# Patient Record
Sex: Male | Born: 1940 | Race: White | Hispanic: No | Smoking: Former smoker
Health system: Southern US, Community
[De-identification: ages and names within clinical notes are randomized; demographics above are authoritative.]

## PROBLEM LIST (undated history)

## (undated) DIAGNOSIS — E669 Obesity, unspecified: Secondary | ICD-10-CM

## (undated) DIAGNOSIS — E119 Type 2 diabetes mellitus without complications: Secondary | ICD-10-CM

## (undated) DIAGNOSIS — M1990 Unspecified osteoarthritis, unspecified site: Secondary | ICD-10-CM

## (undated) DIAGNOSIS — M109 Gout, unspecified: Secondary | ICD-10-CM

## (undated) DIAGNOSIS — K5792 Diverticulitis of intestine, part unspecified, without perforation or abscess without bleeding: Secondary | ICD-10-CM

## (undated) DIAGNOSIS — J189 Pneumonia, unspecified organism: Secondary | ICD-10-CM

## (undated) DIAGNOSIS — C801 Malignant (primary) neoplasm, unspecified: Secondary | ICD-10-CM

## (undated) DIAGNOSIS — I10 Essential (primary) hypertension: Secondary | ICD-10-CM

## (undated) DIAGNOSIS — C61 Malignant neoplasm of prostate: Secondary | ICD-10-CM

## (undated) DIAGNOSIS — E7800 Pure hypercholesterolemia, unspecified: Secondary | ICD-10-CM

## (undated) HISTORY — PX: KNEE ARTHROSCOPY: SHX127

## (undated) HISTORY — PX: SKIN CANCER EXCISION: SHX779

---

## 1989-06-19 HISTORY — PX: INCISION AND DRAINAGE PERIRECTAL ABSCESS: SHX1804

## 1994-10-19 HISTORY — PX: PROSTATE SURGERY: SHX751

## 2002-02-23 ENCOUNTER — Ambulatory Visit (HOSPITAL_COMMUNITY): Admission: RE | Admit: 2002-02-23 | Discharge: 2002-02-23 | Payer: Self-pay | Admitting: Specialist

## 2002-03-20 ENCOUNTER — Encounter: Admission: RE | Admit: 2002-03-20 | Discharge: 2002-04-11 | Payer: Self-pay | Admitting: Specialist

## 2002-11-20 ENCOUNTER — Ambulatory Visit (HOSPITAL_COMMUNITY): Admission: RE | Admit: 2002-11-20 | Discharge: 2002-11-20 | Payer: Self-pay | Admitting: Gastroenterology

## 2003-06-18 ENCOUNTER — Encounter: Payer: Self-pay | Admitting: General Surgery

## 2003-06-18 ENCOUNTER — Inpatient Hospital Stay (HOSPITAL_COMMUNITY): Admission: RE | Admit: 2003-06-18 | Discharge: 2003-06-19 | Payer: Self-pay | Admitting: General Surgery

## 2004-11-20 ENCOUNTER — Ambulatory Visit (HOSPITAL_COMMUNITY): Admission: RE | Admit: 2004-11-20 | Discharge: 2004-11-20 | Payer: Self-pay | Admitting: Specialist

## 2004-12-16 ENCOUNTER — Ambulatory Visit (HOSPITAL_COMMUNITY): Admission: RE | Admit: 2004-12-16 | Discharge: 2004-12-16 | Payer: Self-pay | Admitting: Specialist

## 2005-01-27 ENCOUNTER — Ambulatory Visit: Admission: RE | Admit: 2005-01-27 | Discharge: 2005-04-27 | Payer: Self-pay | Admitting: Radiation Oncology

## 2005-05-04 ENCOUNTER — Ambulatory Visit: Admission: RE | Admit: 2005-05-04 | Discharge: 2005-05-04 | Payer: Self-pay | Admitting: Radiation Oncology

## 2014-06-11 ENCOUNTER — Telehealth: Payer: Self-pay | Admitting: Gastroenterology

## 2014-06-11 ENCOUNTER — Other Ambulatory Visit: Payer: Self-pay | Admitting: Gastroenterology

## 2014-06-11 DIAGNOSIS — R109 Unspecified abdominal pain: Secondary | ICD-10-CM

## 2014-06-11 NOTE — Telephone Encounter (Signed)
Patient has been suffering from abdominal pain for 2-3 weeks and was seen by Dr. Collene Mares.  He called this morning to say that abdominal pain continues.  I instructed the patient to call Dr. Lorie Apley office upon opening which is approximately in 40 minutes

## 2014-06-12 ENCOUNTER — Ambulatory Visit
Admission: RE | Admit: 2014-06-12 | Discharge: 2014-06-12 | Disposition: A | Discharge status: Home / Self Care | Payer: Medicare HMO | Source: Ambulatory Visit | Attending: Gastroenterology | Admitting: Gastroenterology

## 2014-06-12 ENCOUNTER — Encounter (HOSPITAL_COMMUNITY): Payer: Self-pay | Admitting: Emergency Medicine

## 2014-06-12 ENCOUNTER — Emergency Department (HOSPITAL_COMMUNITY)
Admission: EM | Admit: 2014-06-12 | Discharge: 2014-06-12 | Discharge status: Against Medical Advice | Payer: Medicare HMO | Attending: Emergency Medicine | Admitting: Emergency Medicine

## 2014-06-12 DIAGNOSIS — E669 Obesity, unspecified: Secondary | ICD-10-CM | POA: Diagnosis not present

## 2014-06-12 DIAGNOSIS — R1084 Generalized abdominal pain: Secondary | ICD-10-CM | POA: Insufficient documentation

## 2014-06-12 DIAGNOSIS — Z87891 Personal history of nicotine dependence: Secondary | ICD-10-CM | POA: Insufficient documentation

## 2014-06-12 DIAGNOSIS — R109 Unspecified abdominal pain: Secondary | ICD-10-CM

## 2014-06-12 DIAGNOSIS — I10 Essential (primary) hypertension: Secondary | ICD-10-CM | POA: Diagnosis not present

## 2014-06-12 DIAGNOSIS — R6889 Other general symptoms and signs: Secondary | ICD-10-CM | POA: Insufficient documentation

## 2014-06-12 DIAGNOSIS — E119 Type 2 diabetes mellitus without complications: Secondary | ICD-10-CM | POA: Insufficient documentation

## 2014-06-12 HISTORY — DX: Obesity, unspecified: E66.9

## 2014-06-12 HISTORY — DX: Essential (primary) hypertension: I10

## 2014-06-12 HISTORY — DX: Pure hypercholesterolemia, unspecified: E78.00

## 2014-06-12 LAB — CBC WITH DIFFERENTIAL/PLATELET
Basophils Absolute: 0 10*3/uL (ref 0.0–0.1)
Basophils Relative: 0 % (ref 0–1)
Eosinophils Absolute: 0.2 10*3/uL (ref 0.0–0.7)
Eosinophils Relative: 2 % (ref 0–5)
HCT: 42.2 % (ref 39.0–52.0)
Hemoglobin: 14.8 g/dL (ref 13.0–17.0)
Lymphocytes Relative: 20 % (ref 12–46)
Lymphs Abs: 2.4 10*3/uL (ref 0.7–4.0)
MCH: 33.2 pg (ref 26.0–34.0)
MCHC: 35.1 g/dL (ref 30.0–36.0)
MCV: 94.6 fL (ref 78.0–100.0)
Monocytes Absolute: 1.3 10*3/uL — ABNORMAL HIGH (ref 0.1–1.0)
Monocytes Relative: 11 % (ref 3–12)
Neutro Abs: 8.1 10*3/uL — ABNORMAL HIGH (ref 1.7–7.7)
Neutrophils Relative %: 67 % (ref 43–77)
Platelets: 217 10*3/uL (ref 150–400)
RBC: 4.46 MIL/uL (ref 4.22–5.81)
RDW: 12.7 % (ref 11.5–15.5)
WBC: 12.1 10*3/uL — ABNORMAL HIGH (ref 4.0–10.5)

## 2014-06-12 LAB — COMPREHENSIVE METABOLIC PANEL
ALT: 20 U/L (ref 0–53)
AST: 31 U/L (ref 0–37)
Albumin: 3.5 g/dL (ref 3.5–5.2)
Alkaline Phosphatase: 96 U/L (ref 39–117)
Anion gap: 19 — ABNORMAL HIGH (ref 5–15)
BUN: 46 mg/dL — ABNORMAL HIGH (ref 6–23)
CO2: 18 mEq/L — ABNORMAL LOW (ref 19–32)
Calcium: 9.2 mg/dL (ref 8.4–10.5)
Chloride: 95 mEq/L — ABNORMAL LOW (ref 96–112)
Creatinine, Ser: 3.41 mg/dL — ABNORMAL HIGH (ref 0.50–1.35)
GFR calc Af Amer: 19 mL/min — ABNORMAL LOW (ref >90)
GFR calc non Af Amer: 16 mL/min — ABNORMAL LOW (ref >90)
Glucose, Bld: 150 mg/dL — ABNORMAL HIGH (ref 70–99)
Potassium: 4.5 mEq/L (ref 3.7–5.3)
Sodium: 132 mEq/L — ABNORMAL LOW (ref 137–147)
Total Bilirubin: 0.6 mg/dL (ref 0.3–1.2)
Total Protein: 7.5 g/dL (ref 6.0–8.3)

## 2014-06-12 LAB — LIPASE, BLOOD: Lipase: 39 U/L (ref 11–59)

## 2014-06-12 NOTE — ED Notes (Signed)
Pt reports going to GI dr today due to generalized abd pain x 1 week. Pt was sent for a ct scan but unable to complete due to elevated renal labs. Pt reports not drinking a lot of fluids over the past week but denies any n/v/d. No other complaints and no acute distress noted at triage.

## 2014-06-12 NOTE — ED Notes (Signed)
Pt states that his wife cannot drive after dark. States that he will have to leave.

## 2014-06-13 ENCOUNTER — Emergency Department (HOSPITAL_COMMUNITY): Payer: Medicare HMO

## 2014-06-13 ENCOUNTER — Observation Stay (HOSPITAL_COMMUNITY): Payer: Medicare HMO

## 2014-06-13 ENCOUNTER — Encounter (HOSPITAL_COMMUNITY): Payer: Self-pay | Admitting: Emergency Medicine

## 2014-06-13 ENCOUNTER — Observation Stay (HOSPITAL_COMMUNITY)
Admission: EM | Admit: 2014-06-13 | Discharge: 2014-06-14 | Disposition: A | Discharge status: Home / Self Care | Payer: Medicare HMO | Attending: Internal Medicine | Admitting: Internal Medicine

## 2014-06-13 DIAGNOSIS — R1030 Lower abdominal pain, unspecified: Secondary | ICD-10-CM

## 2014-06-13 DIAGNOSIS — Z8546 Personal history of malignant neoplasm of prostate: Secondary | ICD-10-CM | POA: Diagnosis not present

## 2014-06-13 DIAGNOSIS — E860 Dehydration: Secondary | ICD-10-CM | POA: Diagnosis not present

## 2014-06-13 DIAGNOSIS — G8929 Other chronic pain: Secondary | ICD-10-CM | POA: Insufficient documentation

## 2014-06-13 DIAGNOSIS — R1032 Left lower quadrant pain: Secondary | ICD-10-CM | POA: Diagnosis present

## 2014-06-13 DIAGNOSIS — M1289 Other specific arthropathies, not elsewhere classified, multiple sites: Secondary | ICD-10-CM | POA: Insufficient documentation

## 2014-06-13 DIAGNOSIS — I10 Essential (primary) hypertension: Secondary | ICD-10-CM | POA: Diagnosis not present

## 2014-06-13 DIAGNOSIS — N179 Acute kidney failure, unspecified: Principal | ICD-10-CM | POA: Insufficient documentation

## 2014-06-13 DIAGNOSIS — M109 Gout, unspecified: Secondary | ICD-10-CM | POA: Insufficient documentation

## 2014-06-13 DIAGNOSIS — E7800 Pure hypercholesterolemia, unspecified: Secondary | ICD-10-CM | POA: Insufficient documentation

## 2014-06-13 DIAGNOSIS — E119 Type 2 diabetes mellitus without complications: Secondary | ICD-10-CM | POA: Diagnosis not present

## 2014-06-13 DIAGNOSIS — E669 Obesity, unspecified: Secondary | ICD-10-CM | POA: Insufficient documentation

## 2014-06-13 DIAGNOSIS — Z8719 Personal history of other diseases of the digestive system: Secondary | ICD-10-CM | POA: Insufficient documentation

## 2014-06-13 DIAGNOSIS — R1084 Generalized abdominal pain: Secondary | ICD-10-CM | POA: Diagnosis not present

## 2014-06-13 DIAGNOSIS — M25519 Pain in unspecified shoulder: Secondary | ICD-10-CM | POA: Insufficient documentation

## 2014-06-13 DIAGNOSIS — E785 Hyperlipidemia, unspecified: Secondary | ICD-10-CM | POA: Insufficient documentation

## 2014-06-13 DIAGNOSIS — R109 Unspecified abdominal pain: Secondary | ICD-10-CM

## 2014-06-13 HISTORY — DX: Unspecified osteoarthritis, unspecified site: M19.90

## 2014-06-13 HISTORY — DX: Malignant neoplasm of prostate: C61

## 2014-06-13 HISTORY — DX: Type 2 diabetes mellitus without complications: E11.9

## 2014-06-13 HISTORY — DX: Gout, unspecified: M10.9

## 2014-06-13 HISTORY — DX: Malignant (primary) neoplasm, unspecified: C80.1

## 2014-06-13 HISTORY — DX: Pneumonia, unspecified organism: J18.9

## 2014-06-13 LAB — CBC WITH DIFFERENTIAL/PLATELET
BASOS ABS: 0 10*3/uL (ref 0.0–0.1)
BASOS PCT: 0 % (ref 0–1)
EOS PCT: 1 % (ref 0–5)
Eosinophils Absolute: 0.1 10*3/uL (ref 0.0–0.7)
HEMATOCRIT: 41.6 % (ref 39.0–52.0)
HEMOGLOBIN: 14.4 g/dL (ref 13.0–17.0)
Lymphocytes Relative: 12 % (ref 12–46)
Lymphs Abs: 1.2 10*3/uL (ref 0.7–4.0)
MCH: 32.9 pg (ref 26.0–34.0)
MCHC: 34.6 g/dL (ref 30.0–36.0)
MCV: 95 fL (ref 78.0–100.0)
MONO ABS: 1.2 10*3/uL — AB (ref 0.1–1.0)
MONOS PCT: 12 % (ref 3–12)
NEUTROS ABS: 7.6 10*3/uL (ref 1.7–7.7)
Neutrophils Relative %: 75 % (ref 43–77)
Platelets: 171 10*3/uL (ref 150–400)
RBC: 4.38 MIL/uL (ref 4.22–5.81)
RDW: 12.6 % (ref 11.5–15.5)
WBC: 10.1 10*3/uL (ref 4.0–10.5)

## 2014-06-13 LAB — URINALYSIS, ROUTINE W REFLEX MICROSCOPIC
BILIRUBIN URINE: NEGATIVE
Bilirubin Urine: NEGATIVE
GLUCOSE, UA: NEGATIVE mg/dL
Glucose, UA: NEGATIVE mg/dL
HGB URINE DIPSTICK: NEGATIVE
Hgb urine dipstick: NEGATIVE
KETONES UR: 15 mg/dL — AB
KETONES UR: 15 mg/dL — AB
LEUKOCYTES UA: NEGATIVE
Leukocytes, UA: NEGATIVE
NITRITE: NEGATIVE
Nitrite: NEGATIVE
PROTEIN: NEGATIVE mg/dL
PROTEIN: NEGATIVE mg/dL
Specific Gravity, Urine: 1.016 (ref 1.005–1.030)
Specific Gravity, Urine: 1.016 (ref 1.005–1.030)
UROBILINOGEN UA: 0.2 mg/dL (ref 0.0–1.0)
Urobilinogen, UA: 0.2 mg/dL (ref 0.0–1.0)
pH: 5 (ref 5.0–8.0)
pH: 5 (ref 5.0–8.0)

## 2014-06-13 LAB — GLUCOSE, CAPILLARY
GLUCOSE-CAPILLARY: 174 mg/dL — AB (ref 70–99)
Glucose-Capillary: 117 mg/dL — ABNORMAL HIGH (ref 70–99)
Glucose-Capillary: 142 mg/dL — ABNORMAL HIGH (ref 70–99)

## 2014-06-13 LAB — LIPID PANEL
Cholesterol: 165 mg/dL (ref 0–200)
HDL: 52 mg/dL (ref >39)
LDL CALC: 83 mg/dL (ref 0–99)
Total CHOL/HDL Ratio: 3.2 RATIO
Triglycerides: 148 mg/dL (ref <150)
VLDL: 30 mg/dL (ref 0–40)

## 2014-06-13 LAB — CREATININE, SERUM
Creatinine, Ser: 1.81 mg/dL — ABNORMAL HIGH (ref 0.50–1.35)
GFR calc Af Amer: 41 mL/min — ABNORMAL LOW (ref >90)
GFR calc non Af Amer: 35 mL/min — ABNORMAL LOW (ref >90)

## 2014-06-13 LAB — CBC
HEMATOCRIT: 42.5 % (ref 39.0–52.0)
Hemoglobin: 14.4 g/dL (ref 13.0–17.0)
MCH: 33.1 pg (ref 26.0–34.0)
MCHC: 33.9 g/dL (ref 30.0–36.0)
MCV: 97.7 fL (ref 78.0–100.0)
Platelets: 183 10*3/uL (ref 150–400)
RBC: 4.35 MIL/uL (ref 4.22–5.81)
RDW: 12.7 % (ref 11.5–15.5)
WBC: 10.4 10*3/uL (ref 4.0–10.5)

## 2014-06-13 LAB — COMPREHENSIVE METABOLIC PANEL
ALBUMIN: 3.2 g/dL — AB (ref 3.5–5.2)
ALT: 18 U/L (ref 0–53)
ANION GAP: 18 — AB (ref 5–15)
AST: 21 U/L (ref 0–37)
Alkaline Phosphatase: 97 U/L (ref 39–117)
BILIRUBIN TOTAL: 0.8 mg/dL (ref 0.3–1.2)
BUN: 43 mg/dL — AB (ref 6–23)
CALCIUM: 8.9 mg/dL (ref 8.4–10.5)
CHLORIDE: 97 meq/L (ref 96–112)
CO2: 18 meq/L — AB (ref 19–32)
CREATININE: 2.28 mg/dL — AB (ref 0.50–1.35)
GFR calc Af Amer: 32 mL/min — ABNORMAL LOW (ref >90)
GFR, EST NON AFRICAN AMERICAN: 27 mL/min — AB (ref >90)
Glucose, Bld: 133 mg/dL — ABNORMAL HIGH (ref 70–99)
Potassium: 4.3 mEq/L (ref 3.7–5.3)
Sodium: 133 mEq/L — ABNORMAL LOW (ref 137–147)
Total Protein: 7 g/dL (ref 6.0–8.3)

## 2014-06-13 LAB — LIPASE, BLOOD: LIPASE: 117 U/L — AB (ref 11–59)

## 2014-06-13 LAB — I-STAT TROPONIN, ED: TROPONIN I, POC: 0.03 ng/mL (ref 0.00–0.08)

## 2014-06-13 LAB — SODIUM, URINE, RANDOM: SODIUM UR: 92 meq/L

## 2014-06-13 LAB — I-STAT CG4 LACTIC ACID, ED: LACTIC ACID, VENOUS: 1.32 mmol/L (ref 0.5–2.2)

## 2014-06-13 MED ORDER — ACETAMINOPHEN 325 MG PO TABS
650.0000 mg | ORAL_TABLET | Freq: Four times a day (QID) | ORAL | Status: DC | PRN
Start: 2014-06-13 — End: 2014-06-14

## 2014-06-13 MED ORDER — HYDROCODONE-ACETAMINOPHEN 5-325 MG PO TABS
1.0000 | ORAL_TABLET | ORAL | Status: DC | PRN
  Administered 2014-06-13 – 2014-06-14 (×3): 1 via ORAL
  Filled 2014-06-13 (×3): qty 1

## 2014-06-13 MED ORDER — ENOXAPARIN SODIUM 30 MG/0.3ML ~~LOC~~ SOLN
30.0000 mg | SUBCUTANEOUS | Status: DC
  Administered 2014-06-13: 30 mg via SUBCUTANEOUS
  Filled 2014-06-13 (×2): qty 0.3

## 2014-06-13 MED ORDER — POLYETHYLENE GLYCOL 3350 17 G PO PACK
17.0000 g | PACK | Freq: Every day | ORAL | Status: DC | PRN
  Filled 2014-06-13: qty 1

## 2014-06-13 MED ORDER — ONDANSETRON HCL 4 MG/2ML IJ SOLN: 4.0000 mg | Freq: Four times a day (QID) | INTRAMUSCULAR | Status: DC | PRN

## 2014-06-13 MED ORDER — HYDROMORPHONE HCL PF 1 MG/ML IJ SOLN
1.0000 mg | INTRAMUSCULAR | Status: AC | PRN
  Filled 2014-06-13: qty 1

## 2014-06-13 MED ORDER — MORPHINE SULFATE 4 MG/ML IJ SOLN
4.0000 mg | Freq: Once | INTRAMUSCULAR | Status: AC
  Administered 2014-06-13: 4 mg via INTRAVENOUS
  Filled 2014-06-13: qty 1

## 2014-06-13 MED ORDER — ALUM & MAG HYDROXIDE-SIMETH 200-200-20 MG/5ML PO SUSP: 30.0000 mL | Freq: Four times a day (QID) | ORAL | Status: DC | PRN

## 2014-06-13 MED ORDER — SENNOSIDES-DOCUSATE SODIUM 8.6-50 MG PO TABS
1.0000 | ORAL_TABLET | Freq: Every day | ORAL | Status: DC
  Administered 2014-06-13: 1 via ORAL
  Filled 2014-06-13: qty 1

## 2014-06-13 MED ORDER — PANTOPRAZOLE SODIUM 40 MG PO TBEC
40.0000 mg | DELAYED_RELEASE_TABLET | Freq: Every day | ORAL | Status: DC
  Administered 2014-06-13 – 2014-06-14 (×2): 40 mg via ORAL
  Filled 2014-06-13 (×2): qty 1

## 2014-06-13 MED ORDER — INSULIN ASPART 100 UNIT/ML ~~LOC~~ SOLN
0.0000 [IU] | Freq: Three times a day (TID) | SUBCUTANEOUS | Status: DC
  Administered 2014-06-14: 1 [IU] via SUBCUTANEOUS
  Administered 2014-06-14: 3 [IU] via SUBCUTANEOUS

## 2014-06-13 MED ORDER — ONDANSETRON HCL 4 MG/2ML IJ SOLN
4.0000 mg | Freq: Once | INTRAMUSCULAR | Status: AC
  Administered 2014-06-13: 4 mg via INTRAVENOUS
  Filled 2014-06-13: qty 2

## 2014-06-13 MED ORDER — INSULIN ASPART 100 UNIT/ML ~~LOC~~ SOLN: 0.0000 [IU] | Freq: Every day | SUBCUTANEOUS | Status: DC

## 2014-06-13 MED ORDER — SODIUM CHLORIDE 0.9 % IV BOLUS (SEPSIS)
1000.0000 mL | Freq: Once | INTRAVENOUS | Status: AC
  Administered 2014-06-13: 1000 mL via INTRAVENOUS

## 2014-06-13 MED ORDER — HYDROMORPHONE HCL PF 1 MG/ML IJ SOLN
1.0000 mg | INTRAMUSCULAR | Status: DC | PRN
  Administered 2014-06-13: 1 mg via INTRAVENOUS

## 2014-06-13 MED ORDER — ONDANSETRON HCL 4 MG PO TABS: 4.0000 mg | ORAL_TABLET | Freq: Four times a day (QID) | ORAL | Status: DC | PRN

## 2014-06-13 MED ORDER — SODIUM CHLORIDE 0.9 % IV SOLN
INTRAVENOUS | Status: DC
  Administered 2014-06-13: 100 mL/h via INTRAVENOUS
  Administered 2014-06-14: 03:00:00 via INTRAVENOUS

## 2014-06-13 MED ORDER — ONDANSETRON HCL 4 MG/2ML IJ SOLN: 4.0000 mg | Freq: Three times a day (TID) | INTRAMUSCULAR | Status: AC | PRN

## 2014-06-13 MED ORDER — ACETAMINOPHEN 650 MG RE SUPP: 650.0000 mg | Freq: Four times a day (QID) | RECTAL | Status: DC | PRN

## 2014-06-13 NOTE — Consult Note (Signed)
Reason for Consult: Abdominal pain. Referring Physician: [facility]-Dr. Danton Palmateer is an 73 y.o. male.  HPI: 73 year old, white male, with multiple medical issues listed below, admitted for abnormal renal function noted incidentally when a BUN/Creatinine were checked before his scheduled CT scan yesterday. He claims he has had crampy pain in the suprapubic region for several weeks now with a significantly decreased appetite. He claims he has lost 15 lbs during this time. He is unable to describe any specific relieving or aggravating factors with regards to the pain. A non-contrasted CT scan done today reveals no acute abnormalities. 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 His last colonoscopy done in September 2014 revealed extensive pandiverticulosis.    Past Medical History  Diagnosis Date  . Obesity   . High cholesterol   . Hypertension   . Pneumonia 2014 X 1  . Type II diabetes mellitus   . Arthritis     "all my joints" (06/13/2014)  . Gout   . Prostate cancer   . 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 Prostate surgery  1996  . Incision and drainage perirectal abscess  1990's    "hemorrhoid"  . Knee arthroscopy Right ~ 2011  . Skin cancer excision  ~ 2005 X 2    "left foot"   History reviewed. No pertinent family history.  Social History:  reports that he quit smoking about 26 years ago. His smoking use included Cigarettes. He has a 48 pack-year smoking history. He has never used smokeless tobacco. He reports that he drinks about 16.8 ounces of alcohol per week. He reports that he does not use illicit drugs.  Allergies: No Known Allergies  Medications: I have reviewed the patient's current medications.  Results for orders placed during the hospital encounter of 06/13/14 (from the past 48 hour(s))  CBC WITH DIFFERENTIAL     Status: Abnormal   Collection Time     06/13/14 11:05 AM      Result Value Ref Range   WBC 10.1  4.0 - 10.5 K/uL   RBC 4.38  4.22 - 5.81 MIL/uL   Hemoglobin 14.4  13.0 - 17.0 g/dL   HCT 41.6  39.0 - 52.0 %   MCV 95.0  78.0 - 100.0 fL   MCH 32.9  26.0 - 34.0 pg   MCHC 34.6  30.0 - 36.0 g/dL   RDW 12.6  11.5 - 15.5 %   Platelets 171  150 - 400 K/uL   Neutrophils Relative % 75  43 - 77 %   Neutro Abs 7.6  1.7 - 7.7 K/uL   Lymphocytes Relative 12  12 - 46 %   Lymphs Abs 1.2  0.7 - 4.0 K/uL   Monocytes Relative 12  3 - 12 %   Monocytes Absolute 1.2 (*) 0.1 - 1.0 K/uL   Eosinophils Relative 1  0 - 5 %   Eosinophils Absolute 0.1  0.0 - 0.7 K/uL   Basophils Relative 0  0 - 1 %   Basophils Absolute 0.0  0.0 - 0.1 K/uL  COMPREHENSIVE METABOLIC PANEL     Status: Abnormal   Collection Time    06/13/14 11:05 AM      Result Value Ref Range   Sodium 133 (*) 137 - 147 mEq/L   Potassium 4.3  3.7 - 5.3 mEq/L   Chloride 97  96 - 112  mEq/L   CO2 18 (*) 19 - 32 mEq/L   Glucose, Bld 133 (*) 70 - 99 mg/dL   BUN 43 (*) 6 - 23 mg/dL   Creatinine, Ser 2.28 (*) 0.50 - 1.35 mg/dL   Calcium 8.9  8.4 - 10.5 mg/dL   Total Protein 7.0  6.0 - 8.3 g/dL   Albumin 3.2 (*) 3.5 - 5.2 g/dL   AST 21  0 - 37 U/L   ALT 18  0 - 53 U/L   Alkaline Phosphatase 97  39 - 117 U/L   Total Bilirubin 0.8  0.3 - 1.2 mg/dL   GFR calc non Af Amer 27 (*) >90 mL/min   GFR calc Af Amer 32 (*) >90 mL/min   Comment: (NOTE)     The eGFR has been calculated using the CKD EPI equation.     This calculation has not been validated in all clinical situations.     eGFR's persistently <90 mL/min signify possible Chronic Kidney     Disease.   Anion gap 18 (*) 5 - 15  I-STAT TROPOININ, ED     Status: None   Collection Time    06/13/14 11:10 AM      Result Value Ref Range   Troponin i, poc 0.03  0.00 - 0.08 ng/mL   Comment 3            Comment: Due to the release kinetics of cTnI,     a negative result within the first hours     of the onset of symptoms does not rule  out     myocardial infarction with certainty.     If myocardial infarction is still suspected,     repeat the test at appropriate intervals.  I-STAT CG4 LACTIC ACID, ED     Status: None   Collection Time    06/13/14 11:12 AM      Result Value Ref Range   Lactic Acid, Venous 1.32  0.5 - 2.2 mmol/L  URINALYSIS, ROUTINE W REFLEX MICROSCOPIC     Status: Abnormal   Collection Time    06/13/14 12:51 PM      Result Value Ref Range   Color, Urine AMBER (*) YELLOW   Comment: BIOCHEMICALS MAY BE AFFECTED BY COLOR   APPearance CLEAR  CLEAR   Specific Gravity, Urine 1.016  1.005 - 1.030   pH 5.0  5.0 - 8.0   Glucose, UA NEGATIVE  NEGATIVE mg/dL   Hgb urine dipstick NEGATIVE  NEGATIVE   Bilirubin Urine NEGATIVE  NEGATIVE   Ketones, ur 15 (*) NEGATIVE mg/dL   Protein, ur NEGATIVE  NEGATIVE mg/dL   Urobilinogen, UA 0.2  0.0 - 1.0 mg/dL   Nitrite NEGATIVE  NEGATIVE   Leukocytes, UA NEGATIVE  NEGATIVE   Comment: MICROSCOPIC NOT DONE ON URINES WITH NEGATIVE PROTEIN, BLOOD, LEUKOCYTES, NITRITE, OR GLUCOSE <1000 mg/dL.  SODIUM, URINE, RANDOM     Status: None   Collection Time    06/13/14 12:51 PM      Result Value Ref Range   Sodium, Ur 92    LIPASE, BLOOD     Status: Abnormal   Collection Time    06/13/14  1:01 PM      Result Value Ref Range   Lipase 117 (*) 11 - 59 U/L  GLUCOSE, CAPILLARY     Status: Abnormal   Collection Time    06/13/14  3:22 PM      Result Value Ref Range   Glucose-Capillary 117 (*)  70 - 99 mg/dL  CBC     Status: None   Collection Time    06/13/14  4:37 PM      Result Value Ref Range   WBC 10.4  4.0 - 10.5 K/uL   RBC 4.35  4.22 - 5.81 MIL/uL   Hemoglobin 14.4  13.0 - 17.0 g/dL   HCT 42.5  39.0 - 52.0 %   MCV 97.7  78.0 - 100.0 fL   MCH 33.1  26.0 - 34.0 pg   MCHC 33.9  30.0 - 36.0 g/dL   RDW 12.7  11.5 - 15.5 %   Platelets 183  150 - 400 K/uL  CREATININE, SERUM     Status: Abnormal   Collection Time    06/13/14  4:37 PM      Result Value Ref Range    Creatinine, Ser 1.81 (*) 0.50 - 1.35 mg/dL   GFR calc non Af Amer 35 (*) >90 mL/min   GFR calc Af Amer 41 (*) >90 mL/min   Comment: (NOTE)     The eGFR has been calculated using the CKD EPI equation.     This calculation has not been validated in all clinical situations.     eGFR's persistently <90 mL/min signify possible Chronic Kidney     Disease.  LIPID PANEL     Status: None   Collection Time    06/13/14  4:48 PM      Result Value Ref Range   Cholesterol 165  0 - 200 mg/dL   Triglycerides 148  <150 mg/dL   HDL 52  >39 mg/dL   Total CHOL/HDL Ratio 3.2     VLDL 30  0 - 40 mg/dL   LDL Cholesterol 83  0 - 99 mg/dL   Comment:            Total Cholesterol/HDL:CHD Risk     Coronary Heart Disease Risk Table                         Men   Women      1/2 Average Risk   3.4   3.3      Average Risk       5.0   4.4      2 X Average Risk   9.6   7.1      3 X Average Risk  23.4   11.0                Use the calculated Patient Ratio     above and the CHD Risk Table     to determine the patient's CHD Risk.                ATP III CLASSIFICATION (LDL):      <100     mg/dL   Optimal      100-129  mg/dL   Near or Above                        Optimal      130-159  mg/dL   Borderline      160-189  mg/dL   High      >190     mg/dL   Very High  GLUCOSE, CAPILLARY     Status: Abnormal   Collection Time    06/13/14  5:33 PM      Result Value Ref Range   Glucose-Capillary 142 (*) 70 - 99  mg/dL  URINALYSIS, ROUTINE W REFLEX MICROSCOPIC     Status: Abnormal   Collection Time    06/13/14  5:42 PM      Result Value Ref Range   Color, Urine YELLOW  YELLOW   APPearance CLEAR  CLEAR   Specific Gravity, Urine 1.016  1.005 - 1.030   pH 5.0  5.0 - 8.0   Glucose, UA NEGATIVE  NEGATIVE mg/dL   Hgb urine dipstick NEGATIVE  NEGATIVE   Bilirubin Urine NEGATIVE  NEGATIVE   Ketones, ur 15 (*) NEGATIVE mg/dL   Protein, ur NEGATIVE  NEGATIVE mg/dL   Urobilinogen, UA 0.2  0.0 - 1.0 mg/dL   Nitrite  NEGATIVE  NEGATIVE   Leukocytes, UA NEGATIVE  NEGATIVE   Comment: MICROSCOPIC NOT DONE ON URINES WITH NEGATIVE PROTEIN, BLOOD, LEUKOCYTES, NITRITE, OR GLUCOSE <1000 mg/dL.   Ct Abdomen Pelvis Wo Contrast  06/13/2014   CLINICAL DATA:  Abdominal pain for few weeks. Prostate cancer. Abdominal aortic aneurysm.  EXAM: CT ABDOMEN AND PELVIS WITHOUT CONTRAST  TECHNIQUE: Multidetector CT imaging of the abdomen and pelvis was performed following the standard protocol without IV contrast.  COMPARISON:  None.  FINDINGS: Musculoskeletal: No aggressive osseous lesions. Mild thoracic spondylosis.  Lung Bases: Atelectasis.  Liver: Unenhanced CT was performed per clinician order. Lack of IV contrast limits sensitivity and specificity, especially for evaluation of abdominal/pelvic solid viscera. Normal.  Spleen:  Normal.  Gallbladder:  Dependent biliary sludge.  No calcified stones.  Common bile duct:  Normal.  Pancreas:  Normal.  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  Kidneys: Bilateral renal cortical atrophy, likely representing medical renal disease. Interpolar simple LEFT renal cyst. This measures about 14 mm. No calculi. Renal vascular calcifications. Atherosclerosis. Both ureters appear within normal limits.  Stomach:  Normal.  Small bowel: Duodenum is within normal limits. There is no small bowel obstruction. Mild nonspecific stranding is present in the small bowel mesentery. Radiopaque density is present in the terminal ileum, likely representing residual barium contrast most of which has passed into the colon.  Colon: Normal appendix. Severe colonic diverticulosis. No diverticulitis.  Pelvic Genitourinary: Urinary bladder appears normal. Prostatectomy. No pelvic adenopathy.  Vasculature: Atherosclerosis. Coronary artery atherosclerosis is present. If office based assessment of coronary risk factors has not been performed, it is now recommended.  Fatty infiltration of the interatrial septum compatible with lipomatous hypertrophy of the interatrial septum. Abdominal aortic and visceral atherosclerosis without aneurysm.  Body Wall: Tiny fat containing periumbilical hernia.  IMPRESSION: 1. No acute abnormality. 2. Atherosclerosis and coronary artery disease. 3. Prostatectomy.   Electronically Signed   By: Dereck Ligas M.D.   On: 06/13/2014 11:50   US Renal  06/13/2014   CLINICAL DATA:  Renal insufficiency.  EXAM: RENAL/URINARY TRACT ULTRASOUND COMPLETE  COMPARISON:  CT scan from 06/13/2014.  FINDINGS: Right Kidney:  Length: 10.8 cm.  Cortical thinning.  No hydronephrosis.  Left Kidney:  Length: 13.2 cm. Cortical thinning. No hydronephrosis. 12 mm cyst identified in the interpolar region.  Bladder:  Nondistended bladder.  IMPRESSION: No evidence for hydronephrosis.   Electronically Signed   By: Misty Stanley M.D.   On: 06/13/2014 18:41   Dg Shoulder Left  06/13/2014   CLINICAL DATA:  Shoulder pain with no history of injury.  EXAM: LEFT SHOULDER - 2+ VIEW  COMPARISON:  None.  FINDINGS: Patient could not position for an axillary view. No evidence for fracture or dislocation on  this two view study. Acromioclavicular and coracoclavicular distances are preserved. Degenerative changes are noted at the [facility] joint. No worrisome lytic or sclerotic osseous abnormality.  IMPRESSION: AC joint degeneration.   Electronically Signed   By: Misty Stanley M.D.   On: 06/13/2014 13:53   Review of Systems  Constitutional: Positive for weight loss.  HENT: Negative.   Eyes: Negative.   Respiratory: Negative.   Cardiovascular: Negative.   Gastrointestinal: Positive for abdominal pain. Negative for heartburn, nausea, vomiting, diarrhea, constipation, blood in stool and melena.  Musculoskeletal: Positive for back pain.  Skin: Negative.   Neurological: Negative.   Endo/Heme/Allergies: Negative.   Psychiatric/Behavioral: Negative.    Blood pressure 156/73, pulse 83,  temperature 98 F (36.7 C), temperature source Oral, resp. rate 20, height 6' (1.829 m), weight 117.5 kg (259 lb 0.7 oz), SpO2 99.00%. Physical Exam  Constitutional: He is oriented to person, place, and time. He appears well-developed and well-nourished.  HENT:  Head: Normocephalic and atraumatic.  Eyes: Conjunctivae and EOM are normal. Pupils are equal, round, and reactive to light.  Neck: Normal range of motion. Neck supple.  Cardiovascular: Normal rate and regular rhythm.   Respiratory: Effort normal.  GI: Soft. Bowel sounds are normal. There is no hepatosplenomegaly. There is tenderness in the suprapubic area. There is no rigidity, no rebound, no guarding and no CVA tenderness.  Musculoskeletal: Normal range of motion.  Neurological: He is alert and oriented to person, place, and time.  Skin: Skin is warm and dry.  Psychiatric: He has a normal mood and affect. His behavior is normal. Judgment and thought content normal.   Assessment/Plan: 1) Abdominal pain with weight loss: I am not sure what is causing the pain in this case-he has extensive colonic diverticulosis with any evidence of diverticulitis. This may be mesenteric ischemia but with his present renal function we cannot do a mesenteric angiogram.   Aaryn Parrilla 06/13/2014, 6:56 PM

## 2014-06-13 NOTE — ED Notes (Signed)
Pt states that he has had mid abdominal pain and bilateral flank pain for 2 weeks. Pt was sent by dr Collene Mares for elevated kidney functions. Pt denies any changes in urination.

## 2014-06-13 NOTE — ED Provider Notes (Signed)
CSN: [id number]     Arrival date & time 06/13/14  1001 History   First MD Initiated Contact with Patient 06/13/14 1022     Chief Complaint  Patient presents with  . Abdominal Pain  . Abnormal Lab     (Consider location/radiation/quality/duration/timing/severity/associated sxs/prior Treatment) HPI  73 year old obese male with history of hypertension, high cholesterol, and non-insulin-dependent diabetes who presents complaining of abdominal pain and abnormal labs. History obtained through patient. He reports for the past one month he has been having persistent lower bowel pain. Describe pain as a sharp crampy sensation, nothing seems to make it better or worse. Pain has been progressive and now radiates to his bilateral flank for the past 2 weeks. Yesterday the pain did radiates to his left shoulder although he denies injuring his shoulder. He reports occasional nausea but none recently. He admits to having no appetite and hasn't been drinking or eating as usual. However denies eating and drinking worsen his abdominal pain. He was seen by his primary care doctor 3 weeks ago for a checkup at that time it was noted that his renal function was elevated, he was told to return in 3 months for a recheck. He has a known history of prostate cancer with a prostatectomy seizure done in 1997. He endorsed 15 pounds of weight loss within the past several months but it had to not eating and drinking. He was seen by his GI specialist last week for a checkup.  Patient was in the ED yesterday for the same complaint but has to leave early prior to being seen by a provider. His labs at that time is remarkable for a BUN of 46, creatinine of 3.41 concerning for kidney injury. He reports still urinating without any difficulty. Currently denies any fever, chills, headache, chest pain, shortness of breath, lightheadedness, dizziness, diaphoresis, dysuria, hematuria numbness, or rash. Patient is a former smoker.  Past Medical  History  Diagnosis Date  . Obesity   . High cholesterol   . Hypertension   . Diabetes mellitus without complication    History reviewed. No pertinent past surgical history. History reviewed. No pertinent family history. History  Substance Use Topics  . Smoking status: Former Research scientist (life sciences)  . Smokeless tobacco: Not on file  . Alcohol Use: Yes     Comment: occ    Review of Systems  All other systems reviewed and are negative.     Allergies  Review of patient's allergies indicates no known allergies.  Home Medications   Prior to Admission medications   Not on File   BP 104/60  Pulse 89  Temp(Src) 97.5 F (36.4 C) (Oral)  Resp 22  SpO2 98% Physical Exam  Constitutional: He is oriented to person, place, and time. He appears well-developed and well-nourished. No distress.  Moderately obese Caucasian male appears stated age, in no obvious distress.  HENT:  Head: Atraumatic.  Mouth/Throat: Oropharynx is clear and moist.  Eyes: Conjunctivae are normal.  Neck: Normal range of motion. Neck supple.  Cardiovascular: Normal rate, regular rhythm and intact distal pulses.   Pulmonary/Chest: Effort normal and breath sounds normal.  Abdominal: Soft. There is tenderness (Suprapubic tenderness on palpation without guarding or rebound tenderness.).  Genitourinary:  No CVA tenderness  Musculoskeletal:  5/5 Strength in all extremities with intact distal pulses  Neurological: He is alert and oriented to person, place, and time.  Skin: No rash noted.  Psychiatric: He has a normal mood and affect.    ED Course  Angiocath  insertion Date/Time: 06/13/2014 1:04 PM Performed by: Domenic Moras Authorized by: Domenic Moras Consent: Verbal consent obtained. Risks and benefits: risks, benefits and alternatives were discussed Consent given by: patient Patient understanding: patient states understanding of the procedure being performed Site marked: the operative site was marked Patient identity  confirmed: verbally with patient and arm band Time out: Immediately prior to procedure a "time out" was called to verify the correct patient, procedure, equipment, support staff and site/side marked as required. Preparation: Patient was prepped and draped in the usual sterile fashion. Local anesthesia used: no Patient sedated: no Patient tolerance: Patient tolerated the procedure well with no immediate complications. Comments: Insertion of 18 gauge angiocath to L brachial region for IVF using US guided.    (including critical care time)  10:45 AM Pt with abd pain, evidence of renal injury.  Plan for further evaluation and likely admission.  Care discussed with Dr. Darl Householder.  Dr. Darl Householder performed bedside US to evaluate for AAA but difficult to assess due to large body habitus.   12:24 PM BUN 43, Cr 2.28, with anion gap of 18.  Abd/pelvis CT without acute finding.  No evidence of AAA.  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  Plan for admission due to renal insufficiency and further evaluation for abd pain.  Care discussed with Dr. Darl Householder.    12:33 PM i have consulted with [facility], Dr. Tana Coast who agrees to admit pt to obs, team 10, tele, under her care.  Pt made aware of plan.    1:02 PM Pt's IV infiltrates.  I performed US guided peripheral IV under the supervision of Dr. Darl Householder.      Labs Review Labs Reviewed  CBC WITH DIFFERENTIAL - Abnormal; Notable for the following:    Monocytes Absolute 1.2 (*)    All other components within normal limits  COMPREHENSIVE METABOLIC PANEL - Abnormal; Notable for the following:    Sodium 133 (*)    CO2 18 (*)    Glucose, Bld 133 (*)    BUN 43 (*)    Creatinine, Ser 2.28 (*)    Albumin 3.2 (*)    GFR calc non Af Amer 27 (*)    GFR calc Af Amer 32 (*)    Anion gap 18 (*)    All other components within normal limits  URINALYSIS, ROUTINE W REFLEX MICROSCOPIC  I-STAT CG4 LACTIC ACID, ED  I-STAT TROPOININ, ED    Imaging Review Ct Abdomen Pelvis Wo  Contrast  06/13/2014   CLINICAL DATA:  Abdominal pain for few weeks. Prostate cancer. Abdominal aortic aneurysm.  EXAM: CT ABDOMEN AND PELVIS WITHOUT CONTRAST  TECHNIQUE: Multidetector CT imaging of the abdomen and pelvis was performed following the standard protocol without IV contrast.  COMPARISON:  None.  FINDINGS: Musculoskeletal: No aggressive osseous lesions. Mild thoracic spondylosis.  Lung Bases: Atelectasis.  Liver: Unenhanced CT was performed per clinician order. Lack of IV contrast limits sensitivity and specificity, especially for evaluation of abdominal/pelvic solid viscera. Normal.  Spleen:  Normal.  Gallbladder:  Dependent biliary sludge.  No calcified stones.  Common bile duct:  Normal.  Pancreas:  Normal.  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  Kidneys: Bilateral renal cortical atrophy, likely representing medical renal disease. Interpolar simple LEFT renal cyst. This measures about 14 mm. No calculi. Renal vascular calcifications. Atherosclerosis. Both ureters appear within normal limits.  Stomach:  Normal.  Small bowel: Duodenum is within normal limits.  There is no small bowel obstruction. Mild nonspecific stranding is present in the small bowel mesentery. Radiopaque density is present in the terminal ileum, likely representing residual barium contrast most of which has passed into the colon.  Colon: Normal appendix. Severe colonic diverticulosis. No diverticulitis.  Pelvic Genitourinary: Urinary bladder appears normal. Prostatectomy. No pelvic adenopathy.  Vasculature: Atherosclerosis. Coronary artery atherosclerosis is present. If office based assessment of coronary risk factors has not been performed, it is now recommended. Fatty infiltration of the interatrial septum compatible with lipomatous hypertrophy of the interatrial septum. Abdominal aortic and visceral atherosclerosis without aneurysm.  Body Wall: Tiny fat  containing periumbilical hernia.  IMPRESSION: 1. No acute abnormality. 2. Atherosclerosis and coronary artery disease. 3. Prostatectomy.   Electronically Signed   By: Dereck Ligas M.D.   On: 06/13/2014 11:50     EKG Interpretation   Date/Time:  Wednesday June 13 2014 10:47:00 EDT Ventricular Rate:  86 PR Interval:  171 QRS Duration: 99 QT Interval:  375 QTC Calculation: 448 R Axis:   -75 Text Interpretation:  Sinus rhythm Atrial premature complex LAD, consider  left anterior fascicular block Low voltage, precordial leads Abnormal  R-wave progression, early transition Borderline T abnormalities, anterior  leads LAFB new since previous  Confirmed by YAO  MD, DAVID ([id number]) on  06/13/2014 10:49:59 AM      MDM   Final diagnoses:  Acute renal injury  Lower abdominal pain    BP 104/60  Pulse 89  Temp(Src) 97.5 F (36.4 C) (Oral)  Resp 22  SpO2 98%  I have reviewed nursing notes and vital signs. I personally reviewed the imaging tests through PACS system  I reviewed available ER/hospitalization records thought the EMR     Domenic Moras, PA-C 06/13/14 [place], PA-C 06/14/14 [id number]

## 2014-06-13 NOTE — ED Notes (Signed)
MD at bedside attempting ultrasound IV

## 2014-06-13 NOTE — ED Notes (Signed)
Lab results given to Dr.Yao. 

## 2014-06-13 NOTE — Progress Notes (Signed)
Adarius Tigges [id number] Admission Data: 06/13/2014 4:28 PM Attending Provider: Mendel Corning, MD  JGO:[id number],MBTDH A, MD Consults/ Treatment Team:    Peder Allums is a 73 y.o. male patient admitted from ED awake, alert  & orientated  X 3,  Full Code, VSS - Blood pressure 109/59, pulse 72, temperature 98.7 F (37.1 C), temperature source Oral, resp. rate 18, height 6' (1.829 m), weight 117.5 kg (259 lb 0.7 oz), SpO2 99.00%., no c/o shortness of breath, no c/o chest pain, no distress noted. Tele #11.   IV site WDL:  Left A/C running NS at 166ml/hr.  Allergies:  No Known Allergies   Past Medical History  Diagnosis Date  . Obesity   . High cholesterol   . Hypertension   . Pneumonia 2014 X 1  . Type II diabetes mellitus   . Arthritis     "all my joints" (06/13/2014)  . Gout   . Prostate cancer   . 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 Information packet given to patient/family and safety video watched.  Admission INP armband ID verified with patient/family, and in place. SR up x 2, fall risk assessment complete with Patient and family verbalizing understanding of risks associated with falls. Pt verbalizes an understanding of how to use the call bell and to call for help before getting out of bed.  Skin, clean-dry- intact without evidence of bruising, or skin tears.   No evidence of skin break down noted on exam.     Will cont to monitor and assist as needed.  Dayle Points, RN 06/13/2014 4:28 PM

## 2014-06-13 NOTE — ED Notes (Signed)
Patient transported to X-ray 

## 2014-06-13 NOTE — H&P (Signed)
History and Physical       Hospital Admission Note Date: 06/13/2014  Patient name: Ian Estes Medical record number: [id number] Date of birth: 1940-12-20 Age: 73 y.o. Gender: male  PCP: Stephens Shire, MD  Primary gastroenterologist: Dr. Collene Mares  Chief Complaint:  Abdominal pain for last 2 weeks  HPI: Patient is a 73 year old male with history of diabetes, hypertension, hyperlipidemia, presented to ED with abdominal pain for last 2 weeks. The patient reports that abdominal pain is generalized, worse in periumbilical area and left lower quadrant, he has a history of prior diverticulitis. Patient denies any nausea, vomiting, diarrhea, fevers or chills. Patient reports that he saw his gastroenterologist, Dr. Collene Mares. CT abdomen and pelvis was done in ED which showed no acute abnormality. At the time of my encounter, patient reported abdominal pain had improved with morphine given in the ED. He reports a 15 pound weight loss in the last 6 month as he has not been eating "like he used to be". Patient denies any dysuria, hematuria or any flank pain The patient also endorses having left shoulder pain since morning.  Review of Systems:  Constitutional: Denies fever, chills, diaphoresis, poor appetite and fatigue.  HEENT: Denies photophobia, eye pain, redness, hearing loss, ear pain, congestion, sore throat, rhinorrhea, sneezing, mouth sores, trouble swallowing, neck pain, neck stiffness and tinnitus.   Respiratory: Denies SOB, DOE, cough, chest tightness,  and wheezing.   Cardiovascular: Denies chest pain, palpitations and leg swelling.  Gastrointestinal: Denies nausea, vomiting, + abdominal pain, denies diarrhea, constipation, blood in stool and abdominal distention.  Genitourinary: Denies dysuria, urgency, frequency, hematuria, flank pain and difficulty urinating.  Musculoskeletal: Denies myalgias, back pain, joint swelling, arthralgias and gait  problem.  Skin: Denies pallor, rash and wound.  Neurological: Denies dizziness, seizures, syncope, weakness, light-headedness, numbness and headaches.  Hematological: Denies adenopathy. Easy bruising, personal or family bleeding history  Psychiatric/Behavioral: Denies suicidal ideation, mood changes, confusion, nervousness, sleep disturbance and agitation  Past Medical History: Past Medical History  Diagnosis Date  . Obesity   . High cholesterol   . Hypertension   . Diabetes mellitus without complication    History reviewed. No pertinent past surgical history.  Medications: Prior to Admission medications   Not on File    Allergies:  No Known Allergies  Social History:  reports that he has quit smoking. He does not have any smokeless tobacco history on file. He reports that he drinks alcohol. He reports that he does not use illicit drugs.  Family History: History reviewed. No pertinent family history.  Physical Exam: Blood pressure 104/60, pulse 89, temperature 97.5 F (36.4 C), temperature source Oral, resp. rate 22, SpO2 98.00%. General: Alert, awake, oriented x3, in no acute distress. HEENT: normocephalic, atraumatic, anicteric sclera, pink conjunctiva, pupils equal and reactive to light and accomodation, oropharynx clear Neck: supple, no masses or lymphadenopathy, no goiter, no bruits  Heart: Regular rate and rhythm, without murmurs, rubs or gallops. Lungs: Clear to auscultation bilaterally, no wheezing, rales or rhonchi. Abdomen: Obese Soft, nontender, nondistended, positive bowel sounds, no masses. Extremities: No clubbing, cyanosis or edema with positive pedal pulses. Neuro: Grossly intact, no focal neurological deficits, strength 5/5 upper and lower extremities bilaterally Psych: alert and oriented x 3, normal mood and affect Skin: no rashes or lesions, warm and dry   LABS on Admission:  Basic Metabolic Panel:  Recent Labs Lab 06/12/14 1710 06/13/14 1105  NA  132* 133*  K 4.5 4.3  CL 95* 97  CO2 18*  18*  GLUCOSE 150* 133*  BUN 46* 43*  CREATININE 3.41* 2.28*  CALCIUM 9.2 8.9   Liver Function Tests:  Recent Labs Lab 06/12/14 1710 06/13/14 1105  AST 31 21  ALT 20 18  ALKPHOS 96 97  BILITOT 0.6 0.8  PROT 7.5 7.0  ALBUMIN 3.5 3.2*    Recent Labs Lab 06/12/14 1710  LIPASE 39   No results found for this basename: AMMONIA,  in the last 168 hours CBC:  Recent Labs Lab 06/12/14 1710 06/13/14 1105  WBC 12.1* 10.1  NEUTROABS 8.1* 7.6  HGB 14.8 14.4  HCT 42.2 41.6  MCV 94.6 95.0  PLT 217 171   Cardiac Enzymes: No results found for this basename: CKTOTAL, CKMB, CKMBINDEX, TROPONINI,  in the last 168 hours BNP: No components found with this basename: POCBNP,  CBG: No results found for this basename: GLUCAP,  in the last 168 hours   Radiological Exams on Admission: Ct Abdomen Pelvis Wo Contrast  06/13/2014   CLINICAL DATA:  Abdominal pain for few weeks. Prostate cancer. Abdominal aortic aneurysm.  EXAM: CT ABDOMEN AND PELVIS WITHOUT CONTRAST  TECHNIQUE: Multidetector CT imaging of the abdomen and pelvis was performed following the standard protocol without IV contrast.  COMPARISON:  None.  FINDINGS: Musculoskeletal: No aggressive osseous lesions. Mild thoracic spondylosis.  Lung Bases: Atelectasis.  Liver: Unenhanced CT was performed per clinician order. Lack of IV contrast limits sensitivity and specificity, especially for evaluation of abdominal/pelvic solid viscera. Normal.  Spleen:  Normal.  Gallbladder:  Dependent biliary sludge.  No calcified stones.  Common bile duct:  Normal.  Pancreas:  Normal.  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  Kidneys: Bilateral renal cortical atrophy, likely representing medical renal disease. Interpolar simple LEFT renal cyst. This measures about 14 mm. No calculi. Renal vascular calcifications. Atherosclerosis. Both ureters  appear within normal limits.  Stomach:  Normal.  Small bowel: Duodenum is within normal limits. There is no small bowel obstruction. Mild nonspecific stranding is present in the small bowel mesentery. Radiopaque density is present in the terminal ileum, likely representing residual barium contrast most of which has passed into the colon.  Colon: Normal appendix. Severe colonic diverticulosis. No diverticulitis.  Pelvic Genitourinary: Urinary bladder appears normal. Prostatectomy. No pelvic adenopathy.  Vasculature: Atherosclerosis. Coronary artery atherosclerosis is present. If office based assessment of coronary risk factors has not been performed, it is now recommended. Fatty infiltration of the interatrial septum compatible with lipomatous hypertrophy of the interatrial septum. Abdominal aortic and visceral atherosclerosis without aneurysm.  Body Wall: Tiny fat containing periumbilical hernia.  IMPRESSION: 1. No acute abnormality. 2. Atherosclerosis and coronary artery disease. 3. Prostatectomy.   Electronically Signed   By: Dereck Ligas M.D.   On: 06/13/2014 11:50    Assessment/Plan Principal Problem:   Acute renal failure likely due to dehydration: - Will admit for observation, placed on IV fluid hydration, will check renal ultrasound, urinalysis and culture, urine sodium for further workup - Obtain pharmacy consult for outpatient medication review    Active Problems: Chronic abdominal pain: Unclear etiology, patient does endorse prior history of diverticulitis. Denied taking any NSAIDs - CT abdomen and pelvis negative for acute abdominal pathology, check lipase, will discuss with gastroenterology, ? Endoscopy - For now place on full liquid diet  Left shoulder pain: - Will check left shoulder x-ray    Diabetes mellitus without complication - Obtain hemoglobin A1c, will place on sliding scale  insulin    Hypertension: Stable    Hyperlipidemia - Check lipid panel    Dehydration -  Continue IV fluid hydration   DVT prophylaxis: Lovenox   CODE STATUS: Full code   Family Communication: Admission, patients condition and plan of care including tests being ordered have been discussed with the patient and  wife who indicates understanding and agree with the plan and Code Status   Further plan will depend as patient's clinical course evolves and further radiologic and laboratory data become available.   Time Spent on Admission: One-hour   Laasya Peyton M.D. [facility] 06/13/2014, 1:01 PM Pager: [phone]  If 7PM-7AM, please contact night-coverage [email or web address] Password [email or web address]  **Disclaimer: This note was dictated with voice recognition software. Similar sounding words can inadvertently be transcribed and this note may contain transcription errors which may not have been corrected upon publication of note.**

## 2014-06-14 LAB — CBC
HCT: 39 % (ref 39.0–52.0)
HEMOGLOBIN: 12.9 g/dL — AB (ref 13.0–17.0)
MCH: 32.7 pg (ref 26.0–34.0)
MCHC: 33.1 g/dL (ref 30.0–36.0)
MCV: 98.7 fL (ref 78.0–100.0)
Platelets: 148 10*3/uL — ABNORMAL LOW (ref 150–400)
RBC: 3.95 MIL/uL — ABNORMAL LOW (ref 4.22–5.81)
RDW: 12.6 % (ref 11.5–15.5)
WBC: 8.6 10*3/uL (ref 4.0–10.5)

## 2014-06-14 LAB — BASIC METABOLIC PANEL
Anion gap: 15 (ref 5–15)
BUN: 31 mg/dL — AB (ref 6–23)
CO2: 18 mEq/L — ABNORMAL LOW (ref 19–32)
Calcium: 8.5 mg/dL (ref 8.4–10.5)
Chloride: 103 mEq/L (ref 96–112)
Creatinine, Ser: 1.48 mg/dL — ABNORMAL HIGH (ref 0.50–1.35)
GFR calc Af Amer: 52 mL/min — ABNORMAL LOW (ref >90)
GFR calc non Af Amer: 45 mL/min — ABNORMAL LOW (ref >90)
GLUCOSE: 120 mg/dL — AB (ref 70–99)
Potassium: 5 mEq/L (ref 3.7–5.3)
Sodium: 136 mEq/L — ABNORMAL LOW (ref 137–147)

## 2014-06-14 LAB — URINE CULTURE
CULTURE: NO GROWTH
Colony Count: NO GROWTH

## 2014-06-14 LAB — GLUCOSE, CAPILLARY
GLUCOSE-CAPILLARY: 216 mg/dL — AB (ref 70–99)
Glucose-Capillary: 133 mg/dL — ABNORMAL HIGH (ref 70–99)

## 2014-06-14 LAB — HEMOGLOBIN A1C
Hgb A1c MFr Bld: 6.3 % — ABNORMAL HIGH (ref <5.7)
Mean Plasma Glucose: 134 mg/dL — ABNORMAL HIGH (ref <117)

## 2014-06-14 MED ORDER — POLYETHYLENE GLYCOL 3350 17 G PO PACK: 17.0000 g | PACK | Freq: Every day | ORAL | Status: DC | PRN

## 2014-06-14 MED ORDER — PANTOPRAZOLE SODIUM 40 MG PO TBEC: 40.0000 mg | DELAYED_RELEASE_TABLET | Freq: Every day | ORAL | Status: DC

## 2014-06-14 MED ORDER — HYDROCODONE-ACETAMINOPHEN 5-325 MG PO TABS: 1.0000 | ORAL_TABLET | Freq: Four times a day (QID) | ORAL | Status: DC | PRN

## 2014-06-14 MED ORDER — ENOXAPARIN SODIUM 60 MG/0.6ML ~~LOC~~ SOLN
60.0000 mg | SUBCUTANEOUS | Status: DC
  Filled 2014-06-14: qty 0.6

## 2014-06-14 MED ORDER — PIOGLITAZONE HCL 30 MG PO TABS: 30.0000 mg | ORAL_TABLET | Freq: Every day | ORAL | Status: DC

## 2014-06-14 NOTE — Progress Notes (Signed)
Pharmacy: Re-lovenox  Patient's a 73 y.o M on lovenox 30mg  q24h for VTE prophylaxis.  Scr continues to trend down with 1.81 today (est crcl~47).   Hgb stable, no bleeding documented. Weight 117.5kg, height 6 ft, BMI>30  Plan: 1) increase lovenox dose to 60mg  SQ daily (~0.5mg /kg/day-- adjusted dose for high BMI) d/t improving renal function and high BMI 2) pharmacy will sign off for now.  Will re-adjust if crcl<30, has bleeding side effects, or platelets decreased more than 50% from baseline  Dia Sitter, PharmD, BCPS

## 2014-06-14 NOTE — Discharge Summary (Signed)
PATIENT DETAILS Name: Ian Estes Age: 73 y.o. Sex: male Date of Birth: [birth year] MRN: [id number]. Admit Date: 06/13/2014 Admitting Physician: Mendel Corning, MD KGY:JEHUDJS,HFWYO A, MD  Recommendations for Outpatient Follow-up:  1. Please repeat BMET at next visit 2. Will require further workup as outpatient for persistent abdominal pain-will need follow up with Dr Collene Mares 3. Have stopped HCTZ, lisinopril given acute renal failure 4. Have stopped metformin given mild residual renal failure, and possible need for arteriogram in near future (see below)  PRIMARY DISCHARGE DIAGNOSIS:  Principal Problem:   Acute renal failure Active Problems:   Diabetes mellitus without complication   Hypertension   Chronic abdominal pain   Hyperlipidemia   Dehydration      PAST MEDICAL HISTORY: Past Medical History  Diagnosis Date  . Obesity   . High cholesterol   . Hypertension   . Pneumonia 2014 X 1  . Type II diabetes mellitus   . Arthritis     "all my joints" (06/13/2014)  . Gout   . Prostate cancer   . 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     amLODipine 5 MG tablet  Commonly known as:  NORVASC  Take 5 mg by mouth daily.     HYDROcodone-acetaminophen 5-325 MG per tablet  Commonly known as:  NORCO/VICODIN  Take 1 tablet by mouth every 6 (six) hours as needed for moderate pain.     pantoprazole 40 MG tablet  Commonly known as:  PROTONIX  Take 1 tablet (40 mg total) by mouth daily at 6 (six) AM.     pioglitazone 30 MG tablet  Commonly known as:   ACTOS  Take 1 tablet (30 mg total) by mouth daily.     polyethylene glycol packet  Commonly known as:  MIRALAX / GLYCOLAX  Take 17 g by mouth daily as needed for moderate constipation.     simvastatin 40 MG tablet  Commonly known as:  ZOCOR  Take 40 mg by mouth daily.        ALLERGIES:  No Known Allergies  BRIEF HPI:  See H&P, Labs, Consult and Test reports for all details in brief, patient was admitted for evaluation of acute renal failure and persistent abdominal pain.  CONSULTATIONS:   GI  PERTINENT RADIOLOGIC STUDIES: Ct Abdomen Pelvis Wo Contrast  06/13/2014   CLINICAL DATA:  Abdominal pain for few weeks. Prostate cancer. Abdominal aortic aneurysm.  EXAM: CT ABDOMEN AND PELVIS WITHOUT CONTRAST  TECHNIQUE: Multidetector CT imaging of the abdomen and pelvis was performed following the standard protocol without IV contrast.  COMPARISON:  None.  FINDINGS: Musculoskeletal: No aggressive osseous lesions. Mild thoracic spondylosis.  Lung Bases: Atelectasis.  Liver: Unenhanced CT was performed per clinician order. Lack of IV contrast limits sensitivity and specificity, especially for evaluation of abdominal/pelvic solid viscera. Normal.  Spleen:  Normal.  Gallbladder:  Dependent biliary sludge.  No calcified stones.  Common bile duct:  Normal.  Pancreas:  Normal.  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  Kidneys: Bilateral renal cortical atrophy, likely representing medical renal disease. Interpolar simple LEFT renal cyst. This measures about 14 mm. No calculi. Renal vascular calcifications. Atherosclerosis. Both ureters appear within normal limits.  Stomach:  Normal.  Small bowel: Duodenum is within normal limits. There is no small bowel obstruction. Mild nonspecific stranding is present in the small bowel mesentery. Radiopaque density is present in the terminal ileum, likely representing residual barium contrast most of  which has passed into the colon.  Colon: Normal appendix. Severe colonic diverticulosis. No diverticulitis.  Pelvic Genitourinary: Urinary bladder appears normal. Prostatectomy. No pelvic adenopathy.  Vasculature: Atherosclerosis. Coronary artery atherosclerosis is present. If office based assessment of coronary risk factors has not been performed, it is now recommended. Fatty infiltration of the interatrial septum compatible with lipomatous hypertrophy of the interatrial septum. Abdominal aortic and visceral atherosclerosis without aneurysm.  Body Wall: Tiny fat containing periumbilical hernia.  IMPRESSION: 1. No acute abnormality. 2. Atherosclerosis and coronary artery disease. 3. Prostatectomy.   Electronically Signed   By: Dereck Ligas M.D.   On: 06/13/2014 11:50   US Renal  06/13/2014   CLINICAL DATA:  Renal insufficiency.  EXAM: RENAL/URINARY TRACT ULTRASOUND COMPLETE  COMPARISON:  CT scan from 06/13/2014.  FINDINGS: Right Kidney:  Length: 10.8 cm.  Cortical thinning.  No hydronephrosis.  Left Kidney:  Length: 13.2 cm. Cortical thinning. No hydronephrosis. 12 mm cyst identified in the interpolar region.  Bladder:  Nondistended bladder.  IMPRESSION: No evidence for hydronephrosis.   Electronically Signed   By: Misty Stanley M.D.   On: 06/13/2014 18:41   Dg Shoulder Left  06/13/2014   CLINICAL DATA:  Shoulder pain with no history of injury.  EXAM: LEFT SHOULDER - 2+ VIEW  COMPARISON:  None.  FINDINGS: Patient could not position for an axillary view. No evidence for fracture or dislocation on this two view study. Acromioclavicular and coracoclavicular distances are preserved. Degenerative changes are noted at the [facility] joint. No worrisome lytic or sclerotic osseous abnormality.  IMPRESSION: AC joint degeneration.   Electronically Signed   By: Misty Stanley M.D.   On: 06/13/2014 13:53     PERTINENT LAB RESULTS: CBC:  Recent Labs  06/13/14 1637 06/14/14 0715  WBC 10.4 8.6  HGB 14.4 12.9*  HCT  42.5 39.0  PLT 183 148*   CMET CMP     Component Value Date/Time   NA 136* 06/14/2014 0715   K 5.0 06/14/2014 0715   CL 103 06/14/2014 0715   CO2 18* 06/14/2014 0715   GLUCOSE 120* 06/14/2014 0715   BUN 31* 06/14/2014 0715   CREATININE 1.48* 06/14/2014 0715   CALCIUM 8.5 06/14/2014 0715   PROT 7.0 06/13/2014 1105   ALBUMIN 3.2* 06/13/2014 1105   AST 21 06/13/2014 1105   ALT 18 06/13/2014 1105   ALKPHOS 97 06/13/2014 1105   BILITOT 0.8 06/13/2014 1105   GFRNONAA 45* 06/14/2014 0715   GFRAA 52* 06/14/2014 0715    GFR Estimated Creatinine Clearance: 58 ml/min (by C-G formula based on Cr of 1.48).  Recent Labs  06/12/14 1710 06/13/14 1301  LIPASE 39 117*   No results found for this basename: CKTOTAL, CKMB, CKMBINDEX, TROPONINI,  in the last 72 hours No components found with this basename: POCBNP,  No results found for this basename: DDIMER,  in the last 72 hours No results found for  this basename: HGBA1C,  in the last 72 hours  Recent Labs  06/13/14 1648  CHOL 165  HDL 52  LDLCALC 83  TRIG 148  CHOLHDL 3.2   No results found for this basename: TSH, T4TOTAL, FREET3, T3FREE, THYROIDAB,  in the last 72 hours No results found for this basename: VITAMINB12, FOLATE, FERRITIN, TIBC, IRON, RETICCTPCT,  in the last 72 hours Coags: No results found for this basename: PT, INR,  in the last 72 hours Microbiology: No results found for this or any previous visit (from the past 240 hour(s)).   BRIEF HOSPITAL COURSE:   Principal Problem:   Acute renal failure - Suspect multifactorial-likely secondary to poor oral intake given ongoing abdominal pain, use of HCTZ and lisinopril. The patient was admitted, given IV fluids and other supportive measures. Creatinine on admission was 3.41 on 8/25, by day of discharge creatinine down to 1.48. Urinalysis was negative for proteinuria, no evidence of hydronephrosis seen on renal ultrasound. On discharge, will stop both HCTZ and lisinopril, just maintain  on amlodipine for hypertension.  Active Problems: Abdominal pain - Etiology unknown at this point. CT scan of the abdomen without contrast negative for acute abnormalities. On admission, given supportive measures, full liquid diet. Diet quickly advanced to regular diet this morning, which the patient has tolerated. Surprisingly, pain-free this morning. Abdominal exam is totally benign-no tenderness at all on palpation. Patient was evaluated by Dr. Collene Mares, etiology unknown, but Dr. Collene Mares will do further workup including the need for arteriogram to rule out mesenteric ischemia as an outpatient. I've spoken to on the phone, she will make appropriate followup arrangements as an outpatient.    Diabetes mellitus without complication - Given creatinine 1.48 the need for possible arteriogram in the near future, will stop metformin. Will switch over to Actos. Further optimization to be done in the outpatient setting    Hypertension - BP controlled, all antihypertensive medications on hold on admission. On discharge we'll continue to hold lisinopril and HCTZ, and just place on amlodipine. Further optimization to be done in the outpatient setting by PCP.    Hyperlipidemia - Continue with statin    Dehydration - Resolved with IV fluids. Clinically euvolemic at the time of discharge.  TODAY-DAY OF DISCHARGE:  Subjective:   Ian Estes today has no headache,no chest abdominal pain,no new weakness tingling or numbness, feels much better wants to go home today.   Objective:   Blood pressure 112/59, pulse 70, temperature 98.4 F (36.9 C), temperature source Oral, resp. rate 20, height 6' (1.829 m), weight 117.5 kg (259 lb 0.7 oz), SpO2 98.00%.  Intake/Output Summary (Last 24 hours) at 06/14/14 1048 Last data filed at 06/14/14 0500  Gross per 24 hour  Intake    240 ml  Output    150 ml  Net     90 ml   Filed Weights   06/13/14 1500  Weight: 117.5 kg (259 lb 0.7 oz)    Exam Awake Alert,  Oriented *3, No new F.N deficits, Normal affect [place].AT,PERRAL Supple Neck,No JVD, No cervical lymphadenopathy appriciated.  Symmetrical Chest wall movement, Good air movement bilaterally, CTAB RRR,No Gallops,Rubs or new Murmurs, No Parasternal Heave +ve B.Sounds, Abd Soft, Non tender, No organomegaly appriciated, No rebound -guarding or rigidity. No Cyanosis, Clubbing or edema, No new Rash or bruise  DISCHARGE CONDITION: Stable  DISPOSITION: Home   DISCHARGE INSTRUCTIONS:    Activity:  As tolerated   Diet recommendation: Diabetic Diet Heart Healthy diet  Discharge Instructions   Call MD for:  persistant nausea and vomiting    Complete by:  As directed      Call MD for:  severe uncontrolled pain    Complete by:  As directed      Diet - low sodium heart healthy    Complete by:  As directed      Diet Carb Modified    Complete by:  As directed      Increase activity slowly    Complete by:  As directed            Follow-up Information   Follow up with BURNETT,BRENT A, MD. Schedule an appointment as soon as possible for a visit in 1 week.   Specialty:  Family Medicine   Contact information:   [address] [place] [address] [phone]       Follow up with MANN,JYOTHI, MD. Schedule an appointment as soon as possible for a visit in 1 week.   Specialty:  Gastroenterology   Contact information:   [address] [place] [address] [phone]       Total Time spent on discharge equals 45 minutes.  SignedOren Binet 06/14/2014 10:48 AM  **Disclaimer: This note may have been dictated with voice recognition software. Similar sounding words can inadvertently be transcribed and this note may contain transcription errors which may not have been corrected upon publication of note.**

## 2014-06-14 NOTE — Progress Notes (Signed)
Patient discharge teaching given, including activity, diet, follow-up appoints, and medications. Patient verbalized understanding of all discharge instructions. IV access was d/c'd. Vitals are stable. Skin is intact except as charted in most recent assessments. Pt to be escorted out by NT, to be driven home by family. 

## 2014-06-14 NOTE — ED Provider Notes (Signed)
Medical screening examination/treatment/procedure(s) were conducted as a shared visit with non-physician practitioner(s) and myself.  I personally evaluated the patient during the encounter.   EKG Interpretation   Date/Time:  Wednesday June 13 2014 10:47:00 EDT Ventricular Rate:  86 PR Interval:  171 QRS Duration: 99 QT Interval:  375 QTC Calculation: 448 R Axis:   -75 Text Interpretation:  Sinus rhythm Atrial premature complex LAD, consider  left anterior fascicular block Low voltage, precordial leads Abnormal  R-wave progression, early transition Borderline T abnormalities, anterior  leads LAFB new since previous  Confirmed by YAO  MD, DAVID ([id number]) on  06/13/2014 10:49:59 AM      Ian Estes is a 73 y.o. male hx of HTN, HL, DM here with ab pain, abnormal labs. Lower abdominal pain for the last month. Sent for outpatient CT by PMD. However yesterday prior to CT he was noted to have Cr 3 so sent to ED. Left without being seen. Today came back. States that he hasn't been drinking much. On exam, minimal diffuse abdominal tenderness, appears dehydrated. Cr down to 2.2 from 3. CT ab/pel unremarkable. Will admit for renal failure likely from dehydration. I supervised the PA's angiocath insertion.    Wandra Arthurs, MD 06/14/14 [phone]

## 2014-06-14 NOTE — Progress Notes (Signed)
Note/chart reviewed. Agree with note.   Devian Bartolomei RD, LDN, CNSC [phone] Pager [phone] After Hours Pager   

## 2014-06-14 NOTE — Care Management Note (Signed)
    Page 1 of 1   06/14/2014     11:47:25 AM CARE MANAGEMENT NOTE 06/14/2014  Patient:  Ian Estes, Ian Estes   Account Number:  [id number]  Date Initiated:  06/14/2014  Documentation initiated by:  Tomi Bamberger  Subjective/Objective Assessment:   dx acute reanl failure, abd pain  admit as observation- lives with spouse.     Action/Plan:   Anticipated DC Date:  06/14/2014   Anticipated DC Plan:  [place]  CM consult      Choice offered to / List presented to:             Status of service:  Completed, signed off Medicare Important Message given?  NA - LOS <3 / Initial given by admissions (If response is "NO", the following Medicare IM given date fields will be blank) Date Medicare IM given:   Medicare IM given by:   Date Additional Medicare IM given:   Additional Medicare IM given by:    Discharge Disposition:  HOME/SELF CARE  Per UR Regulation:  Reviewed for med. necessity/level of care/duration of stay  If discussed at [place] of Stay Meetings, dates discussed:    Comments:  06/14/14 [place], BSN [phone] patient is for dc today, no needs anticipated.

## 2014-06-14 NOTE — Progress Notes (Signed)
INITIAL NUTRITION ASSESSMENT  DOCUMENTATION CODES Per approved criteria  -Obesity Unspecified   INTERVENTION:  Encouraged PO intake of most meals  Will continue to monitor  NUTRITION DIAGNOSIS: Inadequate oral intake related to decreased appetite, abdominal pain as evidenced by 15 lb weight loss x 3 months.    Goal: Pt to meet >/= 90% of their estimated nutrition needs   Monitor:  PO intake, weight, labs, I/O's, HgbA1c  Reason for Assessment: Consult, Poor PO intake   Admitting Dx: Acute renal failure  ASSESSMENT: 73 year old male with history of diabetes, hypertension, hyperlipidemia, presented to ED with abdominal pain for last 2 weeks. The patient reports that abdominal pain is generalized, worse in periumbilical area and left lower quadrant, he has a history of prior diverticulitis. He reports a 15 pound weight loss in the last 6 month as he has not been eating "like he used to be".  Pt reports having no appetite PTA and having decreased PO intake and wt loss of 15 lb x 3 months. States that his appetite is improving this AM and he plans to eat his meals. Pain is being managed.   Pt reports that his UBW is 290 lbs, he lost 30 lbs after he was diagnosed with pneumonia last year. When asked how his diabetes is controlled, he states that he controls it by diet, he stopped taking metformin.   Offered to send a supplement with meals but pt denied, felt that "he can eat enough". Pt expressed that he "needs to lose more weight". Suggested to pt and family that he can drink Glucerna shakes at home if he experiences any further unintentional wt loss or decrease in appetite. Encouraged PO intake of most meals.  Nutrition Focused Physical Exam:  Subcutaneous Fat:  Orbital Region: WNL Upper Arm Region: moderate depletion Thoracic and Lumbar Region: WNL  Muscle:  Temple Region: WNL Clavicle Bone Region: WNL Clavicle and Acromion Bone Region: WNL Scapular Bone Region: WNL Dorsal  Hand: WNL Patellar Region: WNL Anterior Thigh Region: WNL Posterior Calf Region: WNL  Edema: +1 LLE + RLE edema   Labs reviewed:  Glucose 120 High BUN High Creatinine HgbA1c -pending  Height: Ht Readings from Last 1 Encounters:  06/13/14 6' (1.829 m)    Weight:  Wt Readings from Last 1 Encounters:  06/13/14 259 lb 0.7 oz (117.5 kg)    Ideal Body Weight: 178 lb  % Ideal Body Weight: 146%  Wt Readings from Last 10 Encounters:  06/13/14 259 lb 0.7 oz (117.5 kg)    Usual Body Weight: 290 lb  % Usual Body Weight: 89%  BMI:  Body mass index is 35.12 kg/(m^2).  Estimated Nutritional Needs: Kcal: 2200-2300 Protein: 110-120g Fluid: 2.2 L/day  Skin: intact  Diet Order: Cardiac  EDUCATION NEEDS: -No education needs identified at this time   Intake/Output Summary (Last 24 hours) at 06/14/14 1039 Last data filed at 06/14/14 0500  Gross per 24 hour  Intake    240 ml  Output    150 ml  Net     90 ml    Last BM: 8/25   Labs:   Recent Labs Lab 06/12/14 1710 06/13/14 1105 06/13/14 1637 06/14/14 0715  NA 132* 133*  --  136*  K 4.5 4.3  --  5.0  CL 95* 97  --  103  CO2 18* 18*  --  18*  BUN 46* 43*  --  31*  CREATININE 3.41* 2.28* 1.81* 1.48*  CALCIUM 9.2 8.9  --  8.5  GLUCOSE 150* 133*  --  120*    CBG (last 3)   Recent Labs  06/13/14 1733 06/13/14 2154 06/14/14 0738  GLUCAP 142* 174* 133*    Scheduled Meds: . enoxaparin (LOVENOX) injection  60 mg Subcutaneous Q24H  . insulin aspart  0-5 Units Subcutaneous QHS  . insulin aspart  0-9 Units Subcutaneous TID WC  . pantoprazole  40 mg Oral Q0600  . senna-docusate  1 tablet Oral QHS    Continuous Infusions: . sodium chloride 100 mL/hr at 06/14/14 0243    Past Medical History  Diagnosis Date  . Obesity   . High cholesterol   . Hypertension   . Pneumonia 2014 X 1  . Type II diabetes mellitus   . Arthritis     "all my joints" (06/13/2014)  . Gout   . Prostate cancer   . 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 Prostate surgery  1996  . Incision and drainage perirectal abscess  1990's    "hemorrhoid"  . Knee arthroscopy Right ~ 2011  . Skin cancer excision  ~ 2005 X 2    "left foot"    Clayton Bibles, [place], [place] Licensed Dietitian Nutritionist Pager: [phone]

## 2014-07-09 ENCOUNTER — Other Ambulatory Visit: Payer: Self-pay | Admitting: Internal Medicine

## 2014-10-29 ENCOUNTER — Encounter (INDEPENDENT_AMBULATORY_CARE_PROVIDER_SITE_OTHER): Payer: Medicare HMO | Admitting: Ophthalmology

## 2014-10-29 DIAGNOSIS — E11319 Type 2 diabetes mellitus with unspecified diabetic retinopathy without macular edema: Secondary | ICD-10-CM

## 2014-10-29 DIAGNOSIS — E11329 Type 2 diabetes mellitus with mild nonproliferative diabetic retinopathy without macular edema: Secondary | ICD-10-CM

## 2014-10-29 DIAGNOSIS — H35033 Hypertensive retinopathy, bilateral: Secondary | ICD-10-CM

## 2014-10-29 DIAGNOSIS — H2513 Age-related nuclear cataract, bilateral: Secondary | ICD-10-CM

## 2014-10-29 DIAGNOSIS — H43813 Vitreous degeneration, bilateral: Secondary | ICD-10-CM

## 2014-10-29 DIAGNOSIS — I10 Essential (primary) hypertension: Secondary | ICD-10-CM

## 2015-08-21 ENCOUNTER — Other Ambulatory Visit: Payer: Self-pay | Admitting: Specialist

## 2015-08-21 DIAGNOSIS — M545 Low back pain: Secondary | ICD-10-CM

## 2015-08-30 ENCOUNTER — Ambulatory Visit
Admission: RE | Admit: 2015-08-30 | Discharge: 2015-08-30 | Disposition: A | Discharge status: Home / Self Care | Payer: Medicare HMO | Source: Ambulatory Visit | Attending: Specialist | Admitting: Specialist

## 2015-08-30 DIAGNOSIS — M545 Low back pain: Secondary | ICD-10-CM

## 2015-11-01 ENCOUNTER — Ambulatory Visit (INDEPENDENT_AMBULATORY_CARE_PROVIDER_SITE_OTHER): Payer: Medicare HMO | Admitting: Ophthalmology

## 2016-01-01 ENCOUNTER — Ambulatory Visit (INDEPENDENT_AMBULATORY_CARE_PROVIDER_SITE_OTHER): Payer: Medicare HMO | Admitting: Ophthalmology

## 2016-01-03 ENCOUNTER — Ambulatory Visit (INDEPENDENT_AMBULATORY_CARE_PROVIDER_SITE_OTHER): Payer: Medicare HMO | Admitting: Ophthalmology

## 2016-01-03 DIAGNOSIS — H43813 Vitreous degeneration, bilateral: Secondary | ICD-10-CM

## 2016-01-03 DIAGNOSIS — H35372 Puckering of macula, left eye: Secondary | ICD-10-CM | POA: Diagnosis not present

## 2016-01-03 DIAGNOSIS — E113293 Type 2 diabetes mellitus with mild nonproliferative diabetic retinopathy without macular edema, bilateral: Secondary | ICD-10-CM

## 2016-01-03 DIAGNOSIS — I10 Essential (primary) hypertension: Secondary | ICD-10-CM | POA: Diagnosis not present

## 2016-01-03 DIAGNOSIS — E11319 Type 2 diabetes mellitus with unspecified diabetic retinopathy without macular edema: Secondary | ICD-10-CM

## 2016-01-03 DIAGNOSIS — H2513 Age-related nuclear cataract, bilateral: Secondary | ICD-10-CM

## 2016-01-03 DIAGNOSIS — H35033 Hypertensive retinopathy, bilateral: Secondary | ICD-10-CM

## 2017-01-04 ENCOUNTER — Ambulatory Visit (INDEPENDENT_AMBULATORY_CARE_PROVIDER_SITE_OTHER): Payer: Medicare HMO | Admitting: Ophthalmology

## 2017-06-25 ENCOUNTER — Ambulatory Visit (INDEPENDENT_AMBULATORY_CARE_PROVIDER_SITE_OTHER): Payer: Medicare HMO | Admitting: Specialist

## 2017-06-29 ENCOUNTER — Telehealth (INDEPENDENT_AMBULATORY_CARE_PROVIDER_SITE_OTHER): Payer: Self-pay | Admitting: Radiology

## 2017-06-29 NOTE — Telephone Encounter (Signed)
Patient appt was cancelled last Friday by our office. He is requesting appointment as soon as possible with Dr. Louanne Skye for bilateral knee pain. I offered him appt with Jeneen Rinks on Thursday afternoon, but he is concerned about storm coming in and being able to get here. Please return his call in regards to appt with Dr. Louanne Skye.  Thanks.

## 2017-06-29 NOTE — Telephone Encounter (Signed)
Called patient and offered 9am appt time in the morning (someone else cancelled). Patient will be here then.

## 2017-06-30 ENCOUNTER — Ambulatory Visit (INDEPENDENT_AMBULATORY_CARE_PROVIDER_SITE_OTHER): Payer: Medicare HMO | Admitting: Specialist

## 2017-06-30 ENCOUNTER — Encounter (INDEPENDENT_AMBULATORY_CARE_PROVIDER_SITE_OTHER): Payer: Self-pay | Admitting: Specialist

## 2017-06-30 ENCOUNTER — Ambulatory Visit (INDEPENDENT_AMBULATORY_CARE_PROVIDER_SITE_OTHER): Payer: Medicare HMO

## 2017-06-30 VITALS — BP 127/77 | HR 93 | Ht 72.0 in | Wt 250.0 lb

## 2017-06-30 DIAGNOSIS — M170 Bilateral primary osteoarthritis of knee: Secondary | ICD-10-CM | POA: Diagnosis not present

## 2017-06-30 DIAGNOSIS — G8929 Other chronic pain: Secondary | ICD-10-CM

## 2017-06-30 DIAGNOSIS — M25562 Pain in left knee: Secondary | ICD-10-CM | POA: Diagnosis not present

## 2017-06-30 DIAGNOSIS — M25561 Pain in right knee: Secondary | ICD-10-CM

## 2017-06-30 MED ORDER — TRAMADOL HCL 50 MG PO TABS: 50.0000 mg | ORAL_TABLET | Freq: Four times a day (QID) | ORAL | 0 refills | Status: DC | PRN

## 2017-06-30 NOTE — Patient Instructions (Signed)
The main ways of treat osteoarthritis, that are found to be success. Weight loss helps to decrease pain. Exercise is important to maintaining cartilage and thickness and strengthening. NSAIDs like tylenol  are meds decreasing the inflamation. Ice is okay  In afternoon and evening and hot shower in the am Call your primary care physician and enquire if she is okay with your taking tylenolfor arthritis pain and swelling. If okay then Tylenol Arthritis Strength 3-4 times per day.

## 2017-06-30 NOTE — Progress Notes (Signed)
Office Visit Note   Patient: Ian Estes           Date of Birth: 05-26-1941           MRN: [id number] Visit Date: 06/30/2017              Requested by: Stephens Shire, MD [address] [place], [place] [address] PCP: Stephens Shire, MD   Assessment & Plan: Visit Diagnoses:  1. Chronic pain of both knees   2. Primary osteoarthritis of both knees     Plan: The main ways of treat osteoarthritis, that are found to be success. Weight loss helps to decrease pain. Exercise is important to maintaining cartilage and thickness and strengthening. NSAIDs like tylenol  are meds decreasing the inflamation. Ice is okay  In afternoon and evening and hot shower in the am Call your primary care physician and enquire if she is okay with your taking tylenolfor arthritis pain and swelling. If okay then Tylenol Arthritis Strength 3-4 times per day.  Follow-Up Instructions: No Follow-up on file.   Orders:  Orders Placed This Encounter  Procedures  . XR Knee 1-2 Views Left  . XR Knee 1-2 Views Right   Meds ordered this encounter  Medications  . traMADol (ULTRAM) 50 MG tablet    Sig: Take 1 tablet (50 mg total) by mouth every 6 (six) hours as needed for moderate pain.    Dispense:  30 tablet    Refill:  0      Procedures: No procedures performed   Clinical Data: No additional findings.   Subjective: Chief Complaint  Patient presents with  . Right Hip - Pain  . Left Hip - Pain    HPI  Review of Systems  Constitutional: Negative.   HENT: Negative.   Eyes: Negative.   Respiratory: Negative.   Cardiovascular: Negative.   Gastrointestinal: Negative.   Endocrine: Negative.   Genitourinary: Negative.   Musculoskeletal: Negative.   Skin: Negative.   Allergic/Immunologic: Negative.   Neurological: Negative.   Hematological: Negative.   Psychiatric/Behavioral: Negative.      Objective: Vital Signs: BP 127/77 (BP Location: Left Arm, Patient Position:  Sitting)   Pulse 93   Ht 6' (1.829 m)   Wt 250 lb (113.4 kg)   BMI 33.91 kg/m   Physical Exam  Constitutional: He is oriented to person, place, and time. He appears well-developed and well-nourished.  HENT:  Head: Normocephalic and atraumatic.  Eyes: Pupils are equal, round, and reactive to light. EOM are normal.  Neck: Normal range of motion. Neck supple.  Pulmonary/Chest: Effort normal and breath sounds normal.  Abdominal: Soft. Bowel sounds are normal.  Neurological: He is alert and oriented to person, place, and time.  Skin: Skin is warm and dry.  Psychiatric: He has a normal mood and affect. His behavior is normal. Judgment and thought content normal.    Right Knee Exam   Tenderness  The patient is experiencing tenderness in the medial joint line.  Range of Motion  Extension:  -10 abnormal  Flexion: 130   Muscle Strength   The patient has normal right knee strength.  Tests  McMurray:  Medial - negative Lateral - negative Lachman:  Anterior - negative    Posterior - negative Drawer:       Anterior - negative    Posterior - negative Varus: negative Valgus: negative Pivot Shift: negative Patellar Apprehension: negative  Other  Erythema: absent Sensation: normal  Left Knee Exam   Tenderness  The patient is experiencing tenderness in the medial joint line.  Range of Motion  Extension: 0  Flexion: 130   Muscle Strength   The patient has normal left knee strength.  Tests  McMurray:  Medial - negative Lateral - negative Lachman:  Anterior - negative    Posterior - negative Drawer:       Anterior - negative     Posterior - negative Varus: negative Valgus: negative Pivot Shift: negative Patellar Apprehension: negative  Other  Erythema: absent Scars: absent Sensation: normal      Specialty Comments:  No specialty comments available.  Imaging: No results found.   PMFS History: Patient Active Problem List   Diagnosis Date Noted  .  Chronic abdominal pain 06/13/2014  . Hyperlipidemia 06/13/2014  . Acute renal failure ([place]) 06/13/2014  . Dehydration 06/13/2014  . Diabetes mellitus without complication ([place])   . Hypertension   . High cholesterol    Past Medical History:  Diagnosis Date  . Arthritis    "all my joints" (06/13/2014)  . 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 Gout   . High cholesterol   . Hypertension   . Obesity   . Pneumonia 2014 X 1  . Prostate cancer ([place])   . Type II diabetes mellitus ([place])     No family history on file.  Past Surgical History:  Procedure Laterality Date  . INCISION AND DRAINAGE PERIRECTAL ABSCESS  1990's   "hemorrhoid"  . KNEE ARTHROSCOPY Right ~ 2011  . PROSTATE SURGERY  1996  . SKIN CANCER EXCISION  ~ 2005 X 2   "left foot"   Social History   Occupational History  . Not on file.   Social History Main Topics  . Smoking status: Former Smoker    Packs/day: 1.50    Years: 32.00    Types: Cigarettes    Quit date: 08/20/1987  . Smokeless tobacco: Never Used  . Alcohol use 16.8 oz/week    28 Shots of liquor per week     Comment: 06/13/2014 "couple mixed drinks/day; ~ 2 shots in each drink"  . Drug use: No  . Sexual activity: Not Currently

## 2018-06-15 ENCOUNTER — Telehealth (INDEPENDENT_AMBULATORY_CARE_PROVIDER_SITE_OTHER): Payer: Self-pay | Admitting: Specialist

## 2018-06-15 NOTE — Telephone Encounter (Signed)
Patients wife cancelled his appt for 9/5 at 10:45, they rescheduled to next available 10/4. Please add patient to cancellation list, call # [phone]

## 2018-06-16 NOTE — Telephone Encounter (Signed)
I put him on the cancellation list 

## 2018-06-23 ENCOUNTER — Ambulatory Visit (INDEPENDENT_AMBULATORY_CARE_PROVIDER_SITE_OTHER): Payer: Medicare HMO | Admitting: Specialist

## 2018-07-22 ENCOUNTER — Ambulatory Visit (INDEPENDENT_AMBULATORY_CARE_PROVIDER_SITE_OTHER): Payer: Medicare HMO | Admitting: Specialist

## 2018-07-27 ENCOUNTER — Ambulatory Visit (INDEPENDENT_AMBULATORY_CARE_PROVIDER_SITE_OTHER): Payer: Medicare HMO | Admitting: Specialist

## 2018-07-27 ENCOUNTER — Encounter (INDEPENDENT_AMBULATORY_CARE_PROVIDER_SITE_OTHER): Payer: Self-pay | Admitting: Specialist

## 2018-07-27 ENCOUNTER — Ambulatory Visit (INDEPENDENT_AMBULATORY_CARE_PROVIDER_SITE_OTHER): Payer: Medicare HMO

## 2018-07-27 ENCOUNTER — Ambulatory Visit (INDEPENDENT_AMBULATORY_CARE_PROVIDER_SITE_OTHER): Payer: Self-pay

## 2018-07-27 VITALS — BP 119/69 | HR 73 | Ht 72.0 in | Wt 237.0 lb

## 2018-07-27 DIAGNOSIS — M25562 Pain in left knee: Secondary | ICD-10-CM | POA: Diagnosis not present

## 2018-07-27 DIAGNOSIS — M170 Bilateral primary osteoarthritis of knee: Secondary | ICD-10-CM | POA: Diagnosis not present

## 2018-07-27 DIAGNOSIS — G8929 Other chronic pain: Secondary | ICD-10-CM | POA: Diagnosis not present

## 2018-07-27 DIAGNOSIS — M25561 Pain in right knee: Secondary | ICD-10-CM

## 2018-07-27 NOTE — Patient Instructions (Signed)
  Knee is suffering from osteoarthritis, only real proven treatments are Weight loss, and exercise. Low impact aerobics, yoga, pool walking. No NSAIDS ie Motrin or alleve or other arthtiris meds. Well padded shoes help. Ice the knee 2-3 times a day 15-20 mins at a time. CBD oil or capsules through [email or web address]

## 2018-07-27 NOTE — Progress Notes (Signed)
Office Visit Note   Patient: Ian Estes           Date of Birth: May 25, 1941           MRN: [id number] Visit Date: 07/27/2018              Requested by: Stephens Shire, MD [address] [place], [place] [address] PCP: Stephens Shire, MD   Assessment & Plan: Visit Diagnoses:  1. Chronic pain of both knees     Plan: Knee is suffering from osteoarthritis, only real proven treatments are Weight loss, and exercise. Low impact aerobics, yoga, pool walking. No NSAIDS ie Motrin or alleve or other arthtiris meds. Well padded shoes help. Ice the knee 2-3 times a day 15-20 mins at a time. CBD oil or capsules through [email or web address]   Follow-Up Instructions: No follow-ups on file.   Orders:  Orders Placed This Encounter  Procedures  . XR Knee 1-2 Views Right  . XR Knee 1-2 Views Left   No orders of the defined types were placed in this encounter.     Procedures: No procedures performed   Clinical Data: No additional findings.   Subjective: Chief Complaint  Patient presents with  . Right Knee - Pain, Follow-up  . Left Knee - Pain, Follow-up    77 year old male with history of bilateral knee pain. Stiffness in the AM and post sitting. Increasing bowing of the legs.   Review of Systems  Constitutional: Negative.  Negative for activity change, appetite change, chills, diaphoresis, fatigue, fever and unexpected weight change.  HENT: Negative.   Eyes: Negative.   Respiratory: Negative.   Cardiovascular: Negative.   Gastrointestinal: Negative.   Endocrine: Negative.   Genitourinary: Negative.   Musculoskeletal: Negative.   Skin: Negative.   Allergic/Immunologic: Negative.   Neurological: Negative.   Hematological: Negative.   Psychiatric/Behavioral: Negative.      Objective: Vital Signs: BP 119/69 (BP Location: Left Arm, Patient Position: Sitting)   Pulse 73   Ht 6' (1.829 m)   Wt 237 lb (107.5 kg)   BMI 32.14 kg/m   Physical Exam    Constitutional: He is oriented to person, place, and time. He appears well-developed and well-nourished.  HENT:  Head: Normocephalic and atraumatic.  Eyes: Pupils are equal, round, and reactive to light. EOM are normal.  Neck: Normal range of motion. Neck supple.  Pulmonary/Chest: Effort normal and breath sounds normal.  Abdominal: Soft. Bowel sounds are normal.  Neurological: He is alert and oriented to person, place, and time.  Skin: Skin is warm and dry.  Psychiatric: He has a normal mood and affect. His behavior is normal. Judgment and thought content normal.    Right Knee Exam   Muscle Strength  The patient has normal right knee strength.  Tenderness  The patient is experiencing tenderness in the medial joint line and medial retinaculum.  Range of Motion  Extension:  -10 abnormal  Flexion: 130   Tests  McMurray:  Medial - negative Lateral - negative Varus: negative Valgus: negative Lachman:  Anterior - negative    Posterior - negative Drawer:  Anterior - negative    Posterior - negative Pivot shift: negative Patellar apprehension: negative   Left Knee Exam   Muscle Strength  The patient has normal left knee strength.  Tenderness  The patient is experiencing tenderness in the medial joint line and medial retinaculum.  Range of Motion  Extension: normal  Flexion: normal  Tests  McMurray:  Medial - negative Lateral - negative Varus: negative Valgus: negative Lachman:  Anterior - negative    Posterior - negative Drawer:  Anterior - negative     Posterior - negative Pivot shift: negative Patellar apprehension: negative      Specialty Comments:  No specialty comments available.  Imaging: No results found.   PMFS History: Patient Active Problem List   Diagnosis Date Noted  . Chronic abdominal pain 06/13/2014  . Hyperlipidemia 06/13/2014  . Acute renal failure ([facility]) 06/13/2014  . Dehydration 06/13/2014  . Diabetes mellitus without complication  ([place])   . Hypertension   . High cholesterol    Past Medical History:  Diagnosis Date  . Arthritis    "all my joints" (06/13/2014)  . 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 Gout   . High cholesterol   . Hypertension   . Obesity   . Pneumonia 2014 X 1  . Prostate cancer ([place])   . Type II diabetes mellitus ([place])     No family history on file.  Past Surgical History:  Procedure Laterality Date  . INCISION AND DRAINAGE PERIRECTAL ABSCESS  1990's   "hemorrhoid"  . KNEE ARTHROSCOPY Right ~ 2011  . PROSTATE SURGERY  1996  . SKIN CANCER EXCISION  ~ 2005 X 2   "left foot"   Social History   Occupational History  . Not on file  Tobacco Use  . Smoking status: Former Smoker    Packs/day: 1.50    Years: 32.00    Pack years: 48.00    Types: Cigarettes    Last attempt to quit: 08/20/1987    Years since quitting: 30.9  . Smokeless tobacco: Never Used  Substance and Sexual Activity  . Alcohol use: Yes    Alcohol/week: 28.0 standard drinks    Types: 28 Shots of liquor per week    Comment: 06/13/2014 "couple mixed drinks/day; ~ 2 shots in each drink"  . Drug use: No  . Sexual activity: Not Currently

## 2018-12-20 ENCOUNTER — Emergency Department (HOSPITAL_COMMUNITY): Payer: Medicare HMO

## 2018-12-20 ENCOUNTER — Encounter (HOSPITAL_COMMUNITY): Payer: Self-pay | Admitting: *Deleted

## 2018-12-20 ENCOUNTER — Other Ambulatory Visit: Payer: Self-pay

## 2018-12-20 ENCOUNTER — Observation Stay (HOSPITAL_BASED_OUTPATIENT_CLINIC_OR_DEPARTMENT_OTHER): Payer: Medicare HMO

## 2018-12-20 ENCOUNTER — Inpatient Hospital Stay (HOSPITAL_COMMUNITY)
Admission: EM | Admit: 2018-12-20 | Discharge: 2018-12-24 | DRG: 271 | Disposition: A | Discharge status: Home / Self Care | Payer: Medicare HMO | Attending: Cardiothoracic Surgery | Admitting: Cardiothoracic Surgery

## 2018-12-20 ENCOUNTER — Observation Stay (HOSPITAL_COMMUNITY): Payer: Medicare HMO

## 2018-12-20 DIAGNOSIS — I482 Chronic atrial fibrillation, unspecified: Secondary | ICD-10-CM | POA: Diagnosis not present

## 2018-12-20 DIAGNOSIS — Z79899 Other long term (current) drug therapy: Secondary | ICD-10-CM

## 2018-12-20 DIAGNOSIS — I361 Nonrheumatic tricuspid (valve) insufficiency: Secondary | ICD-10-CM | POA: Diagnosis not present

## 2018-12-20 DIAGNOSIS — I208 Other forms of angina pectoris: Secondary | ICD-10-CM

## 2018-12-20 DIAGNOSIS — R079 Chest pain, unspecified: Secondary | ICD-10-CM | POA: Diagnosis present

## 2018-12-20 DIAGNOSIS — E7800 Pure hypercholesterolemia, unspecified: Secondary | ICD-10-CM | POA: Diagnosis present

## 2018-12-20 DIAGNOSIS — I2510 Atherosclerotic heart disease of native coronary artery without angina pectoris: Secondary | ICD-10-CM | POA: Diagnosis present

## 2018-12-20 DIAGNOSIS — Z87891 Personal history of nicotine dependence: Secondary | ICD-10-CM

## 2018-12-20 DIAGNOSIS — J90 Pleural effusion, not elsewhere classified: Secondary | ICD-10-CM | POA: Diagnosis present

## 2018-12-20 DIAGNOSIS — Z8546 Personal history of malignant neoplasm of prostate: Secondary | ICD-10-CM | POA: Diagnosis not present

## 2018-12-20 DIAGNOSIS — E119 Type 2 diabetes mellitus without complications: Secondary | ICD-10-CM | POA: Diagnosis not present

## 2018-12-20 DIAGNOSIS — Z6831 Body mass index (BMI) 31.0-31.9, adult: Secondary | ICD-10-CM | POA: Diagnosis not present

## 2018-12-20 DIAGNOSIS — I10 Essential (primary) hypertension: Secondary | ICD-10-CM

## 2018-12-20 DIAGNOSIS — N179 Acute kidney failure, unspecified: Secondary | ICD-10-CM | POA: Diagnosis present

## 2018-12-20 DIAGNOSIS — Z66 Do not resuscitate: Secondary | ICD-10-CM | POA: Diagnosis present

## 2018-12-20 DIAGNOSIS — I3139 Other pericardial effusion (noninflammatory): Secondary | ICD-10-CM | POA: Diagnosis present

## 2018-12-20 DIAGNOSIS — Z9889 Other specified postprocedural states: Secondary | ICD-10-CM

## 2018-12-20 DIAGNOSIS — J9811 Atelectasis: Secondary | ICD-10-CM | POA: Diagnosis not present

## 2018-12-20 DIAGNOSIS — Z85820 Personal history of malignant melanoma of skin: Secondary | ICD-10-CM | POA: Diagnosis not present

## 2018-12-20 DIAGNOSIS — I4819 Other persistent atrial fibrillation: Secondary | ICD-10-CM | POA: Diagnosis present

## 2018-12-20 DIAGNOSIS — I309 Acute pericarditis, unspecified: Principal | ICD-10-CM | POA: Diagnosis present

## 2018-12-20 DIAGNOSIS — I313 Pericardial effusion (noninflammatory): Secondary | ICD-10-CM | POA: Diagnosis not present

## 2018-12-20 DIAGNOSIS — I700 Atherosclerosis of aorta: Secondary | ICD-10-CM | POA: Diagnosis present

## 2018-12-20 DIAGNOSIS — M109 Gout, unspecified: Secondary | ICD-10-CM | POA: Diagnosis not present

## 2018-12-20 DIAGNOSIS — I4891 Unspecified atrial fibrillation: Secondary | ICD-10-CM | POA: Diagnosis not present

## 2018-12-20 DIAGNOSIS — E7849 Other hyperlipidemia: Secondary | ICD-10-CM | POA: Diagnosis not present

## 2018-12-20 DIAGNOSIS — E785 Hyperlipidemia, unspecified: Secondary | ICD-10-CM | POA: Diagnosis present

## 2018-12-20 DIAGNOSIS — E669 Obesity, unspecified: Secondary | ICD-10-CM | POA: Diagnosis not present

## 2018-12-20 LAB — CBC
HEMATOCRIT: 47.9 % (ref 39.0–52.0)
Hemoglobin: 15.9 g/dL (ref 13.0–17.0)
MCH: 32 pg (ref 26.0–34.0)
MCHC: 33.2 g/dL (ref 30.0–36.0)
MCV: 96.4 fL (ref 80.0–100.0)
Platelets: 231 10*3/uL (ref 150–400)
RBC: 4.97 MIL/uL (ref 4.22–5.81)
RDW: 13 % (ref 11.5–15.5)
WBC: 12.2 10*3/uL — AB (ref 4.0–10.5)
nRBC: 0 % (ref 0.0–0.2)

## 2018-12-20 LAB — BASIC METABOLIC PANEL
Anion gap: 12 (ref 5–15)
BUN: 16 mg/dL (ref 8–23)
CHLORIDE: 105 mmol/L (ref 98–111)
CO2: 17 mmol/L — ABNORMAL LOW (ref 22–32)
CREATININE: 1.16 mg/dL (ref 0.61–1.24)
Calcium: 8.9 mg/dL (ref 8.9–10.3)
GFR calc Af Amer: >60 mL/min (ref >60)
GFR calc non Af Amer: 60 mL/min — ABNORMAL LOW (ref >60)
GLUCOSE: 164 mg/dL — AB (ref 70–99)
Potassium: 4.1 mmol/L (ref 3.5–5.1)
Sodium: 134 mmol/L — ABNORMAL LOW (ref 135–145)

## 2018-12-20 LAB — TROPONIN I
Troponin I: <0.03 ng/mL (ref <0.03)
Troponin I: <0.03 ng/mL (ref <0.03)

## 2018-12-20 LAB — HEPATIC FUNCTION PANEL
ALK PHOS: 144 U/L — AB (ref 38–126)
ALT: 25 U/L (ref 0–44)
AST: 34 U/L (ref 15–41)
Albumin: 3.5 g/dL (ref 3.5–5.0)
BILIRUBIN DIRECT: 0.3 mg/dL — AB (ref 0.0–0.2)
BILIRUBIN TOTAL: 1 mg/dL (ref 0.3–1.2)
Indirect Bilirubin: 0.7 mg/dL (ref 0.3–0.9)
Total Protein: 7.1 g/dL (ref 6.5–8.1)

## 2018-12-20 LAB — I-STAT TROPONIN, ED: Troponin i, poc: 0 ng/mL (ref 0.00–0.08)

## 2018-12-20 LAB — HEMOGLOBIN A1C
Hgb A1c MFr Bld: 5.6 % (ref 4.8–5.6)
Mean Plasma Glucose: 114.02 mg/dL

## 2018-12-20 LAB — ECHOCARDIOGRAM COMPLETE

## 2018-12-20 LAB — CBG MONITORING, ED: Glucose-Capillary: 123 mg/dL — ABNORMAL HIGH (ref 70–99)

## 2018-12-20 LAB — TYPE AND SCREEN
ABO/RH(D): A POS
Antibody Screen: NEGATIVE

## 2018-12-20 LAB — GLUCOSE, CAPILLARY
Glucose-Capillary: 148 mg/dL — ABNORMAL HIGH (ref 70–99)
Glucose-Capillary: 166 mg/dL — ABNORMAL HIGH (ref 70–99)

## 2018-12-20 LAB — APTT: aPTT: 31 seconds (ref 24–36)

## 2018-12-20 LAB — ABO/RH: ABO/RH(D): A POS

## 2018-12-20 LAB — PROTIME-INR
INR: 1.1 (ref 0.8–1.2)
Prothrombin Time: 13.7 seconds (ref 11.4–15.2)

## 2018-12-20 LAB — BRAIN NATRIURETIC PEPTIDE: B NATRIURETIC PEPTIDE 5: 284.6 pg/mL — AB (ref 0.0–100.0)

## 2018-12-20 LAB — LIPASE, BLOOD: Lipase: 21 U/L (ref 11–51)

## 2018-12-20 LAB — C-REACTIVE PROTEIN: CRP: 11 mg/dL — ABNORMAL HIGH (ref <1.0)

## 2018-12-20 LAB — SEDIMENTATION RATE: SED RATE: 22 mm/h — AB (ref 0–16)

## 2018-12-20 LAB — TSH: TSH: 1.785 u[IU]/mL (ref 0.350–4.500)

## 2018-12-20 MED ORDER — ENOXAPARIN SODIUM 40 MG/0.4ML ~~LOC~~ SOLN: 40.0000 mg | SUBCUTANEOUS | Status: DC

## 2018-12-20 MED ORDER — CLONAZEPAM 0.5 MG PO TABS
0.5000 mg | ORAL_TABLET | Freq: Every evening | ORAL | Status: DC | PRN
  Administered 2018-12-21 – 2018-12-22 (×2): 0.5 mg via ORAL
  Filled 2018-12-20 (×2): qty 1

## 2018-12-20 MED ORDER — METOPROLOL TARTRATE 12.5 MG HALF TABLET
12.5000 mg | ORAL_TABLET | Freq: Two times a day (BID) | ORAL | Status: DC
  Administered 2018-12-20 – 2018-12-24 (×8): 12.5 mg via ORAL
  Filled 2018-12-20 (×9): qty 1

## 2018-12-20 MED ORDER — SODIUM CHLORIDE 0.9 % IV SOLN: 250.0000 mL | INTRAVENOUS | Status: DC | PRN

## 2018-12-20 MED ORDER — IOHEXOL 350 MG/ML SOLN
75.0000 mL | Freq: Once | INTRAVENOUS | Status: AC | PRN
  Administered 2018-12-20: 75 mL via INTRAVENOUS

## 2018-12-20 MED ORDER — SODIUM CHLORIDE 0.9% FLUSH: 3.0000 mL | Freq: Once | INTRAVENOUS | Status: DC

## 2018-12-20 MED ORDER — ASPIRIN EC 81 MG PO TBEC: 81.0000 mg | DELAYED_RELEASE_TABLET | Freq: Every day | ORAL | Status: DC

## 2018-12-20 MED ORDER — INSULIN ASPART 100 UNIT/ML ~~LOC~~ SOLN: 0.0000 [IU] | Freq: Every day | SUBCUTANEOUS | Status: DC

## 2018-12-20 MED ORDER — IOPAMIDOL (ISOVUE-370) INJECTION 76%
INTRAVENOUS | Status: AC
  Administered 2018-12-20: 14:00:00
  Filled 2018-12-20: qty 100

## 2018-12-20 MED ORDER — ONDANSETRON HCL 4 MG/2ML IJ SOLN
4.0000 mg | Freq: Once | INTRAMUSCULAR | Status: AC
Start: 2018-12-20 — End: 2018-12-20
  Administered 2018-12-20: 4 mg via INTRAVENOUS
  Filled 2018-12-20: qty 2

## 2018-12-20 MED ORDER — SODIUM CHLORIDE 0.9 % IV SOLN
1.5000 g | INTRAVENOUS | Status: AC
  Administered 2018-12-21: 1.5 g via INTRAVENOUS
  Filled 2018-12-20 (×2): qty 1.5

## 2018-12-20 MED ORDER — FOLIC ACID 1 MG PO TABS
1.0000 mg | ORAL_TABLET | Freq: Every day | ORAL | Status: DC
  Administered 2018-12-21 – 2018-12-24 (×4): 1 mg via ORAL
  Filled 2018-12-20 (×5): qty 1

## 2018-12-20 MED ORDER — AMLODIPINE BESYLATE 5 MG PO TABS
5.0000 mg | ORAL_TABLET | Freq: Every day | ORAL | Status: DC
  Administered 2018-12-20 – 2018-12-21 (×2): 5 mg via ORAL
  Filled 2018-12-20 (×3): qty 1

## 2018-12-20 MED ORDER — ACETAMINOPHEN 325 MG PO TABS: 650.0000 mg | ORAL_TABLET | ORAL | Status: DC | PRN

## 2018-12-20 MED ORDER — ATORVASTATIN CALCIUM 10 MG PO TABS
20.0000 mg | ORAL_TABLET | Freq: Every day | ORAL | Status: DC
  Administered 2018-12-20 – 2018-12-24 (×5): 20 mg via ORAL
  Filled 2018-12-20 (×5): qty 2

## 2018-12-20 MED ORDER — ONDANSETRON HCL 4 MG/2ML IJ SOLN: 4.0000 mg | Freq: Four times a day (QID) | INTRAMUSCULAR | Status: DC | PRN

## 2018-12-20 MED ORDER — INSULIN ASPART 100 UNIT/ML ~~LOC~~ SOLN
0.0000 [IU] | Freq: Three times a day (TID) | SUBCUTANEOUS | Status: DC
  Administered 2018-12-20 (×2): 2 [IU] via SUBCUTANEOUS
  Administered 2018-12-22 – 2018-12-23 (×4): 3 [IU] via SUBCUTANEOUS
  Administered 2018-12-23: 2 [IU] via SUBCUTANEOUS

## 2018-12-20 MED ORDER — VITAMIN B-1 100 MG PO TABS
100.0000 mg | ORAL_TABLET | Freq: Every day | ORAL | Status: DC
  Administered 2018-12-21 – 2018-12-24 (×4): 100 mg via ORAL
  Filled 2018-12-20 (×5): qty 1

## 2018-12-20 MED ORDER — MORPHINE SULFATE (PF) 2 MG/ML IV SOLN
2.0000 mg | Freq: Once | INTRAVENOUS | Status: AC
  Administered 2018-12-20: 2 mg via INTRAVENOUS
  Filled 2018-12-20: qty 1

## 2018-12-20 MED ORDER — MORPHINE SULFATE (PF) 2 MG/ML IV SOLN
2.0000 mg | INTRAVENOUS | Status: DC | PRN
  Administered 2018-12-20: 2 mg via INTRAVENOUS
  Filled 2018-12-20: qty 1

## 2018-12-20 MED ORDER — SODIUM CHLORIDE 0.9% FLUSH: 3.0000 mL | INTRAVENOUS | Status: DC | PRN

## 2018-12-20 MED ORDER — NITROGLYCERIN 0.4 MG SL SUBL: 0.4000 mg | SUBLINGUAL_TABLET | SUBLINGUAL | Status: DC | PRN

## 2018-12-20 MED ORDER — SODIUM CHLORIDE 0.9% FLUSH
3.0000 mL | Freq: Two times a day (BID) | INTRAVENOUS | Status: DC
  Administered 2018-12-20 – 2018-12-21 (×2): 3 mL via INTRAVENOUS

## 2018-12-20 MED ORDER — ALLOPURINOL 100 MG PO TABS
100.0000 mg | ORAL_TABLET | Freq: Every day | ORAL | Status: DC
  Administered 2018-12-20 – 2018-12-24 (×5): 100 mg via ORAL
  Filled 2018-12-20 (×6): qty 1

## 2018-12-20 NOTE — ED Notes (Signed)
Pt to CT via stretcher

## 2018-12-20 NOTE — ED Notes (Signed)
Pt transported to ECHO. 

## 2018-12-20 NOTE — ED Notes (Signed)
ED TO INPATIENT HANDOFF REPORT  ED Nurse Name and Phone #:  Leodis Rains [phone]  S Name/Age/Gender Ian Estes 78 y.o. male Room/Bed: 015C/015C  Code Status   Code Status: DNR  Home/SNF/Other Home Patient oriented to: self, place, time and situation Is this baseline? Yes   Triage Complete: Triage complete  Chief Complaint chest pain   Triage Note To ED for eval of mid-sternal cp starting yesterday afternoon while sitting in recliner. States feels like his chest is going to explode. [facility] to the home this am for eval and told pt they would take him to [facility]- pt requested [facility] and they refused so pt had sister drive him to [place]. Nausea, no vomiting. Pt feels a little clammy. Pain does not radiate. No asprin taken pta.    Allergies No Known Allergies  Level of Care/Admitting Diagnosis ED Disposition    ED Disposition Condition Comment   Admit  Hospital Area: [facility] [[id number]]  Level of Care: Progressive [102]  I expect the patient will be discharged within 24 hours: Yes  LOW acuity---Tx typically complete <24 hrs---ACUTE conditions typically can be evaluated <24 hours---LABS likely to return to acceptable levels <24 hours---IS near functional baseline---EXPECTED to return to current living arrangement---NOT newly hypoxic: Meets criteria for 5C-Observation unit  Diagnosis: Chest pain [599357]  Admitting Physician: Karmen Bongo [[id number]]  Attending Physician: Karmen Bongo [[id number]]  PT Class (Do Not Modify): Observation [104]  PT Acc Code (Do Not Modify): Observation [10022]       B Medical/Surgery History Past Medical History:  Diagnosis Date  . Arthritis    "all my joints" (06/13/2014)  . 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 Gout   . High cholesterol   . Hypertension   . Obesity   . Pneumonia 2014 X 1  . Prostate cancer ([place])   . Type II diabetes mellitus ([place])    Past Surgical History:   Procedure Laterality Date  . INCISION AND DRAINAGE PERIRECTAL ABSCESS  1990's   "hemorrhoid"  . KNEE ARTHROSCOPY Right ~ 2011  . PROSTATE SURGERY  1996  . SKIN CANCER EXCISION  ~ 2005 X 2   "left foot"     A IV Location/Drains/Wounds Patient Lines/Drains/Airways Status   Active Line/Drains/Airways    Name:   Placement date:   Placement time:   Site:   Days:   Peripheral IV 12/20/18 Left Antecubital   12/20/18    0749    Antecubital   less than 1          Intake/Output Last 24 hours No intake or output data in the 24 hours ending 12/20/18 1228  Labs/Imaging Results for orders placed or performed during the hospital encounter of 12/20/18 (from the past 48 hour(s))  Basic metabolic panel     Status: Abnormal   Collection Time: 12/20/18  4:44 AM  Result Value Ref Range   Sodium 134 (L) 135 - 145 mmol/L   Potassium 4.1 3.5 - 5.1 mmol/L   Chloride 105 98 - 111 mmol/L   CO2 17 (L) 22 - 32 mmol/L   Glucose, Bld 164 (H) 70 - 99 mg/dL   BUN 16 8 - 23 mg/dL   Creatinine, Ser 1.16 0.61 - 1.24 mg/dL   Calcium 8.9 8.9 - 10.3 mg/dL   GFR calc non Af Amer 60 (L) >60 mL/min   GFR calc Af Amer >60 >60 mL/min   Anion gap  12 5 - 15    Comment: Performed at [facility], [place] [address], [place], [place] [address]  CBC     Status: Abnormal   Collection Time: 12/20/18  4:44 AM  Result Value Ref Range   WBC 12.2 (H) 4.0 - 10.5 K/uL   RBC 4.97 4.22 - 5.81 MIL/uL   Hemoglobin 15.9 13.0 - 17.0 g/dL   HCT 47.9 39.0 - 52.0 %   MCV 96.4 80.0 - 100.0 fL   MCH 32.0 26.0 - 34.0 pg   MCHC 33.2 30.0 - 36.0 g/dL   RDW 13.0 11.5 - 15.5 %   Platelets 231 150 - 400 K/uL   nRBC 0.0 0.0 - 0.2 %    Comment: Performed at [facility], [place] [address] [place], [place] [address]  Lipase, blood     Status: None   Collection Time: 12/20/18  4:44 AM  Result Value Ref Range   Lipase 21 11 - 51 U/L    Comment: Performed at [facility] [address] [place], [place] [address]  Hepatic  function panel     Status: Abnormal   Collection Time: 12/20/18  4:44 AM  Result Value Ref Range   Total Protein 7.1 6.5 - 8.1 g/dL   Albumin 3.5 3.5 - 5.0 g/dL   AST 34 15 - 41 U/L   ALT 25 0 - 44 U/L   Alkaline Phosphatase 144 (H) 38 - 126 U/L   Total Bilirubin 1.0 0.3 - 1.2 mg/dL   Bilirubin, Direct 0.3 (H) 0.0 - 0.2 mg/dL   Indirect Bilirubin 0.7 0.3 - 0.9 mg/dL    Comment: Performed at [facility] [address] [place], [place] [address]  I-stat troponin, ED     Status: None   Collection Time: 12/20/18  4:50 AM  Result Value Ref Range   Troponin i, poc 0.00 0.00 - 0.08 ng/mL   Comment 3            Comment: Due to the release kinetics of cTnI, a negative result within the first hours of the onset of symptoms does not rule out myocardial infarction with certainty. If myocardial infarction is still suspected, repeat the test at appropriate intervals.   Brain natriuretic peptide     Status: Abnormal   Collection Time: 12/20/18  5:15 AM  Result Value Ref Range   B Natriuretic Peptide 284.6 (H) 0.0 - 100.0 pg/mL    Comment: Performed at [facility] [address] [place], [place] [address]  Hemoglobin A1c     Status: None   Collection Time: 12/20/18  9:01 AM  Result Value Ref Range   Hgb A1c MFr Bld 5.6 4.8 - 5.6 %    Comment: (NOTE) Pre diabetes:          5.7%-6.4% Diabetes:              >6.4% Glycemic control for   <7.0% adults with diabetes    Mean Plasma Glucose 114.02 mg/dL    Comment: Performed at [facility] [address] [place], [place] [address]  TSH     Status: None   Collection Time: 12/20/18  9:01 AM  Result Value Ref Range   TSH 1.785 0.350 - 4.500 uIU/mL    Comment: Performed by a 3rd Generation assay with a functional sensitivity of <=0.01 uIU/mL. Performed at [facility], [place] [address], [place],  [address]   Troponin  I - Now Then Q6H     Status: None   Collection Time: 12/20/18  9:01 AM  Result Value Ref  Range   Troponin I <0.03 <0.03 ng/mL    Comment: Performed at [facility] [address] [place], [place] [address]  CBG monitoring, ED     Status: Abnormal   Collection Time: 12/20/18 11:31 AM  Result Value Ref Range   Glucose-Capillary 123 (H) 70 - 99 mg/dL   Comment 1 Notify RN    Comment 2 Document in Chart    Dg Chest 2 View  Result Date: 12/20/2018 CLINICAL DATA:  Initial evaluation for acute chest pain. EXAM: CHEST - 2 VIEW COMPARISON:  None available. FINDINGS: Moderate cardiomegaly.  Mediastinal silhouette normal. Elevation of the left hemidiaphragm. Associated streaky left basilar opacity favored to reflect atelectasis, although infiltrate could be considered in the correct clinical setting. Trace left pleural effusion. No overt pulmonary edema. No pneumothorax. No acute osseous finding. IMPRESSION: 1. Elevation of left hemidiaphragm with associated streaky left basilar opacity. Atelectasis is favored, although focal infiltrate could be considered in the correct clinical setting. 2. Trace layering left pleural effusion. 3. Cardiomegaly without pulmonary edema. Electronically Signed   By: Jeannine Boga M.D.   On: 12/20/2018 05:21   Ct Angio Chest Pe W/cm &/or Wo Cm  Result Date: 12/20/2018 CLINICAL DATA:  Shortness of breath with high pretest probability of pulmonary embolism. EXAM: CT ANGIOGRAPHY CHEST WITH CONTRAST TECHNIQUE: Multidetector CT imaging of the chest was performed using the standard protocol during bolus administration of intravenous contrast. Multiplanar CT image reconstructions and MIPs were obtained to evaluate the vascular anatomy. CONTRAST:  53mL OMNIPAQUE IOHEXOL 350 MG/ML SOLN COMPARISON:  None. FINDINGS: Cardiovascular: No cardiomegaly. Moderate pericardial effusion that is low-density. Interpretation of serosal enhancement is not possible due to contrast timing. Negative for pulmonary embolism. Limited opacification of the left heart and systemic  arterial tree. Aortic and coronary atherosclerotic calcification. Mediastinum/Nodes: Prominent hilar nodal size, likely reactive. Lungs/Pleura: Bronchomalacia with intermittent airway narrowing. Mild atelectasis greatest in the left lower lobe. There is no edema, consolidation, effusion, or pneumothorax. Upper Abdomen: Negative Musculoskeletal: No acute or aggressive finding.  Spondylosis. The patient experienced a contrast extravasation that was likely only of the saline flush given the technologist report and quality of contrast bolus. These results were called by telephone at the time of interpretation on 12/20/2018 at 7:33 am to Dr. Ashok Cordia. Review of the MIP images confirms the above findings. IMPRESSION: 1. Moderate pericardial effusion. 2. Negative for pulmonary embolism. 3. Aortic and coronary atherosclerosis. 4. IV extravasation which was likely small volume and primarily saline. Electronically Signed   By: Monte Fantasia M.D.   On: 12/20/2018 07:36    Pending Labs Unresulted Labs (From admission, onward)    Start     Ordered   12/21/18 0938  Basic metabolic panel  Tomorrow morning,   R     12/20/18 0843   12/21/18 0500  Lipid panel  Tomorrow morning,   R     12/20/18 0843   12/21/18 0500  CBC  Tomorrow morning,   R     12/20/18 0843   12/21/18 0500  Protime-INR  Tomorrow morning,   R     12/20/18 0843   12/20/18 0841  Troponin I - Now Then Q6H  Now then every 6 hours,   STAT     12/20/18 0843          Vitals/Pain Today's Vitals  12/20/18 0940 12/20/18 1030 12/20/18 1100 12/20/18 1130  BP:  (!) 146/81 133/78 122/89  Pulse:  84 83 72  Resp:  20 17 17   Temp:      TempSrc:      SpO2:  96% 96% 96%  PainSc: 3        Isolation Precautions No active isolations  Medications Medications  allopurinol (ZYLOPRIM) tablet 100 mg (100 mg Oral Given 12/20/18 1135)  amLODipine (NORVASC) tablet 5 mg (5 mg Oral Given 12/20/18 1000)  atorvastatin (LIPITOR) tablet 20 mg (20 mg Oral Given  12/20/18 1000)  clonazePAM (KLONOPIN) tablet 0.5 mg (has no administration in time range)  insulin aspart (novoLOG) injection 0-15 Units (2 Units Subcutaneous Given 12/20/18 1135)  enoxaparin (LOVENOX) injection 40 mg (has no administration in time range)  sodium chloride flush (NS) 0.9 % injection 3 mL (3 mLs Intravenous Not Given 12/20/18 0953)  sodium chloride flush (NS) 0.9 % injection 3 mL (has no administration in time range)  0.9 %  sodium chloride infusion (has no administration in time range)  aspirin EC tablet 81 mg (has no administration in time range)  nitroGLYCERIN (NITROSTAT) SL tablet 0.4 mg (has no administration in time range)  acetaminophen (TYLENOL) tablet 650 mg (has no administration in time range)  ondansetron (ZOFRAN) injection 4 mg (has no administration in time range)  insulin aspart (novoLOG) injection 0-5 Units (has no administration in time range)  morphine 2 MG/ML injection 2 mg (2 mg Intravenous Given 12/20/18 0920)  metoprolol tartrate (LOPRESSOR) tablet 12.5 mg (12.5 mg Oral Given 12/20/18 1000)  morphine 2 MG/ML injection 2 mg (2 mg Intravenous Given 12/20/18 0558)  ondansetron (ZOFRAN) injection 4 mg (4 mg Intravenous Given 12/20/18 0558)  iohexol (OMNIPAQUE) 350 MG/ML injection 75 mL (75 mLs Intravenous Contrast Given 12/20/18 0653)    Mobility walks with person assist Low fall risk   Focused Assessments Cardiac Assessment Handoff:  Cardiac Rhythm: Atrial fibrillation Lab Results  Component Value Date   TROPONINI <0.03 12/20/2018   No results found for: DDIMER Does the Patient currently have chest pain? Yes / intermittent. Pressure 2/10    R Recommendations: See Admitting Provider Note  Report given to:   Additional Notes:

## 2018-12-20 NOTE — ED Notes (Signed)
CBG collected. Result "123." RN, Hope, notified.

## 2018-12-20 NOTE — ED Provider Notes (Signed)
[facility] EMERGENCY DEPARTMENT Provider Note   CSN: [id number] Arrival date & time: 12/20/18  0431    History   Chief Complaint Chief Complaint  Patient presents with  . Chest Pain    HPI Ian Estes is a 78 y.o. male presenting for evaluation of chest pain.  Patient states last night he was sitting in his recliner when he developed acute onset central chest pain.  He has associated shortness of breath and nausea.  Patient states pain is mostly constant, will ease for a few seconds every once in a while.  Pain is worse with laying flat and exertion.  He took ibuprofen without improvement of his symptoms.  He has not tried anything else.  He denies a history of cardiac problems.  He has never seen a cardiologist before.  He has never been told that he has A. fib or an irregular heartbeat.  He reports daily alcohol use, drinks 3 beers a day, last drink yesterday afternoon.  He denies tobacco or drug use.  He denies history of asthma, COPD, or other lung problems.  He reports a history of hypertension and hyperlipidemia, for which she takes medications.  He states he used to have diabetes, but is now controlled with diet.  He denies recent fevers, chills, cough, abd pain, urinary sxs, or abnormal BMs.   Additional history obtained from chart review, no recent EKG to compare. No previous cardiac workup found.     HPI  Past Medical History:  Diagnosis Date  . Arthritis    "all my joints" (06/13/2014)  . 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 Gout   . High cholesterol   . Hypertension   . Obesity   . Pneumonia 2014 X 1  . Prostate cancer ([place])   . Type II diabetes mellitus [facility])     Patient Active Problem List   Diagnosis Date Noted  . Chronic abdominal pain 06/13/2014  . Hyperlipidemia 06/13/2014  . Acute renal failure ([place]) 06/13/2014  . Dehydration 06/13/2014  . Diabetes mellitus without complication ([place])   .  Hypertension   . High cholesterol     Past Surgical History:  Procedure Laterality Date  . INCISION AND DRAINAGE PERIRECTAL ABSCESS  1990's   "hemorrhoid"  . KNEE ARTHROSCOPY Right ~ 2011  . PROSTATE SURGERY  1996  . SKIN CANCER EXCISION  ~ 2005 X 2   "left foot"        Home Medications    Prior to Admission medications   Medication Sig Start Date End Date Taking? Authorizing Provider  allopurinol (ZYLOPRIM) 100 MG tablet Take 100 mg by mouth daily.   Yes [provider]  amLODipine (NORVASC) 5 MG tablet Take 5 mg by mouth daily.   Yes [provider]  atorvastatin (LIPITOR) 20 MG tablet Take 20 mg by mouth daily.   Yes [provider]  clonazePAM (KLONOPIN) 0.5 MG tablet Take 0.5 mg by mouth at bedtime as needed (sleep).   Yes [provider]  HYDROcodone-acetaminophen (NORCO/VICODIN) 5-325 MG per tablet Take 1 tablet by mouth every 6 (six) hours as needed for moderate pain. Patient not taking: Reported on 07/27/2018 06/14/14   Jonetta Osgood, MD  pantoprazole (PROTONIX) 40 MG tablet Take 1 tablet (40 mg total) by mouth daily at 6 (six) AM. Patient not taking: Reported on 07/27/2018 06/14/14   Jonetta Osgood, MD  pioglitazone (ACTOS) 30 MG tablet Take  1 tablet (30 mg total) by mouth daily. Patient not taking: Reported on 07/27/2018 06/14/14   Jonetta Osgood, MD  polyethylene glycol [facility]) packet Take 17 g by mouth daily as needed for moderate constipation. Patient not taking: Reported on 07/27/2018 06/14/14   Jonetta Osgood, MD  traMADol (ULTRAM) 50 MG tablet Take 1 tablet (50 mg total) by mouth every 6 (six) hours as needed for moderate pain. Patient not taking: Reported on 07/27/2018 06/30/17   Jessy Oto, MD    Family History No family history on file.  Social History Social History   Tobacco Use  . Smoking status: Former Smoker    Packs/day: 1.50    Years: 32.00    Pack years: 48.00    Types: Cigarettes      Last attempt to quit: 08/20/1987    Years since quitting: 31.3  . Smokeless tobacco: Never Used  Substance Use Topics  . Alcohol use: Yes    Alcohol/week: 28.0 standard drinks    Types: 28 Shots of liquor per week    Comment: 06/13/2014 "couple mixed drinks/day; ~ 2 shots in each drink"  . Drug use: No     Allergies   Patient has no known allergies.   Review of Systems Review of Systems  Respiratory: Positive for shortness of breath.   Cardiovascular: Positive for chest pain.  Gastrointestinal: Positive for nausea.  All other systems reviewed and are negative.    Physical Exam Updated Vital Signs BP 137/84 (BP Location: Left Arm)   Pulse 95   Temp 97.8 F (36.6 C) (Oral)   Resp 16   SpO2 96%   Physical Exam Vitals signs and nursing note reviewed.  Constitutional:      General: He is not in acute distress.    Appearance: He is well-developed.     Comments: Elderly male in NAD  HENT:     Head: Normocephalic and atraumatic.  Eyes:     Conjunctiva/sclera: Conjunctivae normal.     Pupils: Pupils are equal, round, and reactive to light.  Neck:     Musculoskeletal: Normal range of motion and neck supple.  Cardiovascular:     Rate and Rhythm: Normal rate. Rhythm irregular.     Pulses: Normal pulses.     Comments: Irregularly irregular Pulmonary:     Effort: Pulmonary effort is normal. No respiratory distress.     Breath sounds: No wheezing.     Comments: Speaking in full sentences. ?mild crackles in bilateral lower bases.  Abdominal:     General: There is no distension.     Palpations: Abdomen is soft. There is no mass.     Tenderness: There is no abdominal tenderness. There is no guarding or rebound.  Musculoskeletal: Normal range of motion.     Comments: No leg pain or swelling. No pitting edema  Skin:    General: Skin is warm and dry.     Capillary Refill: Capillary refill takes less than 2 seconds.  Neurological:     Mental Status: He is alert and  oriented to person, place, and time.      ED Treatments / Results  Labs (all labs ordered are listed, but only abnormal results are displayed) Labs Reviewed  BASIC METABOLIC PANEL - Abnormal; Notable for the following components:      Result Value   Sodium 134 (*)    CO2 17 (*)    Glucose, Bld 164 (*)    GFR calc non Af  Amer 60 (*)    All other components within normal limits  CBC - Abnormal; Notable for the following components:   WBC 12.2 (*)    All other components within normal limits  BRAIN NATRIURETIC PEPTIDE - Abnormal; Notable for the following components:   B Natriuretic Peptide 284.6 (*)    All other components within normal limits  LIPASE, BLOOD  HEPATIC FUNCTION PANEL  I-STAT TROPONIN, ED    EKG EKG Interpretation  Date/Time:  Tuesday December 20 2018 04:38:11 EST Ventricular Rate:  89 PR Interval:    QRS Duration: 72 QT Interval:  382 QTC Calculation: 464 R Axis:   -36 Text Interpretation:  Atrial fibrillation Nonspecific T wave abnormality Confirmed by Dory Horn) on 12/20/2018 4:52:05 AM   Radiology Dg Chest 2 View  Result Date: 12/20/2018 CLINICAL DATA:  Initial evaluation for acute chest pain. EXAM: CHEST - 2 VIEW COMPARISON:  None available. FINDINGS: Moderate cardiomegaly.  Mediastinal silhouette normal. Elevation of the left hemidiaphragm. Associated streaky left basilar opacity favored to reflect atelectasis, although infiltrate could be considered in the correct clinical setting. Trace left pleural effusion. No overt pulmonary edema. No pneumothorax. No acute osseous finding. IMPRESSION: 1. Elevation of left hemidiaphragm with associated streaky left basilar opacity. Atelectasis is favored, although focal infiltrate could be considered in the correct clinical setting. 2. Trace layering left pleural effusion. 3. Cardiomegaly without pulmonary edema. Electronically Signed   By: Jeannine Boga M.D.   On: 12/20/2018 05:21     Procedures Procedures (including critical care time)  Medications Ordered in ED Medications  sodium chloride flush (NS) 0.9 % injection 3 mL (has no administration in time range)  morphine 2 MG/ML injection 2 mg (2 mg Intravenous Given 12/20/18 0558)  ondansetron (ZOFRAN) injection 4 mg (4 mg Intravenous Given 12/20/18 0558)     Initial Impression / Assessment and Plan / ED Course  I have reviewed the triage vital signs and the nursing notes.  Pertinent labs & imaging results that were available during my care of the patient were reviewed by me and considered in my medical decision making (see chart for details).        Pt presenting for evaluation of chest pain. physical exam shows pt in NAD. history concerning with associated SOB and nausea. No previous cardiac workup. EKG with a fib, no recent to compare. Pt unaware of a fib diagnosis, and it is not noted in PCP note. As such, will consider this to be new onset, unknown when it started. Will order labs, cxr, and reassess. As pt drinks regularly, will add lipase.   Labs with mild leukocytosis of 12. Lipase negative. BNP mildly elevated. I considered new onset CHF, however pt without peripheral edema, and CP is pt's main complaint, not sob. trop negative. cxr with trace pleural effusion and streaky base. Doubt PNA, as he is afebrile and not coughing. Case discussed with attending, Dr. Randal Buba evaluated the pt. Will call for admission for CP r/o in the setting of new a fib.   Discussed with Dr. Hal [facility] from [facility], pt to be admitted.   Cha2ds2 vasc: 4.   Final Clinical Impressions(s) / ED Diagnoses   Final diagnoses:  None    ED Discharge Orders    None       Franchot Heidelberg, PA-C 12/20/18 7902    Palumbo, April, MD 12/20/18 [phone]

## 2018-12-20 NOTE — Progress Notes (Signed)
  Echocardiogram 2D Echocardiogram has been performed.  Jennette Dubin 12/20/2018, 11:36 AM

## 2018-12-20 NOTE — ED Notes (Signed)
Ordered a lunch tray for pt.

## 2018-12-20 NOTE — Consult Note (Addendum)
Cardiology Consultation:   Patient ID: Coolidge Gossard; [id number]; 1941/05/18   Admit date: 12/20/2018 Date of Consult: 12/20/2018  Primary Care Provider: Stephens Shire, MD Primary Cardiologist: Minus Breeding, MD (new) Primary Electrophysiologist:  None  Chief Complaint: chest pain  Patient Profile:   Darvin Dials is a 78 y.o. male with a hx of diet controlled DM, prostate CA, HTN, HLD, gout, foot cancer (?see below)  who is being seen today for the evaluation of pericardial effusion and newly recognized atrial fib at the request of Dr. Lorin Mercy.  History of Present Illness:   Mr. Aumiller has no prior cardiac history and considers himself generally healthy. He has prior history as outlined above, including what sounds like remote aspiration of the foot with malignant cells for which he had to go to [facility] to have surgery on the top of his foot. He indicates he was followed for about 6 months thereafter then cleared. He's lost about 40lb over the past 2 years but attributes this to healthier eating. In general he has been in his USOH lately without any recent URI or infective symptoms. Yesterday around 4pm he began to notice central chest discomfort which he describes feeling like his heart was going to blow up while resting in a recliner. It was severe and progressively got worse over the night, keeping him from sleep. He also felt mildly SOB. No n/v/diaphoresis or sweating. He called EMS and they wanted to transfer to [facility] but he requested [facility] instead so had his sister drive him here. He was found to be in newly recognized atrial fib of unknown duration, rate controlled, with no prior history of such. His labs show mild leukocytosis of 12.2, BNP 284, negative troponin x 2, mild hyponatremia of 134, alk phos 144, TSH wnl. CXR shows elevation of L hemidiaphragm with streaky L basilar opacity, trace layering left pleural effusion, and cardiomegaly. CT angio PE protocol showed moderate pericardial  effusion, aortic and coronary atherosclerosis, no PE. Subsequent echocardiogram today shows EF 60-65%, normal RV function, large pleural effusion, large pericardial effusion with large thrombus anterior to RV - cannot rule out a prominent fat pad - however, there is free-flowing fluid both anterior and posterior, making thrombus more likely. There is no evidence of RA or RV diastolic collapse, but the IVC is dilated and does not collapse. There is significant tricuspid valve inflow variation. Mitral inflow pattern is unremarkable. Findings are concerning for early tamponade. He is not tachycardic or hypoxic. He received morphine with improvement in his pain down to very mild, but can be provoked with inspiration.  Past Medical History:  Diagnosis Date  . Arthritis    "all my joints" (06/13/2014)  . 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 Gout   . High cholesterol   . Hypertension   . Obesity   . Pneumonia 2014 X 1  . Prostate cancer ([place])   . Type II diabetes mellitus ([place])     Past Surgical History:  Procedure Laterality Date  . INCISION AND DRAINAGE PERIRECTAL ABSCESS  1990's   "hemorrhoid"  . KNEE ARTHROSCOPY Right ~ 2011  . PROSTATE SURGERY  1996  . SKIN CANCER EXCISION  ~ 2005 X 2   "left foot"     Inpatient Medications: Scheduled Meds: . allopurinol  100 mg Oral Daily  . amLODipine  5 mg Oral Daily  . [START ON 12/21/2018] aspirin EC  81 mg Oral Daily  .  atorvastatin  20 mg Oral Daily  . enoxaparin (LOVENOX) injection  40 mg Subcutaneous Q24H  . insulin aspart  0-15 Units Subcutaneous TID WC  . insulin aspart  0-5 Units Subcutaneous QHS  . metoprolol tartrate  12.5 mg Oral BID  . sodium chloride flush  3 mL Intravenous Q12H   Continuous Infusions: . sodium chloride     PRN Meds: sodium chloride, acetaminophen, clonazePAM, morphine injection, nitroGLYCERIN, ondansetron (ZOFRAN) IV, sodium chloride flush  Home Meds: Prior to Admission  medications   Medication Sig Start Date End Date Taking? Authorizing Provider  allopurinol (ZYLOPRIM) 100 MG tablet Take 100 mg by mouth daily.   Yes [provider]  amLODipine (NORVASC) 5 MG tablet Take 5 mg by mouth daily.   Yes [provider]  atorvastatin (LIPITOR) 20 MG tablet Take 20 mg by mouth daily.   Yes [provider]  clonazePAM (KLONOPIN) 0.5 MG tablet Take 0.5 mg by mouth at bedtime as needed (sleep).   Yes [provider]    Allergies:   No Known Allergies  Social History:   Social History   Socioeconomic History  . Marital status: Married    Spouse name: Not on file  . Number of children: Not on file  . Years of education: Not on file  . Highest education level: Not on file  Occupational History  . Occupation: retired  Scientific laboratory technician  . Financial resource strain: Not on file  . Food insecurity:    Worry: Not on file    Inability: Not on file  . Transportation needs:    Medical: Not on file    Non-medical: Not on file  Tobacco Use  . Smoking status: Former Smoker    Packs/day: 1.50    Years: 32.00    Pack years: 48.00    Types: Cigarettes    Last attempt to quit: 08/20/1987    Years since quitting: 31.3  . Smokeless tobacco: Never Used  Substance and Sexual Activity  . Alcohol use: Yes    Alcohol/week: 21.0 standard drinks    Types: 21 Cans of beer per week    Comment: 3 beers a day, no liquor in 5 years  . Drug use: No  . Sexual activity: Not Currently  Lifestyle  . Physical activity:    Days per week: Not on file    Minutes per session: Not on file  . Stress: Not on file  Relationships  . Social connections:    Talks on phone: Not on file    Gets together: Not on file    Attends religious service: Not on file    Active member of club or organization: Not on file    Attends meetings of clubs or organizations: Not on file    Relationship status: Not on file  . Intimate partner violence:    Fear of current or  ex partner: Not on file    Emotionally abused: Not on file    Physically abused: Not on file    Forced sexual activity: Not on file  Other Topics Concern  . Not on file  Social History Narrative  . Not on file    Family History:   The patient's family history is negative for CAD.  ROS:  Please see the history of present illness.  All other ROS reviewed and negative.     Physical Exam/Data:   Vitals:   12/20/18 0930 12/20/18 1030 12/20/18 1100 12/20/18 1130  BP: 131/82 (!) 146/81 133/78  122/89  Pulse: 80 84 83 72  Resp: (!) '23 20 17 17  '$ Temp:      TempSrc:      SpO2: 96% 96% 96% 96%   No intake or output data in the 24 hours ending 12/20/18 1249 Last 3 Weights 07/27/2018 06/30/2017 06/13/2014  Weight (lbs) 237 lb 250 lb 259 lb 0.7 oz  Weight (kg) 107.502 kg 113.399 kg 117.5 kg    There is no height or weight on file to calculate BMI.  General: Well developed, well nourished obese WM in no acute distress. Head: Normocephalic, atraumatic, sclera non-icteric, no xanthomas, nares are without discharge.  Neck: Negative for carotid bruits. JVD not elevated. Lungs: Clear bilaterally to auscultation without wheezes, rales, or rhonchi. Breathing is unlabored. Heart: Irregularly irregular, rate controlled, with S1 S2. No murmurs, rubs, or gallops appreciated. Abdomen: Soft, rounded with normoactive bowel sounds. No hepatomegaly. No rebound/guarding. No obvious abdominal masses. Msk:  Strength and tone appear normal for age. Extremities: No clubbing or cyanosis. No edema.  Distal pedal pulses are 2+ and equal bilaterally. Neuro: Alert and oriented X 3. No facial asymmetry. No focal deficit. Moves all extremities spontaneously. Psych:  Responds to questions appropriately with a normal affect.  EKG:  The EKG was personally reviewed and demonstrates atrial fib 89bpm nonspecific STT changes  Relevant CV Studies: 2D echo today IMPRESSIONS  1. The left ventricle has normal systolic  function with an ejection fraction of 60-65%. The cavity size was normal. Left ventricular diastolic Doppler parameters are indeterminate secondary to atrial fibrillation.  2. The right ventricle has normal systolic function. The cavity was normal. There is no increase in right ventricular wall thickness.  3. Large pleural effusion.  4. There is excessive respiratory variation in the tricuspid valve spectral Doppler velocities.  5. Large pericardial effusion measuring up to 2.5 cm anteriorly. There appears to be a large thrombus anterior to RV. Cannot rule out a prominent fat pad. However, there is free-flowing fluid both anterior and posterior, making thrombus more likely.  There is no evidence of RA or RV diastolic collapse, but the IVC is dilated and does not collapse. There is significant tricuspid valve inflow variation. Mitral inflow pattern is unremarkable. Findings concerning for early tamponade.  6. The mitral valve is normal in structure.  7. The tricuspid valve is normal in structure.  8. The aortic valve was not well visualized.  9. The pulmonic valve was normal in structure. 10. The inferior vena cava was dilated in size with <50% respiratory variability.   Laboratory Data:  Chemistry Recent Labs  Lab 12/20/18 0444  NA 134*  K 4.1  CL 105  CO2 17*  GLUCOSE 164*  BUN 16  CREATININE 1.16  CALCIUM 8.9  GFRNONAA 60*  GFRAA >60  ANIONGAP 12    Recent Labs  Lab 12/20/18 0444  PROT 7.1  ALBUMIN 3.5  AST 34  ALT 25  ALKPHOS 144*  BILITOT 1.0   Hematology Recent Labs  Lab 12/20/18 0444  WBC 12.2*  RBC 4.97  HGB 15.9  HCT 47.9  MCV 96.4  MCH 32.0  MCHC 33.2  RDW 13.0  PLT 231   Cardiac Enzymes Recent Labs  Lab 12/20/18 0901  TROPONINI <0.03    Recent Labs  Lab 12/20/18 0450  TROPIPOC 0.00    BNP Recent Labs  Lab 12/20/18 0515  BNP 284.6*    DDimer No results for input(s): DDIMER in the last 168 hours.  Radiology/Studies:  Dg Chest  2  View  Result Date: 12/20/2018 CLINICAL DATA:  Initial evaluation for acute chest pain. EXAM: CHEST - 2 VIEW COMPARISON:  None available. FINDINGS: Moderate cardiomegaly.  Mediastinal silhouette normal. Elevation of the left hemidiaphragm. Associated streaky left basilar opacity favored to reflect atelectasis, although infiltrate could be considered in the correct clinical setting. Trace left pleural effusion. No overt pulmonary edema. No pneumothorax. No acute osseous finding. IMPRESSION: 1. Elevation of left hemidiaphragm with associated streaky left basilar opacity. Atelectasis is favored, although focal infiltrate could be considered in the correct clinical setting. 2. Trace layering left pleural effusion. 3. Cardiomegaly without pulmonary edema. Electronically Signed   By: Jeannine Boga M.D.   On: 12/20/2018 05:21   Ct Angio Chest Pe W/cm &/or Wo Cm  Result Date: 12/20/2018 CLINICAL DATA:  Shortness of breath with high pretest probability of pulmonary embolism. EXAM: CT ANGIOGRAPHY CHEST WITH CONTRAST TECHNIQUE: Multidetector CT imaging of the chest was performed using the standard protocol during bolus administration of intravenous contrast. Multiplanar CT image reconstructions and MIPs were obtained to evaluate the vascular anatomy. CONTRAST:  56m OMNIPAQUE IOHEXOL 350 MG/ML SOLN COMPARISON:  None. FINDINGS: Cardiovascular: No cardiomegaly. Moderate pericardial effusion that is low-density. Interpretation of serosal enhancement is not possible due to contrast timing. Negative for pulmonary embolism. Limited opacification of the left heart and systemic arterial tree. Aortic and coronary atherosclerotic calcification. Mediastinum/Nodes: Prominent hilar nodal size, likely reactive. Lungs/Pleura: Bronchomalacia with intermittent airway narrowing. Mild atelectasis greatest in the left lower lobe. There is no edema, consolidation, effusion, or pneumothorax. Upper Abdomen: Negative Musculoskeletal: No  acute or aggressive finding.  Spondylosis. The patient experienced a contrast extravasation that was likely only of the saline flush given the technologist report and quality of contrast bolus. These results were called by telephone at the time of interpretation on 12/20/2018 at 7:33 am to Dr. SAshok Cordia Review of the MIP images confirms the above findings. IMPRESSION: 1. Moderate pericardial effusion. 2. Negative for pulmonary embolism. 3. Aortic and coronary atherosclerosis. 4. IV extravasation which was likely small volume and primarily saline. Electronically Signed   By: JMonte FantasiaM.D.   On: 12/20/2018 07:36    Assessment and Plan:   1. Chest pain with large pericardial effusion with possible thrombus, also pleural effusion - no clear cut precipitating factors to incite pericardial effusion. There have been no recent URI or clear cut signs of malignancy. He does have a history of prostate CA and some sort of foot malignancy. No cough/night sweats to suggest TB or HIV but would be prudent to test. Would also send ESR/CRP for trending. I spoke with radiologist on call Dr. KVernell Barrierto review CTA since this was PE protocol. He feels the study shows no evidence of dissection. Blood pressures are very near equal equal bilaterally ([id number] [id number]). I did review with [facility] APP and they are planning to get formal CT angio chest/abd/pelvis protocol to formally exclude aortic dissection given clinical history of fairly acute onset of pain. Ultimately would probably benefit from a therapeutic modality to send for pathology to exclude neoplastic cause. I will discuss in depth with Dr. HPercival Spanish Clinically he does not appear to be in tamponade. This does not appear to be an ACS. Stop ASA given possible hemorrhagic effusion. Can use SCDs for DVT ppx for now.  2. Newly recognized atrial fibrillation, rate controlled - CHADSVASCis 4 for HTN, age x 2, DM, vascular disease (coronary/aortic calcification in imaging) so  will need to consider long term anticoagulation.  However, pericardial effusion may be hemorrhagic given the possibility of surrounding thrombus. He does not presently need any meds for rate control. Hold off anticoagulation pending eval of #1.   3. Essential HTN - BP controlled.  4. Hyperlipidemia - on home statin. Lipid profile scheduled for AM to assess risk reduction given coronary/aortic atherosclerosis.  For questions or updates, please contact [place] Please consult [email or web address] for contact info under Cardiology/STEMI.    Signed, Charlie Pitter, PA-C  12/20/2018 12:49 PM   History and all data above reviewed.  Patient examined.  The patient presents with 1 days worth of chest discomfort.  He is actually been well prior to yesterday.  He did not have any cardiovascular history or symptoms.  He started to develop some chest discomfort.  He said this was moderate ache.  It was slightly positional.  There was no associated nausea vomiting or diaphoresis.  It is substernal.  It did not radiate to his jaw or to his arms.  He was having some mild shortness of breath and had to sleep in a chair yesterday fairly comfortable.  He has not had any fevers or chills.  He has had no cough.  Is had no edema.  He has had no palpitations, presyncope or syncope.  He came to the emergency room and was noted on CT not to have a PE but to have pericardial effusion.  Echocardiogram demonstrates pericardial effusion with density possibly thrombus.  There is some RV collapse but no evidence of tamponade physiology otherwise.  He is hemodynamically stable.  We are called to see him for evaluation.  I agree with the findings as above.  The patient exam reveals COR:RRR, no rubs  ,  Lungs: Clear  ,  Abd: Positive bowel sounds, no rebound no guarding, Ext No edema  .  All available labs, radiology testing, previous records reviewed. Agree with documented assessment and plan.   PERICARDIAL EFFUSION: He has pericardial  effusion as described.  There is intra-pericardial mass which could be thrombus although the etiology is not clear.  He is going to have complete CT of his aorta to rule out dissection although I think this is less likely.  He has not had longstanding symptoms to suggest pericardial effusion although this could be pericardial effusion with hemorrhagic transformation.  Because of this we will hold off on anticoagulation.  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  This could be associated.  Ultimately given the appearance of this fluid he may need pericardial window with evacuation of fluid/material.  Atrial fib:  Incidental finding.  The patient did not feel this.  Holding off on anticoagulation until after pericardial window.   Jeneen Rinks Kijuana Ruppel  3:40 PM  12/20/2018

## 2018-12-20 NOTE — H&P (Signed)
History and Physical    Akoni Parton GGE:[id number] DOB: Nov 01, 1940 DOA: 12/20/2018  PCP: Deatra Ina Consultants:  [place] - dermatology Patient coming from:  Home - lives with wife; NOK: Wife or son, [phone]  Chief Complaint: chest pain  HPI: Ian Estes is a 78 y.o. male with medical history significant of DM; prostate CA; HTN; and HLD presenting with chest pain. He noticed chest pain.  "It just felt like it wanted to blow up"  It started at rest yesterday afternoon.  Morphine may have made it better - it still hurts but is better than it was.   The pain substernal in nature.  It was not changed with exertion.  It was constant.  At its worst, it was 7/10; currently it is 4/10.  Associated with SOB.  No radiation.  No diaphoresis.  Nausea x 1.  He has not had a stress test in many years.   ED Course:  Carryover, per Dr. Alcario Drought:  79 yo M CP rule out. CP, 1 episode of vomiting. EKG shows A.Fib which seems to be new, HR in 80s. H/o diet controlled DM, HTN, HLD.  Review of Systems: As per HPI; otherwise review of systems reviewed and negative.   Ambulatory Status:  Ambulates without assistance  Past Medical History:  Diagnosis Date  . Arthritis    "all my joints" (06/13/2014)  . 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 Gout   . High cholesterol   . Hypertension   . Obesity   . Pneumonia 2014 X 1  . Prostate cancer ([place])   . Type II diabetes mellitus ([place])     Past Surgical History:  Procedure Laterality Date  . INCISION AND DRAINAGE PERIRECTAL ABSCESS  1990's   "hemorrhoid"  . KNEE ARTHROSCOPY Right ~ 2011  . PROSTATE SURGERY  1996  . SKIN CANCER EXCISION  ~ 2005 X 2   "left foot"    Social History   Socioeconomic History  . Marital status: Married    Spouse name: Not on file  . Number of children: Not on file  . Years of education: Not on file  . Highest education level: Not on file  Occupational History  . Occupation: retired    Scientific laboratory technician  . Financial resource strain: Not on file  . Food insecurity:    Worry: Not on file    Inability: Not on file  . Transportation needs:    Medical: Not on file    Non-medical: Not on file  Tobacco Use  . Smoking status: Former Smoker    Packs/day: 1.50    Years: 32.00    Pack years: 48.00    Types: Cigarettes    Last attempt to quit: 08/20/1987    Years since quitting: 31.3  . Smokeless tobacco: Never Used  Substance and Sexual Activity  . Alcohol use: Yes    Alcohol/week: 21.0 standard drinks    Types: 21 Cans of beer per week    Comment: 3 beers a day, no liquor in 5 years  . Drug use: No  . Sexual activity: Not Currently  Lifestyle  . Physical activity:    Days per week: Not on file    Minutes per session: Not on file  . Stress: Not on file  Relationships  . Social connections:    Talks on phone: Not on file    Gets together: Not on file    Attends religious service: Not on  file    Active member of club or organization: Not on file    Attends meetings of clubs or organizations: Not on file    Relationship status: Not on file  . Intimate partner violence:    Fear of current or ex partner: Not on file    Emotionally abused: Not on file    Physically abused: Not on file    Forced sexual activity: Not on file  Other Topics Concern  . Not on file  Social History Narrative  . Not on file    No Known Allergies  Family History  Problem Relation Age of Onset  . CAD Neg Hx     Prior to Admission medications   Medication Sig Start Date End Date Taking? Authorizing Provider  allopurinol (ZYLOPRIM) 100 MG tablet Take 100 mg by mouth daily.   Yes [provider]  amLODipine (NORVASC) 5 MG tablet Take 5 mg by mouth daily.   Yes [provider]  atorvastatin (LIPITOR) 20 MG tablet Take 20 mg by mouth daily.   Yes [provider]  clonazePAM (KLONOPIN) 0.5 MG tablet Take 0.5 mg by mouth at bedtime as needed (sleep).   Yes [provider]  HYDROcodone-acetaminophen (NORCO/VICODIN) 5-325 MG per tablet Take 1 tablet by mouth every 6 (six) hours as needed for moderate pain. Patient not taking: Reported on 07/27/2018 06/14/14   Jonetta Osgood, MD  pantoprazole (PROTONIX) 40 MG tablet Take 1 tablet (40 mg total) by mouth daily at 6 (six) AM. Patient not taking: Reported on 07/27/2018 06/14/14   Jonetta Osgood, MD  pioglitazone (ACTOS) 30 MG tablet Take 1 tablet (30 mg total) by mouth daily. Patient not taking: Reported on 07/27/2018 06/14/14   Jonetta Osgood, MD  polyethylene glycol [facility] Floria Raveling) packet Take 17 g by mouth daily as needed for moderate constipation. Patient not taking: Reported on 07/27/2018 06/14/14   Jonetta Osgood, MD  traMADol (ULTRAM) 50 MG tablet Take 1 tablet (50 mg total) by mouth every 6 (six) hours as needed for moderate pain. Patient not taking: Reported on 07/27/2018 06/30/17   Jessy Oto, MD    Physical Exam: Vitals:   12/20/18 1314 12/20/18 1400 12/20/18 1500 12/20/18 1608  BP: 119/71 132/72 (!) 132/55 (!) 145/75  Pulse:  73 74 73  Resp:  19 (!) 22 19  Temp:    97.9 F (36.6 C)  TempSrc:    Oral  SpO2:  96% 96% 94%  Weight:    103.9 kg  Height:    6' (1.829 m)     . General:  Appears calm and comfortable and is NAD; mostly concerned about wanting discharge to home tomorrow . Eyes:  PERRL, EOMI, normal lids, iris . ENT:  grossly normal hearing, lips & tongue, mmm . Neck:  no LAD, masses or thyromegaly; no carotid bruits . Cardiovascular:  RRR, no m/r/g. No LE edema.  Marland Kitchen Respiratory:   CTA bilaterally with no wheezes/rales/rhonchi.  Normal respiratory effort. . Abdomen:  soft, NT, ND, NABS . Back:   normal alignment, no CVAT . Skin:  no rash or induration seen on limited exam . Musculoskeletal:  grossly normal tone BUE/BLE, good ROM, no bony abnormality . Psychiatric:  grossly normal mood and affect, speech fluent and appropriate, AOx3 . Neurologic:  CN 2-12  grossly intact, moves all extremities in coordinated fashion, sensation intact    Radiological Exams on Admission: Dg Chest 2 View  Result Date: 12/20/2018 CLINICAL  DATA:  Initial evaluation for acute chest pain. EXAM: CHEST - 2 VIEW COMPARISON:  None available. FINDINGS: Moderate cardiomegaly.  Mediastinal silhouette normal. Elevation of the left hemidiaphragm. Associated streaky left basilar opacity favored to reflect atelectasis, although infiltrate could be considered in the correct clinical setting. Trace left pleural effusion. No overt pulmonary edema. No pneumothorax. No acute osseous finding. IMPRESSION: 1. Elevation of left hemidiaphragm with associated streaky left basilar opacity. Atelectasis is favored, although focal infiltrate could be considered in the correct clinical setting. 2. Trace layering left pleural effusion. 3. Cardiomegaly without pulmonary edema. Electronically Signed   By: Jeannine Boga M.D.   On: 12/20/2018 05:21   Ct Angio Chest Pe W/cm &/or Wo Cm  Result Date: 12/20/2018 CLINICAL DATA:  Shortness of breath with high pretest probability of pulmonary embolism. EXAM: CT ANGIOGRAPHY CHEST WITH CONTRAST TECHNIQUE: Multidetector CT imaging of the chest was performed using the standard protocol during bolus administration of intravenous contrast. Multiplanar CT image reconstructions and MIPs were obtained to evaluate the vascular anatomy. CONTRAST:  48mL OMNIPAQUE IOHEXOL 350 MG/ML SOLN COMPARISON:  None. FINDINGS: Cardiovascular: No cardiomegaly. Moderate pericardial effusion that is low-density. Interpretation of serosal enhancement is not possible due to contrast timing. Negative for pulmonary embolism. Limited opacification of the left heart and systemic arterial tree. Aortic and coronary atherosclerotic calcification. Mediastinum/Nodes: Prominent hilar nodal size, likely reactive. Lungs/Pleura: Bronchomalacia with intermittent airway narrowing. Mild atelectasis greatest  in the left lower lobe. There is no edema, consolidation, effusion, or pneumothorax. Upper Abdomen: Negative Musculoskeletal: No acute or aggressive finding.  Spondylosis. The patient experienced a contrast extravasation that was likely only of the saline flush given the technologist report and quality of contrast bolus. These results were called by telephone at the time of interpretation on 12/20/2018 at 7:33 am to Dr. Ashok Cordia. Review of the MIP images confirms the above findings. IMPRESSION: 1. Moderate pericardial effusion. 2. Negative for pulmonary embolism. 3. Aortic and coronary atherosclerosis. 4. IV extravasation which was likely small volume and primarily saline. Electronically Signed   By: Monte Fantasia M.D.   On: 12/20/2018 07:36   Ct Angio Chest/abd/pel For Dissection W And/or W/wo  Result Date: 12/20/2018 CLINICAL DATA:  78 year old male with history of chest pain. EXAM: CT ANGIOGRAPHY CHEST, ABDOMEN AND PELVIS TECHNIQUE: Multidetector CT imaging through the chest, abdomen and pelvis was performed using the standard protocol during bolus administration of intravenous contrast. Multiplanar reconstructed images and MIPs were obtained and reviewed to evaluate the vascular anatomy. CONTRAST:  100 mL of Isovue 370. COMPARISON:  Chest CTA 01/16/2019. FINDINGS: CTA CHEST FINDINGS Cardiovascular: On precontrast images there is no crescentic high attenuation associated with the wall of the thoracic aorta to suggest an acute intramural hemorrhage. Post-contrast images demonstrate no evidence of thoracic aortic aneurysm or dissection. There is aortic atherosclerosis, as well as atherosclerosis of the great vessels of the mediastinum and the coronary arteries, including calcified atherosclerotic plaque in the left main, left anterior descending, left circumflex and right coronary arteries. Heart size is normal. Moderate volume of intermediate to high attenuation pericardial fluid, concerning for an acute  pericarditis with proteinaceous exudate of fluid. No pericardial calcification. Mediastinum/Nodes: No pathologically enlarged mediastinal or hilar lymph nodes. Esophagus is unremarkable in appearance. No axillary lymphadenopathy. Lungs/Pleura: No suspicious appearing pulmonary nodules or masses are noted. No acute consolidative airspace disease. No pleural effusions. Musculoskeletal: There are no aggressive appearing lytic or blastic lesions noted in the visualized portions of the skeleton. Review of the MIP images  confirms the above findings. CTA ABDOMEN AND PELVIS FINDINGS VASCULAR Aorta: Aortic atherosclerosis. Normal caliber aorta without aneurysm, dissection, vasculitis or significant stenosis. Celiac: Patent without evidence of aneurysm, dissection or vasculitis. Mild stenosis at the ostium. SMA: Patent without evidence of aneurysm, dissection, vasculitis or significant stenosis. Renals: Both renal arteries are patent without evidence of aneurysm, dissection, vasculitis, fibromuscular dysplasia or significant stenosis. Large accessory artery to the lower pole the left kidney IMA: Patent without evidence of aneurysm, dissection, vasculitis or significant stenosis. Inflow: Patent without evidence of aneurysm, dissection, vasculitis or significant stenosis. Veins: No obvious venous abnormality within the limitations of this arterial phase study. Review of the MIP images confirms the above findings. NON-VASCULAR Hepatobiliary: No suspicious cystic or solid hepatic lesions. No intra or extrahepatic biliary ductal dilatation. Gallbladder is moderately distended, but there are no definite gallstones or surrounding inflammatory changes to suggest an acute cholecystitis at this time. Pancreas: No pancreatic mass. No pancreatic ductal dilatation. No pancreatic or peripancreatic fluid or inflammatory changes. Spleen: Unremarkable. Adrenals/Urinary Tract: 1.6 cm low-attenuation lesion in the interpolar region of the left  kidney, compatible with a simple cyst. Multifocal cortical thinning in both kidneys. Mild adrenal form thickening of both adrenal glands without discrete mass. No hydroureteronephrosis. Urinary bladder is unremarkable in appearance. Stomach/Bowel: Normal appearance of the stomach. No pathologic dilatation of small bowel or colon. A few scattered colonic diverticulae are noted, without surrounding inflammatory changes to suggest an acute diverticulitis at this time. Normal appendix. Lymphatic: No pathologically enlarged lymph nodes are noted in the abdomen or pelvis. Reproductive: Status post prostatectomy. Other: No significant volume of ascites.  No pneumoperitoneum. Musculoskeletal: There are no aggressive appearing lytic or blastic lesions noted in the visualized portions of the skeleton. Review of the MIP images confirms the above findings. IMPRESSION: 1. Moderate volume of high attenuation pericardial fluid, likely reflective of an acute pericarditis. This likely accounts for the patient's history of chest pain. 2. No acute abnormality of the thoracoabdominal aorta. Specifically, no findings to suggest an acute aortic syndrome. 3. Aortic atherosclerosis, in addition to left main and 3 vessel coronary artery disease. Assessment for potential risk factor modification, dietary therapy or pharmacologic therapy may be warranted, if clinically indicated. 4. Colonic diverticulosis without evidence of acute diverticulitis at this time. 5. Additional incidental findings, as above. Electronically Signed   By: Vinnie Langton M.D.   On: 12/20/2018 14:49    EKG: Independently reviewed.  Afib with rate 89; nonspecific ST changes with no evidence of acute ischemia   Labs on Admission: I have personally reviewed the available labs and imaging studies at the time of the admission.  Pertinent labs:   CO2 17 Glucose 164 AP 144 BNP 284.6 Troponin 0.00 WBC 12.2   Assessment/Plan Principal Problem:   Chest  pain Active Problems:   Diabetes mellitus without complication (HCC)   Hypertension   Hyperlipidemia   Unspecified atrial fibrillation (HCC)   Pericardial effusion   Chest pain -Patient with substernal chest pressure that has been continuous since yesterday afternoon, no exertional component. -CXR unremarkable.   -CTA done that showed a moderate pericardial effusion as well as aortic and coronary atherosclerosis. -Initial cardiac troponin negative.  -EKG not indicative of acute ischemia.   -Will plan to place in observation status on telemetry to rule out ACS by overnight observation.  -cycle troponin q6h x 3 and repeat EKG in AM -Start ASA 81 mg  daily -morphine given -Risk factor stratification with HgbA1c and FLP; will also  check TSH -Cardiology consultation  New-onset afib -Patient presenting with new-onset afib with rate control -Heart rate is well controlled despite no medication -CHA2DS2-VASc Score is >2 and so patient will need oral anticoagulation.  -Anticoagulation was not started, but was discussed with the patient. -Echo ordered for further evaluation -I received a message from Dr. 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 -No AC due to risk of hemorrhagic transformation -CTA repeated, no evidence of dissection -Plan for pericardial window for now, but this may become a more extensive surgery due to the mass/thrombus  HTN -Takes Norvasc monotherapy at home, will continue for now  HLD -Continue Lipitor -Lipids were checked in 8/15 (TC 165, HDL 52, LDL 83, TG 148); will recheck  DM -A1c is 5.6, off medications -There is no indication to start medication at this time, but will cover with SSI for now  DVT prophylaxis:  SCDs Code Status:  DNR - confirmed with patient Family Communication: Sister was present throughout evaluation Disposition Plan:  Home once clinically improved Consults called: Cardiology; CT surgery Admission status: Admit - It is my clinical opinion that admission to INPATIENT is reasonable and necessary because this patient will require at least 2 midnights in the hospital to treat this condition based on the medical complexity of the problems presented.  Given the aforementioned information, the predictability of an adverse outcome is felt to be significant.   *The patient was initially placed in Observation status.  However, given presence of pericardial window with plan for at least pericardial window, he will be changed to inpatient status at this time.    Karmen Bongo MD [facility]   How to contact the [facility] Attending or Consulting provider [place] or covering provider during after hours [place], for this patient?  1. Check the care team in [facility] and look for a) attending/consulting [facility] provider listed and b) the [facility] team listed 2. Log into [email or web address] and use [place]'s universal password to access. If you do not have the password, please contact the hospital operator. 3. Locate the [facility] provider you are looking for under [facility] and page to a number that you can be directly reached. 4. If you still have difficulty reaching the provider, please page the [facility] (Director on Call) for the Hospitalists listed on amion for assistance.   12/20/2018, 6:05 PM

## 2018-12-20 NOTE — ED Triage Notes (Signed)
To ED for eval of mid-sternal cp starting yesterday afternoon while sitting in recliner. States feels like his chest is going to explode. [facility] to the home this am for eval and told pt they would take him to [facility]- pt requested [facility] and they refused so pt had sister drive him to [place]. Nausea, no vomiting. Pt feels a little clammy. Pain does not radiate. No asprin taken pta.

## 2018-12-20 NOTE — Consult Note (Signed)
[address]       [place] [address]             [phone]        Cary Hargett [facility] Medical Record #[id number] Date of Birth: [birth year]  Referring: Dr. Percival Spanish, MD Primary Care: Stephens Shire, MD Primary Cardiologist:James Percival Spanish, MD  Chief Complaint:    Chief Complaint  Patient presents with  . Chest Pain  Reason for consultation: Large pericardial effusion and possible large thrombus anterior to the RV  History of Present Illness:     This is a 78 year old male with a past medical history of hypertension, hyperlipidemia, diet controlled diabetes mellitus, prostate cancer, gout, and skin cancer left foot who had acute onset of substernal chest pain. Initially, the patient had summonsed [facility] who wanted him to go to [facility] the patient wanted to go to [facility] so he had his sister take him to [facility] ED directly. He described the chest pain as burning, deep pressure like sensation, substernal, and was associated with shortness of breath. He did have one episode of nausea as well. Symptoms started suddenly yesterday afternoon. He denied pain radiation and diaphoresis. EKG showed non specific T wave abnormality and atrial fibrillation. Initial Troponin 0 and subsequent Troponins have been ordered. BNP 284.6. CXR  showed elevation of the left hemidiaphragm, opacity left base (? atelectasis, small pleural effusion), and cardiomegaly. CT angio of chest without contrast showed moderate pericardial effusion, no PE, and aortic and coronary atherosclerosis. Echo then showed LVEF 60-65% and large pericardial effusion measuring up to 2.5 cm anteriorly. There appears to be a large thrombus anterior to RV (cannot rule out a prominent fat pad.)  Dr. Prescott Gum has been consulted regarding further evaluation and treatment of large pericardial effusion and large thrombus anterior to RV. Currently, the patient is no acute distress, is in a fib  with HR 110's, and BP 124/72.  Current Activity/ Functional Status: Patient is independent with mobility/ambulation, transfers, ADL's, IADL's.   Zubrod Score: At the time of surgery this patient's most appropriate activity status/level should be described as: []     0    Normal activity, no symptoms [x]     1    Restricted in physical strenuous activity but ambulatory, able to do out light work []     2    Ambulatory and capable of self care, unable to do work activities, up and about  more than 50%  Of the time                            []     3    Only limited self care, in bed greater than 50% of waking hours []     4    Completely disabled, no self care, confined to bed or chair []     5    Moribund  Past Medical History:  Diagnosis Date  . Arthritis    "all my joints" (06/13/2014)  . 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 Gout   . High cholesterol   . Hypertension   . Obesity   . Pneumonia 2014 X 1  . Prostate cancer ([place])   . Type II diabetes mellitus ([place])     Past Surgical History:  Procedure Laterality Date  . INCISION AND DRAINAGE PERIRECTAL ABSCESS  1990's   "  hemorrhoid"  . KNEE ARTHROSCOPY Right ~ 2011  . PROSTATE SURGERY  1996  . SKIN CANCER EXCISION  ~ 2005 X 2   "left foot"    Social History   Tobacco Use  Smoking Status Former Smoker  . Packs/day: 1.50  . Years: 32.00  . Pack years: 48.00  . Types: Cigarettes  . Last attempt to quit: 08/20/1987  . Years since quitting: 31.3  Smokeless Tobacco Never Used    Social History   Substance and Sexual Activity  Alcohol Use Yes  . Alcohol/week: 21.0 standard drinks  . Types: 21 Cans of beer per week   Comment: 3 beers a day, no liquor in 5 years    Allergies: No Known Allergies  Current Facility-Administered Medications  Medication Dose Route Frequency Provider Last Rate Last Dose  . 0.9 %  sodium chloride infusion  250 mL Intravenous PRN Karmen Bongo, MD      .  acetaminophen (TYLENOL) tablet 650 mg  650 mg Oral Q4H PRN Karmen Bongo, MD      . allopurinol (ZYLOPRIM) tablet 100 mg  100 mg Oral Daily Karmen Bongo, MD   100 mg at 12/20/18 1135  . amLODipine (NORVASC) tablet 5 mg  5 mg Oral Daily Karmen Bongo, MD   5 mg at 12/20/18 1000  . [START ON 12/21/2018] aspirin EC tablet 81 mg  81 mg Oral Daily Karmen Bongo, MD      . atorvastatin (LIPITOR) tablet 20 mg  20 mg Oral Daily Karmen Bongo, MD   20 mg at 12/20/18 1000  . clonazePAM (KLONOPIN) tablet 0.5 mg  0.5 mg Oral QHS PRN Karmen Bongo, MD      . enoxaparin (LOVENOX) injection 40 mg  40 mg Subcutaneous Q24H Karmen Bongo, MD      . insulin aspart (novoLOG) injection 0-15 Units  0-15 Units Subcutaneous TID WC Karmen Bongo, MD   2 Units at 12/20/18 1135  . insulin aspart (novoLOG) injection 0-5 Units  0-5 Units Subcutaneous QHS Karmen Bongo, MD      . metoprolol tartrate (LOPRESSOR) tablet 12.5 mg  12.5 mg Oral BID Karmen Bongo, MD   12.5 mg at 12/20/18 1000  . morphine 2 MG/ML injection 2 mg  2 mg Intravenous Q2H PRN Karmen Bongo, MD   2 mg at 12/20/18 0920  . nitroGLYCERIN (NITROSTAT) SL tablet 0.4 mg  0.4 mg Sublingual Q5 Min x 3 PRN Karmen Bongo, MD      . ondansetron [facility]) injection 4 mg  4 mg Intravenous Q6H PRN Karmen Bongo, MD      . sodium chloride flush (NS) 0.9 % injection 3 mL  3 mL Intravenous Q12H Karmen Bongo, MD      . sodium chloride flush (NS) 0.9 % injection 3 mL  3 mL Intravenous PRN Karmen Bongo, MD       Current Outpatient Medications  Medication Sig Dispense Refill  . allopurinol (ZYLOPRIM) 100 MG tablet Take 100 mg by mouth daily.    Marland Kitchen amLODipine (NORVASC) 5 MG tablet Take 5 mg by mouth daily.    Marland Kitchen atorvastatin (LIPITOR) 20 MG tablet Take 20 mg by mouth daily.    . clonazePAM (KLONOPIN) 0.5 MG tablet Take 0.5 mg by mouth at bedtime as needed (sleep).      Family History  Problem Relation Age of Onset  . CAD Neg Hx   Mother died  at age 89 Father died at age 58, brain tumor Patient has 2 living sisters  Review of Systems:    Cardiac Review of Systems: Y or  [   N ]= no  Chest Pain [   Not at time of exam ] Orthopnea [ N ]   Pedal Edema [ N  ]    Syncope  [ N ]   Presyncope [  N ]  General Review of Systems: [Y] = yes [  ]=no Constitional:  nausea [ Y-one episode ]; night sweats Aqua.Slicker  ]; fever [ N ]; or chills Aqua.Slicker  ]                                                               Eye :  diplopia [ N  ];   Amaurosis fugax[ N ]; Resp: cough [ N ];  wheezing[N  ];  hemoptysis[ N ];   GI:   vomiting[ N ];   melena[N  ];  hematochezia Aqua.Slicker  ];  GU: hematuria[ N ];                Skin: rash, swelling[ N  ];,  peripheral edema[N  ];   Musculosketetal: myalgias[N  ];  back pain[N  ];  Heme/Lymph:   bleeding[ N ];  anemia[ N ];  Neuro: TIA[ N ];   stroke[ N ];    Psych:depression[ N ]; anxiety[N  ];  Endocrine: diabetes[ Y-diet controlled ];  thyroid dysfunction[N  ];               Physical Exam: BP 119/71 (BP Location: Left Arm)   Pulse 78   Temp 97.8 F (36.6 C) (Oral)   Resp 20   SpO2 94%    General appearance: alert, cooperative and no distress Head: Normocephalic, without obvious abnormality, atraumatic Resp: clear to auscultation bilaterally Cardio: IRRR IRRR, no murmur GI: Soft, protuberant, non tender, bowel sounds present Extremities: No LE edema, palpable DP bilaterally Neurologic: Grossly normal  Diagnostic Studies & Laboratory data:     Recent Radiology Findings:   Dg Chest 2 View  Result Date: 12/20/2018 CLINICAL DATA:  Initial evaluation for acute chest pain. EXAM: CHEST - 2 VIEW COMPARISON:  None available. FINDINGS: Moderate cardiomegaly.  Mediastinal silhouette normal. Elevation of the left hemidiaphragm. Associated streaky left basilar opacity favored to reflect atelectasis, although infiltrate could be considered in the correct clinical setting. Trace left pleural effusion. No overt pulmonary  edema. No pneumothorax. No acute osseous finding. IMPRESSION: 1. Elevation of left hemidiaphragm with associated streaky left basilar opacity. Atelectasis is favored, although focal infiltrate could be considered in the correct clinical setting. 2. Trace layering left pleural effusion. 3. Cardiomegaly without pulmonary edema. Electronically Signed   By: Jeannine Boga M.D.   On: 12/20/2018 05:21   Ct Angio Chest Pe W/cm &/or Wo Cm  Result Date: 12/20/2018 CLINICAL DATA:  Shortness of breath with high pretest probability of pulmonary embolism. EXAM: CT ANGIOGRAPHY CHEST WITH CONTRAST TECHNIQUE: Multidetector CT imaging of the chest was performed using the standard protocol during bolus administration of intravenous contrast. Multiplanar CT image reconstructions and MIPs were obtained to evaluate the vascular anatomy. CONTRAST:  1mL OMNIPAQUE IOHEXOL 350 MG/ML SOLN COMPARISON:  None. FINDINGS: Cardiovascular: No cardiomegaly. Moderate pericardial effusion that is low-density. Interpretation of serosal enhancement is not possible due to contrast  timing. Negative for pulmonary embolism. Limited opacification of the left heart and systemic arterial tree. Aortic and coronary atherosclerotic calcification. Mediastinum/Nodes: Prominent hilar nodal size, likely reactive. Lungs/Pleura: Bronchomalacia with intermittent airway narrowing. Mild atelectasis greatest in the left lower lobe. There is no edema, consolidation, effusion, or pneumothorax. Upper Abdomen: Negative Musculoskeletal: No acute or aggressive finding.  Spondylosis. The patient experienced a contrast extravasation that was likely only of the saline flush given the technologist report and quality of contrast bolus. These results were called by telephone at the time of interpretation on 12/20/2018 at 7:33 am to Dr. Ashok Cordia. Review of the MIP images confirms the above findings. IMPRESSION: 1. Moderate pericardial effusion. 2. Negative for pulmonary embolism.  3. Aortic and coronary atherosclerosis. 4. IV extravasation which was likely small volume and primarily saline. Electronically Signed   By: Monte Fantasia M.D.   On: 12/20/2018 07:36     I have independently reviewed the above radiologic studies and discussed with the patient   Recent Lab Findings: Lab Results  Component Value Date   WBC 12.2 (H) 12/20/2018   HGB 15.9 12/20/2018   HCT 47.9 12/20/2018   PLT 231 12/20/2018   GLUCOSE 164 (H) 12/20/2018   CHOL 165 06/13/2014   TRIG 148 06/13/2014   HDL 52 06/13/2014   LDLCALC 83 06/13/2014   ALT 25 12/20/2018   AST 34 12/20/2018   NA 134 (L) 12/20/2018   K 4.1 12/20/2018   CL 105 12/20/2018   CREATININE 1.16 12/20/2018   BUN 16 12/20/2018   CO2 17 (L) 12/20/2018   TSH 1.785 12/20/2018   HGBA1C 5.6 12/20/2018   Assessment / Plan:   1. Moderate pericardial effusion, possible  thrombus anterior to the RV.  CT of chest done without contrast which ruled out PE. As discussed with Dr. Prescott Gum, CT chest/abd/pelvis ordered to rule out aortic dissection. Dr. Prescott Gum has reviewed recent CT angio with contrast and there is NO aortic dissection. . As discussed with Dr. Prescott Gum, will make patient NPO after midnight and consent for subxiphoid pericardial window. Dr. Prescott Gum to further evaluate and provide any other surgical recommendation. 2. New onset atrial fibrillation-CHADSVASC 4. No anticoagulation for now. 3. Hypertension-on Amlodipine 5 mg daily, Lopressor 12.5 mg bid 4. Hyperlipidemia-on Atorvastatin 20 mg daily 5. Diet controlled iabetes-accu checks, SS PRN. HGA1C 5.6   I  spent 20 minutes counseling the patient face to face.   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

## 2018-12-20 NOTE — Progress Notes (Signed)
Per d/w Lars Pinks, CVTS anticipates probable subxiphoid window tomorrow. Update given to Dr. Percival Spanish - OK to eat from our perspective. Diet order placed. Dayna Dunn PA-C

## 2018-12-20 NOTE — ED Notes (Signed)
Pt back from ECHO.

## 2018-12-21 ENCOUNTER — Inpatient Hospital Stay (HOSPITAL_COMMUNITY): Payer: Medicare HMO | Admitting: Anesthesiology

## 2018-12-21 ENCOUNTER — Inpatient Hospital Stay (HOSPITAL_COMMUNITY): Payer: Medicare HMO

## 2018-12-21 ENCOUNTER — Encounter (HOSPITAL_COMMUNITY): Payer: Self-pay | Admitting: Orthopedic Surgery

## 2018-12-21 ENCOUNTER — Encounter (HOSPITAL_COMMUNITY)
Admission: EM | Disposition: A | Discharge status: Home / Self Care | Payer: Self-pay | Source: Home / Self Care | Attending: Cardiothoracic Surgery

## 2018-12-21 DIAGNOSIS — Z9889 Other specified postprocedural states: Secondary | ICD-10-CM | POA: Diagnosis present

## 2018-12-21 HISTORY — PX: TEE WITHOUT CARDIOVERSION: SHX5443

## 2018-12-21 HISTORY — PX: SUBXYPHOID PERICARDIAL WINDOW: SHX5075

## 2018-12-21 LAB — ECHO INTRAOPERATIVE TEE
Height: 72.008 in
Weight: 3664.01 oz

## 2018-12-21 LAB — BLOOD GAS, ARTERIAL
Acid-base deficit: 1 mmol/L (ref 0.0–2.0)
Bicarbonate: 22.5 mmol/L (ref 20.0–28.0)
Drawn by: 350431
FIO2: 0.21
O2 Saturation: 95.4 %
Patient temperature: 98.1
pCO2 arterial: 32.8 mmHg (ref 32.0–48.0)
pH, Arterial: 7.45 (ref 7.350–7.450)
pO2, Arterial: 74.5 mmHg — ABNORMAL LOW (ref 83.0–108.0)

## 2018-12-21 LAB — URINALYSIS, ROUTINE W REFLEX MICROSCOPIC
Bilirubin Urine: NEGATIVE
Glucose, UA: NEGATIVE mg/dL
Hgb urine dipstick: NEGATIVE
Ketones, ur: NEGATIVE mg/dL
Leukocytes,Ua: NEGATIVE
Nitrite: NEGATIVE
Protein, ur: NEGATIVE mg/dL
Specific Gravity, Urine: 1.036 — ABNORMAL HIGH (ref 1.005–1.030)
pH: 5 (ref 5.0–8.0)

## 2018-12-21 LAB — BASIC METABOLIC PANEL
Anion gap: 7 (ref 5–15)
BUN: 14 mg/dL (ref 8–23)
CO2: 23 mmol/L (ref 22–32)
Calcium: 8.7 mg/dL — ABNORMAL LOW (ref 8.9–10.3)
Chloride: 105 mmol/L (ref 98–111)
Creatinine, Ser: 1.21 mg/dL (ref 0.61–1.24)
GFR calc Af Amer: >60 mL/min (ref >60)
GFR calc non Af Amer: 57 mL/min — ABNORMAL LOW (ref >60)
Glucose, Bld: 111 mg/dL — ABNORMAL HIGH (ref 70–99)
Potassium: 4 mmol/L (ref 3.5–5.1)
SODIUM: 135 mmol/L (ref 135–145)

## 2018-12-21 LAB — CBC
HCT: 43.5 % (ref 39.0–52.0)
Hemoglobin: 14.3 g/dL (ref 13.0–17.0)
MCH: 31.7 pg (ref 26.0–34.0)
MCHC: 32.9 g/dL (ref 30.0–36.0)
MCV: 96.5 fL (ref 80.0–100.0)
Platelets: 196 10*3/uL (ref 150–400)
RBC: 4.51 MIL/uL (ref 4.22–5.81)
RDW: 13.2 % (ref 11.5–15.5)
WBC: 9.2 10*3/uL (ref 4.0–10.5)
nRBC: 0 % (ref 0.0–0.2)

## 2018-12-21 LAB — LIPID PANEL
Cholesterol: 107 mg/dL (ref 0–200)
HDL: 49 mg/dL (ref >40)
LDL CALC: 48 mg/dL (ref 0–99)
Total CHOL/HDL Ratio: 2.2 RATIO
Triglycerides: 49 mg/dL (ref <150)
VLDL: 10 mg/dL (ref 0–40)

## 2018-12-21 LAB — SURGICAL PCR SCREEN
MRSA, PCR: NEGATIVE
Staphylococcus aureus: NEGATIVE

## 2018-12-21 LAB — GLUCOSE, CAPILLARY
Glucose-Capillary: 107 mg/dL — ABNORMAL HIGH (ref 70–99)
Glucose-Capillary: 101 mg/dL — ABNORMAL HIGH (ref 70–99)
Glucose-Capillary: 107 mg/dL — ABNORMAL HIGH (ref 70–99)
Glucose-Capillary: 132 mg/dL — ABNORMAL HIGH (ref 70–99)

## 2018-12-21 LAB — EPSTEIN-BARR VIRUS VCA, IGM: EBV VCA IgM: <36 U/mL (ref 0.0–35.9)

## 2018-12-21 LAB — CMV IGM: CMV IgM: <30 AU/mL (ref 0.0–29.9)

## 2018-12-21 LAB — EPSTEIN-BARR VIRUS VCA, IGG: EBV VCA IgG: 483 U/mL — ABNORMAL HIGH (ref 0.0–17.9)

## 2018-12-21 LAB — HIV ANTIBODY (ROUTINE TESTING W REFLEX): HIV Screen 4th Generation wRfx: NONREACTIVE

## 2018-12-21 SURGERY — CREATION, PERICARDIAL WINDOW, SUBXIPHOID APPROACH
Anesthesia: General | Site: Chest

## 2018-12-21 MED ORDER — MIDAZOLAM HCL 5 MG/5ML IJ SOLN
INTRAMUSCULAR | Status: DC | PRN
  Administered 2018-12-21 (×2): 1 mg via INTRAVENOUS

## 2018-12-21 MED ORDER — SUCCINYLCHOLINE CHLORIDE 20 MG/ML IJ SOLN
INTRAMUSCULAR | Status: DC | PRN
  Administered 2018-12-21: 120 mg via INTRAVENOUS

## 2018-12-21 MED ORDER — LIDOCAINE HCL (CARDIAC) PF 100 MG/5ML IV SOSY
PREFILLED_SYRINGE | INTRAVENOUS | Status: DC | PRN
  Administered 2018-12-21: 60 mg via INTRAVENOUS

## 2018-12-21 MED ORDER — VANCOMYCIN HCL 10 G IV SOLR: 1750.0000 mg | INTRAVENOUS | Status: DC

## 2018-12-21 MED ORDER — 0.9 % SODIUM CHLORIDE (POUR BTL) OPTIME
TOPICAL | Status: DC | PRN
  Administered 2018-12-21: 2000 mL

## 2018-12-21 MED ORDER — ONDANSETRON HCL 4 MG/2ML IJ SOLN
INTRAMUSCULAR | Status: DC | PRN
  Administered 2018-12-21: 4 mg via INTRAVENOUS

## 2018-12-21 MED ORDER — OXYCODONE HCL 5 MG PO TABS
5.0000 mg | ORAL_TABLET | ORAL | Status: DC | PRN
  Administered 2018-12-21 – 2018-12-22 (×3): 10 mg via ORAL
  Filled 2018-12-21 (×3): qty 2

## 2018-12-21 MED ORDER — SENNOSIDES-DOCUSATE SODIUM 8.6-50 MG PO TABS
1.0000 | ORAL_TABLET | Freq: Every day | ORAL | Status: DC
  Administered 2018-12-21 – 2018-12-23 (×3): 1 via ORAL
  Filled 2018-12-21 (×3): qty 1

## 2018-12-21 MED ORDER — SUCCINYLCHOLINE CHLORIDE 200 MG/10ML IV SOSY
PREFILLED_SYRINGE | INTRAVENOUS | Status: AC
  Filled 2018-12-21: qty 10

## 2018-12-21 MED ORDER — MIDAZOLAM HCL 2 MG/2ML IJ SOLN
INTRAMUSCULAR | Status: AC
  Filled 2018-12-21: qty 2

## 2018-12-21 MED ORDER — METOCLOPRAMIDE HCL 5 MG/ML IJ SOLN
10.0000 mg | Freq: Four times a day (QID) | INTRAMUSCULAR | Status: DC
  Administered 2018-12-21 – 2018-12-23 (×8): 10 mg via INTRAVENOUS
  Filled 2018-12-21 (×9): qty 2

## 2018-12-21 MED ORDER — ETOMIDATE 2 MG/ML IV SOLN
INTRAVENOUS | Status: DC | PRN
  Administered 2018-12-21: 12 mg via INTRAVENOUS

## 2018-12-21 MED ORDER — LIDOCAINE 2% (20 MG/ML) 5 ML SYRINGE
INTRAMUSCULAR | Status: AC
  Filled 2018-12-21: qty 5

## 2018-12-21 MED ORDER — MORPHINE SULFATE (PF) 2 MG/ML IV SOLN
2.0000 mg | INTRAVENOUS | Status: DC | PRN
  Administered 2018-12-21 – 2018-12-23 (×4): 2 mg via INTRAVENOUS
  Filled 2018-12-21 (×4): qty 1

## 2018-12-21 MED ORDER — TRAMADOL HCL 50 MG PO TABS
50.0000 mg | ORAL_TABLET | Freq: Four times a day (QID) | ORAL | Status: DC | PRN
  Administered 2018-12-21: 100 mg via ORAL
  Filled 2018-12-21: qty 2

## 2018-12-21 MED ORDER — ONDANSETRON HCL 4 MG/2ML IJ SOLN
INTRAMUSCULAR | Status: AC
  Filled 2018-12-21: qty 2

## 2018-12-21 MED ORDER — LACTATED RINGERS IV SOLN
INTRAVENOUS | Status: DC
  Administered 2018-12-21: 12:00:00 via INTRAVENOUS

## 2018-12-21 MED ORDER — SODIUM CHLORIDE 0.9 % IV SOLN
2.0000 g | Freq: Three times a day (TID) | INTRAVENOUS | Status: DC
  Administered 2018-12-21: 2 g via INTRAVENOUS
  Filled 2018-12-21 (×3): qty 2

## 2018-12-21 MED ORDER — POTASSIUM CHLORIDE 10 MEQ/50ML IV SOLN: 10.0000 meq | Freq: Every day | INTRAVENOUS | Status: DC | PRN

## 2018-12-21 MED ORDER — ONDANSETRON HCL 4 MG/2ML IJ SOLN
4.0000 mg | Freq: Four times a day (QID) | INTRAMUSCULAR | Status: DC | PRN
  Administered 2018-12-21: 4 mg via INTRAVENOUS
  Filled 2018-12-21: qty 2

## 2018-12-21 MED ORDER — ROCURONIUM BROMIDE 100 MG/10ML IV SOLN
INTRAVENOUS | Status: DC | PRN
  Administered 2018-12-21: 30 mg via INTRAVENOUS
  Administered 2018-12-21: 20 mg via INTRAVENOUS

## 2018-12-21 MED ORDER — PROPOFOL 10 MG/ML IV BOLUS
INTRAVENOUS | Status: AC
  Filled 2018-12-21: qty 20

## 2018-12-21 MED ORDER — PROMETHAZINE HCL 25 MG/ML IJ SOLN: 6.2500 mg | INTRAMUSCULAR | Status: DC | PRN

## 2018-12-21 MED ORDER — HEMOSTATIC AGENTS (NO CHARGE) OPTIME
TOPICAL | Status: DC | PRN
  Administered 2018-12-21: 1 via TOPICAL

## 2018-12-21 MED ORDER — SUGAMMADEX SODIUM 200 MG/2ML IV SOLN
INTRAVENOUS | Status: DC | PRN
  Administered 2018-12-21: 300 mg via INTRAVENOUS

## 2018-12-21 MED ORDER — FENTANYL CITRATE (PF) 250 MCG/5ML IJ SOLN
INTRAMUSCULAR | Status: AC
  Filled 2018-12-21: qty 5

## 2018-12-21 MED ORDER — BISACODYL 5 MG PO TBEC
10.0000 mg | DELAYED_RELEASE_TABLET | Freq: Every day | ORAL | Status: DC
  Administered 2018-12-21 – 2018-12-22 (×2): 10 mg via ORAL
  Filled 2018-12-21 (×2): qty 2

## 2018-12-21 MED ORDER — HYDROMORPHONE HCL 1 MG/ML IJ SOLN: 0.2500 mg | INTRAMUSCULAR | Status: DC | PRN

## 2018-12-21 MED ORDER — FENTANYL CITRATE (PF) 100 MCG/2ML IJ SOLN
INTRAMUSCULAR | Status: DC | PRN
  Administered 2018-12-21: 100 ug via INTRAVENOUS
  Administered 2018-12-21 (×2): 50 ug via INTRAVENOUS
  Administered 2018-12-21: 100 ug via INTRAVENOUS

## 2018-12-21 MED ORDER — PROPOFOL 10 MG/ML IV BOLUS
INTRAVENOUS | Status: DC | PRN
  Administered 2018-12-21: 50 mg via INTRAVENOUS

## 2018-12-21 MED ORDER — PHENYLEPHRINE 40 MCG/ML (10ML) SYRINGE FOR IV PUSH (FOR BLOOD PRESSURE SUPPORT)
PREFILLED_SYRINGE | INTRAVENOUS | Status: AC
  Filled 2018-12-21: qty 10

## 2018-12-21 MED ORDER — LACTATED RINGERS IV SOLN
INTRAVENOUS | Status: DC | PRN
  Administered 2018-12-21 (×2): via INTRAVENOUS

## 2018-12-21 MED ORDER — VANCOMYCIN HCL 10 G IV SOLR
2000.0000 mg | Freq: Once | INTRAVENOUS | Status: AC
  Administered 2018-12-21: 2000 mg via INTRAVENOUS
  Filled 2018-12-21: qty 2000

## 2018-12-21 MED ORDER — ACETAMINOPHEN 160 MG/5ML PO SOLN
1000.0000 mg | Freq: Four times a day (QID) | ORAL | Status: DC
  Filled 2018-12-21: qty 40.6

## 2018-12-21 MED ORDER — ETOMIDATE 2 MG/ML IV SOLN
INTRAVENOUS | Status: AC
  Filled 2018-12-21: qty 10

## 2018-12-21 MED ORDER — ROCURONIUM BROMIDE 50 MG/5ML IV SOSY
PREFILLED_SYRINGE | INTRAVENOUS | Status: AC
  Filled 2018-12-21: qty 5

## 2018-12-21 MED ORDER — FENTANYL CITRATE (PF) 100 MCG/2ML IJ SOLN
INTRAMUSCULAR | Status: AC
  Filled 2018-12-21: qty 2

## 2018-12-21 MED ORDER — ACETAMINOPHEN 500 MG PO TABS
1000.0000 mg | ORAL_TABLET | Freq: Four times a day (QID) | ORAL | Status: DC
  Administered 2018-12-21 – 2018-12-24 (×9): 1000 mg via ORAL
  Filled 2018-12-21 (×10): qty 2

## 2018-12-21 SURGICAL SUPPLY — 53 items
BENZOIN TINCTURE PRP APPL 2/3 (GAUZE/BANDAGES/DRESSINGS) ×4 IMPLANT
BLADE STERNUM SYSTEM 6 (BLADE) ×4 IMPLANT
CANISTER SUCT 3000ML PPV (MISCELLANEOUS) ×4 IMPLANT
CATH THORACIC 28FR (CATHETERS) IMPLANT
CATH THORACIC 28FR RT ANG (CATHETERS) ×4 IMPLANT
CATH THORACIC 36FR (CATHETERS) IMPLANT
CATH THORACIC 36FR RT ANG (CATHETERS) IMPLANT
CLIP VESOCCLUDE MED 24/CT (CLIP) ×4 IMPLANT
CLIP VESOCCLUDE SM WIDE 24/CT (CLIP) ×4 IMPLANT
CLOSURE WOUND 1/2 X4 (GAUZE/BANDAGES/DRESSINGS) ×1
CONT SPEC 4OZ CLIKSEAL STRL BL (MISCELLANEOUS) ×4 IMPLANT
COVER SURGICAL LIGHT HANDLE (MISCELLANEOUS) ×8 IMPLANT
COVER WAND RF STERILE (DRAPES) ×4 IMPLANT
DRAIN CHANNEL 28F RND 3/8 FF (WOUND CARE) ×4 IMPLANT
DRAPE HALF SHEET 40X57 (DRAPES) ×4 IMPLANT
DRAPE LAPAROSCOPIC ABDOMINAL (DRAPES) ×4 IMPLANT
ELECT BLADE 4.0 EZ CLEAN MEGAD (MISCELLANEOUS) ×4
ELECT BLADE 6.5 EXT (BLADE) ×4 IMPLANT
ELECT REM PT RETURN 9FT ADLT (ELECTROSURGICAL) ×4
ELECTRODE BLDE 4.0 EZ CLN MEGD (MISCELLANEOUS) ×2 IMPLANT
ELECTRODE REM PT RTRN 9FT ADLT (ELECTROSURGICAL) ×2 IMPLANT
GAUZE SPONGE 4X4 12PLY STRL (GAUZE/BANDAGES/DRESSINGS) ×4 IMPLANT
GLOVE BIO SURGEON STRL SZ7.5 (GLOVE) ×8 IMPLANT
GLOVE BIOGEL PI IND STRL 6 (GLOVE) ×2 IMPLANT
GLOVE BIOGEL PI INDICATOR 6 (GLOVE) ×2
HEMOSTAT POWDER SURGIFOAM 1G (HEMOSTASIS) IMPLANT
KIT BASIN OR (CUSTOM PROCEDURE TRAY) ×4 IMPLANT
KIT TURNOVER KIT B (KITS) ×4 IMPLANT
NS IRRIG 1000ML POUR BTL (IV SOLUTION) ×8 IMPLANT
PACK CHEST (CUSTOM PROCEDURE TRAY) ×4 IMPLANT
PAD ARMBOARD 7.5X6 YLW CONV (MISCELLANEOUS) ×8 IMPLANT
PAD ELECT DEFIB RADIOL ZOLL (MISCELLANEOUS) ×4 IMPLANT
STRIP CLOSURE SKIN 1/2X4 (GAUZE/BANDAGES/DRESSINGS) ×3 IMPLANT
SUT SILK  1 MH (SUTURE) ×2
SUT SILK 1 MH (SUTURE) ×2 IMPLANT
SUT SILK 2 0 SH CR/8 (SUTURE) ×4 IMPLANT
SUT VIC AB 1 CTX 18 (SUTURE) IMPLANT
SUT VIC AB 1 CTX 36 (SUTURE) ×6
SUT VIC AB 1 CTX36XBRD ANBCTR (SUTURE) ×6 IMPLANT
SUT VIC AB 2-0 CTX 27 (SUTURE) ×4 IMPLANT
SUT VIC AB 3-0 X1 27 (SUTURE) ×8 IMPLANT
SWAB COLLECTION DEVICE MRSA (MISCELLANEOUS) IMPLANT
SWAB CULTURE ESWAB REG 1ML (MISCELLANEOUS) IMPLANT
SYR 10ML LL (SYRINGE) IMPLANT
SYR 50ML SLIP (SYRINGE) IMPLANT
SYSTEM SAHARA CHEST DRAIN ATS (WOUND CARE) IMPLANT
TAPE CLOTH SURG 4X10 WHT LF (GAUZE/BANDAGES/DRESSINGS) ×4 IMPLANT
TOWEL GREEN STERILE (TOWEL DISPOSABLE) ×4 IMPLANT
TOWEL GREEN STERILE FF (TOWEL DISPOSABLE) ×4 IMPLANT
TRAP SPECIMEN MUCOUS 40CC (MISCELLANEOUS) IMPLANT
TRAY FOLEY MTR SLVR 14FR STAT (SET/KITS/TRAYS/PACK) ×4 IMPLANT
TRAY FOLEY SLVR 16FR TEMP STAT (SET/KITS/TRAYS/PACK) ×4 IMPLANT
WATER STERILE IRR 1000ML POUR (IV SOLUTION) ×8 IMPLANT

## 2018-12-21 NOTE — Progress Notes (Signed)
  Echocardiogram Echocardiogram Transesophageal has been performed.  Jannett Celestine 12/21/2018, 1:40 PM

## 2018-12-21 NOTE — Anesthesia Postprocedure Evaluation (Signed)
Anesthesia Post Note  Patient: Ian Estes  Procedure(s) Performed: SUBXYPHOID PERICARDIAL WINDOW (N/A Chest) TRANSESOPHAGEAL ECHOCARDIOGRAM (TEE) (N/A )     Patient location during evaluation: PACU Anesthesia Type: General Level of consciousness: awake and alert Pain management: pain level controlled Vital Signs Assessment: post-procedure vital signs reviewed and stable Respiratory status: spontaneous breathing, nonlabored ventilation, respiratory function stable and patient connected to nasal cannula oxygen Cardiovascular status: blood pressure returned to baseline and stable Postop Assessment: no apparent nausea or vomiting Anesthetic complications: no    Last Vitals:  Vitals:   12/21/18 1603 12/21/18 1618  BP: 106/78 135/79  Pulse: 93 93  Resp: 19 19  Temp:    SpO2: 94% 93%    Last Pain:  Vitals:   12/21/18 1618  TempSrc:   PainSc: 0-No pain                 [name] S

## 2018-12-21 NOTE — Anesthesia Procedure Notes (Signed)
Procedure Name: Intubation Date/Time: 12/21/2018 1:18 PM Performed by: Millenia Waldvogel T, CRNA Pre-anesthesia Checklist: Patient identified, Emergency Drugs available, Suction available and Patient being monitored Patient Re-evaluated:Patient Re-evaluated prior to induction Oxygen Delivery Method: Circle system utilized Preoxygenation: Pre-oxygenation with 100% oxygen Induction Type: IV induction Ventilation: Mask ventilation without difficulty and Oral airway inserted - appropriate to patient size Laryngoscope Size: Miller and 3 Grade View: Grade I Tube type: Oral Tube size: 7.5 mm Number of attempts: 1 Airway Equipment and Method: Patient positioned with wedge pillow and Stylet Placement Confirmation: ETT inserted through vocal cords under direct vision,  positive ETCO2 and breath sounds checked- equal and bilateral Secured at: 22 cm Tube secured with: Tape Dental Injury: Teeth and Oropharynx as per pre-operative assessment

## 2018-12-21 NOTE — Anesthesia Procedure Notes (Addendum)
Anesthesia Procedure Image    

## 2018-12-21 NOTE — Anesthesia Procedure Notes (Signed)
Arterial Line Insertion Start/End3/01/2019 12:20 PM, 12/21/2018 12:39 PM Performed by: Josephine Igo, CRNA, CRNA  Patient location: Pre-op. Preanesthetic checklist: patient identified, IV checked, risks and benefits discussed, surgical consent, monitors and equipment checked and anesthesia consent Lidocaine 1% used for infiltration Left, radial was placed Catheter size: 20 G Hand hygiene performed , maximum sterile barriers used  and Seldinger technique used  Attempts: 1 Procedure performed without using ultrasound guided technique. Following insertion, dressing applied and Biopatch. Post procedure assessment: normal  Patient tolerated the procedure well with no immediate complications.

## 2018-12-21 NOTE — Brief Op Note (Addendum)
12/20/2018 - 12/21/2018  2:34 PM  PATIENT:  Ian Estes  78 y.o. male  PRE-OPERATIVE DIAGNOSIS:  PERICARDIAL EFFUSION  POST-OPERATIVE DIAGNOSIS:  PERICARDIAL EFFUSION  PROCEDURE:  Procedure(s): SUBXYPHOID PERICARDIAL WINDOW, PERICARDIAL BX.  TRANSESOPHAGEAL ECHOCARDIOGRAM   A fairly small nonhemorrhagic pericardial effusion was drained-250 cc. Pericardial fluid was somewhat cloudy and submitted for cultures and cytology A segment of the pericardial tissue removed was minimally inflamed and sent for pathology and culture   The patient should be able to start on oral anticoagulation the day after  the pericardial drain is removed  SURGEON:   Prescott Gum, Collier Salina, MD - Primary  PHYSICIAN ASSISTANT: [place]  ANESTHESIA:   general  EBL:  50 mL   BLOOD ADMINISTERED:none  DRAINS: 63fr right angle mediastinal tube   LOCAL MEDICATIONS USED:  NONE  SPECIMEN: Samples of pericardium and pericardial fluid sent for both culture and for pathology.  DISPOSITION OF SPECIMEN:  Pathology and microbiology  COUNTS:  YES  DICTATION: .Dragon Dictation  PLAN OF CARE: Admit to inpatient   PATIENT DISPOSITION:  PACU - hemodynamically stable.   Delay start of Pharmacological VTE agent (>24hrs) due to surgical blood loss or risk of bleeding: yes

## 2018-12-21 NOTE — Progress Notes (Signed)
Patient is going from DNR to Full Code with MD Hochrein. Removed DNR band.  Saddie Benders RN

## 2018-12-21 NOTE — Progress Notes (Signed)
      [address]       [place] [address]             [phone]      S/p pericardial window  Resting comfortably  BP 135/79   Pulse 81   Temp (!) 97.5 F (36.4 C)   Resp 18   Ht 6' 0.01" (1.829 m)   Wt 103.9 kg   SpO2 90%   BMI 31.05 kg/m   Intake/Output Summary (Last 24 hours) at 12/21/2018 1753 Last data filed at 12/21/2018 1700 Gross per 24 hour  Intake 940 ml  Output 1370 ml  Net -430 ml   Remo Lipps C. Roxan Hockey, MD [facility] [phone]

## 2018-12-21 NOTE — Progress Notes (Signed)
Pharmacy Antibiotic Note  Ian Estes is a 78 y.o. male admitted on 12/20/2018 with pericardial effusion -broaden coverage until cultures result.  Pharmacy has been consulted for vancomycin dosing.  Underwent subxyphoid pericardial window on 3/4, with samples sent for culture and pathology. WBC 9.2, afebrile, MRSA PCR neg. Sed rate 22, CRP 11. Scr 1.21 (CrCl 62.7 mL/min). Plan to use broad antibiotics until cultures result.  Plan: Vancomycin 2 g IV once then 1750 mg IV every 24 hours Monitor renal fx, cx results, clinical pic, and levels as appropriate  Height: 6' 0.01" (182.9 cm) Weight: 229 lb (103.9 kg) IBW/kg (Calculated) : 77.62  Temp (24hrs), Avg:97.9 F (36.6 C), Min:97.5 F (36.4 C), Max:98.2 F (36.8 C)  Recent Labs  Lab 12/20/18 0444 12/21/18 0348  WBC 12.2* 9.2  CREATININE 1.16 1.21    Estimated Creatinine Clearance: 62.7 mL/min (by C-G formula based on SCr of 1.21 mg/dL).    No Known Allergies  Antimicrobials this admission: Cefepime 3/4 >>  Vancomycin 3/4 >>   Dose adjustments this admission: N/A  Microbiology results: 3/4 Pericardial Fluid Cx: no orgs 3/4 MRSA PCR: neg  Thank you for allowing pharmacy to be a part of this patient's care.  Antonietta Jewel, PharmD, [place] Clinical Pharmacist  Pager: [phone] Phone: [phone] 12/21/2018 4:44 PM

## 2018-12-21 NOTE — Progress Notes (Addendum)
PROGRESS NOTE    Ian Estes  VOJ:[id number] DOB: 1941/03/08 DOA: 12/20/2018 PCP: Aletha Halim., PA-C  Outpatient Specialists:   Brief Narrative: Ian Estes is a 78 y.o. male with medical history significant of DM; prostate CA; HTN; and HLD presenting with chest pain.  Work-up done revealed significant pericardial effusion.  Patient underwent pericardial window today.  Assessment & Plan:   Principal Problem:   Chest pain Active Problems:   Diabetes mellitus without complication (HCC)   Hypertension   Hyperlipidemia   Unspecified atrial fibrillation (HCC)   Pericardial effusion  Chest pain/pericardial effusion: -Work-up revealed pericardial effusion. -CTA done that showed a moderate pericardial effusion as well as aortic and coronary atherosclerosis. -Negative troponin.   -EKG not indicative of acute ischemia.  -Cardiology and cardiothoracic team is appreciated. -Pericardial window created. -Echo report is noted, possible thrombosis reported.  New-onset afib -Patient presenting with new-onset afib with rate control -Heart rate is well controlled despite no medication -CHA2DS2-VASc Score is >2 -No anticoagulation for now due to a pericardial effusion.  -Cardiology input is appreciated.    HTN -Continue to optimize.  HLD -Continue Lipitor -Lipids were checked in 8/15 (TC 165, HDL 52, LDL 83, TG 148); will recheck  DM -A1c is 5.6, off medications -Continue to monitor.    DVT prophylaxis: SCDs Code Status:  DNR - confirmed with patient Family Communication: Sister was present throughout evaluation Disposition Plan:  Home once clinically improved Consults called: Cardiology; CT surgery  Procedures:   Pericardial window  Antimicrobials:   IV cefepime   Subjective: No chest pain No fever or chills  Objective: Vitals:   12/21/18 0415 12/21/18 0416 12/21/18 0928 12/21/18 1215  BP: 118/77  120/67   Pulse:   67   Resp:      Temp:  98.1 F  (36.7 C)    TempSrc:      SpO2:      Weight:    103.9 kg  Height:    6' 0.01" (1.829 m)    Intake/Output Summary (Last 24 hours) at 12/21/2018 1328 Last data filed at 12/21/2018 0900 Gross per 24 hour  Intake 240 ml  Output 950 ml  Net -710 ml   Filed Weights   12/20/18 1608 12/21/18 1215  Weight: 103.9 kg 103.9 kg    Examination:  General exam: Appears calm and comfortable  Respiratory system: Clear to auscultation.  Cardiovascular system: S1 & S2  Gastrointestinal system: Abdomen is obese, soft and nontender.  Organs are difficult to assess Central nervous system: Alert and oriented.  Patient moves all limbs. Extremities: No leg edema.  Data Reviewed: I have personally reviewed following labs and imaging studies  CBC: Recent Labs  Lab 12/20/18 0444 12/21/18 0348  WBC 12.2* 9.2  HGB 15.9 14.3  HCT 47.9 43.5  MCV 96.4 96.5  PLT 231 993   Basic Metabolic Panel: Recent Labs  Lab 12/20/18 0444 12/21/18 0348  NA 134* 135  K 4.1 4.0  CL 105 105  CO2 17* 23  GLUCOSE 164* 111*  BUN 16 14  CREATININE 1.16 1.21  CALCIUM 8.9 8.7*   GFR: Estimated Creatinine Clearance: 62.7 mL/min (by C-G formula based on SCr of 1.21 mg/dL). Liver Function Tests: Recent Labs  Lab 12/20/18 0444  AST 34  ALT 25  ALKPHOS 144*  BILITOT 1.0  PROT 7.1  ALBUMIN 3.5   Recent Labs  Lab 12/20/18 0444  LIPASE 21   No results for input(s): AMMONIA in the last 168 hours.  Coagulation Profile: Recent Labs  Lab 12/20/18 1643  INR 1.1   Cardiac Enzymes: Recent Labs  Lab 12/20/18 0901 12/20/18 1643 12/20/18 2043  TROPONINI <0.03 <0.03 <0.03   BNP (last 3 results) No results for input(s): PROBNP in the last 8760 hours. HbA1C: Recent Labs    12/20/18 0901  HGBA1C 5.6   CBG: Recent Labs  Lab 12/20/18 1131 12/20/18 1721 12/20/18 2102 12/21/18 0429 12/21/18 0831  GLUCAP 123* 148* 166* 107* 101*   Lipid Profile: Recent Labs    12/21/18 0348  CHOL 107  HDL 49    LDLCALC 48  TRIG 49  CHOLHDL 2.2   Thyroid Function Tests: Recent Labs    12/20/18 0901  TSH 1.785   Anemia Panel: No results for input(s): VITAMINB12, FOLATE, FERRITIN, TIBC, IRON, RETICCTPCT in the last 72 hours. Urine analysis:    Component Value Date/Time   COLORURINE YELLOW 12/21/2018 0719   APPEARANCEUR CLEAR 12/21/2018 0719   LABSPEC 1.036 (H) 12/21/2018 0719   PHURINE 5.0 12/21/2018 0719   GLUCOSEU NEGATIVE 12/21/2018 0719   HGBUR NEGATIVE 12/21/2018 0719   BILIRUBINUR NEGATIVE 12/21/2018 0719   KETONESUR NEGATIVE 12/21/2018 0719   PROTEINUR NEGATIVE 12/21/2018 0719   UROBILINOGEN 0.2 06/13/2014 1742   NITRITE NEGATIVE 12/21/2018 0719   LEUKOCYTESUR NEGATIVE 12/21/2018 0719   Sepsis Labs: @LABRCNTIP (procalcitonin:4,lacticidven:4)  ) Recent Results (from the past 240 hour(s))  Surgical pcr screen     Status: None   Collection Time: 12/21/18  7:44 AM  Result Value Ref Range Status   MRSA, PCR NEGATIVE NEGATIVE Final   Staphylococcus aureus NEGATIVE NEGATIVE Final    Comment: (NOTE) The Xpert SA Assay (FDA approved for NASAL specimens in patients 49 years of age and older), is one component of a comprehensive surveillance program. It is not intended to diagnose infection nor to guide or monitor treatment. Performed at [facility], [place] [address] [place], [place] [address]          Radiology Studies: Dg Chest 2 View  Result Date: 12/20/2018 CLINICAL DATA:  Initial evaluation for acute chest pain. EXAM: CHEST - 2 VIEW COMPARISON:  None available. FINDINGS: Moderate cardiomegaly.  Mediastinal silhouette normal. Elevation of the left hemidiaphragm. Associated streaky left basilar opacity favored to reflect atelectasis, although infiltrate could be considered in the correct clinical setting. Trace left pleural effusion. No overt pulmonary edema. No pneumothorax. No acute osseous finding. IMPRESSION: 1. Elevation of left hemidiaphragm with associated  streaky left basilar opacity. Atelectasis is favored, although focal infiltrate could be considered in the correct clinical setting. 2. Trace layering left pleural effusion. 3. Cardiomegaly without pulmonary edema. Electronically Signed   By: Jeannine Boga M.D.   On: 12/20/2018 05:21   Ct Angio Chest Pe W/cm &/or Wo Cm  Result Date: 12/20/2018 CLINICAL DATA:  Shortness of breath with high pretest probability of pulmonary embolism. EXAM: CT ANGIOGRAPHY CHEST WITH CONTRAST TECHNIQUE: Multidetector CT imaging of the chest was performed using the standard protocol during bolus administration of intravenous contrast. Multiplanar CT image reconstructions and MIPs were obtained to evaluate the vascular anatomy. CONTRAST:  42mL OMNIPAQUE IOHEXOL 350 MG/ML SOLN COMPARISON:  None. FINDINGS: Cardiovascular: No cardiomegaly. Moderate pericardial effusion that is low-density. Interpretation of serosal enhancement is not possible due to contrast timing. Negative for pulmonary embolism. Limited opacification of the left heart and systemic arterial tree. Aortic and coronary atherosclerotic calcification. Mediastinum/Nodes: Prominent hilar nodal size, likely reactive. Lungs/Pleura: Bronchomalacia with intermittent airway narrowing. Mild atelectasis greatest in the  left lower lobe. There is no edema, consolidation, effusion, or pneumothorax. Upper Abdomen: Negative Musculoskeletal: No acute or aggressive finding.  Spondylosis. The patient experienced a contrast extravasation that was likely only of the saline flush given the technologist report and quality of contrast bolus. These results were called by telephone at the time of interpretation on 12/20/2018 at 7:33 am to Dr. Ashok Cordia. Review of the MIP images confirms the above findings. IMPRESSION: 1. Moderate pericardial effusion. 2. Negative for pulmonary embolism. 3. Aortic and coronary atherosclerosis. 4. IV extravasation which was likely small volume and primarily saline.  Electronically Signed   By: Monte Fantasia M.D.   On: 12/20/2018 07:36   Ct Angio Chest/abd/pel For Dissection W And/or W/wo  Result Date: 12/20/2018 CLINICAL DATA:  78 year old male with history of chest pain. EXAM: CT ANGIOGRAPHY CHEST, ABDOMEN AND PELVIS TECHNIQUE: Multidetector CT imaging through the chest, abdomen and pelvis was performed using the standard protocol during bolus administration of intravenous contrast. Multiplanar reconstructed images and MIPs were obtained and reviewed to evaluate the vascular anatomy. CONTRAST:  100 mL of Isovue 370. COMPARISON:  Chest CTA 01/16/2019. FINDINGS: CTA CHEST FINDINGS Cardiovascular: On precontrast images there is no crescentic high attenuation associated with the wall of the thoracic aorta to suggest an acute intramural hemorrhage. Post-contrast images demonstrate no evidence of thoracic aortic aneurysm or dissection. There is aortic atherosclerosis, as well as atherosclerosis of the great vessels of the mediastinum and the coronary arteries, including calcified atherosclerotic plaque in the left main, left anterior descending, left circumflex and right coronary arteries. Heart size is normal. Moderate volume of intermediate to high attenuation pericardial fluid, concerning for an acute pericarditis with proteinaceous exudate of fluid. No pericardial calcification. Mediastinum/Nodes: No pathologically enlarged mediastinal or hilar lymph nodes. Esophagus is unremarkable in appearance. No axillary lymphadenopathy. Lungs/Pleura: No suspicious appearing pulmonary nodules or masses are noted. No acute consolidative airspace disease. No pleural effusions. Musculoskeletal: There are no aggressive appearing lytic or blastic lesions noted in the visualized portions of the skeleton. Review of the MIP images confirms the above findings. CTA ABDOMEN AND PELVIS FINDINGS VASCULAR Aorta: Aortic atherosclerosis. Normal caliber aorta without aneurysm, dissection, vasculitis or  significant stenosis. Celiac: Patent without evidence of aneurysm, dissection or vasculitis. Mild stenosis at the ostium. SMA: Patent without evidence of aneurysm, dissection, vasculitis or significant stenosis. Renals: Both renal arteries are patent without evidence of aneurysm, dissection, vasculitis, fibromuscular dysplasia or significant stenosis. Large accessory artery to the lower pole the left kidney IMA: Patent without evidence of aneurysm, dissection, vasculitis or significant stenosis. Inflow: Patent without evidence of aneurysm, dissection, vasculitis or significant stenosis. Veins: No obvious venous abnormality within the limitations of this arterial phase study. Review of the MIP images confirms the above findings. NON-VASCULAR Hepatobiliary: No suspicious cystic or solid hepatic lesions. No intra or extrahepatic biliary ductal dilatation. Gallbladder is moderately distended, but there are no definite gallstones or surrounding inflammatory changes to suggest an acute cholecystitis at this time. Pancreas: No pancreatic mass. No pancreatic ductal dilatation. No pancreatic or peripancreatic fluid or inflammatory changes. Spleen: Unremarkable. Adrenals/Urinary Tract: 1.6 cm low-attenuation lesion in the interpolar region of the left kidney, compatible with a simple cyst. Multifocal cortical thinning in both kidneys. Mild adrenal form thickening of both adrenal glands without discrete mass. No hydroureteronephrosis. Urinary bladder is unremarkable in appearance. Stomach/Bowel: Normal appearance of the stomach. No pathologic dilatation of small bowel or colon. A few scattered colonic diverticulae are noted, without surrounding inflammatory changes to suggest an  acute diverticulitis at this time. Normal appendix. Lymphatic: No pathologically enlarged lymph nodes are noted in the abdomen or pelvis. Reproductive: Status post prostatectomy. Other: No significant volume of ascites.  No pneumoperitoneum.  Musculoskeletal: There are no aggressive appearing lytic or blastic lesions noted in the visualized portions of the skeleton. Review of the MIP images confirms the above findings. IMPRESSION: 1. Moderate volume of high attenuation pericardial fluid, likely reflective of an acute pericarditis. This likely accounts for the patient's history of chest pain. 2. No acute abnormality of the thoracoabdominal aorta. Specifically, no findings to suggest an acute aortic syndrome. 3. Aortic atherosclerosis, in addition to left main and 3 vessel coronary artery disease. Assessment for potential risk factor modification, dietary therapy or pharmacologic therapy may be warranted, if clinically indicated. 4. Colonic diverticulosis without evidence of acute diverticulitis at this time. 5. Additional incidental findings, as above. Electronically Signed   By: Vinnie Langton M.D.   On: 12/20/2018 14:49        Scheduled Meds: . [MAR Hold] allopurinol  100 mg Oral Daily  . [MAR Hold] amLODipine  5 mg Oral Daily  . [MAR Hold] atorvastatin  20 mg Oral Daily  . [MAR Hold] folic acid  1 mg Oral Daily  . [MAR Hold] insulin aspart  0-15 Units Subcutaneous TID WC  . [MAR Hold] insulin aspart  0-5 Units Subcutaneous QHS  . [MAR Hold] metoprolol tartrate  12.5 mg Oral BID  . midazolam      . [MAR Hold] sodium chloride flush  3 mL Intravenous Q12H  . [MAR Hold] thiamine  100 mg Oral Daily   Continuous Infusions: . [MAR Hold] sodium chloride    . cefUROXime (ZINACEF)  IV    . lactated ringers 10 mL/hr at 12/21/18 1222     LOS: 1 day    Time spent: 25 minutes   Dana Allan, MD  [facility] Pager #: [phone] 7PM-7AM contact night coverage as above

## 2018-12-21 NOTE — Anesthesia Preprocedure Evaluation (Signed)
Anesthesia Evaluation  Patient identified by MRN, date of birth, ID band Patient awake    Reviewed: Allergy & Precautions, NPO status , Patient's Chart, lab work & pertinent test results  Airway Mallampati: II  TM Distance: >3 FB Neck ROM: Full    Dental no notable dental hx.    Pulmonary neg pulmonary ROS, former smoker,    Pulmonary exam normal breath sounds clear to auscultation       Cardiovascular hypertension,  Rhythm:Irregular Rate:Normal + Friction Rub . The mitral valve is normal in structure.  7. The tricuspid valve is normal in structure.  8. The aortic valve was not well visualized.  9. The pulmonic valve was normal in structure. 10. The inferior vena cava was dilated in size with <50% respiratory variability.  FINDINGS  Left Ventricle: The left ventricle has normal systolic function, with an ejection fraction of 60-65%. The cavity size was normal. There is no increase in left ventricular wall thickness. Left ventricular diastolic Doppler parameters are indeterminate  secondary to atrial fibrillation. Right Ventricle: The right ventricle has normal systolic function. The cavity was normal. There is no increase in right ventricular wall thickness. Left Atrium: left atrial size was normal in size Right Atrium: right atrial size was normal in size. Right atrial pressure is estimated at 15 mmHg. Interatrial Septum: No atrial level shunt detected by color flow Doppler. Pericardium: There is no evidence of pericardial effusion. There is excessive respiratory variation in the tricuspid valve spectral Doppler velocities. Large pericardial effusion measuring up to 2.5 cm anteriorly. There appears to be a large thrombus  anterior to RV. Cannot rule out a prominent fat pad. However, there is free-flowing fluid both anterior and posterior, making thrombus more likely. There is no evidence of RA or RV diastolic collapse, but the IVC is  dilated and does not collapse. There  is significant tricuspid valve inflow variation. Mitral inflow pattern is unremarkable. Findings concerning for early tamponade. There is a large pleural effusion in either the left or right lateral region. Mitral Valve: The mitral valve is normal in structure. Mitral valve regurgitation is trivial by color flow Doppler. Tricuspid Valve: The tricuspid valve is normal in structure. Tricuspid valve regurgitation is mild by color flow Doppler. Aortic Valve: The aortic valve was not well visualized Aortic valve regurgitation was not visualized by color flow Doppler.   Neuro/Psych negative neurological ROS  negative psych ROS   GI/Hepatic negative GI ROS, Neg liver ROS,   Endo/Other  diabetes  Renal/GU Renal InsufficiencyRenal disease  negative genitourinary   Musculoskeletal negative musculoskeletal ROS (+)   Abdominal   Peds negative pediatric ROS (+)  Hematology negative hematology ROS (+)   Anesthesia Other Findings   Reproductive/Obstetrics negative OB ROS                             Anesthesia Physical Anesthesia Plan  ASA: III  Anesthesia Plan: General   Post-op Pain Management:    Induction: Intravenous  PONV Risk Score and Plan: 2 and Ondansetron, Dexamethasone and Treatment may vary due to age or medical condition  Airway Management Planned: Oral ETT  Additional Equipment: Arterial line  Intra-op Plan:   Post-operative Plan: Extubation in OR  Informed Consent: I have reviewed the patients History and Physical, chart, labs and discussed the procedure including the risks, benefits and alternatives for the proposed anesthesia with the patient or authorized representative who has indicated his/her understanding and acceptance.  Dental advisory given  Plan Discussed with: CRNA and Surgeon  Anesthesia Plan Comments:         Anesthesia Quick Evaluation

## 2018-12-21 NOTE — Progress Notes (Signed)
Pre Procedure note for inpatients:   Ian Estes has been scheduled for Procedure(s): SUBXYPHOID PERICARDIAL WINDOW (N/A) TRANSESOPHAGEAL ECHOCARDIOGRAM (TEE) (N/A) today. The various methods of treatment have been discussed with the patient. After consideration of the risks, benefits and treatment options the patient has consented to the planned procedure.   The patient has been seen and labs reviewed. There are no changes in the patient's condition to prevent proceeding with the planned procedure today.  Recent labs:  Lab Results  Component Value Date   WBC 9.2 12/21/2018   HGB 14.3 12/21/2018   HCT 43.5 12/21/2018   PLT 196 12/21/2018   GLUCOSE 111 (H) 12/21/2018   CHOL 107 12/21/2018   TRIG 49 12/21/2018   HDL 49 12/21/2018   LDLCALC 48 12/21/2018   ALT 25 12/20/2018   AST 34 12/20/2018   NA 135 12/21/2018   K 4.0 12/21/2018   CL 105 12/21/2018   CREATININE 1.21 12/21/2018   BUN 14 12/21/2018   CO2 23 12/21/2018   TSH 1.785 12/20/2018   INR 1.1 12/20/2018   HGBA1C 5.6 12/20/2018    [name], MD 12/21/2018 12:14 PM

## 2018-12-21 NOTE — Progress Notes (Addendum)
Progress Note  Patient Name: Ian Estes Date of Encounter: 12/21/2018  Primary Cardiologist: Minus Breeding, MD  Subjective   Feeling well this morning, no recurrent CP.  Inpatient Medications    Scheduled Meds: . allopurinol  100 mg Oral Daily  . amLODipine  5 mg Oral Daily  . atorvastatin  20 mg Oral Daily  . folic acid  1 mg Oral Daily  . insulin aspart  0-15 Units Subcutaneous TID WC  . insulin aspart  0-5 Units Subcutaneous QHS  . metoprolol tartrate  12.5 mg Oral BID  . sodium chloride flush  3 mL Intravenous Q12H  . thiamine  100 mg Oral Daily   Continuous Infusions: . sodium chloride    . cefUROXime (ZINACEF)  IV     PRN Meds: sodium chloride, acetaminophen, clonazePAM, morphine injection, nitroGLYCERIN, ondansetron (ZOFRAN) IV, sodium chloride flush   Vital Signs    Vitals:   12/20/18 2053 12/20/18 2155 12/21/18 0415 12/21/18 0416  BP: 113/77 118/63 118/77   Pulse: 76     Resp: 19     Temp: 98.2 F (36.8 C)   98.1 F (36.7 C)  TempSrc: Oral     SpO2: 95%     Weight:      Height:        Intake/Output Summary (Last 24 hours) at 12/21/2018 0851 Last data filed at 12/21/2018 0700 Gross per 24 hour  Intake 240 ml  Output 950 ml  Net -710 ml   Last 3 Weights 12/20/2018 07/27/2018 06/30/2017  Weight (lbs) 229 lb 237 lb 250 lb  Weight (kg) 103.874 kg 107.502 kg 113.399 kg     Telemetry    Atrial fib, HR controlled (one 2.5 sec pause overnight but otherwise rate normal) - Personally Reviewed  Physical Exam   GEN: No acute distress.  HEENT: Normocephalic, atraumatic, sclera non-icteric. Neck: No JVD or bruits. Cardiac: Irregularly irregular, no murmurs, rubs, or gallops.  Radials/DP/PT 1+ and equal bilaterally.  Respiratory: Clear to auscultation bilaterally. Breathing is unlabored. GI: Soft, nontender, non-distended, BS +x 4. MS: no deformity. Extremities: No clubbing or cyanosis. No edema. Distal pedal pulses are 2+ and equal  bilaterally. Neuro:  AAOx3. Follows commands. Psych:  Responds to questions appropriately with a normal affect.  Labs    Chemistry Recent Labs  Lab 12/20/18 0444 12/21/18 0348  NA 134* 135  K 4.1 4.0  CL 105 105  CO2 17* 23  GLUCOSE 164* 111*  BUN 16 14  CREATININE 1.16 1.21  CALCIUM 8.9 8.7*  PROT 7.1  --   ALBUMIN 3.5  --   AST 34  --   ALT 25  --   ALKPHOS 144*  --   BILITOT 1.0  --   GFRNONAA 60* 57*  GFRAA >60 >60  ANIONGAP 12 7     Hematology Recent Labs  Lab 12/20/18 0444 12/21/18 0348  WBC 12.2* 9.2  RBC 4.97 4.51  HGB 15.9 14.3  HCT 47.9 43.5  MCV 96.4 96.5  MCH 32.0 31.7  MCHC 33.2 32.9  RDW 13.0 13.2  PLT 231 196    Cardiac Enzymes Recent Labs  Lab 12/20/18 0901 12/20/18 1643 12/20/18 2043  TROPONINI <0.03 <0.03 <0.03    Recent Labs  Lab 12/20/18 0450  TROPIPOC 0.00     BNP Recent Labs  Lab 12/20/18 0515  BNP 284.6*     DDimer No results for input(s): DDIMER in the last 168 hours.   Radiology    Dg Chest  2 View  Result Date: 12/20/2018 CLINICAL DATA:  Initial evaluation for acute chest pain. EXAM: CHEST - 2 VIEW COMPARISON:  None available. FINDINGS: Moderate cardiomegaly.  Mediastinal silhouette normal. Elevation of the left hemidiaphragm. Associated streaky left basilar opacity favored to reflect atelectasis, although infiltrate could be considered in the correct clinical setting. Trace left pleural effusion. No overt pulmonary edema. No pneumothorax. No acute osseous finding. IMPRESSION: 1. Elevation of left hemidiaphragm with associated streaky left basilar opacity. Atelectasis is favored, although focal infiltrate could be considered in the correct clinical setting. 2. Trace layering left pleural effusion. 3. Cardiomegaly without pulmonary edema. Electronically Signed   By: Jeannine Boga M.D.   On: 12/20/2018 05:21   Ct Angio Chest Pe W/cm &/or Wo Cm  Result Date: 12/20/2018 CLINICAL DATA:  Shortness of breath with high  pretest probability of pulmonary embolism. EXAM: CT ANGIOGRAPHY CHEST WITH CONTRAST TECHNIQUE: Multidetector CT imaging of the chest was performed using the standard protocol during bolus administration of intravenous contrast. Multiplanar CT image reconstructions and MIPs were obtained to evaluate the vascular anatomy. CONTRAST:  58m OMNIPAQUE IOHEXOL 350 MG/ML SOLN COMPARISON:  None. FINDINGS: Cardiovascular: No cardiomegaly. Moderate pericardial effusion that is low-density. Interpretation of serosal enhancement is not possible due to contrast timing. Negative for pulmonary embolism. Limited opacification of the left heart and systemic arterial tree. Aortic and coronary atherosclerotic calcification. Mediastinum/Nodes: Prominent hilar nodal size, likely reactive. Lungs/Pleura: Bronchomalacia with intermittent airway narrowing. Mild atelectasis greatest in the left lower lobe. There is no edema, consolidation, effusion, or pneumothorax. Upper Abdomen: Negative Musculoskeletal: No acute or aggressive finding.  Spondylosis. The patient experienced a contrast extravasation that was likely only of the saline flush given the technologist report and quality of contrast bolus. These results were called by telephone at the time of interpretation on 12/20/2018 at 7:33 am to Dr. SAshok Cordia Review of the MIP images confirms the above findings. IMPRESSION: 1. Moderate pericardial effusion. 2. Negative for pulmonary embolism. 3. Aortic and coronary atherosclerosis. 4. IV extravasation which was likely small volume and primarily saline. Electronically Signed   By: JMonte FantasiaM.D.   On: 12/20/2018 07:36   Ct Angio Chest/abd/pel For Dissection W And/or W/wo  Result Date: 12/20/2018 CLINICAL DATA:  78year old male with history of chest pain. EXAM: CT ANGIOGRAPHY CHEST, ABDOMEN AND PELVIS TECHNIQUE: Multidetector CT imaging through the chest, abdomen and pelvis was performed using the standard protocol during bolus  administration of intravenous contrast. Multiplanar reconstructed images and MIPs were obtained and reviewed to evaluate the vascular anatomy. CONTRAST:  100 mL of Isovue 370. COMPARISON:  Chest CTA 01/16/2019. FINDINGS: CTA CHEST FINDINGS Cardiovascular: On precontrast images there is no crescentic high attenuation associated with the wall of the thoracic aorta to suggest an acute intramural hemorrhage. Post-contrast images demonstrate no evidence of thoracic aortic aneurysm or dissection. There is aortic atherosclerosis, as well as atherosclerosis of the great vessels of the mediastinum and the coronary arteries, including calcified atherosclerotic plaque in the left main, left anterior descending, left circumflex and right coronary arteries. Heart size is normal. Moderate volume of intermediate to high attenuation pericardial fluid, concerning for an acute pericarditis with proteinaceous exudate of fluid. No pericardial calcification. Mediastinum/Nodes: No pathologically enlarged mediastinal or hilar lymph nodes. Esophagus is unremarkable in appearance. No axillary lymphadenopathy. Lungs/Pleura: No suspicious appearing pulmonary nodules or masses are noted. No acute consolidative airspace disease. No pleural effusions. Musculoskeletal: There are no aggressive appearing lytic or blastic lesions noted in the visualized portions of  the skeleton. Review of the MIP images confirms the above findings. CTA ABDOMEN AND PELVIS FINDINGS VASCULAR Aorta: Aortic atherosclerosis. Normal caliber aorta without aneurysm, dissection, vasculitis or significant stenosis. Celiac: Patent without evidence of aneurysm, dissection or vasculitis. Mild stenosis at the ostium. SMA: Patent without evidence of aneurysm, dissection, vasculitis or significant stenosis. Renals: Both renal arteries are patent without evidence of aneurysm, dissection, vasculitis, fibromuscular dysplasia or significant stenosis. Large accessory artery to the lower  pole the left kidney IMA: Patent without evidence of aneurysm, dissection, vasculitis or significant stenosis. Inflow: Patent without evidence of aneurysm, dissection, vasculitis or significant stenosis. Veins: No obvious venous abnormality within the limitations of this arterial phase study. Review of the MIP images confirms the above findings. NON-VASCULAR Hepatobiliary: No suspicious cystic or solid hepatic lesions. No intra or extrahepatic biliary ductal dilatation. Gallbladder is moderately distended, but there are no definite gallstones or surrounding inflammatory changes to suggest an acute cholecystitis at this time. Pancreas: No pancreatic mass. No pancreatic ductal dilatation. No pancreatic or peripancreatic fluid or inflammatory changes. Spleen: Unremarkable. Adrenals/Urinary Tract: 1.6 cm low-attenuation lesion in the interpolar region of the left kidney, compatible with a simple cyst. Multifocal cortical thinning in both kidneys. Mild adrenal form thickening of both adrenal glands without discrete mass. No hydroureteronephrosis. Urinary bladder is unremarkable in appearance. Stomach/Bowel: Normal appearance of the stomach. No pathologic dilatation of small bowel or colon. A few scattered colonic diverticulae are noted, without surrounding inflammatory changes to suggest an acute diverticulitis at this time. Normal appendix. Lymphatic: No pathologically enlarged lymph nodes are noted in the abdomen or pelvis. Reproductive: Status post prostatectomy. Other: No significant volume of ascites.  No pneumoperitoneum. Musculoskeletal: There are no aggressive appearing lytic or blastic lesions noted in the visualized portions of the skeleton. Review of the MIP images confirms the above findings. IMPRESSION: 1. Moderate volume of high attenuation pericardial fluid, likely reflective of an acute pericarditis. This likely accounts for the patient's history of chest pain. 2. No acute abnormality of the  thoracoabdominal aorta. Specifically, no findings to suggest an acute aortic syndrome. 3. Aortic atherosclerosis, in addition to left main and 3 vessel coronary artery disease. Assessment for potential risk factor modification, dietary therapy or pharmacologic therapy may be warranted, if clinically indicated. 4. Colonic diverticulosis without evidence of acute diverticulitis at this time. 5. Additional incidental findings, as above. Electronically Signed   By: Vinnie Langton M.D.   On: 12/20/2018 14:49    Cardiac Studies   2D echo 12/20/18 IMPRESSIONS   1. The left ventricle has normal systolic function with an ejection fraction of 60-65%. The cavity size was normal. Left ventricular diastolic Doppler parameters are indeterminate secondary to atrial fibrillation.  2. The right ventricle has normal systolic function. The cavity was normal. There is no increase in right ventricular wall thickness.  3. Large pleural effusion.  4. There is excessive respiratory variation in the tricuspid valve spectral Doppler velocities.  5. Large pericardial effusion measuring up to 2.5 cm anteriorly. There appears to be a large thrombus anterior to RV. Cannot rule out a prominent fat pad. However, there is free-flowing fluid both anterior and posterior, making thrombus more likely.  There is no evidence of RA or RV diastolic collapse, but the IVC is dilated and does not collapse. There is significant tricuspid valve inflow variation. Mitral inflow pattern is unremarkable. Findings concerning for early tamponade.  6. The mitral valve is normal in structure.  7. The tricuspid valve is normal in structure.  8. The aortic valve was not well visualized.  9. The pulmonic valve was normal in structure. 10. The inferior vena cava was dilated in size with <50% respiratory variability.  Patient Profile     78 y.o. 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 Lorin Mercy.  Assessment & Plan    1. Chest pain with large pericardial effusion with possible thrombus, also pleural effusion - no clear cut precipitating factors to incite pericardial effusion. 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 Quantiferon and EBV are pending CRP/ESR slightly elevated but as expected. HIV neative. CTA negative for dissection. Clinically not in tamponade. Anticoag/platelets on hold due to concern for hemorrhagic effusion. CVTS plans subxiphoid window as both therapeutic and diagnostic modality.  2. Newly recognized atrial fibrillation, rate controlled - CHADSVASCis 4 for HTN, age x 2, DM, vascular disease (coronary/aortic calcification in imaging) so will need to consider long term anticoagulation. However, pericardial effusion may be hemorrhagic given the possibility of surrounding thrombus. He was started on low dose metoprolol by IM and is tolerating well, feeling good. Hold off anticoagulation pending eval of #1. Denies snoring or daytime sleepiness. TSH wnl.  3. Essential HTN - BP controlled.  4. Hyperlipidemia - on home statin. LDL at goal.  For questions or updates, please contact [place] Please consult [email or web address] for contact info under Cardiology/STEMI.  Signed, Charlie Pitter, PA-C 12/21/2018, 8:51 AM    History and all data above reviewed.  Patient examined.  I agree with the findings as above.  Feels well no chest pain.  No SOB.   The patient exam reveals ZPS:[id number]   ,  Lungs: Clear  ,  Abd: Positive bowel sounds, no rebound no guarding, Ext No edema  .  All available labs, radiology testing, previous records reviewed. Agree with documented assessment and plan. Pericardial effusion/mass:  For window  to day.  Further management based on evaluation of fluid.  No significant symptoms.  Atrial fib:  Will need long term DOAC.    Note:  I removed the DNR order.  He did not understand this designation and is a full code.    Jeneen Rinks [place]  11:57 AM  12/21/2018

## 2018-12-21 NOTE — Transfer of Care (Signed)
Immediate Anesthesia Transfer of Care Note  Patient: Ian Estes  Procedure(s) Performed: SUBXYPHOID PERICARDIAL WINDOW (N/A Chest) TRANSESOPHAGEAL ECHOCARDIOGRAM (TEE) (N/A )  Patient Location: PACU  Anesthesia Type:General  Level of Consciousness: awake, alert  and oriented  Airway & Oxygen Therapy: Patient Spontanous Breathing and Patient connected to nasal cannula oxygen  Post-op Assessment: Report given to RN, Post -op Vital signs reviewed and stable and Patient moving all extremities  Post vital signs: Reviewed and stable  Last Vitals:  Vitals Value Taken Time  BP 132/98 12/21/2018  2:48 PM  Temp    Pulse 84 12/21/2018  2:48 PM  Resp 35 12/21/2018  2:50 PM  SpO2 88 % 12/21/2018  2:50 PM  Vitals shown include unvalidated device data.  Last Pain:  Vitals:   12/21/18 1200  TempSrc:   PainSc: 0-No pain      Patients Stated Pain Goal: 0 (16/83/72 9021)  Complications: No apparent anesthesia complications

## 2018-12-21 NOTE — Anesthesia Procedure Notes (Signed)
Central Venous Catheter Insertion Performed by: Myrtie Soman, MD, anesthesiologist Start/End3/01/2019 12:32 PM, 12/21/2018 12:47 PM Patient location: Pre-op. Preanesthetic checklist: patient identified, IV checked, site marked, risks and benefits discussed, surgical consent, monitors and equipment checked, pre-op evaluation, timeout performed and anesthesia consent Position: Trendelenburg Lidocaine 1% used for infiltration and patient sedated Hand hygiene performed  and maximum sterile barriers used  Catheter size: 8 Fr Total catheter length 16. Central line was placed.Double lumen Procedure performed using ultrasound guided technique. Ultrasound Notes:anatomy identified, needle tip was noted to be adjacent to the nerve/plexus identified, no ultrasound evidence of intravascular and/or intraneural injection and image(s) printed for medical record Attempts: 1 Following insertion, dressing applied, line sutured and Biopatch. Post procedure assessment: blood return through all ports  Patient tolerated the procedure well with no immediate complications.

## 2018-12-22 ENCOUNTER — Inpatient Hospital Stay (HOSPITAL_COMMUNITY): Payer: Medicare HMO

## 2018-12-22 ENCOUNTER — Encounter (HOSPITAL_COMMUNITY): Payer: Self-pay | Admitting: Cardiothoracic Surgery

## 2018-12-22 DIAGNOSIS — Z9889 Other specified postprocedural states: Secondary | ICD-10-CM

## 2018-12-22 DIAGNOSIS — E7849 Other hyperlipidemia: Secondary | ICD-10-CM

## 2018-12-22 LAB — CBC
HCT: 44.9 % (ref 39.0–52.0)
Hemoglobin: 14.4 g/dL (ref 13.0–17.0)
MCH: 31.6 pg (ref 26.0–34.0)
MCHC: 32.1 g/dL (ref 30.0–36.0)
MCV: 98.5 fL (ref 80.0–100.0)
Platelets: 215 10*3/uL (ref 150–400)
RBC: 4.56 MIL/uL (ref 4.22–5.81)
RDW: 12.9 % (ref 11.5–15.5)
WBC: 15.1 10*3/uL — ABNORMAL HIGH (ref 4.0–10.5)
nRBC: 0 % (ref 0.0–0.2)

## 2018-12-22 LAB — BASIC METABOLIC PANEL
Anion gap: 7 (ref 5–15)
BUN: 20 mg/dL (ref 8–23)
CO2: 23 mmol/L (ref 22–32)
CREATININE: 1.24 mg/dL (ref 0.61–1.24)
Calcium: 8.5 mg/dL — ABNORMAL LOW (ref 8.9–10.3)
Chloride: 104 mmol/L (ref 98–111)
GFR calc Af Amer: >60 mL/min (ref >60)
GFR calc non Af Amer: 55 mL/min — ABNORMAL LOW (ref >60)
Glucose, Bld: 136 mg/dL — ABNORMAL HIGH (ref 70–99)
Potassium: 4.2 mmol/L (ref 3.5–5.1)
SODIUM: 134 mmol/L — AB (ref 135–145)

## 2018-12-22 LAB — ACID FAST SMEAR (AFB, MYCOBACTERIA)
Acid Fast Smear: NEGATIVE
Acid Fast Smear: NEGATIVE

## 2018-12-22 LAB — QUANTIFERON-TB GOLD PLUS (RQFGPL)
QUANTIFERON TB1 AG VALUE: 0.03 [IU]/mL
QUANTIFERON TB2 AG VALUE: 0.05 [IU]/mL
QuantiFERON Mitogen Value: >10 IU/mL
QuantiFERON Nil Value: 0.11 IU/mL

## 2018-12-22 LAB — RSV(RESPIRATORY SYNCYTIAL VIRUS) AB, BLOOD: RSV Ab: NEGATIVE

## 2018-12-22 LAB — POCT I-STAT 7, (LYTES, BLD GAS, ICA,H+H)
Acid-base deficit: 3 mmol/L — ABNORMAL HIGH (ref 0.0–2.0)
Bicarbonate: 20.9 mmol/L (ref 20.0–28.0)
Calcium, Ion: 1.17 mmol/L (ref 1.15–1.40)
HCT: 48 % (ref 39.0–52.0)
Hemoglobin: 16.3 g/dL (ref 13.0–17.0)
O2 Saturation: 95 %
PO2 ART: 78 mmHg — AB (ref 83.0–108.0)
Potassium: 4.8 mmol/L (ref 3.5–5.1)
Sodium: 135 mmol/L (ref 135–145)
TCO2: 22 mmol/L (ref 22–32)
pCO2 arterial: 35.1 mmHg (ref 32.0–48.0)
pH, Arterial: 7.382 (ref 7.350–7.450)

## 2018-12-22 LAB — GLUCOSE, CAPILLARY
GLUCOSE-CAPILLARY: 123 mg/dL — AB (ref 70–99)
GLUCOSE-CAPILLARY: 169 mg/dL — AB (ref 70–99)
Glucose-Capillary: 164 mg/dL — ABNORMAL HIGH (ref 70–99)
Glucose-Capillary: 156 mg/dL — ABNORMAL HIGH (ref 70–99)
Glucose-Capillary: 164 mg/dL — ABNORMAL HIGH (ref 70–99)

## 2018-12-22 LAB — QUANTIFERON-TB GOLD PLUS: QuantiFERON-TB Gold Plus: NEGATIVE

## 2018-12-22 MED ORDER — COLCHICINE 0.6 MG PO TABS
0.6000 mg | ORAL_TABLET | Freq: Two times a day (BID) | ORAL | Status: DC
  Administered 2018-12-22 – 2018-12-24 (×5): 0.6 mg via ORAL
  Filled 2018-12-22 (×5): qty 1

## 2018-12-22 MED ORDER — ONDANSETRON HCL 4 MG/2ML IJ SOLN
4.0000 mg | Freq: Four times a day (QID) | INTRAMUSCULAR | Status: DC | PRN
  Administered 2018-12-23: 4 mg via INTRAVENOUS
  Filled 2018-12-22: qty 2

## 2018-12-22 MED ORDER — SODIUM CHLORIDE 0.9% FLUSH
3.0000 mL | Freq: Two times a day (BID) | INTRAVENOUS | Status: DC
  Administered 2018-12-22 – 2018-12-24 (×2): 3 mL via INTRAVENOUS

## 2018-12-22 MED ORDER — TRAMADOL HCL 50 MG PO TABS
50.0000 mg | ORAL_TABLET | Freq: Four times a day (QID) | ORAL | Status: DC | PRN
  Administered 2018-12-22: 50 mg via ORAL
  Filled 2018-12-22: qty 1

## 2018-12-22 MED ORDER — ONDANSETRON HCL 4 MG PO TABS: 4.0000 mg | ORAL_TABLET | Freq: Four times a day (QID) | ORAL | Status: DC | PRN

## 2018-12-22 MED ORDER — SODIUM CHLORIDE 0.9 % IV SOLN: 250.0000 mL | INTRAVENOUS | Status: DC | PRN

## 2018-12-22 MED ORDER — SODIUM CHLORIDE 0.9% FLUSH: 3.0000 mL | INTRAVENOUS | Status: DC | PRN

## 2018-12-22 MED ORDER — LEVOFLOXACIN 500 MG PO TABS
500.0000 mg | ORAL_TABLET | Freq: Every day | ORAL | Status: DC
  Administered 2018-12-22 – 2018-12-24 (×3): 500 mg via ORAL
  Filled 2018-12-22 (×4): qty 1

## 2018-12-22 MED ORDER — PANTOPRAZOLE SODIUM 40 MG PO TBEC
40.0000 mg | DELAYED_RELEASE_TABLET | Freq: Every day | ORAL | Status: DC
  Administered 2018-12-23 – 2018-12-24 (×2): 40 mg via ORAL
  Filled 2018-12-22 (×2): qty 1

## 2018-12-22 NOTE — Plan of Care (Signed)
Pt alert and oriented.  Moves all extremities without difficulty.  Tolerating mobilitity.  On RA satting mid 90s.  Afib controlled rate.  BP Soft but pt asymptomatic.  Held all bp meds today.  Tolerating diet.  Passing gas.  Voids in urinal.  Central line and a line dc'd today.  MS CT x 1 to suction remains without any problems noted.  Pain made tolerable with scheduled Tylenol and prn po pain meds.

## 2018-12-22 NOTE — Progress Notes (Signed)
1 Day Post-Op Procedure(s) (LRB): SUBXYPHOID PERICARDIAL WINDOW (N/A) TRANSESOPHAGEAL ECHOCARDIOGRAM (TEE) (N/A) Subjective: Awake and alert, sitting up in the bedside chair eating breakfast.  Except for the incisional soreness, he says he feels much better than yesterday prior to surgery.  No new problems.  Objective: Vital signs in last 24 hours: Temp:  [97.5 F (36.4 C)-98.3 F (36.8 C)] 98.3 F (36.8 C) (03/05 0338) Pulse Rate:  [67-105] 91 (03/05 0600) Cardiac Rhythm: Atrial fibrillation (03/05 0330) Resp:  [11-27] 23 (03/05 0600) BP: (96-136)/(55-98) 96/70 (03/05 0600) SpO2:  [86 %-99 %] 94 % (03/05 0600) Arterial Line BP: (59-155)/(46-71) 67/59 (03/05 0500) Weight:  [103.8 kg-103.9 kg] 103.8 kg (03/05 0553)    Intake/Output from previous day: 03/04 0701 - 03/05 0700 In: 1419.8 [P.O.:120; I.V.:700; IV Piggyback:599.8] Out: 800 [Urine:400; Blood:50; Chest Tube:150] Intake/Output this shift: No intake/output data recorded.  General appearance: alert, cooperative and mild distress Heart: Monitor shows atrial fibrillation with controlled ventricular rate as prior to surgery Lungs: Breath sounds are clear to auscultation anterior and posterior Abdomen: Soft and nontender Wound: Subxiphoid incision is covered with clean dry dressing.  The mediastinal tube is secured.  He has had 70 mL's of serous drainage over the past shift.  Lab Results: Recent Labs    12/21/18 0348 12/22/18 0310 12/22/18 0620  WBC 9.2 15.1*  --   HGB 14.3 14.4 16.3  HCT 43.5 44.9 48.0  PLT 196 215  --    BMET:  Recent Labs    12/21/18 0348 12/22/18 0310 12/22/18 0620  NA 135 134* 135  K 4.0 4.2 4.8  CL 105 104  --   CO2 23 23  --   GLUCOSE 111* 136*  --   BUN 14 20  --   CREATININE 1.21 1.24  --   CALCIUM 8.7* 8.5*  --     PT/INR:  Recent Labs    12/20/18 1643  LABPROT 13.7  INR 1.1   ABG    Component Value Date/Time   PHART 7.382 12/22/2018 0620   HCO3 20.9 12/22/2018 0620   TCO2 22 12/22/2018 0620   ACIDBASEDEF 3.0 (H) 12/22/2018 0620   O2SAT 95.0 12/22/2018 0620   CBG (last 3)  Recent Labs    12/21/18 2104 12/22/18 0622 12/22/18 0736  GLUCAP 132* 123* 164*    Assessment/Plan: S/P Procedure(s) (LRB): SUBXYPHOID PERICARDIAL WINDOW (N/A) TRANSESOPHAGEAL ECHOCARDIOGRAM (TEE) (N/A)  -Postop day 1 subxiphoid drainage of pericardial effusion.  Pathology and cultures are pending.  Gram stains on the pericardial fluid and pericardial sample are negative.  We will continue empiric vancomycin and cefepime until cultures are resulted.  He is hemodynamically stable.  Will discontinue the arterial line.  Transfer to progressive care.  Advance activity.  -Chronic atrial fibrillation-rate controlled.  Continuing beta-blocker.  May resume anticoagulation 24 hours after the mediastinal tubes removed.  -Diabetes mellitus-glucose well controlled with sliding scale, will continue.  -History of acute renal failure-creatinine is high normal.  Urine output is adequate.  We will monitor closely.  -Dyslipidemia-statin resumed  -History of hypertension-control adequate on current medication regimen.  No changes.  -DVT prophylaxis-continue SCDs for now.  May resume enoxaparin tonight.        LOS: 2 days    Antony Odea 12/22/2018

## 2018-12-22 NOTE — Op Note (Signed)
NAMEOSBORNE, SERIO MEDICAL RECORD [id number] ACCOUNT [id number] DATE OF BIRTH:06/06/1941 FACILITY: [facility] LOCATION: [facility] PHYSICIAN:Lekeith Wulf VAN TRIGT III, MD  OPERATIVE REPORT  DATE OF PROCEDURE:  12/21/2018  OPERATION:  Subxiphoid pericardial window.  SURGEON:  [name], MD  PREOPERATIVE DIAGNOSES:  Pericarditis with pericardial effusion.  POSTOPERATIVE DIAGNOSES:  Pericarditis with pericardial effusion.  ANESTHESIA:  General.  CLINICAL NOTE:  The patient is 78 years old who presented to the ED with chest pain.  CT scan imaging ruled out pulmonary embolus and aortic dissection.  Cardiac enzymes were negative.  An echocardiogram showed a 2 cm diameter pericardial effusion  consistent with pericarditis.  There was a questionable thrombus in the pericardial space.  The patient is not on chronic anticoagulation.  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  The patient was in atrial  fibrillation on presentation.  Anticoagulation was held, and a pericardial window was recommended to provide a diagnosis for the pericardial disease.  There was concern that if the patient had hemorrhagic pericarditis, anticoagulation for his atrial  fibrillation would need to be delayed.  There was concern this could be metastatic melanoma causing the effusion as well.  I discussed the procedure subxiphoid pericardial window with the patient, including the use of general anesthesia, the location of the incision, and the expected postoperative recovery which would entail a pericardial drain for approximately 48 hours.   He understood the potential risks of infection, bleeding, recurrent pericardial effusion, organ failure, sepsis and death.  After our discussion, he indicated his understanding of these factors and agreed to proceed with surgery under what I felt was an  informed consent.  OPERATIVE FINDINGS: 1.  Minimally thickened pericardium membrane. 2.   Cloudy fluid, 240 mL, removed from the pericardial space 3.  Epicardium of the heart covered with fat, but no acute process, no evidence of tumor nodules. 4.  Both pericardial tissue and fluid were sent for both cultures and cytopathology.  DESCRIPTION OF PROCEDURE:  The patient was placed supine on the operating table and general anesthesia was induced.  A transesophageal echo probe was placed by the anesthesia team.  The chest and abdomen were prepped and draped as a sterile field.  A  proper time-out was performed.  A small incision was made centered over the xiphoid.  The xiphoid was excised.  The sternal elevating retractor was placed, and the substernal tissue was dissected off the anterior surface of the pericardium.  The  pericardium was opened with a small incision with a 15 blade, and cloudy fluid was removed.  This was sent for the above studies.  The incision was extended to remove an elliptical portion of the pericardial tissue to leave a window.  After drainage of all the fluid, the pericardial space was irrigated with sterile saline.  A 28-French angled tube was placed dependently in the pericardial space and brought out through a separate incision and secured to the skin.  Hemostasis was  obtained.  The incision was closed in layers using Vicryl.  A sterile dressing was applied.  The chest tube was connected to a Pleur-Evac collection chamber.  The patient was extubated and returned to the recovery room in stable condition.  LN/NUANCE  D:12/22/2018 T:12/22/2018 JOB:[id number]

## 2018-12-22 NOTE — Progress Notes (Signed)
Patient ID: Notnamed Scholz, male   DOB: Jan 22, 1941, 78 y.o.   MRN: [id number] TCTS Evening Rounds:  Hemodynamically stable in atrial fib with rate 90's.  sats 93% on RA.  Pericardial tube output low.

## 2018-12-22 NOTE — Progress Notes (Addendum)
PROGRESS NOTE    Ian Estes  OEU:[id number]  DOB: 1940-12-28  DOA: 12/20/2018 PCP: Aletha Halim., PA-C  Brief Narrative:  78 y.o.malewith medical history significant ofDM; prostate CA; HTN; and HLD presenting with chest pain.  Work-up done revealed significant pericardial effusion.  Patient underwent pericardial window /drain placement on 3/4 and transferred to CT surgery service. Cardiology following as well.  Subjective:  Patient resting comfortably and reports that pericardial drain might come out this weekend.  Denies any pain. Objective: Vitals:   12/22/18 1216 12/22/18 1300 12/22/18 1400 12/22/18 1500  BP: (!) 84/67   96/62  Pulse: 95 87 84 83  Resp: (!) 21 11 10 11   Temp:    98 F (36.7 C)  TempSrc:    Oral  SpO2: 92% 91% 94% 92%  Weight:      Height:        Intake/Output Summary (Last 24 hours) at 12/22/2018 1551 Last data filed at 12/22/2018 1200 Gross per 24 hour  Intake 1199.83 ml  Output 750 ml  Net 449.83 ml   Filed Weights   12/20/18 1608 12/21/18 1215 12/22/18 0553  Weight: 103.9 kg 103.9 kg 103.8 kg    Physical Examination:  General exam: Appears calm and comfortable  Respiratory system: Clear to auscultation. Respiratory effort normal. Cardiovascular system: Dressing at pericardial window site with drain in place.  S1 & S2 heard, RRR. No JVD, murmurs, rubs, gallops or clicks.  Trace pedal edema. Gastrointestinal system: Abdomen is nondistended, soft and nontender. No organomegaly or masses felt. Normal bowel sounds heard. Central nervous system: Alert and oriented. No focal neurological deficits. Extremities: Symmetric 5 x 5 power. Skin: No rashes, lesions or ulcers Psychiatry: Judgement and insight appear normal. Mood & affect appropriate.     Data Reviewed: I have personally reviewed following labs and imaging studies  CBC: Recent Labs  Lab 12/20/18 0444 12/21/18 0348 12/22/18 0310 12/22/18 0620  WBC 12.2* 9.2 15.1*  --   HGB  15.9 14.3 14.4 16.3  HCT 47.9 43.5 44.9 48.0  MCV 96.4 96.5 98.5  --   PLT 231 196 215  --    Basic Metabolic Panel: Recent Labs  Lab 12/20/18 0444 12/21/18 0348 12/22/18 0310 12/22/18 0620  NA 134* 135 134* 135  K 4.1 4.0 4.2 4.8  CL 105 105 104  --   CO2 17* 23 23  --   GLUCOSE 164* 111* 136*  --   BUN 16 14 20   --   CREATININE 1.16 1.21 1.24  --   CALCIUM 8.9 8.7* 8.5*  --    GFR: Estimated Creatinine Clearance: 61.2 mL/min (by C-G formula based on SCr of 1.24 mg/dL). Liver Function Tests: Recent Labs  Lab 12/20/18 0444  AST 34  ALT 25  ALKPHOS 144*  BILITOT 1.0  PROT 7.1  ALBUMIN 3.5   Recent Labs  Lab 12/20/18 0444  LIPASE 21   No results for input(s): AMMONIA in the last 168 hours. Coagulation Profile: Recent Labs  Lab 12/20/18 1643  INR 1.1   Cardiac Enzymes: Recent Labs  Lab 12/20/18 0901 12/20/18 1643 12/20/18 2043  TROPONINI <0.03 <0.03 <0.03   BNP (last 3 results) No results for input(s): PROBNP in the last 8760 hours. HbA1C: Recent Labs    12/20/18 0901  HGBA1C 5.6   CBG: Recent Labs  Lab 12/21/18 1449 12/21/18 2104 12/22/18 0622 12/22/18 0736 12/22/18 1126  GLUCAP 107* 132* 123* 164* 156*   Lipid Profile: Recent Labs  12/21/18 0348  CHOL 107  HDL 49  LDLCALC 48  TRIG 49  CHOLHDL 2.2   Thyroid Function Tests: Recent Labs    12/20/18 0901  TSH 1.785   Anemia Panel: No results for input(s): VITAMINB12, FOLATE, FERRITIN, TIBC, IRON, RETICCTPCT in the last 72 hours. Sepsis Labs: No results for input(s): PROCALCITON, LATICACIDVEN in the last 168 hours.  Recent Results (from the past 240 hour(s))  Surgical pcr screen     Status: None   Collection Time: 12/21/18  7:44 AM  Result Value Ref Range Status   MRSA, PCR NEGATIVE NEGATIVE Final   Staphylococcus aureus NEGATIVE NEGATIVE Final    Comment: (NOTE) The Xpert SA Assay (FDA approved for NASAL specimens in patients 85 years of age and older), is one component  of a comprehensive surveillance program. It is not intended to diagnose infection nor to guide or monitor treatment. Performed at [facility], [place] [address], [place], [place] [address]   Aerobic/Anaerobic Culture (surgical/deep wound)     Status: None (Preliminary result)   Collection Time: 12/21/18  1:49 PM  Result Value Ref Range Status   Specimen Description FLUID PERICARDIAL  Final   Special Requests PATIENT ON FOLLOWING VANC  Final   Gram Stain   Final    ABUNDANT WBC PRESENT,BOTH PMN AND MONONUCLEAR NO ORGANISMS SEEN    Culture   Final    NO GROWTH < 24 HOURS Performed at [facility], [place] [address] [place], [place] [address]    Report Status PENDING  Incomplete  Acid Fast Smear (AFB)     Status: None   Collection Time: 12/21/18  1:49 PM  Result Value Ref Range Status   AFB Specimen Processing Concentration  Final   Acid Fast Smear Negative  Final    Comment: (NOTE) Performed At: [facility] [place], [place] [id number] Rush Farmer MD CZ:[id number]    Source (AFB) FLUID  Final    Comment: PERICARDIAL Performed at [facility], [place] [address], [place], [place] [address]   Aerobic/Anaerobic Culture (surgical/deep wound)     Status: None (Preliminary result)   Collection Time: 12/21/18  1:56 PM  Result Value Ref Range Status   Specimen Description TISSUE PERICARDIAL  Final   Special Requests PATIENT ON FOLLOWING VANC  Final   Gram Stain   Final    FEW WBC PRESENT,BOTH PMN AND MONONUCLEAR NO ORGANISMS SEEN    Culture   Final    NO GROWTH < 24 HOURS Performed at [facility], [place] [address] [place], [place] [address]    Report Status PENDING  Incomplete  Acid Fast Smear (AFB)     Status: None   Collection Time: 12/21/18  1:56 PM  Result Value Ref Range Status   AFB Specimen Processing Comment  Final    Comment: Tissue Grinding and Digestion/Decontamination   Acid Fast Smear Negative  Final    Comment:  (NOTE) Performed At: [facility] [place], [place] [id number] Rush Farmer MD CB:[id number]    Source (AFB) TISSUE  Final    Comment: PERICARDIAL Performed at [facility], [place] [address] [place], [place] [address]       Radiology Studies: Dg Chest [place] 1 View  Result Date: 12/22/2018 CLINICAL DATA:  78 year old male status post pericardial window postop day 1. EXAM: PORTABLE CHEST 1 VIEW COMPARISON:  12/21/2018 and earlier. FINDINGS: Portable AP upright view at 0547 hours. Right  lower cardiophrenic region drain remains in place and is likely in the pericardium. Stable enlarged cardiac silhouette. Other mediastinal contours are within normal limits. Stable right IJ approach central line. Visualized tracheal air column is within normal limits. No pneumothorax or pulmonary edema. Stable mild bibasilar hypo ventilation. No definite pleural effusion. Negative visible bowel gas pattern. IMPRESSION: 1. Stable right IJ approach central line and probable pericardial drain. 2. Mild lung base atelectasis. Electronically Signed   By: Genevie Ann M.D.   On: 12/22/2018 09:29   Dg Chest Port 1 View  Result Date: 12/21/2018 CLINICAL DATA:  Status post pericardial window creation. EXAM: PORTABLE CHEST 1 VIEW COMPARISON:  12/20/2018 and chest, abdomen and pelvis CTA dated 12/20/2018. FINDINGS: Mildly progressive enlargement of the cardiopericardial silhouette, accentuated by a decreased inspiration and the portable AP technique. Interval chest or pericardial tube at the medial right lung base. Right jugular catheter tip at the origin of the superior vena cava. No pneumothorax. Clear lungs. Thoracic spine degenerative changes. IMPRESSION: Probably stable enlargement of the cardiopericardial silhouette. No acute abnormality. Electronically Signed   By: Claudie Revering M.D.   On: 12/21/2018 17:34        Scheduled Meds: . acetaminophen  1,000 mg Oral Q6H   Or  . acetaminophen (TYLENOL)  oral liquid 160 mg/5 mL  1,000 mg Oral Q6H  . allopurinol  100 mg Oral Daily  . amLODipine  5 mg Oral Daily  . atorvastatin  20 mg Oral Daily  . bisacodyl  10 mg Oral Daily  . colchicine  0.6 mg Oral BID  . folic acid  1 mg Oral Daily  . insulin aspart  0-15 Units Subcutaneous TID WC  . insulin aspart  0-5 Units Subcutaneous QHS  . levofloxacin  500 mg Oral Daily  . metoCLOPramide (REGLAN) injection  10 mg Intravenous Q6H  . metoprolol tartrate  12.5 mg Oral BID  . senna-docusate  1 tablet Oral QHS  . thiamine  100 mg Oral Daily   Continuous Infusions: . sodium chloride    . potassium chloride      Assessment & Plan:    1. Chest pain/pericarditis/pericardial effusion: Present on admission.  Patient seen by cardiology and CT surgery underwent pericardial window/drainage of nonhemorrhagic pericardial fluid (250 cc).  Small amount of pericardial tissue appeared inflamed and was sent for pathology. Fluid cultures so far negative.  Colchicine initiated for pericarditis by cardiology.  2.  New onset atrial fibrillation: Cardiology following.CHA2DS2-VASc Scoreis >2.  Plan to start anticoagulation once pericardial drain out.  Continue beta-blockers.  3.  Diabetes mellitus: Patient currently on sliding scale insulin.  Blood glucose between 10 7-1 50.  Not on any oral hypoglycemics at home.  Hemoglobin A1c at 5.6.  Continue diabetic diet.  4.  Hyperlipidemia: Resume home medications.    5. HTN: On metoprolol, Norvasc. Will hold Norvasc as SBP in 80 s to 90s.      DVT prophylaxis: SCD, anticoagulation if and when okay per primary service Code Status: Full code      LOS: 2 days    Time spent: 25 minutes    Guilford Shi, MD [facility] Pager [phone]  If 7PM-7AM, please contact night-coverage [email or web address] Password [email or web address] 12/22/2018, 3:51 PM

## 2018-12-22 NOTE — Progress Notes (Signed)
Progress Note  Patient Name: Ian Estes Date of Encounter: 12/22/2018  Primary Cardiologist: Minus Breeding, MD  Subjective   Incisional pain but no distress. No SOB.   Inpatient Medications    Scheduled Meds: . acetaminophen  1,000 mg Oral Q6H   Or  . acetaminophen (TYLENOL) oral liquid 160 mg/5 mL  1,000 mg Oral Q6H  . allopurinol  100 mg Oral Daily  . amLODipine  5 mg Oral Daily  . atorvastatin  20 mg Oral Daily  . bisacodyl  10 mg Oral Daily  . folic acid  1 mg Oral Daily  . insulin aspart  0-15 Units Subcutaneous TID WC  . insulin aspart  0-5 Units Subcutaneous QHS  . levofloxacin  500 mg Oral Daily  . metoCLOPramide (REGLAN) injection  10 mg Intravenous Q6H  . metoprolol tartrate  12.5 mg Oral BID  . senna-docusate  1 tablet Oral QHS  . thiamine  100 mg Oral Daily   Continuous Infusions: . sodium chloride    . potassium chloride     PRN Meds: sodium chloride, clonazePAM, morphine injection, nitroGLYCERIN, ondansetron (ZOFRAN) IV, oxyCODONE, potassium chloride, traMADol   Vital Signs    Vitals:   12/22/18 0700 12/22/18 0800 12/22/18 0900 12/22/18 1000  BP: (!) 89/56 (!) 86/64 106/68   Pulse: 90 92 94   Resp: 13 (!) 9 16 10   Temp:      TempSrc:      SpO2: 92% 93% 91%   Weight:      Height:        Intake/Output Summary (Last 24 hours) at 12/22/2018 1108 Last data filed at 12/22/2018 0800 Gross per 24 hour  Intake 1659.83 ml  Output 800 ml  Net 859.83 ml   Last 3 Weights 12/22/2018 12/21/2018 12/20/2018  Weight (lbs) 228 lb 13.4 oz 229 lb 229 lb  Weight (kg) 103.8 kg 103.874 kg 103.874 kg     Telemetry    NSR  Physical Exam   GEN: No  acute distress.   Neck: No  JVD Cardiac:     IrregularRR, no murmurs, rubs, or gallops.  Respiratory: Clear   to auscultation bilaterally. GI: Soft, nontender, non-distended, normal bowel sounds  MS:  No edema; No deformity. Neuro:   Nonfocal  Psych: Oriented and appropriate   Labs    Chemistry Recent Labs    Lab 12/20/18 0444 12/21/18 0348 12/22/18 0310 12/22/18 0620  NA 134* 135 134* 135  K 4.1 4.0 4.2 4.8  CL 105 105 104  --   CO2 17* 23 23  --   GLUCOSE 164* 111* 136*  --   BUN 16 14 20   --   CREATININE 1.16 1.21 1.24  --   CALCIUM 8.9 8.7* 8.5*  --   PROT 7.1  --   --   --   ALBUMIN 3.5  --   --   --   AST 34  --   --   --   ALT 25  --   --   --   ALKPHOS 144*  --   --   --   BILITOT 1.0  --   --   --   GFRNONAA 60* 57* 55*  --   GFRAA >60 >60 >60  --   ANIONGAP 12 7 7   --      Hematology Recent Labs  Lab 12/20/18 0444 12/21/18 0348 12/22/18 0310 12/22/18 0620  WBC 12.2* 9.2 15.1*  --   RBC 4.97 4.51  4.56  --   HGB 15.9 14.3 14.4 16.3  HCT 47.9 43.5 44.9 48.0  MCV 96.4 96.5 98.5  --   MCH 32.0 31.7 31.6  --   MCHC 33.2 32.9 32.1  --   RDW 13.0 13.2 12.9  --   PLT 231 196 215  --     Cardiac Enzymes Recent Labs  Lab 12/20/18 0901 12/20/18 1643 12/20/18 2043  TROPONINI <0.03 <0.03 <0.03    Recent Labs  Lab 12/20/18 0450  TROPIPOC 0.00     BNP Recent Labs  Lab 12/20/18 0515  BNP 284.6*     DDimer No results for input(s): DDIMER in the last 168 hours.   Radiology    Dg Chest Port 1 View  Result Date: 12/22/2018 CLINICAL DATA:  78 year old male status post pericardial window postop day 1. EXAM: PORTABLE CHEST 1 VIEW COMPARISON:  12/21/2018 and earlier. FINDINGS: Portable AP upright view at 0547 hours. Right lower cardiophrenic region drain remains in place and is likely in the pericardium. Stable enlarged cardiac silhouette. Other mediastinal contours are within normal limits. Stable right IJ approach central line. Visualized tracheal air column is within normal limits. No pneumothorax or pulmonary edema. Stable mild bibasilar hypo ventilation. No definite pleural effusion. Negative visible bowel gas pattern. IMPRESSION: 1. Stable right IJ approach central line and probable pericardial drain. 2. Mild lung base atelectasis. Electronically Signed   By: Genevie Ann M.D.   On: 12/22/2018 09:29   Dg Chest Port 1 View  Result Date: 12/21/2018 CLINICAL DATA:  Status post pericardial window creation. EXAM: PORTABLE CHEST 1 VIEW COMPARISON:  12/20/2018 and chest, abdomen and pelvis CTA dated 12/20/2018. FINDINGS: Mildly progressive enlargement of the cardiopericardial silhouette, accentuated by a decreased inspiration and the portable AP technique. Interval chest or pericardial tube at the medial right lung base. Right jugular catheter tip at the origin of the superior vena cava. No pneumothorax. Clear lungs. Thoracic spine degenerative changes. IMPRESSION: Probably stable enlargement of the cardiopericardial silhouette. No acute abnormality. Electronically Signed   By: Claudie Revering M.D.   On: 12/21/2018 17:34   Ct Angio Chest/abd/pel For Dissection W And/or W/wo  Result Date: 12/20/2018 CLINICAL DATA:  78 year old male with history of chest pain. EXAM: CT ANGIOGRAPHY CHEST, ABDOMEN AND PELVIS TECHNIQUE: Multidetector CT imaging through the chest, abdomen and pelvis was performed using the standard protocol during bolus administration of intravenous contrast. Multiplanar reconstructed images and MIPs were obtained and reviewed to evaluate the vascular anatomy. CONTRAST:  100 mL of Isovue 370. COMPARISON:  Chest CTA 01/16/2019. FINDINGS: CTA CHEST FINDINGS Cardiovascular: On precontrast images there is no crescentic high attenuation associated with the wall of the thoracic aorta to suggest an acute intramural hemorrhage. Post-contrast images demonstrate no evidence of thoracic aortic aneurysm or dissection. There is aortic atherosclerosis, as well as atherosclerosis of the great vessels of the mediastinum and the coronary arteries, including calcified atherosclerotic plaque in the left main, left anterior descending, left circumflex and right coronary arteries. Heart size is normal. Moderate volume of intermediate to high attenuation pericardial fluid, concerning for an  acute pericarditis with proteinaceous exudate of fluid. No pericardial calcification. Mediastinum/Nodes: No pathologically enlarged mediastinal or hilar lymph nodes. Esophagus is unremarkable in appearance. No axillary lymphadenopathy. Lungs/Pleura: No suspicious appearing pulmonary nodules or masses are noted. No acute consolidative airspace disease. No pleural effusions. Musculoskeletal: There are no aggressive appearing lytic or blastic lesions noted in the visualized portions of the skeleton. Review of the MIP images  confirms the above findings. CTA ABDOMEN AND PELVIS FINDINGS VASCULAR Aorta: Aortic atherosclerosis. Normal caliber aorta without aneurysm, dissection, vasculitis or significant stenosis. Celiac: Patent without evidence of aneurysm, dissection or vasculitis. Mild stenosis at the ostium. SMA: Patent without evidence of aneurysm, dissection, vasculitis or significant stenosis. Renals: Both renal arteries are patent without evidence of aneurysm, dissection, vasculitis, fibromuscular dysplasia or significant stenosis. Large accessory artery to the lower pole the left kidney IMA: Patent without evidence of aneurysm, dissection, vasculitis or significant stenosis. Inflow: Patent without evidence of aneurysm, dissection, vasculitis or significant stenosis. Veins: No obvious venous abnormality within the limitations of this arterial phase study. Review of the MIP images confirms the above findings. NON-VASCULAR Hepatobiliary: No suspicious cystic or solid hepatic lesions. No intra or extrahepatic biliary ductal dilatation. Gallbladder is moderately distended, but there are no definite gallstones or surrounding inflammatory changes to suggest an acute cholecystitis at this time. Pancreas: No pancreatic mass. No pancreatic ductal dilatation. No pancreatic or peripancreatic fluid or inflammatory changes. Spleen: Unremarkable. Adrenals/Urinary Tract: 1.6 cm low-attenuation lesion in the interpolar region of the  left kidney, compatible with a simple cyst. Multifocal cortical thinning in both kidneys. Mild adrenal form thickening of both adrenal glands without discrete mass. No hydroureteronephrosis. Urinary bladder is unremarkable in appearance. Stomach/Bowel: Normal appearance of the stomach. No pathologic dilatation of small bowel or colon. A few scattered colonic diverticulae are noted, without surrounding inflammatory changes to suggest an acute diverticulitis at this time. Normal appendix. Lymphatic: No pathologically enlarged lymph nodes are noted in the abdomen or pelvis. Reproductive: Status post prostatectomy. Other: No significant volume of ascites.  No pneumoperitoneum. Musculoskeletal: There are no aggressive appearing lytic or blastic lesions noted in the visualized portions of the skeleton. Review of the MIP images confirms the above findings. IMPRESSION: 1. Moderate volume of high attenuation pericardial fluid, likely reflective of an acute pericarditis. This likely accounts for the patient's history of chest pain. 2. No acute abnormality of the thoracoabdominal aorta. Specifically, no findings to suggest an acute aortic syndrome. 3. Aortic atherosclerosis, in addition to left main and 3 vessel coronary artery disease. Assessment for potential risk factor modification, dietary therapy or pharmacologic therapy may be warranted, if clinically indicated. 4. Colonic diverticulosis without evidence of acute diverticulitis at this time. 5. Additional incidental findings, as above. Electronically Signed   By: Vinnie Langton M.D.   On: 12/20/2018 14:49    Cardiac Studies   2D echo 12/20/18 IMPRESSIONS   1. The left ventricle has normal systolic function with an ejection fraction of 60-65%. The cavity size was normal. Left ventricular diastolic Doppler parameters are indeterminate secondary to atrial fibrillation.  2. The right ventricle has normal systolic function. The cavity was normal. There is no  increase in right ventricular wall thickness.  3. Large pleural effusion.  4. There is excessive respiratory variation in the tricuspid valve spectral Doppler velocities.  5. Large pericardial effusion measuring up to 2.5 cm anteriorly. There appears to be a large thrombus anterior to RV. Cannot rule out a prominent fat pad. However, there is free-flowing fluid both anterior and posterior, making thrombus more likely.  There is no evidence of RA or RV diastolic collapse, but the IVC is dilated and does not collapse. There is significant tricuspid valve inflow variation. Mitral inflow pattern is unremarkable. Findings concerning for early tamponade.  6. The mitral valve is normal in structure.  7. The tricuspid valve is normal in structure.  8. The aortic valve was not  well visualized.  9. The pulmonic valve was normal in structure. 10. The inferior vena cava was dilated in size with <50% respiratory variability.  Patient Profile     78 y.o. 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 Lorin Mercy.  He is now status post window.    Assessment & Plan    1. Chest pain large pericardial effusion  Status post pericardial window.  No hemorrhage.  Possible pericarditis.  I will likely treat with colchicine.  He is not having chest pain at this point.  I will avoid NSAID. Pericardial drain to stay in until tomorrow.  Surgical path and cultures pending.   2. Newly recognized atrial fibrillation, rate controlled  Rate controlled.  I will add Eliquis when tube is out.    3. Essential HTN  BP controlled.  4. Hyperlipidemia  On home statin. LDL at goal.  For questions or updates, please contact [place] Please consult [email or web address] for contact info under Cardiology/STEMI.  Signed, Minus Breeding, MD 12/22/2018, 11:08 AM

## 2018-12-23 ENCOUNTER — Inpatient Hospital Stay (HOSPITAL_COMMUNITY): Payer: Medicare HMO

## 2018-12-23 DIAGNOSIS — I482 Chronic atrial fibrillation, unspecified: Secondary | ICD-10-CM

## 2018-12-23 DIAGNOSIS — I4819 Other persistent atrial fibrillation: Secondary | ICD-10-CM

## 2018-12-23 DIAGNOSIS — N179 Acute kidney failure, unspecified: Secondary | ICD-10-CM

## 2018-12-23 LAB — COMPREHENSIVE METABOLIC PANEL
ALT: 17 U/L (ref 0–44)
AST: 21 U/L (ref 15–41)
Albumin: 2.6 g/dL — ABNORMAL LOW (ref 3.5–5.0)
Alkaline Phosphatase: 113 U/L (ref 38–126)
Anion gap: 12 (ref 5–15)
BUN: 30 mg/dL — ABNORMAL HIGH (ref 8–23)
CO2: 17 mmol/L — ABNORMAL LOW (ref 22–32)
CREATININE: 1.61 mg/dL — AB (ref 0.61–1.24)
Calcium: 8.6 mg/dL — ABNORMAL LOW (ref 8.9–10.3)
Chloride: 104 mmol/L (ref 98–111)
GFR calc Af Amer: 47 mL/min — ABNORMAL LOW (ref >60)
GFR calc non Af Amer: 40 mL/min — ABNORMAL LOW (ref >60)
Glucose, Bld: 152 mg/dL — ABNORMAL HIGH (ref 70–99)
Potassium: 4.2 mmol/L (ref 3.5–5.1)
Sodium: 133 mmol/L — ABNORMAL LOW (ref 135–145)
Total Bilirubin: 1.3 mg/dL — ABNORMAL HIGH (ref 0.3–1.2)
Total Protein: 6.6 g/dL (ref 6.5–8.1)

## 2018-12-23 LAB — CBC
HCT: 49.5 % (ref 39.0–52.0)
Hemoglobin: 16.1 g/dL (ref 13.0–17.0)
MCH: 32 pg (ref 26.0–34.0)
MCHC: 32.5 g/dL (ref 30.0–36.0)
MCV: 98.4 fL (ref 80.0–100.0)
Platelets: 247 10*3/uL (ref 150–400)
RBC: 5.03 MIL/uL (ref 4.22–5.81)
RDW: 13.1 % (ref 11.5–15.5)
WBC: 10.3 10*3/uL (ref 4.0–10.5)
nRBC: 0 % (ref 0.0–0.2)

## 2018-12-23 LAB — GLUCOSE, CAPILLARY
Glucose-Capillary: 151 mg/dL — ABNORMAL HIGH (ref 70–99)
Glucose-Capillary: 117 mg/dL — ABNORMAL HIGH (ref 70–99)
Glucose-Capillary: 136 mg/dL — ABNORMAL HIGH (ref 70–99)
Glucose-Capillary: 110 mg/dL — ABNORMAL HIGH (ref 70–99)

## 2018-12-23 MED ORDER — SODIUM CHLORIDE 0.9 % IV SOLN
INTRAVENOUS | Status: DC
  Administered 2018-12-23: 09:00:00 via INTRAVENOUS

## 2018-12-23 NOTE — Care Management (Signed)
#  8.  S/W   MYRA   @    [employer] @ [phone]   APIXABAN : NON-FORMULARY  1. ELIQUIS  2.5 MG BID COVER- YES CO-PAY- $ 47.00 TIER- 3 DRUG PRIOR APPROVAL- NO  2. ELIQUIS  5 MG BID COVER- YES CO-PAY-  $ 47.00 TIER- 3 DRUG PRIOR APPROVAL- NO  DEDUCTIBLE : NOT MET  PREFERRED PHARMACY : YES [facility]

## 2018-12-23 NOTE — Discharge Summary (Addendum)
Physician Discharge Summary  Patient ID: Ian Estes MRN: [id number] DOB/AGE: 1941-06-24 78 y.o.  Admit date: 12/20/2018 Discharge date: 12/24/2018   Referring: Dr. Percival Spanish, MD Primary Care: Stephens Shire, MD Primary Cardiologist:James Hochrein, MD  Admission Diagnoses: Patient Active Problem List   Diagnosis Date Noted  . S/P pericardial window creation 12/21/2018  . Chest pain 12/20/2018  . Unspecified atrial fibrillation ([place]) 12/20/2018  . Pericardial effusion 12/20/2018  . Chronic abdominal pain 06/13/2014  . Hyperlipidemia 06/13/2014  . Acute renal failure ([place]) 06/13/2014  . Dehydration 06/13/2014  . Diabetes mellitus without complication ([place])   . Hypertension    Discharge Diagnoses:  Principal Problem:   Acute pericarditis with pericardial effusion Active Problems:   Diabetes mellitus without complication (HCC)   Hypertension   Hyperlipidemia   Unspecified atrial fibrillation (HCC)   S/P pericardial window creation   Discharged Condition: good  History of Present Illness: This is a 78 year old male with a past medical history of hypertension, hyperlipidemia, diet controlled diabetes mellitus, prostate cancer, gout, and skin cancer left foot who had acute onset of substernal chest pain. Initially, the patient had summonsed [facility] who wanted him to go to [facility]. However, the patient wanted to go to [facility] so he had his sister take him to [facility] ED directly. He described the chest pain as burning, deep pressure-like sensation, substernal, and was associated with shortness of breath. He did have one episode of nausea as well. Symptoms started suddenly on the day prior to admission. He denied pain radiation and diaphoresis.  In th ED, EKG showed non specific T wave abnormality and atrial fibrillation. Initial Troponin 0 and subsequent Troponins have been ordered. BNP 284.6. CXR showed elevation of the left hemidiaphragm, opacity left base (?  atelectasis, small pleural effusion), and cardiomegaly. CT angio of chest without contrast showed moderate pericardial effusion, no PE, and aortic and coronary atherosclerosis. Echo then showed LVEF 60-65% and large pericardial effusion measuring up to 2.5 cm anteriorly. There appeared to be a large thrombus anterior to RV (cannot rule out a prominent fat pad.)  Dr. Prescott Gum was consulted regarding further evaluation and treatment of large pericardial effusion.  Hospital Course:  CT angiogram of the chest was obtained that ruled out aortic dissection.  Subsequent troponins were negative for acute ischemia.  The patient was admitted to the hospital and remained stable.  He was taken to the operating room on 12/21/2018 where subxiphoid drainage of the pericardial effusion was carried out.  There was no evidence of thrombus as was suggested by preoperative studies.  A biopsy of the pericardium was also obtained.  This was interpreted as acute pericarditis.  Initial Gram stain on the pericardial fluid as well as the pericardium showed no organisms.  There were multiple white cells present.  Following the procedure: Patient was transferred to the ICU.  He remained hemodynamically stable.  Atrial fibrillation continued with controlled ventricular rate on oral beta-blocker.  Decision was made to hold off on anticoagulation until the mediastinal tube was removed.  This was accomplished on postop day 2.  Patient otherwise made a progressive and satisfactory recovery.  He resumed ambulation without difficulty.  Diet was advanced and well-tolerated.  Respiratory status remained stable.  The cardiology service elected not to begin anticoagulation at this stage after conferring with Dr. Ihor Gully.  Arrangements were made for follow-up in the atrial field clinic on 01/03/2019.  If his atrial fibrillation is persistent at that time, he  will be started on anticoagulation per cardiology notes.  Consults: cardiology, Dr.  Percival Spanish  Significant Diagnostic Studies:  EXAM: CT ANGIOGRAPHY CHEST WITH CONTRAST  TECHNIQUE: Multidetector CT imaging of the chest was performed using the standard protocol during bolus administration of intravenous contrast. Multiplanar CT image reconstructions and MIPs were obtained to evaluate the vascular anatomy.  CONTRAST:  47mL OMNIPAQUE IOHEXOL 350 MG/ML SOLN  COMPARISON:  None.  FINDINGS: Cardiovascular: No cardiomegaly. Moderate pericardial effusion that is low-density. Interpretation of serosal enhancement is not possible due to contrast timing. Negative for pulmonary embolism. Limited opacification of the left heart and systemic arterial tree. Aortic and coronary atherosclerotic calcification.  Mediastinum/Nodes: Prominent hilar nodal size, likely reactive.  Lungs/Pleura: Bronchomalacia with intermittent airway narrowing. Mild atelectasis greatest in the left lower lobe. There is no edema, consolidation, effusion, or pneumothorax.  Upper Abdomen: Negative  Musculoskeletal: No acute or aggressive finding.  Spondylosis.  The patient experienced a contrast extravasation that was likely only of the saline flush given the technologist report and quality of contrast bolus.  These results were called by telephone at the time of interpretation on 12/20/2018 at 7:33 am to Dr. Ashok Cordia.  Review of the MIP images confirms the above findings.  IMPRESSION: 1. Moderate pericardial effusion. 2. Negative for pulmonary embolism. 3. Aortic and coronary atherosclerosis. 4. IV extravasation which was likely small volume and primarily saline.   Electronically Signed   By: Monte Fantasia M.D.   On: 12/20/2018 07:36   EXAM: CT ANGIOGRAPHY CHEST, ABDOMEN AND PELVIS  TECHNIQUE: Multidetector CT imaging through the chest, abdomen and pelvis was performed using the standard protocol during bolus administration of intravenous contrast. Multiplanar  reconstructed images and MIPs were obtained and reviewed to evaluate the vascular anatomy.  CONTRAST:  100 mL of Isovue 370.  COMPARISON:  Chest CTA 01/16/2019.  FINDINGS: CTA CHEST FINDINGS  Cardiovascular: On precontrast images there is no crescentic high attenuation associated with the wall of the thoracic aorta to suggest an acute intramural hemorrhage. Post-contrast images demonstrate no evidence of thoracic aortic aneurysm or dissection. There is aortic atherosclerosis, as well as atherosclerosis of the great vessels of the mediastinum and the coronary arteries, including calcified atherosclerotic plaque in the left main, left anterior descending, left circumflex and right coronary arteries. Heart size is normal. Moderate volume of intermediate to high attenuation pericardial fluid, concerning for an acute pericarditis with proteinaceous exudate of fluid. No pericardial calcification.  Mediastinum/Nodes: No pathologically enlarged mediastinal or hilar lymph nodes. Esophagus is unremarkable in appearance. No axillary lymphadenopathy.  Lungs/Pleura: No suspicious appearing pulmonary nodules or masses are noted. No acute consolidative airspace disease. No pleural effusions.  Musculoskeletal: There are no aggressive appearing lytic or blastic lesions noted in the visualized portions of the skeleton.  Review of the MIP images confirms the above findings.  CTA ABDOMEN AND PELVIS FINDINGS  VASCULAR  Aorta: Aortic atherosclerosis. Normal caliber aorta without aneurysm, dissection, vasculitis or significant stenosis.  Celiac: Patent without evidence of aneurysm, dissection or vasculitis. Mild stenosis at the ostium.  SMA: Patent without evidence of aneurysm, dissection, vasculitis or significant stenosis.  Renals: Both renal arteries are patent without evidence of aneurysm, dissection, vasculitis, fibromuscular dysplasia or significant stenosis. Large  accessory artery to the lower pole the left kidney  IMA: Patent without evidence of aneurysm, dissection, vasculitis or significant stenosis.  Inflow: Patent without evidence of aneurysm, dissection, vasculitis or significant stenosis.  Veins: No obvious venous abnormality within the limitations of this arterial phase study.  Review of the MIP images confirms the above findings.  NON-VASCULAR  Hepatobiliary: No suspicious cystic or solid hepatic lesions. No intra or extrahepatic biliary ductal dilatation. Gallbladder is moderately distended, but there are no definite gallstones or surrounding inflammatory changes to suggest an acute cholecystitis at this time.  Pancreas: No pancreatic mass. No pancreatic ductal dilatation. No pancreatic or peripancreatic fluid or inflammatory changes.  Spleen: Unremarkable.  Adrenals/Urinary Tract: 1.6 cm low-attenuation lesion in the interpolar region of the left kidney, compatible with a simple cyst. Multifocal cortical thinning in both kidneys. Mild adrenal form thickening of both adrenal glands without discrete mass. No hydroureteronephrosis. Urinary bladder is unremarkable in appearance.  Stomach/Bowel: Normal appearance of the stomach. No pathologic dilatation of small bowel or colon. A few scattered colonic diverticulae are noted, without surrounding inflammatory changes to suggest an acute diverticulitis at this time. Normal appendix.  Lymphatic: No pathologically enlarged lymph nodes are noted in the abdomen or pelvis.  Reproductive: Status post prostatectomy.  Other: No significant volume of ascites.  No pneumoperitoneum.  Musculoskeletal: There are no aggressive appearing lytic or blastic lesions noted in the visualized portions of the skeleton.  Review of the MIP images confirms the above findings.  IMPRESSION: 1. Moderate volume of high attenuation pericardial fluid, likely reflective of an acute  pericarditis. This likely accounts for the patient's history of chest pain. 2. No acute abnormality of the thoracoabdominal aorta. Specifically, no findings to suggest an acute aortic syndrome. 3. Aortic atherosclerosis, in addition to left main and 3 vessel coronary artery disease. Assessment for potential risk factor modification, dietary therapy or pharmacologic therapy may be warranted, if clinically indicated. 4. Colonic diverticulosis without evidence of acute diverticulitis at this time. 5. Additional incidental findings, as above.   Electronically Signed   By: Vinnie Langton M.D.   On: 12/20/2018 14:49  ECHOCARDIOGRAM REPORT       Patient Name:   Ian Estes Date of Exam: 12/20/2018 Medical Rec #:  [id number]      Height:       72.0 in Accession #:    [id number]     Weight:       237.0 lb Date of Birth:  December 07, 1940      BSA:          2.29 m Patient Age:    66 years       BP:           131/82 mmHg Patient Gender: M              HR:           81 bpm. Exam Location:  Inpatient    Procedure: 2D Echo  Indications:    Chest Pain R07.9   History:        Patient has no prior history of Echocardiogram examinations.                 Risk Factors: Hypertension, Diabetes, Former Smoker and                 Dyslipidemia.   Sonographer:    Mikki Santee RDCS (AE) Referring Phys: [place]    1. The left ventricle has normal systolic function with an ejection fraction of 60-65%. The cavity size was normal. Left ventricular diastolic Doppler parameters are indeterminate secondary to atrial fibrillation.  2. The right ventricle has normal systolic function. The cavity was normal. There is no increase in right ventricular wall thickness.  3.  Large pleural effusion.  4. There is excessive respiratory variation in the tricuspid valve spectral Doppler velocities.  5. Large pericardial effusion measuring up to 2.5 cm anteriorly. There appears to  be a large thrombus anterior to RV. Cannot rule out a prominent fat pad. However, there is free-flowing fluid both anterior and posterior, making thrombus more likely.  There is no evidence of RA or RV diastolic collapse, but the IVC is dilated and does not collapse. There is significant tricuspid valve inflow variation. Mitral inflow pattern is unremarkable. Findings concerning for early tamponade.  6. The mitral valve is normal in structure.  7. The tricuspid valve is normal in structure.  8. The aortic valve was not well visualized.  9. The pulmonic valve was normal in structure. 10. The inferior vena cava was dilated in size with <50% respiratory variability.  FINDINGS  Left Ventricle: The left ventricle has normal systolic function, with an ejection fraction of 60-65%. The cavity size was normal. There is no increase in left ventricular wall thickness. Left ventricular diastolic Doppler parameters are indeterminate  secondary to atrial fibrillation. Right Ventricle: The right ventricle has normal systolic function. The cavity was normal. There is no increase in right ventricular wall thickness. Left Atrium: left atrial size was normal in size Right Atrium: right atrial size was normal in size. Right atrial pressure is estimated at 15 mmHg. Interatrial Septum: No atrial level shunt detected by color flow Doppler. Pericardium: There is no evidence of pericardial effusion. There is excessive respiratory variation in the tricuspid valve spectral Doppler velocities. Large pericardial effusion measuring up to 2.5 cm anteriorly. There appears to be a large thrombus  anterior to RV. Cannot rule out a prominent fat pad. However, there is free-flowing fluid both anterior and posterior, making thrombus more likely. There is no evidence of RA or RV diastolic collapse, but the IVC is dilated and does not collapse. There  is significant tricuspid valve inflow variation. Mitral inflow pattern is  unremarkable. Findings concerning for early tamponade. There is a large pleural effusion in either the left or right lateral region. Mitral Valve: The mitral valve is normal in structure. Mitral valve regurgitation is trivial by color flow Doppler. Tricuspid Valve: The tricuspid valve is normal in structure. Tricuspid valve regurgitation is mild by color flow Doppler. Aortic Valve: The aortic valve was not well visualized Aortic valve regurgitation was not visualized by color flow Doppler. Pulmonic Valve: The pulmonic valve was normal in structure. Pulmonic valve regurgitation is not visualized by color flow Doppler. Venous: The inferior vena cava is dilated in size with less than 50% respiratory variability.   LEFT VENTRICLE PLAX 2D (Teich) LV EF:          73.9 %   Diastology LVIDd:          4.61 cm  LV e' medial: 8.05 cm/s LVIDs:          2.64 cm LV PW:          0.99 cm LV IVS:         0.80 cm LVOT diam:      2.00 cm LV SV:          72 ml LVOT Area:      3.14 cm  RIGHT VENTRICLE RV S prime:     11.40 cm/s TAPSE (M-mode): 1.1 cm RVSP:           29.7 mmHg  LEFT ATRIUM  Index       RIGHT ATRIUM           Index LA diam:        4.60 cm 2.01 cm/m  RA Pressure: 15 mmHg LA Vol (A2C):   46.1 ml 20.13 ml/m RA Area:     12.30 cm LA Vol (A4C):   50.3 ml 21.97 ml/m RA Volume:   21.60 ml  9.43 ml/m LA Biplane Vol: 52.7 ml 23.01 ml/m  AORTIC VALVE LVOT Vmax:   96.27 cm/s LVOT Vmean:  64.400 cm/s LVOT VTI:    0.156 m   AORTA Ao Root diam: 2.90 cm  TRICUSPID VALVE TR Peak grad:   14.7 mmHg TR Vmax:        192.00 cm/s RVSP:           29.7 mmHg    Skeet Latch MD Electronically signed by Skeet Latch MD Signature Date/Time: 12/20/2018/12:00:35 PM    Treatments: Surgery  OPERATIVE REPORT  DATE OF PROCEDURE:  12/21/2018  OPERATION:  Subxiphoid pericardial window.  SURGEON:  [name], MD  PREOPERATIVE DIAGNOSES:  Pericarditis with  pericardial effusion.  POSTOPERATIVE DIAGNOSES:  Pericarditis with pericardial effusion.  ANESTHESIA:  General.  CLINICAL NOTE:  The patient is 78 years old who presented to the ED with chest pain.  CT scan imaging ruled out pulmonary embolus and aortic dissection.  Cardiac enzymes were negative.  An echocardiogram showed a 2 cm diameter pericardial effusion  consistent with pericarditis.  There was a questionable thrombus in the pericardial space.  The patient is not on chronic anticoagulation.  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  The patient was in atrial  fibrillation on presentation.  Anticoagulation was held, and a pericardial window was recommended to provide a diagnosis for the pericardial disease.  There was concern that if the patient had hemorrhagic pericarditis, anticoagulation for his atrial  fibrillation would need to be delayed.  There was concern this could be metastatic melanoma causing the effusion as well.  I discussed the procedure subxiphoid pericardial window with the patient, including the use of general anesthesia, the location of the incision, and the expected postoperative recovery which would entail a pericardial drain for approximately 48 hours.   He understood the potential risks of infection, bleeding, recurrent pericardial effusion, organ failure, sepsis and death.  After our discussion, he indicated his understanding of these factors and agreed to proceed with surgery under what I felt was an  informed consent.  OPERATIVE FINDINGS: 1.  Minimally thickened pericardium membrane. 2.  Cloudy fluid, 240 mL, removed from the pericardial space 3.  Epicardium of the heart covered with fat, but no acute process, no evidence of tumor nodules. 4.  Both pericardial tissue and fluid were sent for both cultures and cytopathology.   Discharge Exam: Blood pressure 122/88, pulse 94, temperature (!) 97.5 F (36.4 C),  temperature source Oral, resp. rate 20, height 6' 0.01" (1.829 m), weight 102.8 kg, SpO2 98 %.   General appearance: alert, cooperative and no distress Cardio: irregularly irregular rhythm Incision/Wound: clean and dry.   Disposition:    Allergies as of 12/24/2018   No Known Allergies     Medication List    STOP taking these medications   amLODipine 5 MG tablet Commonly known as:  NORVASC     TAKE these medications   allopurinol 100 MG tablet Commonly known as:  ZYLOPRIM Take 100 mg by mouth daily.  atorvastatin 20 MG tablet Commonly known as:  LIPITOR Take 20 mg by mouth daily.   clonazePAM 0.5 MG tablet Commonly known as:  KLONOPIN Take 0.5 mg by mouth at bedtime as needed (sleep).   colchicine 0.6 MG tablet Take 1 tablet (0.6 mg total) by mouth 2 (two) times daily.   folic acid 1 MG tablet Commonly known as:  FOLVITE Take 1 tablet (1 mg total) by mouth daily. Start taking on:  December 25, 2018   levofloxacin 500 MG tablet Commonly known as:  LEVAQUIN Take 1 tablet (500 mg total) by mouth daily. Start taking on:  December 25, 2018   metoprolol succinate 25 MG 24 hr tablet Commonly known as:  Toprol XL Take 1 tablet (25 mg total) by mouth daily.   oxyCODONE-acetaminophen 5-325 MG tablet Commonly known as:  Percocet Take 1 tablet by mouth every 4 (four) hours as needed for up to 3 days for severe pain.      Follow-up Information    Prescott Gum, Collier Salina, MD. Go on 01/18/2019.   Specialty:  Cardiothoracic Surgery Why:  You have an appointment with Dr. Prescott Gum on Wednesday 01/18/19 at 4pm.  Please arrive 30 min early for a chest X-ray on the first floor of the same building. Contact information: [address] [place] [address] [phone]        Lendon Colonel, NP. Go on 01/03/2019.   Specialties:  Nurse Practitioner, Radiology, Cardiology Why:  You have an appointment with Jory Sims on Tuesday 01/03/19 at 11:30am. Contact  information: [address] [place] [address] [phone]           Signed: Antony Odea 12/24/2018, 3:11 PM  \ patient examined and medical record reviewed,agree with above note. Tharon Aquas Trigt III 01/02/2019

## 2018-12-23 NOTE — Progress Notes (Signed)
Unit transfer: Patient transferred from 2 Heart via bed. Dressing to chest is clean, dry, and intact. No complaints of chest pain and/or shortness of breath. Alert and oriented x 4. Safety precautions discussed. Patient verbalizes an understanding of safety precautions (use call bell).  Heart rhythm: Afib controlled rate. Out of bed and is able to ambulated to bathroom with 1 assist.

## 2018-12-23 NOTE — Care Management Note (Signed)
Case Management Note  Patient Details  Name: Lionel Woodberry MRN: [id number] Date of Birth: 04-12-1941  Subjective/Objective: 78 yo male presented with CP and found to have moderate-large pericardial effusion; s/p pericardial window              Action/Plan: CM met with patient/family to discuss dispositional needs. Patient lives at home with his spouse, independent with ADLs PTA per patient. PCP verified as: Dr. Bing Matter; Pharmacy of choice: [facility], [place]. Eliquis benefits check completed with est monthly copay cost $47; CM discussed with patient and provided an Eliquis 30-day free card. No further needs from CM.   Expected Discharge Date:                  Expected Discharge Plan:  Home/Self Care  In-House Referral:  NA  Discharge planning Services  CM Consult, Medication Assistance(Eliquis benefits check & 30-day free card)  Post Acute Care Choice:  NA Choice offered to:  NA  DME Arranged:  N/A DME Agency:  NA  HH Arranged:  NA HH Agency:  NA  Status of Service:  In process, will continue to follow  If discussed at Long Length of Stay Meetings, dates discussed:    Additional Comments:  Midge Minium RN, BSN, NCM-BC, ACM-RN [phone] 12/23/2018, 4:07 PM

## 2018-12-23 NOTE — Care Management (Signed)
Eliquis benefits check sent and pending.  Maddie Brazier RN, BSN, NCM-BC, ACM-RN [phone] 

## 2018-12-23 NOTE — Progress Notes (Signed)
Progress Note  Patient Name: Ian Estes Date of Encounter: 12/23/2018  Primary Cardiologist: Minus Breeding, MD  Subjective   No pain.  No SOB.     Inpatient Medications    Scheduled Meds: . acetaminophen  1,000 mg Oral Q6H   Or  . acetaminophen (TYLENOL) oral liquid 160 mg/5 mL  1,000 mg Oral Q6H  . allopurinol  100 mg Oral Daily  . atorvastatin  20 mg Oral Daily  . bisacodyl  10 mg Oral Daily  . colchicine  0.6 mg Oral BID  . folic acid  1 mg Oral Daily  . insulin aspart  0-15 Units Subcutaneous TID WC  . insulin aspart  0-5 Units Subcutaneous QHS  . levofloxacin  500 mg Oral Daily  . metoCLOPramide (REGLAN) injection  10 mg Intravenous Q6H  . metoprolol tartrate  12.5 mg Oral BID  . pantoprazole  40 mg Oral QAC breakfast  . senna-docusate  1 tablet Oral QHS  . sodium chloride flush  3 mL Intravenous Q12H  . thiamine  100 mg Oral Daily   Continuous Infusions: . sodium chloride    . sodium chloride    . sodium chloride 50 mL/hr at 12/23/18 1000  . potassium chloride     PRN Meds: sodium chloride, sodium chloride, clonazePAM, morphine injection, nitroGLYCERIN, ondansetron **OR** ondansetron (ZOFRAN) IV, oxyCODONE, potassium chloride, sodium chloride flush, traMADol   Vital Signs    Vitals:   12/23/18 0915 12/23/18 0930 12/23/18 0945 12/23/18 1000  BP: 117/86 110/69 110/75 107/80  Pulse: 97 (!) 101 100 (!) 107  Resp: 15 12 13  (!) 21  Temp:      TempSrc:      SpO2: 96% 94% 94% 93%  Weight:      Height:        Intake/Output Summary (Last 24 hours) at 12/23/2018 1050 Last data filed at 12/23/2018 1000 Gross per 24 hour  Intake 431.3 ml  Output 161 ml  Net 270.3 ml   Last 3 Weights 12/23/2018 12/22/2018 12/21/2018  Weight (lbs) 231 lb 0.7 oz 228 lb 13.4 oz 229 lb  Weight (kg) 104.8 kg 103.8 kg 103.874 kg     Telemetry    Atrial fib   Physical Exam   GEN: No  acute distress.   Neck: No  JVD Cardiac: IrregularRR, no murmurs, rubs, or gallops.    Respiratory: Clear   to auscultation bilaterally. GI: Soft, nontender, non-distended, normal bowel sounds  MS:  Trace edema; No deformity. Neuro:   Nonfocal  Psych: Oriented and appropriate     Labs    Chemistry Recent Labs  Lab 12/20/18 0444 12/21/18 0348 12/22/18 0310 12/22/18 0620 12/23/18 0613  NA 134* 135 134* 135 133*  K 4.1 4.0 4.2 4.8 4.2  CL 105 105 104  --  104  CO2 17* 23 23  --  17*  GLUCOSE 164* 111* 136*  --  152*  BUN 16 14 20   --  30*  CREATININE 1.16 1.21 1.24  --  1.61*  CALCIUM 8.9 8.7* 8.5*  --  8.6*  PROT 7.1  --   --   --  6.6  ALBUMIN 3.5  --   --   --  2.6*  AST 34  --   --   --  21  ALT 25  --   --   --  17  ALKPHOS 144*  --   --   --  113  BILITOT 1.0  --   --   --  1.3*  GFRNONAA 60* 57* 55*  --  40*  GFRAA >60 >60 >60  --  47*  ANIONGAP 12 7 7   --  12     Hematology Recent Labs  Lab 12/21/18 0348 12/22/18 0310 12/22/18 0620 12/23/18 0613  WBC 9.2 15.1*  --  10.3  RBC 4.51 4.56  --  5.03  HGB 14.3 14.4 16.3 16.1  HCT 43.5 44.9 48.0 49.5  MCV 96.5 98.5  --  98.4  MCH 31.7 31.6  --  32.0  MCHC 32.9 32.1  --  32.5  RDW 13.2 12.9  --  13.1  PLT 196 215  --  247    Cardiac Enzymes Recent Labs  Lab 12/20/18 0901 12/20/18 1643 12/20/18 2043  TROPONINI <0.03 <0.03 <0.03    Recent Labs  Lab 12/20/18 0450  TROPIPOC 0.00     BNP Recent Labs  Lab 12/20/18 0515  BNP 284.6*     DDimer No results for input(s): DDIMER in the last 168 hours.   Radiology    Dg Chest Port 1 View  Result Date: 12/23/2018 CLINICAL DATA:  Chest tube, pneumothorax EXAM: PORTABLE CHEST 1 VIEW COMPARISON:  12/22/2018 FINDINGS: Cardiomegaly. Bibasilar atelectasis. Right basilar chest tube in place. No visible pneumothorax. Interval removal of right central line. Mild vascular congestion. Possible small effusions. IMPRESSION: Cardiomegaly with vascular congestion, bibasilar atelectasis and possible small effusions. No pneumothorax. Electronically  Signed   By: Rolm Baptise M.D.   On: 12/23/2018 07:57   Dg Chest Port 1 View  Result Date: 12/22/2018 CLINICAL DATA:  78 year old male status post pericardial window postop day 1. EXAM: PORTABLE CHEST 1 VIEW COMPARISON:  12/21/2018 and earlier. FINDINGS: Portable AP upright view at 0547 hours. Right lower cardiophrenic region drain remains in place and is likely in the pericardium. Stable enlarged cardiac silhouette. Other mediastinal contours are within normal limits. Stable right IJ approach central line. Visualized tracheal air column is within normal limits. No pneumothorax or pulmonary edema. Stable mild bibasilar hypo ventilation. No definite pleural effusion. Negative visible bowel gas pattern. IMPRESSION: 1. Stable right IJ approach central line and probable pericardial drain. 2. Mild lung base atelectasis. Electronically Signed   By: Genevie Ann M.D.   On: 12/22/2018 09:29   Dg Chest Port 1 View  Result Date: 12/21/2018 CLINICAL DATA:  Status post pericardial window creation. EXAM: PORTABLE CHEST 1 VIEW COMPARISON:  12/20/2018 and chest, abdomen and pelvis CTA dated 12/20/2018. FINDINGS: Mildly progressive enlargement of the cardiopericardial silhouette, accentuated by a decreased inspiration and the portable AP technique. Interval chest or pericardial tube at the medial right lung base. Right jugular catheter tip at the origin of the superior vena cava. No pneumothorax. Clear lungs. Thoracic spine degenerative changes. IMPRESSION: Probably stable enlargement of the cardiopericardial silhouette. No acute abnormality. Electronically Signed   By: Claudie Revering M.D.   On: 12/21/2018 17:34    Cardiac Studies   2D echo 12/20/18 IMPRESSIONS   1. The left ventricle has normal systolic function with an ejection fraction of 60-65%. The cavity size was normal. Left ventricular diastolic Doppler parameters are indeterminate secondary to atrial fibrillation.  2. The right ventricle has normal systolic function.  The cavity was normal. There is no increase in right ventricular wall thickness.  3. Large pleural effusion.  4. There is excessive respiratory variation in the tricuspid valve spectral Doppler velocities.  5. Large pericardial effusion measuring up to 2.5 cm anteriorly. There appears to be a large thrombus anterior to  RV. Cannot rule out a prominent fat pad. However, there is free-flowing fluid both anterior and posterior, making thrombus more likely.  There is no evidence of RA or RV diastolic collapse, but the IVC is dilated and does not collapse. There is significant tricuspid valve inflow variation. Mitral inflow pattern is unremarkable. Findings concerning for early tamponade.  6. The mitral valve is normal in structure.  7. The tricuspid valve is normal in structure.  8. The aortic valve was not well visualized.  9. The pulmonic valve was normal in structure. 10. The inferior vena cava was dilated in size with <50% respiratory variability.  Patient Profile     78 y.o. 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 Lorin Mercy.  He is now status post window.    Assessment & Plan    1. Chest pain large pericardial effusion  Status post pericardial window.  No hemorrhage.  Possible pericarditis.  I started colchicine.  He is not having chest pain at this point.  I will avoid NSAID. Drain out.  No growth on cultures or malignant cells identified.    2. Newly recognized atrial fibrillation, rate controlled  Rate controlled.  Eliquis will need to be started tomorrow.  This is a new diagnosis and he is unaware of the rhythm.  Might need further med adjustments for rate control after we observe his rate with ambulation.    3. Essential HTN  BP controlled.  4. Hyperlipidemia  Continue current  statin.    For questions or updates, please contact [place] Please consult [email or web address] for contact info under Cardiology/STEMI.  Signed, Minus Breeding, MD 12/23/2018, 10:50 AM

## 2018-12-23 NOTE — Progress Notes (Addendum)
[facility] DAILY ICU PROGRESS NOTE                   [address]            [place] [address]          [phone]   2 Days Post-Op Procedure(s) (LRB): SUBXYPHOID PERICARDIAL WINDOW (N/A) TRANSESOPHAGEAL ECHOCARDIOGRAM (TEE) (N/A)  Total Length of Stay:  LOS: 3 days   Subjective: Rested well, denies shortness of breath or pain.  Remains on room air.  Objective: Vital signs in last 24 hours: Temp:  [96.7 F (35.9 C)-98 F (36.7 C)] 97.7 F (36.5 C) (03/06 0635) Pulse Rate:  [83-109] 109 (03/06 0600) Cardiac Rhythm: Atrial fibrillation (03/06 0330) Resp:  [9-21] 20 (03/06 0600) BP: (84-126)/(58-80) 106/66 (03/06 0600) SpO2:  [80 %-96 %] 94 % (03/06 0600) Weight:  [104.8 kg] 104.8 kg (03/06 0500)  Filed Weights   12/21/18 1215 12/22/18 0553 12/23/18 0500  Weight: 103.9 kg 103.8 kg 104.8 kg    Weight change: 0.926 kg   Hemodynamic parameters for last 24 hours:    Intake/Output from previous day: 03/05 0701 - 03/06 0700 In: 600 [P.O.:600] Out: 361 [Urine:300; Stool:1; Chest Tube:60]  Intake/Output this shift: No intake/output data recorded.  Current Meds: Scheduled Meds: . acetaminophen  1,000 mg Oral Q6H   Or  . acetaminophen (TYLENOL) oral liquid 160 mg/5 mL  1,000 mg Oral Q6H  . allopurinol  100 mg Oral Daily  . atorvastatin  20 mg Oral Daily  . bisacodyl  10 mg Oral Daily  . colchicine  0.6 mg Oral BID  . folic acid  1 mg Oral Daily  . insulin aspart  0-15 Units Subcutaneous TID WC  . insulin aspart  0-5 Units Subcutaneous QHS  . levofloxacin  500 mg Oral Daily  . metoCLOPramide (REGLAN) injection  10 mg Intravenous Q6H  . metoprolol tartrate  12.5 mg Oral BID  . pantoprazole  40 mg Oral QAC breakfast  . senna-docusate  1 tablet Oral QHS  . sodium chloride flush  3 mL Intravenous Q12H  . thiamine  100 mg Oral Daily   Continuous Infusions: . sodium chloride    . sodium chloride    . potassium chloride     PRN Meds:.sodium chloride,  sodium chloride, clonazePAM, morphine injection, nitroGLYCERIN, ondansetron **OR** ondansetron (ZOFRAN) IV, oxyCODONE, potassium chloride, sodium chloride flush, traMADol  General appearance: alert, cooperative and mild distress Heart: Monitor shows atrial fibrillation with controlled ventricular rate, this is chronic. Lungs: Breath sounds are clear to auscultation anterior and posterior Abdomen: Soft and nontender Wound: Subxiphoid incision is covered with clean dry dressing.  The mediastinal tube is secured.  He has had minimal drainage over the past shift.  Lab Results: CBC: Recent Labs    12/22/18 0310 12/22/18 0620 12/23/18 0613  WBC 15.1*  --  10.3  HGB 14.4 16.3 16.1  HCT 44.9 48.0 49.5  PLT 215  --  247   BMET:  Recent Labs    12/22/18 0310 12/22/18 0620 12/23/18 0613  NA 134* 135 133*  K 4.2 4.8 4.2  CL 104  --  104  CO2 23  --  17*  GLUCOSE 136*  --  152*  BUN 20  --  30*  CREATININE 1.24  --  1.61*  CALCIUM 8.5*  --  8.6*    CMET: Lab Results  Component Value Date   WBC 10.3 12/23/2018   HGB 16.1 12/23/2018  HCT 49.5 12/23/2018   PLT 247 12/23/2018   GLUCOSE 152 (H) 12/23/2018   CHOL 107 12/21/2018   TRIG 49 12/21/2018   HDL 49 12/21/2018   LDLCALC 48 12/21/2018   ALT 17 12/23/2018   AST 21 12/23/2018   NA 133 (L) 12/23/2018   K 4.2 12/23/2018   CL 104 12/23/2018   CREATININE 1.61 (H) 12/23/2018   BUN 30 (H) 12/23/2018   CO2 17 (L) 12/23/2018   TSH 1.785 12/20/2018   INR 1.1 12/20/2018   HGBA1C 5.6 12/20/2018      PT/INR:  Recent Labs    12/20/18 1643  LABPROT 13.7  INR 1.1   Radiology: No results found.   Assessment/Plan: S/P Procedure(s) (LRB): SUBXYPHOID PERICARDIAL WINDOW (N/A) TRANSESOPHAGEAL ECHOCARDIOGRAM (TEE) (N/A)  -Postop day 2 subxiphoid drainage of pericardial effusion.  Pathology shows c/w acute pericarditis. Fluid and pericardial Cx'x have not yet been read today. Gram stains on the pericardial fluid and  pericardial sample were negative.  Abx changed to oral Levaquin yesterday. He is hemodynamically stable.  Will discontinue the mediastinal tube today.  Transfer to progressive care when bed available.  Advance activity. Repeat CXR in AM  -Chronic atrial fibrillation-rate controlled.  Continuing beta-blocker.  May resume anticoagulation 24 hours after the mediastinal tubes removed.  -Diabetes mellitus-glucose well controlled with sliding scale, will continue.  -History of acute renal failure-creatinine is trending up.  Urine output 376ml past 24 hours.  Will gently hydrate with NS.  We will monitor closely.  -Dyslipidemia-statin resumed  -History of hypertension-control adequate on current medication regimen.  No changes.  -DVT prophylaxis- enoxaparin resumed   Antony Odea , PA-C [phone] 12/23/2018 7:34 AM    I have seen and examined the patient and agree with the assessment and plan as outlined.  Rexene Alberts, MD 12/23/2018 6:31 PM

## 2018-12-23 NOTE — Progress Notes (Signed)
PROGRESS NOTE    Ian Estes  IDP:[id number]  DOB: 1941-03-09  DOA: 12/20/2018 PCP: Aletha Halim., PA-C  Brief Narrative:  78 y.o.malewith medical history significant ofDM; prostate CA; HTN; and HLD presenting with chest pain.  Work-up done revealed significant pericardial effusion.  Patient underwent pericardial window /drain placement on 3/4 and transferred to CT surgery service. Cardiology following as well.  Subjective:  Patient resting comfortably and status post removal of pericardial drain .  Denies any pain. Objective: Vitals:   12/23/18 1200 12/23/18 1500 12/23/18 1600 12/23/18 1611  BP: 114/72 115/64 119/74   Pulse: (!) 101 (!) 102 (!) 105   Resp: 15 17 (!) 22   Temp:    99.6 F (37.6 C)  TempSrc:    Oral  SpO2: 96% 96% 96%   Weight:      Height:        Intake/Output Summary (Last 24 hours) at 12/23/2018 1927 Last data filed at 12/23/2018 1700 Gross per 24 hour  Intake 841.62 ml  Output 351 ml  Net 490.62 ml   Filed Weights   12/21/18 1215 12/22/18 0553 12/23/18 0500  Weight: 103.9 kg 103.8 kg 104.8 kg    Physical Examination:  General exam: Appears calm and comfortable  Respiratory system: Clear to auscultation. Respiratory effort normal. Cardiovascular system: Dressing at pericardial window site with drain in place.  S1 & S2 heard, RRR. No JVD, murmurs, rubs, gallops or clicks.  Trace pedal edema. Gastrointestinal system: Abdomen is nondistended, soft and nontender. No organomegaly or masses felt. Normal bowel sounds heard. Central nervous system: Alert and oriented. No focal neurological deficits. Extremities: Symmetric 5 x 5 power. Skin: No rashes, lesions or ulcers Psychiatry: Judgement and insight appear normal. Mood & affect appropriate.     Data Reviewed: I have personally reviewed following labs and imaging studies  CBC: Recent Labs  Lab 12/20/18 0444 12/21/18 0348 12/22/18 0310 12/22/18 0620 12/23/18 0613  WBC 12.2* 9.2 15.1*   --  10.3  HGB 15.9 14.3 14.4 16.3 16.1  HCT 47.9 43.5 44.9 48.0 49.5  MCV 96.4 96.5 98.5  --  98.4  PLT 231 196 215  --  443   Basic Metabolic Panel: Recent Labs  Lab 12/20/18 0444 12/21/18 0348 12/22/18 0310 12/22/18 0620 12/23/18 0613  NA 134* 135 134* 135 133*  K 4.1 4.0 4.2 4.8 4.2  CL 105 105 104  --  104  CO2 17* 23 23  --  17*  GLUCOSE 164* 111* 136*  --  152*  BUN 16 14 20   --  30*  CREATININE 1.16 1.21 1.24  --  1.61*  CALCIUM 8.9 8.7* 8.5*  --  8.6*   GFR: Estimated Creatinine Clearance: 47.3 mL/min (A) (by C-G formula based on SCr of 1.61 mg/dL (H)). Liver Function Tests: Recent Labs  Lab 12/20/18 0444 12/23/18 0613  AST 34 21  ALT 25 17  ALKPHOS 144* 113  BILITOT 1.0 1.3*  PROT 7.1 6.6  ALBUMIN 3.5 2.6*   Recent Labs  Lab 12/20/18 0444  LIPASE 21   No results for input(s): AMMONIA in the last 168 hours. Coagulation Profile: Recent Labs  Lab 12/20/18 1643  INR 1.1   Cardiac Enzymes: Recent Labs  Lab 12/20/18 0901 12/20/18 1643 12/20/18 2043  TROPONINI <0.03 <0.03 <0.03   BNP (last 3 results) No results for input(s): PROBNP in the last 8760 hours. HbA1C: No results for input(s): HGBA1C in the last 72 hours. CBG: Recent Labs  Lab  12/22/18 1752 12/22/18 2128 12/23/18 0636 12/23/18 1107 12/23/18 1612  GLUCAP 169* 164* 151* 117* 136*   Lipid Profile: Recent Labs    12/21/18 0348  CHOL 107  HDL 49  LDLCALC 48  TRIG 49  CHOLHDL 2.2   Thyroid Function Tests: No results for input(s): TSH, T4TOTAL, FREET4, T3FREE, THYROIDAB in the last 72 hours. Anemia Panel: No results for input(s): VITAMINB12, FOLATE, FERRITIN, TIBC, IRON, RETICCTPCT in the last 72 hours. Sepsis Labs: No results for input(s): PROCALCITON, LATICACIDVEN in the last 168 hours.  Recent Results (from the past 240 hour(s))  Surgical pcr screen     Status: None   Collection Time: 12/21/18  7:44 AM  Result Value Ref Range Status   MRSA, PCR NEGATIVE NEGATIVE Final    Staphylococcus aureus NEGATIVE NEGATIVE Final    Comment: (NOTE) The Xpert SA Assay (FDA approved for NASAL specimens in patients 31 years of age and older), is one component of a comprehensive surveillance program. It is not intended to diagnose infection nor to guide or monitor treatment. Performed at [facility], [place] [address] [place], [place] [address]   Aerobic/Anaerobic Culture (surgical/deep wound)     Status: None (Preliminary result)   Collection Time: 12/21/18  1:49 PM  Result Value Ref Range Status   Specimen Description FLUID PERICARDIAL  Final   Special Requests PATIENT ON FOLLOWING VANC  Final   Gram Stain   Final    ABUNDANT WBC PRESENT,BOTH PMN AND MONONUCLEAR NO ORGANISMS SEEN    Culture   Final    NO GROWTH 2 DAYS NO ANAEROBES ISOLATED; CULTURE IN PROGRESS FOR 5 DAYS Performed at [facility], [place] [address], [place], [place] [address]    Report Status PENDING  Incomplete  Acid Fast Smear (AFB)     Status: None   Collection Time: 12/21/18  1:49 PM  Result Value Ref Range Status   AFB Specimen Processing Concentration  Final   Acid Fast Smear Negative  Final    Comment: (NOTE) Performed At: [facility] [place], [place] [id number] Rush Farmer MD [id number]    Source (AFB) FLUID  Final    Comment: PERICARDIAL Performed at [facility], [place] [address] [place], [place] [address]   Aerobic/Anaerobic Culture (surgical/deep wound)     Status: None (Preliminary result)   Collection Time: 12/21/18  1:56 PM  Result Value Ref Range Status   Specimen Description TISSUE PERICARDIAL  Final   Special Requests PATIENT ON FOLLOWING VANC  Final   Gram Stain   Final    FEW WBC PRESENT,BOTH PMN AND MONONUCLEAR NO ORGANISMS SEEN    Culture   Final    NO GROWTH 2 DAYS NO ANAEROBES ISOLATED; CULTURE IN PROGRESS FOR 5 DAYS Performed at [facility], [place] [address] [place], [place] [address]    Report Status  PENDING  Incomplete  Acid Fast Smear (AFB)     Status: None   Collection Time: 12/21/18  1:56 PM  Result Value Ref Range Status   AFB Specimen Processing Comment  Final    Comment: Tissue Grinding and Digestion/Decontamination   Acid Fast Smear Negative  Final    Comment: (NOTE) Performed At: [facility] [place], [place] [id number] Rush Farmer MD [id number]    Source (AFB) TISSUE  Final    Comment: PERICARDIAL Performed at [facility], [place] [address] [place], [place] [address]  Radiology Studies: Dg Chest Port 1 View  Result Date: 12/23/2018 CLINICAL DATA:  Chest tube, pneumothorax EXAM: PORTABLE CHEST 1 VIEW COMPARISON:  12/22/2018 FINDINGS: Cardiomegaly. Bibasilar atelectasis. Right basilar chest tube in place. No visible pneumothorax. Interval removal of right central line. Mild vascular congestion. Possible small effusions. IMPRESSION: Cardiomegaly with vascular congestion, bibasilar atelectasis and possible small effusions. No pneumothorax. Electronically Signed   By: Rolm Baptise M.D.   On: 12/23/2018 07:57   Dg Chest Port 1 View  Result Date: 12/22/2018 CLINICAL DATA:  78 year old male status post pericardial window postop day 1. EXAM: PORTABLE CHEST 1 VIEW COMPARISON:  12/21/2018 and earlier. FINDINGS: Portable AP upright view at 0547 hours. Right lower cardiophrenic region drain remains in place and is likely in the pericardium. Stable enlarged cardiac silhouette. Other mediastinal contours are within normal limits. Stable right IJ approach central line. Visualized tracheal air column is within normal limits. No pneumothorax or pulmonary edema. Stable mild bibasilar hypo ventilation. No definite pleural effusion. Negative visible bowel gas pattern. IMPRESSION: 1. Stable right IJ approach central line and probable pericardial drain. 2. Mild lung base atelectasis. Electronically Signed   By: Genevie Ann M.D.   On: 12/22/2018 09:29         Scheduled Meds: . acetaminophen  1,000 mg Oral Q6H   Or  . acetaminophen (TYLENOL) oral liquid 160 mg/5 mL  1,000 mg Oral Q6H  . allopurinol  100 mg Oral Daily  . atorvastatin  20 mg Oral Daily  . bisacodyl  10 mg Oral Daily  . colchicine  0.6 mg Oral BID  . folic acid  1 mg Oral Daily  . insulin aspart  0-15 Units Subcutaneous TID WC  . insulin aspart  0-5 Units Subcutaneous QHS  . levofloxacin  500 mg Oral Daily  . metoCLOPramide (REGLAN) injection  10 mg Intravenous Q6H  . metoprolol tartrate  12.5 mg Oral BID  . pantoprazole  40 mg Oral QAC breakfast  . senna-docusate  1 tablet Oral QHS  . sodium chloride flush  3 mL Intravenous Q12H  . thiamine  100 mg Oral Daily   Continuous Infusions: . sodium chloride    . sodium chloride    . sodium chloride 50 mL/hr at 12/23/18 1600  . potassium chloride      Assessment & Plan:    1. Chest pain/pericarditis/pericardial effusion: Present on admission.  Patient seen by cardiology and CT surgery underwent pericardial window/drainage of nonhemorrhagic pericardial fluid (250 cc).  Small amount of pericardial tissue appeared inflamed and was sent for pathology. Fluid cultures so far negative.  Colchicine/Levaquin initiated for pericarditis by cardiology.  2.  New onset atrial fibrillation: Cardiology following.CHA2DS2-VASc Scoreis >2.  Plan to start anticoagulation once pericardial drain out.  Continue beta-blockers.  3.  Diabetes mellitus: Patient currently on sliding scale insulin.  Blood glucose between 10 7-1 50.  Not on any oral hypoglycemics at home.  Hemoglobin A1c at 5.6.  Continue diabetic diet.  4.  AKI: Patient started on ginger IV fluids this morning.  Follow-up labs in a.m.  5. HTN: On metoprolol, Norvasc. Will hold Norvasc as SBP in 80 s to 90s.   6. Hyperlipidemia: Resume home medications.     DVT prophylaxis: SCD, anticoagulation if and when okay per primary service Code Status: Full code      LOS: 3  days    Time spent: 25 minutes    Guilford Shi, MD [facility] Pager [phone]  If 7PM-7AM, please contact  night-coverage [email or web address] Password [facility] 12/23/2018, 7:27 PM

## 2018-12-24 ENCOUNTER — Inpatient Hospital Stay (HOSPITAL_COMMUNITY): Payer: Medicare HMO

## 2018-12-24 LAB — CBC
HCT: 42.8 % (ref 39.0–52.0)
Hemoglobin: 14.2 g/dL (ref 13.0–17.0)
MCH: 32.2 pg (ref 26.0–34.0)
MCHC: 33.2 g/dL (ref 30.0–36.0)
MCV: 97.1 fL (ref 80.0–100.0)
Platelets: 216 10*3/uL (ref 150–400)
RBC: 4.41 MIL/uL (ref 4.22–5.81)
RDW: 12.9 % (ref 11.5–15.5)
WBC: 5.2 10*3/uL (ref 4.0–10.5)
nRBC: 0 % (ref 0.0–0.2)

## 2018-12-24 LAB — BASIC METABOLIC PANEL
Anion gap: 7 (ref 5–15)
BUN: 29 mg/dL — ABNORMAL HIGH (ref 8–23)
CO2: 20 mmol/L — ABNORMAL LOW (ref 22–32)
CREATININE: 1.25 mg/dL — AB (ref 0.61–1.24)
Calcium: 8.4 mg/dL — ABNORMAL LOW (ref 8.9–10.3)
Chloride: 110 mmol/L (ref 98–111)
GFR calc Af Amer: >60 mL/min (ref >60)
GFR calc non Af Amer: 55 mL/min — ABNORMAL LOW (ref >60)
GLUCOSE: 149 mg/dL — AB (ref 70–99)
Potassium: 3.8 mmol/L (ref 3.5–5.1)
Sodium: 137 mmol/L (ref 135–145)

## 2018-12-24 LAB — GLUCOSE, CAPILLARY
Glucose-Capillary: 116 mg/dL — ABNORMAL HIGH (ref 70–99)
Glucose-Capillary: 111 mg/dL — ABNORMAL HIGH (ref 70–99)

## 2018-12-24 MED ORDER — OXYCODONE-ACETAMINOPHEN 5-325 MG PO TABS: 1.0000 | ORAL_TABLET | ORAL | 0 refills | Status: AC | PRN

## 2018-12-24 MED ORDER — METOPROLOL SUCCINATE ER 25 MG PO TB24: 25.0000 mg | ORAL_TABLET | Freq: Every day | ORAL | 2 refills | Status: DC

## 2018-12-24 MED ORDER — COLCHICINE 0.6 MG PO TABS: 0.6000 mg | ORAL_TABLET | Freq: Two times a day (BID) | ORAL | 1 refills | Status: DC

## 2018-12-24 MED ORDER — LEVOFLOXACIN 500 MG PO TABS: 500.0000 mg | ORAL_TABLET | Freq: Every day | ORAL | 0 refills | Status: DC

## 2018-12-24 MED ORDER — FOLIC ACID 1 MG PO TABS: 1.0000 mg | ORAL_TABLET | Freq: Every day | ORAL | 1 refills | Status: DC

## 2018-12-24 NOTE — Plan of Care (Signed)
  Problem: Education: Goal: Knowledge of General Education information will improve Description Including pain rating scale, medication(s)/side effects and non-pharmacologic comfort measures Outcome: Progressing   

## 2018-12-24 NOTE — Progress Notes (Signed)
Pt got discharged to home, discharge instructions provided and patient showed understanding to it, IV taken out,Telemonitor DC,pt left unit in wheelchair with all of the belongings accompanied with a family member (Sister)  Neil Crouch, RN

## 2018-12-24 NOTE — Progress Notes (Signed)
PROGRESS NOTE    Ian Estes  XMI:[id number]  DOB: [birth year]  DOA: 12/20/2018 PCP: Aletha Halim., PA-C  Brief Narrative:  78 y.o.malewith medical history significant ofDM; prostate CA; HTN; and HLD presenting with chest pain.  Work-up done revealed significant pericardial effusion.  Patient underwent pericardial window /drain placement on 3/4 and transferred to CT surgery service. Cardiology following as well.  12/24/2018: Patient seen.  No new complaints.  Discussed extensively with the cardiologist, Dr. Rayann Heman.  As per Dr. Rayann Heman, anticoagulation will be held until next cardiology outpatient visit.  Otherwise, no new complaints.  Subjective:  Patient resting comfortably and status post removal of pericardial drain .  Denies any pain. Objective: Vitals:   12/24/18 1100 12/24/18 1142 12/24/18 1200 12/24/18 1300  BP:  122/88    Pulse:  93  94  Resp: 17  (!) 22 20  Temp:  (!) 97.5 F (36.4 C)    TempSrc:  Oral    SpO2:  97%  98%  Weight:      Height:        Intake/Output Summary (Last 24 hours) at 12/24/2018 1550 Last data filed at 12/24/2018 0630 Gross per 24 hour  Intake 830.31 ml  Output 227 ml  Net 603.31 ml   Filed Weights   12/22/18 0553 12/23/18 0500 12/24/18 0300  Weight: 103.8 kg 104.8 kg 102.8 kg    Physical Examination:  General exam: Appears calm and comfortable  Respiratory system: Clear to auscultation. Respiratory effort normal. Cardiovascular system: Dressing at pericardial window site with drain in place.  S1 & S2 heard Gastrointestinal system: Abdomen is obese, soft and nontender.  Organs are difficult to assess.   Central nervous system: Alert and oriented. No focal neurological deficits. Extremities: No edema.  Data Reviewed: I have personally reviewed following labs and imaging studies  CBC: Recent Labs  Lab 12/20/18 0444 12/21/18 0348 12/22/18 0310 12/22/18 0620 12/23/18 0613 12/24/18 0223  WBC 12.2* 9.2 15.1*  --  10.3 5.2  HGB  15.9 14.3 14.4 16.3 16.1 14.2  HCT 47.9 43.5 44.9 48.0 49.5 42.8  MCV 96.4 96.5 98.5  --  98.4 97.1  PLT 231 196 215  --  247 825   Basic Metabolic Panel: Recent Labs  Lab 12/20/18 0444 12/21/18 0348 12/22/18 0310 12/22/18 0620 12/23/18 0613 12/24/18 0223  NA 134* 135 134* 135 133* 137  K 4.1 4.0 4.2 4.8 4.2 3.8  CL 105 105 104  --  104 110  CO2 17* 23 23  --  17* 20*  GLUCOSE 164* 111* 136*  --  152* 149*  BUN 16 14 20   --  30* 29*  CREATININE 1.16 1.21 1.24  --  1.61* 1.25*  CALCIUM 8.9 8.7* 8.5*  --  8.6* 8.4*   GFR: Estimated Creatinine Clearance: 60.4 mL/min (A) (by C-G formula based on SCr of 1.25 mg/dL (H)). Liver Function Tests: Recent Labs  Lab 12/20/18 0444 12/23/18 0613  AST 34 21  ALT 25 17  ALKPHOS 144* 113  BILITOT 1.0 1.3*  PROT 7.1 6.6  ALBUMIN 3.5 2.6*   Recent Labs  Lab 12/20/18 0444  LIPASE 21   No results for input(s): AMMONIA in the last 168 hours. Coagulation Profile: Recent Labs  Lab 12/20/18 1643  INR 1.1   Cardiac Enzymes: Recent Labs  Lab 12/20/18 0901 12/20/18 1643 12/20/18 2043  TROPONINI <0.03 <0.03 <0.03   BNP (last 3 results) No results for input(s): PROBNP in the last 8760 hours. HbA1C:  No results for input(s): HGBA1C in the last 72 hours. CBG: Recent Labs  Lab 12/23/18 1107 12/23/18 1612 12/23/18 2128 12/24/18 0618 12/24/18 1142  GLUCAP 117* 136* 110* 116* 111*   Lipid Profile: No results for input(s): CHOL, HDL, LDLCALC, TRIG, CHOLHDL, LDLDIRECT in the last 72 hours. Thyroid Function Tests: No results for input(s): TSH, T4TOTAL, FREET4, T3FREE, THYROIDAB in the last 72 hours. Anemia Panel: No results for input(s): VITAMINB12, FOLATE, FERRITIN, TIBC, IRON, RETICCTPCT in the last 72 hours. Sepsis Labs: No results for input(s): PROCALCITON, LATICACIDVEN in the last 168 hours.  Recent Results (from the past 240 hour(s))  Surgical pcr screen     Status: None   Collection Time: 12/21/18  7:44 AM  Result  Value Ref Range Status   MRSA, PCR NEGATIVE NEGATIVE Final   Staphylococcus aureus NEGATIVE NEGATIVE Final    Comment: (NOTE) The Xpert SA Assay (FDA approved for NASAL specimens in patients 79 years of age and older), is one component of a comprehensive surveillance program. It is not intended to diagnose infection nor to guide or monitor treatment. Performed at [facility], [place] [address] [place], [place] [address]   Aerobic/Anaerobic Culture (surgical/deep wound)     Status: None (Preliminary result)   Collection Time: 12/21/18  1:49 PM  Result Value Ref Range Status   Specimen Description FLUID PERICARDIAL  Final   Special Requests PATIENT ON FOLLOWING VANC  Final   Gram Stain   Final    ABUNDANT WBC PRESENT,BOTH PMN AND MONONUCLEAR NO ORGANISMS SEEN    Culture   Final    NO GROWTH 3 DAYS NO ANAEROBES ISOLATED; CULTURE IN PROGRESS FOR 5 DAYS Performed at [facility], [place] [address], [place], [place] [address]    Report Status PENDING  Incomplete  Acid Fast Smear (AFB)     Status: None   Collection Time: 12/21/18  1:49 PM  Result Value Ref Range Status   AFB Specimen Processing Concentration  Final   Acid Fast Smear Negative  Final    Comment: (NOTE) Performed At: [facility] [place], [place] [id number] Rush Farmer MD [id number]    Source (AFB) FLUID  Final    Comment: PERICARDIAL Performed at [facility], [place] [address] [place], [place] [address]   Aerobic/Anaerobic Culture (surgical/deep wound)     Status: None (Preliminary result)   Collection Time: 12/21/18  1:56 PM  Result Value Ref Range Status   Specimen Description TISSUE PERICARDIAL  Final   Special Requests PATIENT ON FOLLOWING VANC  Final   Gram Stain   Final    FEW WBC PRESENT,BOTH PMN AND MONONUCLEAR NO ORGANISMS SEEN    Culture   Final    NO GROWTH 3 DAYS NO ANAEROBES ISOLATED; CULTURE IN PROGRESS FOR 5 DAYS Performed at [facility],  [place] [address] [place], [place] [address]    Report Status PENDING  Incomplete  Acid Fast Smear (AFB)     Status: None   Collection Time: 12/21/18  1:56 PM  Result Value Ref Range Status   AFB Specimen Processing Comment  Final    Comment: Tissue Grinding and Digestion/Decontamination   Acid Fast Smear Negative  Final    Comment: (NOTE) Performed At: [facility] [place], [place] [id number] Rush Farmer MD [id number]    Source (AFB) TISSUE  Final    Comment: PERICARDIAL Performed at [facility], [place],  [place] [address]       Radiology Studies: Dg Chest 2 View  Result Date: 12/24/2018 CLINICAL DATA:  Status post pericardial window creation on 12/21/2018. No current chest complaints. EXAM: CHEST - 2 VIEW COMPARISON:  Yesterday. FINDINGS: Stable enlarged cardiac silhouette. Clear lungs with normal vascularity. Mild peribronchial thickening. Probable small bilateral posterior pleural effusions. Minimal thoracic spine degenerative changes. IMPRESSION: 1. Probable small bilateral posterior pleural effusions. 2. Resolved bibasilar atelectasis. 3. Stable cardiomegaly. 4. Mild bronchitic changes. Electronically Signed   By: Claudie Revering M.D.   On: 12/24/2018 12:16   Dg Chest Port 1 View  Result Date: 12/23/2018 CLINICAL DATA:  Chest tube, pneumothorax EXAM: PORTABLE CHEST 1 VIEW COMPARISON:  12/22/2018 FINDINGS: Cardiomegaly. Bibasilar atelectasis. Right basilar chest tube in place. No visible pneumothorax. Interval removal of right central line. Mild vascular congestion. Possible small effusions. IMPRESSION: Cardiomegaly with vascular congestion, bibasilar atelectasis and possible small effusions. No pneumothorax. Electronically Signed   By: Rolm Baptise M.D.   On: 12/23/2018 07:57        Scheduled Meds: . acetaminophen  1,000 mg Oral Q6H   Or  . acetaminophen (TYLENOL) oral liquid 160 mg/5 mL  1,000 mg Oral Q6H  . allopurinol  100 mg  Oral Daily  . atorvastatin  20 mg Oral Daily  . bisacodyl  10 mg Oral Daily  . colchicine  0.6 mg Oral BID  . folic acid  1 mg Oral Daily  . insulin aspart  0-15 Units Subcutaneous TID WC  . insulin aspart  0-5 Units Subcutaneous QHS  . levofloxacin  500 mg Oral Daily  . metoCLOPramide (REGLAN) injection  10 mg Intravenous Q6H  . metoprolol tartrate  12.5 mg Oral BID  . pantoprazole  40 mg Oral QAC breakfast  . senna-docusate  1 tablet Oral QHS  . sodium chloride flush  3 mL Intravenous Q12H  . thiamine  100 mg Oral Daily   Continuous Infusions: . sodium chloride    . sodium chloride    . sodium chloride 50 mL/hr at 12/23/18 1600  . potassium chloride      Assessment & Plan:    1. Chest pain/pericarditis/pericardial effusion: Present on admission.  Patient seen by cardiology and CT surgery underwent pericardial window/drainage of nonhemorrhagic pericardial fluid (250 cc).  Small amount of pericardial tissue appeared inflamed and was sent for pathology. Fluid cultures so far negative.  Colchicine/Levaquin initiated for pericarditis by cardiology. 12/24/2018: Stable.  2.  New onset atrial fibrillation: Cardiology following.CHA2DS2-VASc Scoreis >2.  Plan to start anticoagulation once pericardial drain out.  Continue beta-blockers. 12/24/2018: Start anticoagulation and outpatient basis as per cardiology team.  3.  Diabetes mellitus: Patient currently on sliding scale insulin.  Blood glucose between 10 7-1 50.  Not on any oral hypoglycemics at home.  Hemoglobin A1c at 5.6.  Continue diabetic diet.  4.  AKI: Patient started on ginger IV fluids this morning.  Follow-up labs in a.m. 12/24/2018: Serum creatinine is 1.25 today.  AKI has resolved significantly.  5. HTN: On metoprolol, Norvasc. Will hold Norvasc as SBP in 80 s to 90s.   6. Hyperlipidemia: Resume home medications.     DVT prophylaxis: SCD, anticoagulation if and when okay per primary service Code Status: Full code       LOS: 4 days    Time spent: 25 minutes    Bonnell Public, MD [facility] Pager [phone]  If 7PM-7AM, please contact night-coverage [email or web address] Password [email or web address] 12/24/2018, 3:50 PM

## 2018-12-24 NOTE — Progress Notes (Addendum)
TCTS BRIEF PROGRESS NOTE  3 Days Post-Op  S/P Procedure(s) (LRB): SUBXYPHOID PERICARDIAL WINDOW (N/A) TRANSESOPHAGEAL ECHOCARDIOGRAM (TEE) (N/A)   Doing well Pericardial drain out yesterday CXR looks good Pericardial fluid cultures and cytology all negative  Plan: Per medical teams but appears ready for d/c home.  We will arrange for routine follow up in our office.  I favor holding off on DOAC for at least a week due to risk of bleeding.  Rexene Alberts, MD 12/24/2018 10:57 AM

## 2018-12-24 NOTE — Progress Notes (Signed)
Progress Note   Subjective   Doing well today, the patient denies CP or SOB.  No new concerns.  He wants to go home.  Inpatient Medications    Scheduled Meds: . acetaminophen  1,000 mg Oral Q6H   Or  . acetaminophen (TYLENOL) oral liquid 160 mg/5 mL  1,000 mg Oral Q6H  . allopurinol  100 mg Oral Daily  . atorvastatin  20 mg Oral Daily  . bisacodyl  10 mg Oral Daily  . colchicine  0.6 mg Oral BID  . folic acid  1 mg Oral Daily  . insulin aspart  0-15 Units Subcutaneous TID WC  . insulin aspart  0-5 Units Subcutaneous QHS  . levofloxacin  500 mg Oral Daily  . metoCLOPramide (REGLAN) injection  10 mg Intravenous Q6H  . metoprolol tartrate  12.5 mg Oral BID  . pantoprazole  40 mg Oral QAC breakfast  . senna-docusate  1 tablet Oral QHS  . sodium chloride flush  3 mL Intravenous Q12H  . thiamine  100 mg Oral Daily   Continuous Infusions: . sodium chloride    . sodium chloride    . sodium chloride 50 mL/hr at 12/23/18 1600  . potassium chloride     PRN Meds: sodium chloride, sodium chloride, clonazePAM, morphine injection, nitroGLYCERIN, ondansetron **OR** ondansetron (ZOFRAN) IV, oxyCODONE, potassium chloride, sodium chloride flush, traMADol   Vital Signs    Vitals:   12/24/18 1000 12/24/18 1100 12/24/18 1142 12/24/18 1200  BP:   122/88   Pulse: 87  93   Resp: 19 17  (!) 22  Temp:   (!) 97.5 F (36.4 C)   TempSrc:   Oral   SpO2: 95%  97%   Weight:      Height:        Intake/Output Summary (Last 24 hours) at 12/24/2018 1256 Last data filed at 12/24/2018 0630 Gross per 24 hour  Intake 980.52 ml  Output 427 ml  Net 553.52 ml   Filed Weights   12/22/18 0553 12/23/18 0500 12/24/18 0300  Weight: 103.8 kg 104.8 kg 102.8 kg    Telemetry    Rate controlled afib - Personally Reviewed  Physical Exam   GEN- The patient is well appearing, alert and oriented x 3 today.   Head- normocephalic, atraumatic Eyes-  Sclera clear, conjunctiva pink Ears- hearing  intact Oropharynx- clear Neck- supple, Lungs- Clear to ausculation bilaterally, normal work of breathing Heart- irregular rate and rhythm  GI- soft, NT, ND, + BS Extremities- no clubbing, cyanosis, or edema  MS- no significant deformity or atrophy Skin- no rash or lesion Psych- euthymic mood, full affect Neuro- strength and sensation are intact   Labs    Chemistry Recent Labs  Lab 12/20/18 0444  12/22/18 0310 12/22/18 0620 12/23/18 0613 12/24/18 0223  NA 134*   < > 134* 135 133* 137  K 4.1   < > 4.2 4.8 4.2 3.8  CL 105   < > 104  --  104 110  CO2 17*   < > 23  --  17* 20*  GLUCOSE 164*   < > 136*  --  152* 149*  BUN 16   < > 20  --  30* 29*  CREATININE 1.16   < > 1.24  --  1.61* 1.25*  CALCIUM 8.9   < > 8.5*  --  8.6* 8.4*  PROT 7.1  --   --   --  6.6  --   ALBUMIN 3.5  --   --   --  2.6*  --   AST 34  --   --   --  21  --   ALT 25  --   --   --  17  --   ALKPHOS 144*  --   --   --  113  --   BILITOT 1.0  --   --   --  1.3*  --   GFRNONAA 60*   < > 55*  --  40* 55*  GFRAA >60   < > >60  --  47* >60  ANIONGAP 12   < > 7  --  12 7   < > = values in this interval not displayed.     Hematology Recent Labs  Lab 12/22/18 0310 12/22/18 0620 12/23/18 0613 12/24/18 0223  WBC 15.1*  --  10.3 5.2  RBC 4.56  --  5.03 4.41  HGB 14.4 16.3 16.1 14.2  HCT 44.9 48.0 49.5 42.8  MCV 98.5  --  98.4 97.1  MCH 31.6  --  32.0 32.2  MCHC 32.1  --  32.5 33.2  RDW 12.9  --  13.1 12.9  PLT 215  --  247 216    Cardiac Enzymes Recent Labs  Lab 12/20/18 0901 12/20/18 1643 12/20/18 2043  TROPONINI <0.03 <0.03 <0.03    Recent Labs  Lab 12/20/18 0450  TROPIPOC 0.00        Assessment & Plan    1.  Persistent Afib Likely secondary to pericardial effusion Hopefully this will resolve with time He is rate controlled and without symptoms chads2vasc score is 4. I have discussed with Dr Roxy Manns this am and we agree that we should wait several more days before starting  anticoagulation.  At this point, the daily risk for stroke is probably less than his risk of pericardial bleeding. My preference would be to discharge today and have the patient follow-up in the AF clinic this coming week (tues or wed).  I will arrange this follow-up.  If no concerns at that time, I think it would be ok to start eliquis.  If still in AF in 3-4 weeks, then cardioversion at that time would be directed by AF clinic team. Continue current metoprolol dose at discharge  2. Pericardial effusion Avoid NSAIDs No eliquis until follow-up in AF clinic  3. HTN Stable No change required today  I have spoken with hospitalist and surgical teams.  We agree that discharge today is reasonable with close outpatient follow-up.  As above, I have sent message to AF clinic team for follow-up this coming week (tues or wed).  Thompson Grayer MD, [facility] 12/24/2018 12:56 PM

## 2018-12-24 NOTE — Discharge Instructions (Signed)
My remove dressing and leave incision open to air.  May shower but otherwise keep incisions clean and dry.

## 2018-12-26 LAB — AEROBIC/ANAEROBIC CULTURE W GRAM STAIN (SURGICAL/DEEP WOUND)
Culture: NO GROWTH
Culture: NO GROWTH

## 2018-12-27 ENCOUNTER — Telehealth (HOSPITAL_COMMUNITY): Payer: Self-pay | Admitting: *Deleted

## 2018-12-27 NOTE — Telephone Encounter (Signed)
Called to offer appointment for follow up per Dr. Rayann Heman recommendations. Patient refused open appts for this week as he will see Jory Sims PA with [facility] on 3/17 would prefer to wait and be seen there. Pt wife understands to call back if they are unable to keep this appointment.

## 2018-12-27 NOTE — Telephone Encounter (Signed)
-----   Message from Thompson Grayer, MD sent at 12/24/2018 12:54 PM EST ----- Needs hospital follow-up in AF clinic this week.

## 2019-01-03 ENCOUNTER — Ambulatory Visit: Payer: Medicare HMO | Admitting: Adult Health

## 2019-01-03 ENCOUNTER — Other Ambulatory Visit: Payer: Self-pay

## 2019-01-03 ENCOUNTER — Encounter: Payer: Self-pay | Admitting: Adult Health

## 2019-01-03 VITALS — BP 114/74 | HR 63 | Temp 97.5°F | Ht 72.0 in | Wt 228.6 lb

## 2019-01-03 DIAGNOSIS — E7800 Pure hypercholesterolemia, unspecified: Secondary | ICD-10-CM

## 2019-01-03 DIAGNOSIS — I4819 Other persistent atrial fibrillation: Secondary | ICD-10-CM

## 2019-01-03 DIAGNOSIS — I309 Acute pericarditis, unspecified: Secondary | ICD-10-CM | POA: Diagnosis not present

## 2019-01-03 DIAGNOSIS — Z79899 Other long term (current) drug therapy: Secondary | ICD-10-CM

## 2019-01-03 MED ORDER — APIXABAN 5 MG PO TABS: 5.0000 mg | ORAL_TABLET | Freq: Two times a day (BID) | ORAL | 11 refills | Status: DC

## 2019-01-03 MED ORDER — COLCHICINE 0.6 MG PO TABS: 0.6000 mg | ORAL_TABLET | Freq: Two times a day (BID) | ORAL | 0 refills | Status: DC

## 2019-01-03 MED ORDER — METOPROLOL SUCCINATE ER 25 MG PO TB24: 25.0000 mg | ORAL_TABLET | Freq: Every day | ORAL | 3 refills | Status: DC

## 2019-01-03 NOTE — Progress Notes (Signed)
Cardiology Office Note   Date:  01/03/2019   ID:  Ian Estes, DOB 06/07/1941, MRN [id number]  PCP:  Aletha Halim., PA-C  Cardiologist:  Dr. Percival Spanish  Chief Complaint  Patient presents with  . Hospitalization Follow-up  . Pericardial Effusion  . Atrial Fibrillation     History of Present Illness: Ian Estes is a 78 y.o. male who presents for post hospital follow up after admission for chest pain described as deep burning, and pressure with associated dyspnea. [facility] His EKG revealed atrial fib non-specific T-wave abnormality. CT scan revealed moderate pericardial effusion.   Echo revealed normal LVEF of 60%-65% and large pericardial effusion measuring up to 2.5 cm and a large thrombus anterior to RV. Dr. Nils Pyle was consulted for pericardia effusion. He subsequently taken to the operating room on 12/21/2018 where a subxiphoid drainage of a pericardial effusion was performed. A biopsy was also completed. No thrombus was identified.    He was to follow up with the Afib clinic.   He comes today without new complaints. He is sore in his chest from the pericardial window but it not having worsening DOE, dizziness or chest pressure.  He was not started on anticoagulation on discharge despite approval from cardiology and CVTS.   Past Medical History:  Diagnosis Date  . Arthritis    "all my joints" (06/13/2014)  . 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 Gout   . High cholesterol   . Hypertension   . Obesity   . Pneumonia 2014 X 1  . Prostate cancer ([place])   . Type II diabetes mellitus ([place])     Past Surgical History:  Procedure Laterality Date  . INCISION AND DRAINAGE PERIRECTAL ABSCESS  1990's   "hemorrhoid"  . KNEE ARTHROSCOPY Right ~ 2011  . PROSTATE SURGERY  1996  . SKIN CANCER EXCISION  ~ 2005 X 2   "left foot"  . SUBXYPHOID PERICARDIAL WINDOW N/A 12/21/2018   Procedure: SUBXYPHOID PERICARDIAL WINDOW;  Surgeon:  Ivin Poot, MD;  Location: [facility];  Service: Thoracic;  Laterality: N/A;  . TEE WITHOUT CARDIOVERSION N/A 12/21/2018   Procedure: TRANSESOPHAGEAL ECHOCARDIOGRAM (TEE);  Surgeon: Prescott Gum, Collier Salina, MD;  Location[facility] OR;  Service: Thoracic;  Laterality: N/A;     Current Outpatient Medications  Medication Sig Dispense Refill  . allopurinol (ZYLOPRIM) 100 MG tablet Take 100 mg by mouth daily.    Marland Kitchen atorvastatin (LIPITOR) 20 MG tablet Take 20 mg by mouth daily.    . clonazePAM (KLONOPIN) 0.5 MG tablet Take 0.5 mg by mouth at bedtime as needed (sleep).    . colchicine 0.6 MG tablet Take 1 tablet (0.6 mg total) by mouth 2 (two) times daily. 60 tablet 0  . folic acid (FOLVITE) 1 MG tablet Take 1 tablet (1 mg total) by mouth daily. 30 tablet 1  . levofloxacin (LEVAQUIN) 500 MG tablet Take 1 tablet (500 mg total) by mouth daily. 8 tablet 0  . metoprolol succinate (TOPROL XL) 25 MG 24 hr tablet Take 1 tablet (25 mg total) by mouth daily. 30 tablet 3  . apixaban (ELIQUIS) 5 MG TABS tablet Take 1 tablet (5 mg total) by mouth 2 (two) times daily. 60 tablet 11   No current facility-administered medications for this visit.     Allergies:   Patient has no known allergies.    Social History:  The patient  reports that he quit smoking  about 31 years ago. His smoking use included cigarettes. He has a 48.00 pack-year smoking history. He has never used smokeless tobacco. He reports current alcohol use of about 21.0 standard drinks of alcohol per week. He reports that he does not use drugs.   Family History:  The patient's family history is not on file.    ROS: All other systems are reviewed and negative. Unless otherwise mentioned in H&P    PHYSICAL EXAM: VS:  BP 114/74 (BP Location: Right Arm, Patient Position: Supine, Cuff Size: Normal)   Pulse 63   Temp (!) 97.5 F (36.4 C) (Oral)   Ht 6' (1.829 m)   Wt 228 lb 9.6 oz (103.7 kg)   BMI 31.00 kg/m  , BMI Body mass index is 31 kg/m. GEN: Well  nourished, well developed, in no acute distress HEENT: normal Neck: no JVD, carotid bruits, or masses Cardiac: IRRR; no murmurs, rubs, or gallops,no edema  Respiratory:  Clear to auscultation bilaterally, normal work of breathing GI: soft, nontender, nondistended, + BS MS: no deformity or atrophy. Well healed subxyphoid incision.   Skin: warm and dry, no rash Neuro:  Strength and sensation are intact Psych: euthymic mood, full affect   EKG:  Atrial fibrillation rate of 62 bpm. T-wave inversion antero/lateral leads.   Recent Labs: 12/20/2018: B Natriuretic Peptide 284.6; TSH 1.785 12/23/2018: ALT 17 12/24/2018: BUN 29; Creatinine, Ser 1.25; Hemoglobin 14.2; Platelets 216; Potassium 3.8; Sodium 137    Lipid Panel    Component Value Date/Time   CHOL 107 12/21/2018 0348   TRIG 49 12/21/2018 0348   HDL 49 12/21/2018 0348   CHOLHDL 2.2 12/21/2018 0348   VLDL 10 12/21/2018 0348   LDLCALC 48 12/21/2018 0348      Wt Readings from Last 3 Encounters:  01/03/19 228 lb 9.6 oz (103.7 kg)  12/24/18 226 lb 10.1 oz (102.8 kg)  07/27/18 237 lb (107.5 kg)      Other studies Reviewed: CT 12/23/2018 IMPRESSION: 1. Moderate volume of high attenuation pericardial fluid, likely reflective of an acute pericarditis. This likely accounts for the patient's history of chest pain. 2. No acute abnormality of the thoracoabdominal aorta. Specifically, no findings to suggest an acute aortic syndrome. 3. Aortic atherosclerosis, in addition to left main and 3 vessel coronary artery disease. Assessment for potential risk factor modification, dietary therapy or pharmacologic therapy may be warranted, if clinically indicated. 4. Colonic diverticulosis without evidence of acute diverticulitis at this time. 5. Additional incidental findings, as above.   Echocardiogram 01/01/2019 1. The left ventricle has normal systolic function with an ejection fraction of 60-65%. The cavity size was normal. Left ventricular  diastolic Doppler parameters are indeterminate secondary to atrial fibrillation. 2. The right ventricle has normal systolic function. The cavity was normal. There is no increase in right ventricular wall thickness. 3. Large pleural effusion. 4. There is excessive respiratory variation in the tricuspid valve spectral Doppler velocities. 5. Large pericardial effusion measuring up to 2.5 cm anteriorly. There appears to be a large thrombus anterior to RV. Cannot rule out a prominent fat pad. However, there is free-flowing fluid both anterior and posterior, making thrombus more likely.  There is no evidence of RA or RV diastolic collapse, but the IVC is dilated and does not collapse. There is significant tricuspid valve inflow variation. Mitral inflow pattern is unremarkable. Findings concerning for early tamponade. 6. The mitral valve is normal in structure. 7. The tricuspid valve is normal in structure. 8. The aortic valve was  not well visualized. 9. The pulmonic valve was normal in structure. 10. The inferior vena cava was dilated in size with <50% respiratory variability  ASSESSMENT AND PLAN:  1. Pericardial Effusion: He is s/p pericardial window by Dr. Nils Pyle. He is recovering very well and is without complaint. He has some mild soreness but otherwise is back to normal activity. He is on colchicine. I will give him one refill and have advised him to talk with CVTS provider about continuing this medication.   2. Atrial fib: He is rate controlled on the metoprolol XL 25 mg daily. I will begin Eliquis 5 mg BID after reviewing hospital notes from Dr. Percival Spanish and Dr. Nils Pyle. CHADS VASC 4 with 8.5% risk for CVA.Marland Kitchen  He is given samples and a coupon for assistance with getting the Rx.   3. Hyperlipidemia: Continue atorvastatin 20 mg daily.   Current medicines are reviewed at length with the patient today.    Labs/ tests ordered today include: BMET   Phill Myron. West Pugh, ANP,  [facility]   01/03/2019 11:32 AM    [facility] [address] Office [phone] Fax [phone]

## 2019-01-03 NOTE — Patient Instructions (Addendum)
Medication Instructions: START ELIQUIS 5MG  TWICE DAILY If you need a refill on your cardiac medications before your next appointment, please call your pharmacy.  Labwork: BMET IN 2 WEEKS HERE IN OUR OFFICE AT [facility]  You will NOT need to fast   Take the provided lab slips with you to the lab for your blood draw.   When you have your labs (blood work) drawn today and your tests are completely normal, you will receive your results only by MyChart Message (if you have MyChart) -OR-  A paper copy in the mail.  If you have any lab test that is abnormal or we need to change your treatment, we will call you to review these results.  Follow-Up: You will need a follow up appointment in 3 months. You may see Minus Breeding, MD or one of the following Advanced Practice Providers on your designated Care Team:  Jory Sims, DNP, AACC  Rosaria Ferries, PA-C  At [facility], you and your health needs are our priority.  As part of our continuing mission to provide you with exceptional heart care, we have created designated Provider Care Teams.  These Care Teams include your primary Cardiologist (physician) and Advanced Practice Providers (APPs -  Physician Assistants and Nurse Practitioners) who all work together to provide you with the care you need, when you need it.  Thank you for choosing [facility] at [facility]!!

## 2019-01-09 ENCOUNTER — Telehealth: Payer: Self-pay | Admitting: Adult Health

## 2019-01-09 NOTE — Telephone Encounter (Signed)
New message    Patient calling to report flu- like symptoms and weakness, burning in chest. Patient denied SOB, denies chest pain

## 2019-01-09 NOTE — Telephone Encounter (Signed)
Pt was contacted, he was coughing, stated he feels weak x 3 days plus sweating but has not checked temp, thought maybe Dr Percival Spanish could give him a shot or something. Pt was told to contact PCP for advice, pt agreed to advice and verbalized understanding.

## 2019-01-11 ENCOUNTER — Telehealth: Payer: Self-pay | Admitting: Cardiology

## 2019-01-11 NOTE — Telephone Encounter (Signed)
New Message:    Pt wants to know if he should still come in for his labwork tomorrow?

## 2019-01-12 LAB — BASIC METABOLIC PANEL
BUN / CREAT RATIO: 16 (ref 10–24)
BUN: 20 mg/dL (ref 8–27)
CO2: 22 mmol/L (ref 20–29)
Calcium: 9 mg/dL (ref 8.6–10.2)
Chloride: 97 mmol/L (ref 96–106)
Creatinine, Ser: 1.25 mg/dL (ref 0.76–1.27)
GFR calc Af Amer: 63 mL/min/{1.73_m2} (ref >59)
GFR calc non Af Amer: 54 mL/min/{1.73_m2} — ABNORMAL LOW (ref >59)
Glucose: 181 mg/dL — ABNORMAL HIGH (ref 65–99)
Potassium: 5.3 mmol/L — ABNORMAL HIGH (ref 3.5–5.2)
SODIUM: 138 mmol/L (ref 134–144)

## 2019-01-12 NOTE — Telephone Encounter (Signed)
Pt came in and get his blood work done

## 2019-01-18 ENCOUNTER — Encounter: Payer: Self-pay | Admitting: Cardiothoracic Surgery

## 2019-01-18 ENCOUNTER — Ambulatory Visit: Payer: Medicare HMO | Admitting: Cardiothoracic Surgery

## 2019-01-18 ENCOUNTER — Telehealth: Payer: Self-pay | Admitting: Cardiothoracic Surgery

## 2019-01-18 NOTE — Telephone Encounter (Signed)
I called the patient for postop check 1 month after subxiphoid pericardial window.  Patient was at home phone #[phone].  I called the patient from my office [phone].  The patient had a diagnosis of probable viral pericarditis.  He was discharged home 3 to 4 days after subxiphoid pericardial window.  His symptoms have resolved.  The surgical incisions have healed.  He still has a chest tube stitch at the pericardial drain site.  He was directed to return to the office for a brief visit to remove the suture tomorrow.  Pathology and culture of the pericardial fluid and pericardial tissue were negative.  Patient is not taking any pain medication.  I reviewed updated instructions to the patient. He may drive and carry on normal activity. He may shower or bathe with regular soap and water in the incisions. He should stop taking the colchicine 0.6 mg twice daily after the prescription runs out. He should continue his other preoperative medications.  The patient will be scheduled to return for an office visit with chest x-ray in approximately 6 weeks.

## 2019-01-19 ENCOUNTER — Other Ambulatory Visit: Payer: Self-pay | Admitting: *Deleted

## 2019-01-19 ENCOUNTER — Ambulatory Visit (INDEPENDENT_AMBULATORY_CARE_PROVIDER_SITE_OTHER): Payer: Self-pay | Admitting: *Deleted

## 2019-01-19 ENCOUNTER — Other Ambulatory Visit: Payer: Self-pay

## 2019-01-19 DIAGNOSIS — I3139 Other pericardial effusion (noninflammatory): Secondary | ICD-10-CM

## 2019-01-19 DIAGNOSIS — Z4802 Encounter for removal of sutures: Secondary | ICD-10-CM

## 2019-01-19 DIAGNOSIS — Z9889 Other specified postprocedural states: Secondary | ICD-10-CM

## 2019-01-19 DIAGNOSIS — I313 Pericardial effusion (noninflammatory): Secondary | ICD-10-CM

## 2019-01-19 LAB — FUNGUS CULTURE WITH STAIN

## 2019-01-19 LAB — FUNGUS CULTURE RESULT

## 2019-01-19 LAB — FUNGAL ORGANISM REFLEX

## 2019-01-19 NOTE — Progress Notes (Signed)
Ian Estes had a pericardial window on 12/21/18.  He presents for suture removal of one previous chest tube site.  This was easily removed and the site is well healed.  He will return as scheduled with a CXR.

## 2019-01-23 ENCOUNTER — Telehealth: Payer: Self-pay | Admitting: Cardiology

## 2019-01-23 NOTE — Telephone Encounter (Signed)
Due to no DPR on file, requested to speak directly with patient.  Patient reports feeling weak for the past 2 &1/2 weeks, and also reports a burning sensation in chest rated 3/10 since starting eliquis. Reports decreased appetite for the past week. Patient reports eating and drinking an adequate amount.  Denies sob, or dizziness, coughing, fever, chills, n/v, headache, sore throat. Patient was recently placed on levaquin at hospital d/c but says he didn't finish the supply because it caused him to have diarrhea. Reports not having a way to check BP and HR at home. Medications reconciled.  Advised that eliquis doesn't cause the symptoms he is describing. Advised that he should contact his PCP with the above symptoms and a message would sent to his provider. Advised if symptoms got worse, to go to the ED for an evaluation. Verbalized understanding of plan.

## 2019-01-23 NOTE — Telephone Encounter (Signed)
Wife of pt called. Pt was put on Eliquis when he was in the hospital last month. He is extremely lethargic, to the point where he can not get out of bed. He also does not have an appetite. He just doesn't feel well at all. The wife was wondering if this is a side effect of the medication, and what she can do to help her husband.

## 2019-01-25 NOTE — Telephone Encounter (Signed)
Agree with suggestions 

## 2019-02-02 LAB — ACID FAST CULTURE WITH REFLEXED SENSITIVITIES (MYCOBACTERIA)
Acid Fast Culture: NEGATIVE
Acid Fast Culture: NEGATIVE

## 2019-02-21 ENCOUNTER — Other Ambulatory Visit: Payer: Self-pay

## 2019-02-22 ENCOUNTER — Encounter: Payer: Self-pay | Admitting: Cardiothoracic Surgery

## 2019-02-22 ENCOUNTER — Ambulatory Visit (INDEPENDENT_AMBULATORY_CARE_PROVIDER_SITE_OTHER): Payer: Self-pay | Admitting: Cardiothoracic Surgery

## 2019-02-22 ENCOUNTER — Ambulatory Visit
Admission: RE | Admit: 2019-02-22 | Discharge: 2019-02-22 | Disposition: A | Discharge status: Home / Self Care | Payer: Medicare HMO | Source: Ambulatory Visit | Attending: Cardiothoracic Surgery | Admitting: Cardiothoracic Surgery

## 2019-02-22 VITALS — BP 118/70 | HR 95 | Temp 97.3°F | Resp 16 | Ht 72.0 in | Wt 216.0 lb

## 2019-02-22 DIAGNOSIS — I313 Pericardial effusion (noninflammatory): Secondary | ICD-10-CM

## 2019-02-22 DIAGNOSIS — I3139 Other pericardial effusion (noninflammatory): Secondary | ICD-10-CM

## 2019-02-22 DIAGNOSIS — Z9889 Other specified postprocedural states: Secondary | ICD-10-CM

## 2019-02-22 NOTE — Progress Notes (Signed)
PCP is Aletha Halim., PA-C Referring Provider is Aletha Halim., PA-C  Chief Complaint  Patient presents with  . 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  Culture and pathology negative.  Patient was in atrial fibrillation of probable new onset upon presentation and has remained in atrial fibrillation.  This has been controlled heart rate.  He was started on Eliquis at his cardiology post hospital visit.  This made him feel fatigued and with diffuse soreness and he stopped the medicine.  He is currently in atrial fibrillation.  He has an appointment to be seen by cardiology again in about 4 weeks. I have also asked him to stop taking his Klonopin and his colchicine because he is still very fatigued and not feeling well. Chest x-ray is clear and there is no evidence of edema or pleural effusion. Surgical incision is well-healed  Past Medical History:  Diagnosis Date  . Arthritis    "all my joints" (06/13/2014)  . 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 Gout   . High cholesterol   . Hypertension   . Obesity   . Pneumonia 2014 X 1  . Prostate cancer ([place])   . Type II diabetes mellitus ([place])     Past Surgical History:  Procedure Laterality Date  . INCISION AND DRAINAGE PERIRECTAL ABSCESS  1990's   "hemorrhoid"  . KNEE ARTHROSCOPY Right ~ 2011  . PROSTATE SURGERY  1996  . SKIN CANCER EXCISION  ~ 2005 X 2   "left foot"  . SUBXYPHOID PERICARDIAL WINDOW N/A 12/21/2018   Procedure: SUBXYPHOID PERICARDIAL WINDOW;  Surgeon: Ivin Poot, MD;  Location: [place];  Service: Thoracic;  Laterality: N/A;  . TEE WITHOUT CARDIOVERSION N/A 12/21/2018   Procedure: TRANSESOPHAGEAL ECHOCARDIOGRAM (TEE);  Surgeon: Prescott Gum, Collier Salina, MD;  Location: [facility] OR;  Service: Thoracic;  Laterality: N/A;     Family History  Problem Relation Age of Onset  . CAD Neg Hx     Social History Social History   Tobacco Use  . Smoking status: Former Smoker    Packs/day: 1.50    Years: 32.00    Pack years: 48.00    Types: Cigarettes    Last attempt to quit: 08/20/1987    Years since quitting: 31.5  . Smokeless tobacco: Never Used  Substance Use Topics  . Alcohol use: Yes    Alcohol/week: 21.0 standard drinks    Types: 21 Cans of beer per week    Comment: 3 beers a day, no liquor in 5 years  . Drug use: No    Current Outpatient Medications  Medication Sig Dispense Refill  . allopurinol (ZYLOPRIM) 100 MG tablet Take 100 mg by mouth daily.    Marland Kitchen atorvastatin (LIPITOR) 20 MG tablet Take 20 mg by mouth daily.    . clonazePAM (KLONOPIN) 0.5 MG tablet Take 0.5 mg by mouth at bedtime as needed (sleep).    . colchicine 0.6 MG tablet Take 1 tablet (0.6 mg total) by mouth 2 (two) times daily. 60 tablet 0  . folic acid (FOLVITE) 1 MG tablet Take 1 tablet (1 mg total) by mouth daily. 30 tablet 1  . metoprolol succinate (TOPROL XL) 25 MG 24 hr tablet Take 1 tablet (25 mg total) by mouth daily. 30 tablet 3  .  apixaban (ELIQUIS) 5 MG TABS tablet Take 1 tablet (5 mg total) by mouth 2 (two) times daily. (Patient not taking: Reported on 02/22/2019) 60 tablet 11   No current facility-administered medications for this visit.     No Known Allergies  Review of Systems  Appetite not normal Fatigue and some dyspnea with exertion No edema No chest pain No problems with surgical incision  BP 118/70 (BP Location: Right Arm, Patient Position: Sitting, Cuff Size: Large)   Pulse 95   Temp (!) 97.3 F (36.3 C) (Skin)   Resp 16   Ht 6' (1.829 m)   Wt 216 lb (98 kg)   SpO2 97% Comment: ON RA  BMI 29.29 kg/m  Physical Exam Irregular heart rhythm without murmur or gallop Lungs clear Surgical incision clean and dry well-healed No pedal edema  Diagnostic Tests: Chest x-ray images taken today personally  reviewed showing mild cardiomegaly, low lung volumes but no significant pleural effusion or edema  Impression: Patient is recovered from subxiphoid pericardial window, drainage of pericardial effusion.  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  He has been in atrial fibrillation since presentation over 2 months ago.  He stopped his Eliquis because of perceived side effects.  He is not willing to resume the medication and and wishes to discuss an alternative anticoagulation strategy.  He will start taking aspirin daily and keep his appointment to be seen in person, face-to-face, by cardiology.  Plan: Patient has activity restrictions lifted following surgery. He will follow the above changes in medications and start aspirin daily.  He will return here as needed.  Appointment is scheduled for cardiology in early June.  Len Childs, MD [facility] [phone]

## 2019-03-01 ENCOUNTER — Ambulatory Visit: Payer: Medicare HMO | Admitting: Cardiothoracic Surgery

## 2019-03-15 ENCOUNTER — Telehealth: Payer: Self-pay | Admitting: Cardiology

## 2019-03-15 NOTE — Telephone Encounter (Signed)
MyChart msg sent.

## 2019-03-22 ENCOUNTER — Ambulatory Visit: Payer: Medicare HMO | Admitting: Cardiothoracic Surgery

## 2019-03-24 ENCOUNTER — Telehealth: Payer: Self-pay | Admitting: Cardiology

## 2019-03-24 NOTE — Telephone Encounter (Signed)
Patient is open to Video or tele-visit/smartphone/ consent/ my chart/ pre reg completed

## 2019-03-29 NOTE — Progress Notes (Signed)
Virtual Visit via Telephone Note   This visit type was conducted due to national recommendations for restrictions regarding the COVID-19 Pandemic (e.g. social distancing) in an effort to limit this patient's exposure and mitigate transmission in our community.  Due to his co-morbid illnesses, this patient is at least at moderate risk for complications without adequate follow up.  This format is felt to be most appropriate for this patient at this time.  The patient did not have access to video technology/had technical difficulties with video requiring transitioning to audio format only (telephone).  All issues noted in this document were discussed and addressed.  No physical exam could be performed with this format.  Please refer to the patient's chart for his  consent to telehealth for [name].   Date:  03/30/2019   ID:  Ian Estes, DOB 01-Feb-1941, MRN [id number]  Patient Location: Home Provider Location: Home PCP:  Aletha Halim., PA-C  Cardiologist:  Minus Breeding, MD  Electrophysiologist:  None   Evaluation Performed:  Follow-Up Visit  Chief Complaint:   Fatigue  History of Present Illness:    Ian Estes is a 78 y.o. male who presents for evaluation post pericardial window for effusion and atrial fib.    The etiology of this was not clear.  I did check cultures and the pathology.  There was no evidence of infection or malignancy.  He was treated with colchicine but this was stopped when he saw Dr. Prescott Gum.  He was very fatigued.  He wondered if it could be the medications and he stopped on his own taking Klonopin and colchicine.  He also requested to be taken off the Eliquis.  He says he feels better now.  He is less fatigued.  He is not having any of the chest burning that was his presenting complaint.  He denies chest pressure, neck or arm discomfort.  He has no shortness of breath, PND or orthopnea.  He has had no weight gain or edema.  He is not as active because  he is retired but is not having any severe limitations.  The patient does not have symptoms concerning for COVID-19 infection (fever, chills, cough, or new shortness of breath).    Past Medical History:  Diagnosis Date  . Arthritis    "all my joints" (06/13/2014)  . 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 Gout   . High cholesterol   . Hypertension   . Obesity   . Pneumonia 2014 X 1  . Prostate cancer ([place])   . Type II diabetes mellitus ([place])    Past Surgical History:  Procedure Laterality Date  . INCISION AND DRAINAGE PERIRECTAL ABSCESS  1990's   "hemorrhoid"  . KNEE ARTHROSCOPY Right ~ 2011  . PROSTATE SURGERY  1996  . SKIN CANCER EXCISION  ~ 2005 X 2   "left foot"  . SUBXYPHOID PERICARDIAL WINDOW N/A 12/21/2018   Procedure: SUBXYPHOID PERICARDIAL WINDOW;  Surgeon: Ivin Poot, MD;  Location: [place];  Service: Thoracic;  Laterality: N/A;  . TEE WITHOUT CARDIOVERSION N/A 12/21/2018   Procedure: TRANSESOPHAGEAL ECHOCARDIOGRAM (TEE);  Surgeon: Prescott Gum, Collier Salina, MD;  Location: [facility] OR;  Service: Thoracic;  Laterality: N/A;     Current Meds  Medication Sig  . allopurinol (ZYLOPRIM) 100 MG tablet Take 100 mg by mouth daily.  Marland Kitchen aspirin EC 81 MG tablet Take 81 mg by mouth daily.  Marland Kitchen atorvastatin (LIPITOR) 20 MG tablet  Take 20 mg by mouth daily.  . clonazePAM (KLONOPIN) 0.5 MG tablet Take 0.5 mg by mouth at bedtime as needed (sleep).  . folic acid (FOLVITE) 1 MG tablet Take 1 tablet (1 mg total) by mouth daily.  . metoprolol succinate (TOPROL XL) 25 MG 24 hr tablet Take 1 tablet (25 mg total) by mouth daily.     Allergies:   Patient has no known allergies.   Social History   Tobacco Use  . Smoking status: Former Smoker    Packs/day: 1.50    Years: 32.00    Pack years: 48.00    Types: Cigarettes    Quit date: 08/20/1987    Years since quitting: 31.6  . Smokeless tobacco: Never Used  Substance Use Topics  . Alcohol use: Yes    Alcohol/week:  21.0 standard drinks    Types: 21 Cans of beer per week    Comment: 3 beers a day, no liquor in 5 years  . Drug use: No     Family Hx: The patient's family history is negative for CAD.  ROS:   Please see the history of present illness.    As stated in the HPI and negative for all other systems.   Prior CV studies:   The following studies were reviewed today:  Hospital records, Dr. Prescott Gum notes  Labs/Other Tests and Data Reviewed:    EKG:  No ECG reviewed.  Recent Labs: 12/20/2018: B Natriuretic Peptide 284.6; TSH 1.785 12/23/2018: ALT 17 12/24/2018: Hemoglobin 14.2; Platelets 216 01/12/2019: BUN 20; Creatinine, Ser 1.25; Potassium 5.3; Sodium 138   Recent Lipid Panel Lab Results  Component Value Date/Time   CHOL 107 12/21/2018 03:48 AM   TRIG 49 12/21/2018 03:48 AM   HDL 49 12/21/2018 03:48 AM   CHOLHDL 2.2 12/21/2018 03:48 AM   LDLCALC 48 12/21/2018 03:48 AM    Wt Readings from Last 3 Encounters:  03/30/19 218 lb 9.6 oz (99.2 kg)  02/22/19 216 lb (98 kg)  01/03/19 228 lb 9.6 oz (103.7 kg)     Objective:    Vital Signs:  Ht 6' (1.829 m)   Wt 218 lb 9.6 oz (99.2 kg)   BMI 29.65 kg/m     ASSESSMENT & PLAN:     Pericardial Effusion: He is s/p pericardial window by Dr. Nils Pyle.  Symptomatically he is improved.  I would not suspect by history that he is had any recurrence but I will probably follow this up with a repeat exam and possibly echo in the future.   Atrial fib:    He has not had any symptomatic paroxysms of this.  However, given this and his increased risk for stroke he and I have had a long discussion about resuming the Eliquis.  He was not having any problems with this other than what he thought was fatigue that might have been related.  I think this is less likely than fatigue from the Klonopin or possibly even the colchicine.  Therefore, I will resume his Eliquis.  We will get a basic metabolic profile in 1 month. the Rx.   Hyperlipidemia:   I will  defer to his primary provider.    Time:   Today, I have spent 16 minutes with the patient with telehealth technology discussing the above problems.     Medication Adjustments/Labs and Tests Ordered: Current medicines are reviewed at length with the patient today.  Concerns regarding medicines are outlined above.   Tests Ordered: No orders of the  defined types were placed in this encounter.   Medication Changes: No orders of the defined types were placed in this encounter.   Disposition:  Follow up with me in six months  Signed, Minus Breeding, MD  03/30/2019 10:09 AM    [place]

## 2019-03-30 ENCOUNTER — Encounter: Payer: Self-pay | Admitting: Cardiology

## 2019-03-30 ENCOUNTER — Telehealth (INDEPENDENT_AMBULATORY_CARE_PROVIDER_SITE_OTHER): Payer: Medicare HMO | Admitting: Cardiology

## 2019-03-30 VITALS — Ht 72.0 in | Wt 218.6 lb

## 2019-03-30 DIAGNOSIS — I480 Paroxysmal atrial fibrillation: Secondary | ICD-10-CM

## 2019-03-30 DIAGNOSIS — I309 Acute pericarditis, unspecified: Secondary | ICD-10-CM

## 2019-03-30 DIAGNOSIS — U071 COVID-19: Secondary | ICD-10-CM

## 2019-03-30 DIAGNOSIS — E785 Hyperlipidemia, unspecified: Secondary | ICD-10-CM

## 2019-03-30 MED ORDER — APIXABAN 5 MG PO TABS: 5.0000 mg | ORAL_TABLET | Freq: Two times a day (BID) | ORAL | 11 refills | Status: DC

## 2019-03-30 NOTE — Patient Instructions (Signed)
Medication Instructions:  START- Eliquis 5 mg twice a day  If you need a refill on your cardiac medications before your next appointment, please call your pharmacy.  Labwork: None Ordered   Testing/Procedures: None Ordered  Follow-Up: You will need a follow up appointment in 6 months.  Please call our office 2 months in advance to schedule this appointment.  You may see Minus Breeding, MD or one of the following Advanced Practice Providers on your designated Care Team:   Rosaria Ferries, PA-C . Jory Sims, DNP, ANP     At [facility], you and your health needs are our priority.  As part of our continuing mission to provide you with exceptional heart care, we have created designated Provider Care Teams.  These Care Teams include your primary Cardiologist (physician) and Advanced Practice Providers (APPs -  Physician Assistants and Nurse Practitioners) who all work together to provide you with the care you need, when you need it.  Thank you for choosing [facility] at [facility]!!

## 2019-06-13 ENCOUNTER — Other Ambulatory Visit: Payer: Self-pay | Admitting: Adult Health

## 2019-10-07 ENCOUNTER — Other Ambulatory Visit: Payer: Self-pay | Admitting: Adult Health

## 2020-01-03 IMAGING — DX DG CHEST 2V
2 series · 2 of 2 positions shown · non-contrast
Comparison: Yesterday.

CLINICAL DATA: Status post pericardial window creation on
12/21/2018. No current chest complaints.

EXAM:
CHEST - 2 VIEW

[w chest pa]
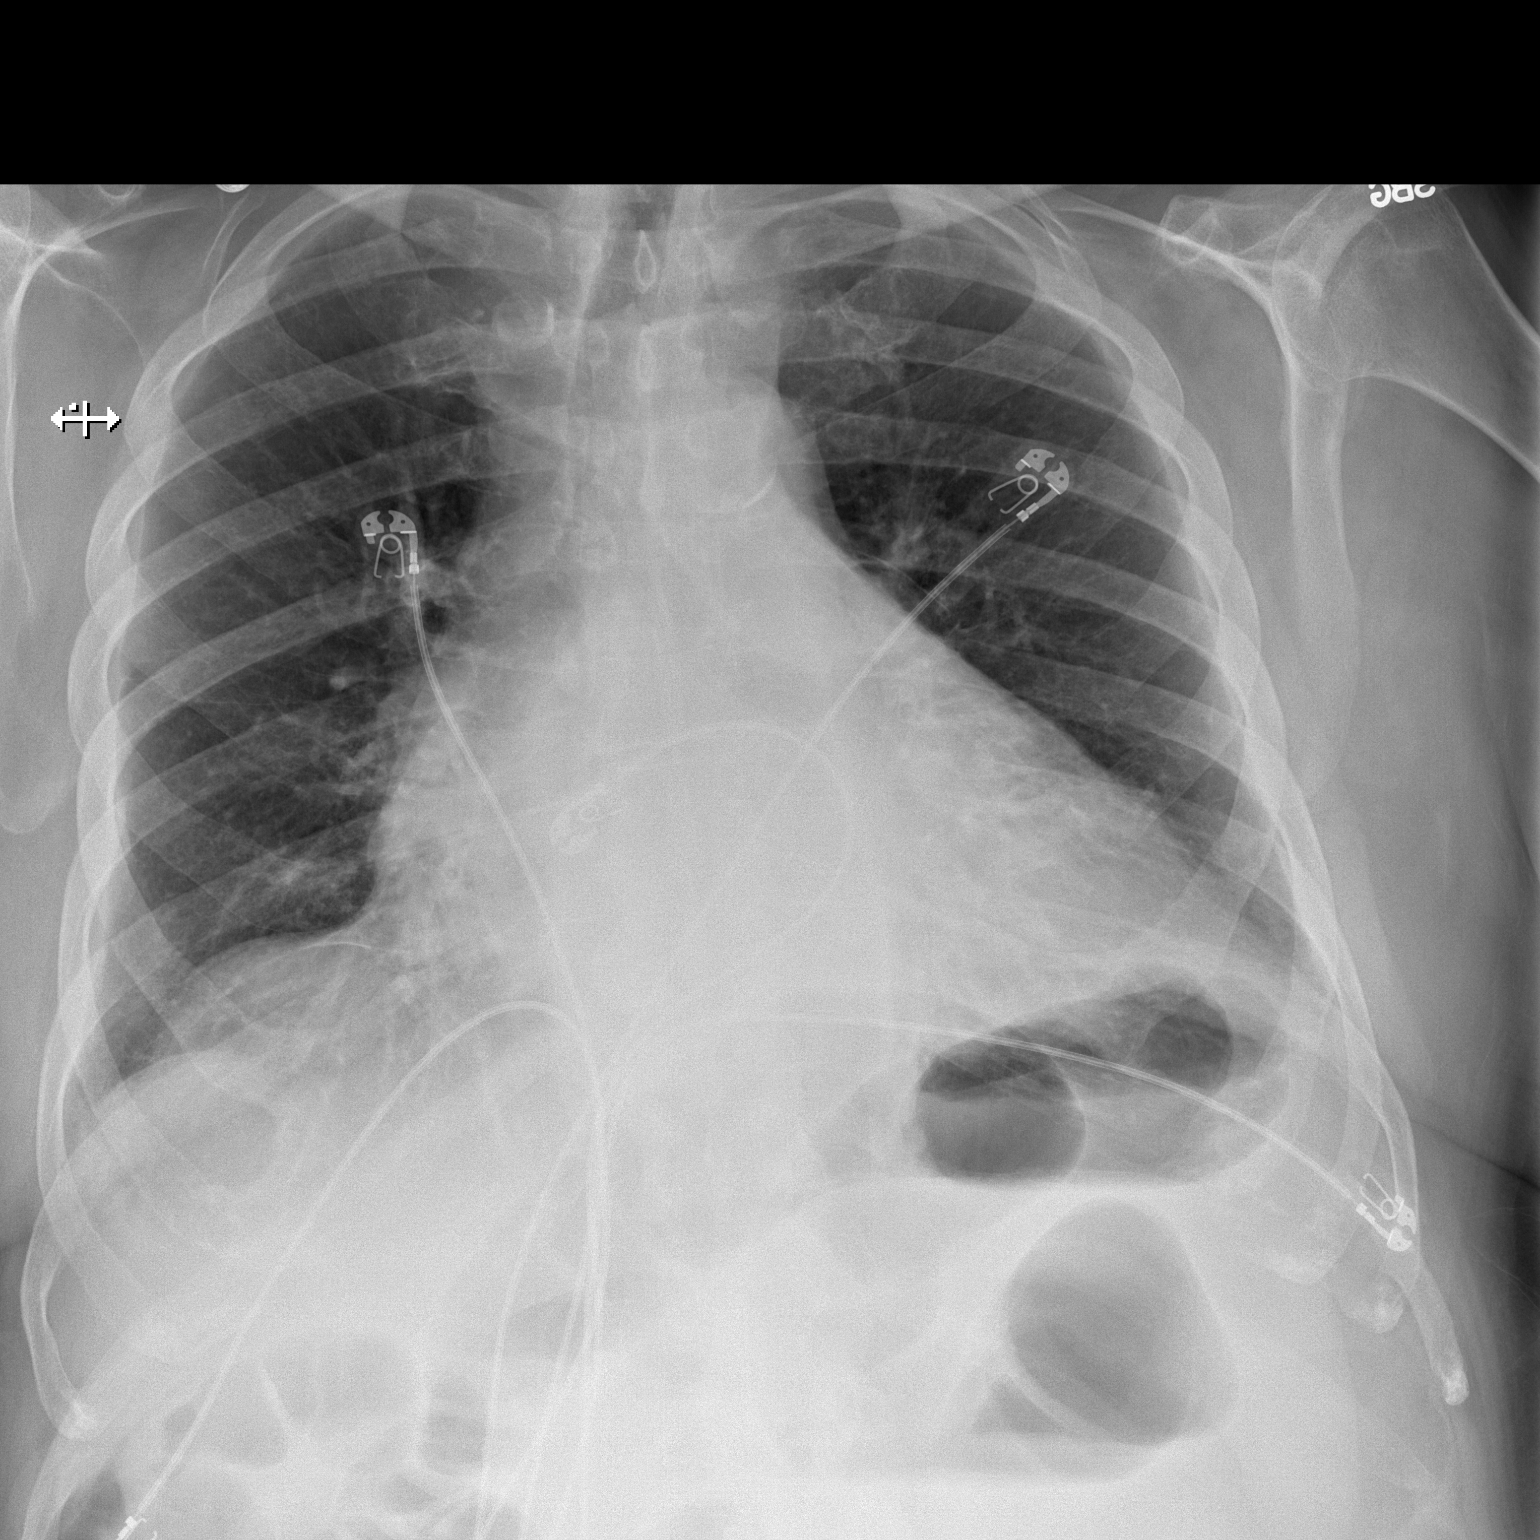

[w chest lat]
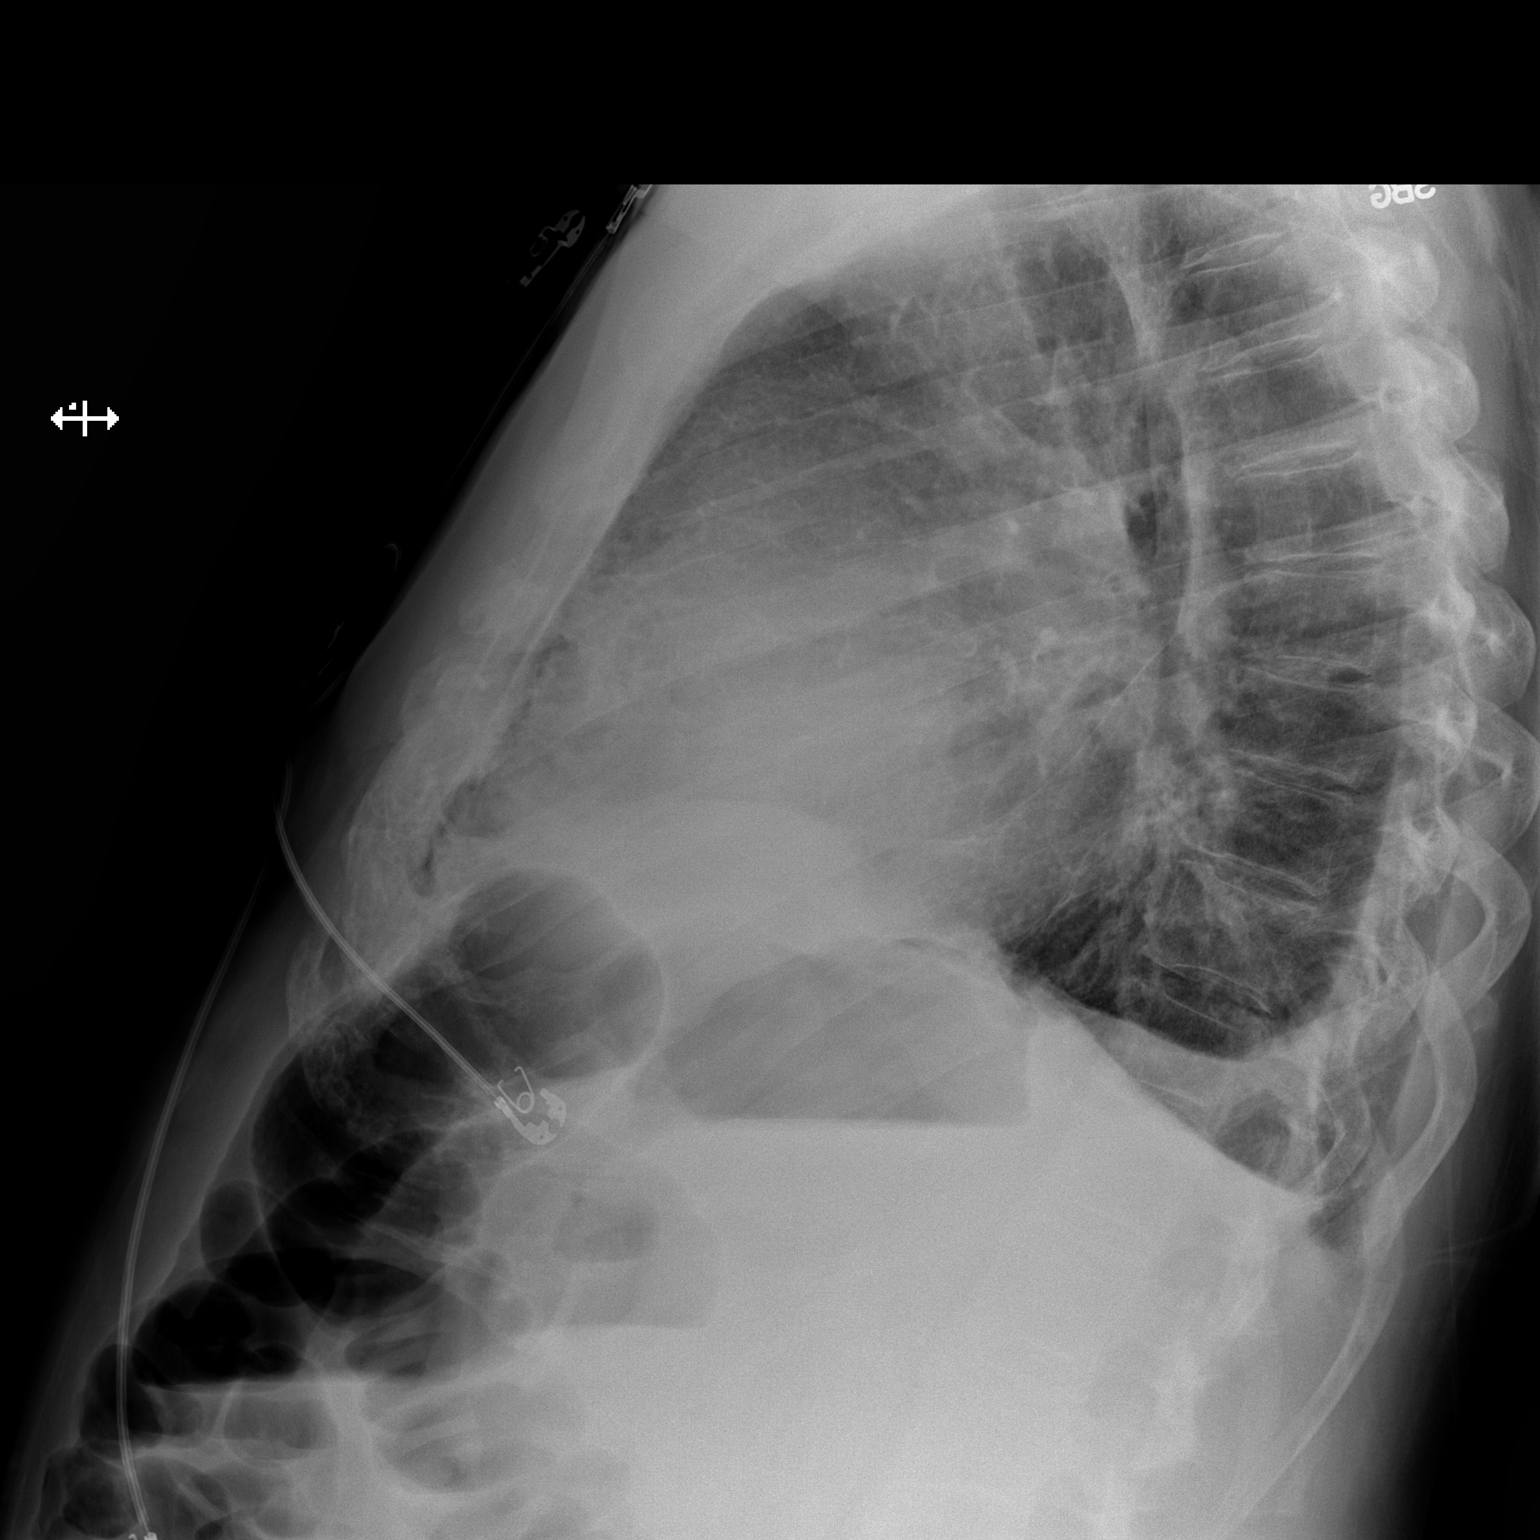

[2 of 2 positions shown; findings below may reference images not displayed]

FINDINGS: Stable enlarged cardiac silhouette. Clear lungs with normal
vascularity. Mild peribronchial thickening. Probable small bilateral
posterior pleural effusions. Minimal thoracic spine degenerative
changes.
IMPRESSION: 1. Probable small bilateral posterior pleural effusions.
2. Resolved bibasilar atelectasis.
3. Stable cardiomegaly.
4. Mild bronchitic changes.

## 2020-01-04 ENCOUNTER — Encounter: Payer: Self-pay | Admitting: Cardiology

## 2020-04-04 ENCOUNTER — Other Ambulatory Visit: Payer: Self-pay | Admitting: Cardiology

## 2020-06-06 ENCOUNTER — Other Ambulatory Visit: Payer: Self-pay | Admitting: Adult Health

## 2020-06-10 DIAGNOSIS — Z7189 Other specified counseling: Secondary | ICD-10-CM | POA: Insufficient documentation

## 2020-06-10 NOTE — Progress Notes (Signed)
Cardiology Office Note   Date:  06/11/2020   ID:  Mando, Blatz 1941/04/12, MRN [id number]  PCP:  Aletha Halim., PA-C  Cardiologist:   Minus Breeding, MD   Chief Complaint  Patient presents with  . Atrial Fibrillation      History of Present Illness: Ian Estes is a 79 y.o. male who presents for evaluation post pericardial window for effusion and atrial fib.    The etiology of this was not clear. There was no evidence of infection or malignancy.  He was treated with colchicine.    Since I last saw him he has done well from a cardiovascular standpoint.  He does not feel atrial fibrillation.  He has no palpitations, presyncope or syncope.  He denies any shortness of breath or any of the symptoms that were associated with his previous pericardial effusion.  He right now is suffering from gout in his left great toe.  He is having this managed.    Of note he is in fibrillation today but does not feel this.  He tolerates anticoagulation.  He denies any chest pressure, neck or arm discomfort.   Past Medical History:  Diagnosis Date  . Arthritis    "all my joints" (06/13/2014)  . 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 Gout   . High cholesterol   . Hypertension   . Obesity   . Pneumonia 2014 X 1  . Prostate cancer ([place])   . Type II diabetes mellitus ([place])     Past Surgical History:  Procedure Laterality Date  . INCISION AND DRAINAGE PERIRECTAL ABSCESS  1990's   "hemorrhoid"  . KNEE ARTHROSCOPY Right ~ 2011  . PROSTATE SURGERY  1996  . SKIN CANCER EXCISION  ~ 2005 X 2   "left foot"  . SUBXYPHOID PERICARDIAL WINDOW N/A 12/21/2018   Procedure: SUBXYPHOID PERICARDIAL WINDOW;  Surgeon: Ivin Poot, MD;  Location: [place];  Service: Thoracic;  Laterality: N/A;  . TEE WITHOUT CARDIOVERSION N/A 12/21/2018   Procedure: TRANSESOPHAGEAL ECHOCARDIOGRAM (TEE);  Surgeon: Prescott Gum, Collier Salina, MD;  Location: [facility] OR;  Service: Thoracic;   Laterality: N/A;     Current Outpatient Medications  Medication Sig Dispense Refill  . allopurinol (ZYLOPRIM) 100 MG tablet Take 100 mg by mouth daily.    Marland Kitchen amLODipine (NORVASC) 5 MG tablet Take 5 mg by mouth daily.    Marland Kitchen atorvastatin (LIPITOR) 20 MG tablet Take 20 mg by mouth daily.    . cephALEXin (KEFLEX) 500 MG capsule Take 500 mg by mouth 3 (three) times daily.    . clonazePAM (KLONOPIN) 0.5 MG tablet Take 0.5 mg by mouth at bedtime as needed (sleep).    Marland Kitchen ELIQUIS 5 MG TABS tablet TAKE 1 TABLET BY MOUTH TWICE A DAY 875 tablet 0  . folic acid (FOLVITE) 1 MG tablet Take 1 tablet (1 mg total) by mouth daily. 30 tablet 1  . metoprolol succinate (TOPROL-XL) 25 MG 24 hr tablet TAKE 1 TABLET BY MOUTH EVERY DAY 90 tablet 1   No current facility-administered medications for this visit.    Allergies:   Patient has no known allergies.    ROS:  Please see the history of present illness.   Otherwise, review of systems are positive for none.   All other systems are reviewed and negative.    PHYSICAL EXAM: VS:  BP 126/60   Pulse (!) 47   Ht 6' (1.829 m)  Wt 235 lb 12.8 oz (107 kg)   SpO2 97%   BMI 31.98 kg/m  , BMI Body mass index is 31.98 kg/m. GENERAL:  Well appearing NECK:  No jugular venous distention, waveform within normal limits, carotid upstroke brisk and symmetric, no bruits, no thyromegaly LUNGS:  Clear to auscultation bilaterally CHEST:  Unremarkable HEART:  PMI not displaced or sustained,S1 and S2 within normal limits, no S3, no clicks, no rubs, no murmurs,irreguar  ABD:  Flat, positive bowel sounds normal in frequency in pitch, no bruits, no rebound, no guarding, no midline pulsatile mass, no hepatomegaly, no splenomegaly EXT:  2 plus pulses throughout, mild ankle edema, no cyanosis no clubbing  EKG:  EKG is ordered today. The ekg ordered today demonstrates atrial fibrillation, rate 47, axis within normal limits, intervals within normal limits, no acute ST-T wave  changes.   Recent Labs: No results found for requested labs within last 8760 hours.    Lipid Panel    Component Value Date/Time   CHOL 107 12/21/2018 0348   TRIG 49 12/21/2018 0348   HDL 49 12/21/2018 0348   CHOLHDL 2.2 12/21/2018 0348   VLDL 10 12/21/2018 0348   LDLCALC 48 12/21/2018 0348      Wt Readings from Last 3 Encounters:  06/11/20 235 lb 12.8 oz (107 kg)  03/30/19 218 lb 9.6 oz (99.2 kg)  02/22/19 216 lb (98 kg)      Other studies Reviewed: Additional studies/ records that were reviewed today include: Labs. Review of the above records demonstrates:  Please see elsewhere in the note.     ASSESSMENT AND PLAN:  Pericardial Effusion:   He has no symptomatic suggestion that he has recurrence of this.  He had a successful window although there was no post procedure echo I do not think one is necessary.   Atrial fib:     He tolerates this rhythm and does not even know that he is in atrial fibrillation.  He tolerates anticoagulation.  No change in therapy.  He is up-to-date with blood work.  He is not anemic.  He is on the appropriate dose of anticoagulation for his renal function.  Covid education he has had his Covid vaccine.  Current medicines are reviewed at length with the patient today.  The patient does not have concerns regarding medicines.  The following changes have been made:  no change  Labs/ tests ordered today include: None  Orders Placed This Encounter  Procedures  . EKG 12-Lead     Disposition:   FU with me in one year.     Signed, Minus Breeding, MD  06/11/2020 9:18 AM    [facility]

## 2020-06-11 ENCOUNTER — Ambulatory Visit: Payer: Medicare HMO | Admitting: Cardiology

## 2020-06-11 ENCOUNTER — Encounter: Payer: Self-pay | Admitting: Cardiology

## 2020-06-11 ENCOUNTER — Other Ambulatory Visit: Payer: Self-pay

## 2020-06-11 VITALS — BP 126/60 | HR 47 | Ht 72.0 in | Wt 235.8 lb

## 2020-06-11 DIAGNOSIS — Z7189 Other specified counseling: Secondary | ICD-10-CM | POA: Diagnosis not present

## 2020-06-11 DIAGNOSIS — I480 Paroxysmal atrial fibrillation: Secondary | ICD-10-CM | POA: Diagnosis not present

## 2020-06-11 DIAGNOSIS — I309 Acute pericarditis, unspecified: Secondary | ICD-10-CM

## 2020-06-11 DIAGNOSIS — E785 Hyperlipidemia, unspecified: Secondary | ICD-10-CM | POA: Diagnosis not present

## 2020-06-11 NOTE — Patient Instructions (Signed)

## 2020-06-28 ENCOUNTER — Ambulatory Visit: Payer: Medicare HMO | Admitting: Specialist

## 2020-06-29 ENCOUNTER — Other Ambulatory Visit: Payer: Self-pay | Admitting: Cardiology

## 2020-09-27 ENCOUNTER — Other Ambulatory Visit: Payer: Self-pay | Admitting: Cardiology

## 2020-09-27 NOTE — Telephone Encounter (Signed)
Prescription refill request for Eliquis received. Indication: Atrial Fibrillation Last office visit: 05/2020  Hochrein Scr: needs labs Age: 79 Weight:107 kg  Prescription refilled

## 2020-11-12 ENCOUNTER — Other Ambulatory Visit: Payer: Self-pay | Admitting: Adult Health

## 2020-11-18 ENCOUNTER — Other Ambulatory Visit: Payer: Self-pay | Admitting: Cardiology

## 2020-12-04 DIAGNOSIS — Z87891 Personal history of nicotine dependence: Secondary | ICD-10-CM | POA: Diagnosis not present

## 2020-12-04 DIAGNOSIS — I10 Essential (primary) hypertension: Secondary | ICD-10-CM | POA: Diagnosis not present

## 2020-12-04 DIAGNOSIS — R252 Cramp and spasm: Secondary | ICD-10-CM | POA: Diagnosis not present

## 2020-12-04 DIAGNOSIS — M1A09X0 Idiopathic chronic gout, multiple sites, without tophus (tophi): Secondary | ICD-10-CM | POA: Diagnosis not present

## 2020-12-04 DIAGNOSIS — F5102 Adjustment insomnia: Secondary | ICD-10-CM | POA: Diagnosis not present

## 2020-12-04 DIAGNOSIS — E7800 Pure hypercholesterolemia, unspecified: Secondary | ICD-10-CM | POA: Diagnosis not present

## 2020-12-04 DIAGNOSIS — I4891 Unspecified atrial fibrillation: Secondary | ICD-10-CM | POA: Diagnosis not present

## 2020-12-04 DIAGNOSIS — R739 Hyperglycemia, unspecified: Secondary | ICD-10-CM | POA: Diagnosis not present

## 2020-12-19 ENCOUNTER — Ambulatory Visit (INDEPENDENT_AMBULATORY_CARE_PROVIDER_SITE_OTHER): Payer: Medicare HMO | Admitting: Specialist

## 2020-12-19 ENCOUNTER — Other Ambulatory Visit: Payer: Self-pay

## 2020-12-19 ENCOUNTER — Ambulatory Visit: Payer: Self-pay

## 2020-12-19 ENCOUNTER — Encounter: Payer: Self-pay | Admitting: Specialist

## 2020-12-19 VITALS — BP 134/74 | HR 66 | Ht 72.0 in | Wt 242.0 lb

## 2020-12-19 DIAGNOSIS — M1711 Unilateral primary osteoarthritis, right knee: Secondary | ICD-10-CM

## 2020-12-19 DIAGNOSIS — M25562 Pain in left knee: Secondary | ICD-10-CM

## 2020-12-19 DIAGNOSIS — M1712 Unilateral primary osteoarthritis, left knee: Secondary | ICD-10-CM

## 2020-12-19 DIAGNOSIS — M25561 Pain in right knee: Secondary | ICD-10-CM

## 2020-12-19 MED ORDER — MELOXICAM 15 MG PO TABS: 15.0000 mg | ORAL_TABLET | Freq: Every day | ORAL | 3 refills | Status: AC

## 2020-12-19 NOTE — Progress Notes (Signed)
Office Visit Note   Patient: Ian Estes           Date of Birth: May 18, 1941           MRN: [id number] Visit Date: 12/19/2020              Requested by: Aletha Halim., PA-C [address]  [place] [address] PCP: Aletha Halim., PA-C   Assessment & Plan: Visit Diagnoses:  1. Pain in both knees, unspecified chronicity   2. Unilateral primary osteoarthritis, right knee   3. Unilateral primary osteoarthritis, left knee     Plan: Knee is suffering from osteoarthritis, only real proven treatments are Weight loss, NSIADs like meloxicam and exercise. Well padded shoes help. Ice the knee that is suffering from osteoarthritis, only real proven treatments are  Well padded shoes help. Ice the knee 2-3 times a day 15-20 mins at a time.3 times a day 15-20 mins at a time. Hot showers in the AM.  Injection with steroid may be of benefit. Hemp CBD capsules, amazon.com 5,000-7,000 mg per bottle, 60 capsules per bottle, take one capsule twice a day. Cane in the left hand to use with left leg weight bearing. Left foot has a hard corn and the toes show clawing, Follow-Up Instructions: No follow-ups on file.  Meloxican (mobic)Follow-Up Instructions: No follow-ups on file.   Orders:  Orders Placed This Encounter  Procedures  . XR Knee 1-2 Views Left  . XR Knee 1-2 Views Right   No orders of the defined types were placed in this encounter.     Procedures: No procedures performed   Clinical Data: No additional findings.   Subjective: Chief Complaint  Patient presents with  . Right Knee - Pain    Wants injection  . Left Knee - Pain    Wants injection    80 year old male with history of bilateral knee pain, pain that is present with stiffness in the AM and when getting started after sitting or standing. He avoids kneeling and squatting. He takes his time with stairs, no trouble with shoes or shoes but it is not as it used to be. Every 2-3 months takes a  tylenol, no motrin or alleve. No CBD. Has no pain at night and is able to sleep. 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 No heart of kidney issues. HTN, gout in the left foot.     Review of Systems  Constitutional: Negative.  Negative for activity change, appetite change, chills, diaphoresis, fatigue, fever and unexpected weight change.  HENT: Negative.  Negative for congestion, dental problem, drooling, ear discharge, ear pain, facial swelling, hearing loss, mouth sores, nosebleeds, postnasal drip, rhinorrhea, sinus pressure, sinus pain, sneezing, sore throat, tinnitus, trouble swallowing and voice change.   Eyes: Negative.  Negative for photophobia, pain, discharge, redness, itching and visual disturbance (change prescription recently.).  Respiratory: Negative for apnea, cough, choking, chest tightness, shortness of breath, wheezing and stridor.   Cardiovascular: Negative.  Negative for chest pain, palpitations and leg swelling.  Gastrointestinal: Negative.  Negative for abdominal distention, abdominal pain, anal bleeding, blood in stool, constipation, diarrhea, nausea, rectal pain and vomiting.  Endocrine: Negative.  Negative for cold intolerance, heat intolerance, polydipsia, polyphagia and polyuria.  Genitourinary: Negative.  Negative for difficulty urinating, dysuria, enuresis, flank pain, frequency, genital sores, hematuria and penile discharge.  Musculoskeletal: Negative.  Negative for arthralgias, back pain, gait problem, joint swelling,  myalgias, neck pain and neck stiffness.  Skin: Negative.  Negative for pallor, rash and wound.  Allergic/Immunologic: Negative for environmental allergies, food allergies and immunocompromised state.  Neurological: Negative for dizziness, tremors, seizures, syncope, facial asymmetry, speech difficulty, weakness, light-headedness, numbness and headaches.  Hematological: Negative.   Negative for adenopathy.  Psychiatric/Behavioral: Negative.  Negative for agitation, behavioral problems, confusion, decreased concentration, dysphoric mood, hallucinations, self-injury, sleep disturbance and suicidal ideas. The patient is not nervous/anxious and is not hyperactive.      Objective: Vital Signs: BP 134/74 (BP Location: Left Arm, Patient Position: Sitting)   Pulse 66   Ht 6' (1.829 m)   Wt 242 lb (109.8 kg)   BMI 32.82 kg/m   Physical Exam Constitutional:      Appearance: He is well-developed and well-nourished.  HENT:     Head: Normocephalic and atraumatic.  Eyes:     Extraocular Movements: EOM normal.     Pupils: Pupils are equal, round, and reactive to light.  Pulmonary:     Effort: Pulmonary effort is normal.     Breath sounds: Normal breath sounds.  Abdominal:     General: Bowel sounds are normal.     Palpations: Abdomen is soft.  Musculoskeletal:     Cervical back: Normal range of motion and neck supple.     Right knee: Effusion present.     Instability Tests: Medial McMurray test positive. Lateral McMurray test negative.     Left knee: Effusion present.     Instability Tests: Medial McMurray test negative and lateral McMurray test negative.  Skin:    General: Skin is warm and dry.  Neurological:     Mental Status: He is alert and oriented to person, place, and time.  Psychiatric:        Mood and Affect: Mood and affect normal.        Behavior: Behavior normal.        Thought Content: Thought content normal.        Judgment: Judgment normal.     Left Ankle Exam   Muscle Strength  Dorsiflexion:  5/5  Plantar flexion:  5/5  Anterior tibial:  5/5   Comments:  Left plantar MTP hard corn debrided. 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     Right Knee Exam   Muscle Strength  The patient has normal right knee strength.  Tenderness  The patient is experiencing tenderness in the medial joint line and medial  retinaculum.  Range of Motion  Extension:  -10 abnormal  Flexion: 130   Tests  McMurray:  Medial - positive Lateral - negative Varus: positive Valgus: negative Lachman:  Anterior - negative    Posterior - negative Drawer:  Anterior - negative    Posterior - negative Pivot shift: negative Patellar apprehension: negative  Other  Erythema: absent Scars: absent Effusion: effusion present   Left Knee Exam   Muscle Strength  The patient has normal left knee strength.  Tenderness  The patient is experiencing tenderness in the medial joint line and medial retinaculum.  Range of Motion  Extension:  -5 abnormal  Flexion: 130   Tests  McMurray:  Medial - negative Lateral - negative Varus: negative Valgus: negative Lachman:  Anterior - negative    Posterior - negative Drawer:  Anterior - negative     Posterior - negative Pivot shift: negative Patellar apprehension: negative  Other  Erythema: absent Scars: absent Effusion: effusion present  Specialty Comments:  No specialty comments available.  Imaging: No results found.   PMFS History: Patient Active Problem List   Diagnosis Date Noted  . Educated about COVID-19 virus infection 06/10/2020  . S/P pericardial window creation 12/21/2018  . Chest pain 12/20/2018  . Unspecified atrial fibrillation ([place]) 12/20/2018  . Pericardial effusion 12/20/2018  . Chronic abdominal pain 06/13/2014  . Hyperlipidemia 06/13/2014  . Acute renal failure ([place]) 06/13/2014  . Dehydration 06/13/2014  . Diabetes mellitus without complication ([place])   . Hypertension    Past Medical History:  Diagnosis Date  . Arthritis    "all my joints" (06/13/2014)  . Cancer [facility]) ~ 2005   "left foot; malignant; 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"  . Gout   . High cholesterol   . Hypertension   . Obesity   . Pneumonia 2014 X 1  . Prostate cancer ([place])   . Type II diabetes mellitus (H[facility])     Family History  Problem Relation Age of Onset  . CAD  Neg Hx     Past Surgical History:  Procedure Laterality Date  . INCISION AND DRAINAGE PERIRECTAL ABSCESS  1990's   "hemorrhoid"  . KNEE ARTHROSCOPY Right ~ 2011  . PROSTATE SURGERY  1996  . SKIN CANCER EXCISION  ~ 2005 X 2   "left foot"  . SUBXYPHOID PERICARDIAL WINDOW N/A 12/21/2018   Procedure: SUBXYPHOID PERICARDIAL WINDOW;  Surgeon: Ivin Poot, MD;  Location: [facility];  Service: Thoracic;  Laterality: N/A;  . TEE WITHOUT CARDIOVERSION N/A 12/21/2018   Procedure: TRANSESOPHAGEAL ECHOCARDIOGRAM (TEE);  Surgeon: Prescott Gum, Collier Salina, MD;  Location: [facility] OR;  Service: Thoracic;  Laterality: N/A;   Social History   Occupational History  . Occupation: retired  Tobacco Use  . Smoking status: Former Smoker    Packs/day: 1.50    Years: 32.00    Pack years: 48.00    Types: Cigarettes    Quit date: 08/20/1987    Years since quitting: 33.3  . Smokeless tobacco: Never Used  Vaping Use  . Vaping Use: Never used  Substance and Sexual Activity  . Alcohol use: Yes    Alcohol/week: 21.0 standard drinks    Types: 21 Cans of beer per week    Comment: 3 beers a day, no liquor in 5 years  . Drug use: No  . Sexual activity: Not Currently

## 2020-12-19 NOTE — Patient Instructions (Signed)
Plan: Knee is suffering from osteoarthritis, only real proven treatments are Weight loss, NSIADs like meloxicam and exercise. Well padded shoes help. Ice the knee that is suffering from osteoarthritis, only real proven treatments are  Well padded shoes help. Ice the knee 2-3 times a day 15-20 mins at a time.3 times a day 15-20 mins at a time. Hot showers in the AM.  Injection with steroid may be of benefit. Hemp CBD capsules, amazon.com 5,000-7,000 mg per bottle, 60 capsules per bottle, take one capsule twice a day. Cane in the left hand to use with left leg weight bearing. Left foot has a hard corn and the toes show clawing, Follow-Up Instructions: No follow-ups on file.  Meloxican (mobic)Follow-Up Instructions: No follow-ups on file.

## 2021-04-09 ENCOUNTER — Other Ambulatory Visit: Payer: Self-pay | Admitting: Cardiology

## 2021-04-10 NOTE — Telephone Encounter (Signed)
64m, 109.8kg, scr 1.24 05/22/20, lovw/hochrein 03/30/19 will send refill as he has an appt scheduled

## 2021-05-09 ENCOUNTER — Other Ambulatory Visit: Payer: Self-pay | Admitting: Cardiology

## 2021-06-05 NOTE — Progress Notes (Signed)
Cardiology Office Note   Date:  06/06/2021   ID:  Ian Estes, DOB 02/02/1941, MRN [id number]  PCP:  Aletha Halim., PA-C  Cardiologist:   Minus Breeding, MD   Chief Complaint  Patient presents with   Atrial Fibrillation       History of Present Illness: Bart Barmes is a 80 y.o. male who presents for evaluation post pericardial window for effusion and atrial fib.    The etiology of this was not clear. There was no evidence of infection or malignancy.  He was treated with colchicine.    Since I last saw him he has done well.  He does not notice his atrial fibrillation.  The patient denies any new symptoms such as chest discomfort, neck or arm discomfort. There has been no new shortness of breath, PND or orthopnea. There have been no reported palpitations, presyncope or syncope.  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"   Gout    High cholesterol    Hypertension    Obesity    Pneumonia 2014 X 1   Prostate cancer ([place])    Type II diabetes mellitus ([place])     Past Surgical History:  Procedure Laterality Date   INCISION AND DRAINAGE PERIRECTAL ABSCESS  1990's   "hemorrhoid"   KNEE ARTHROSCOPY Right ~ 2011   PROSTATE SURGERY  1996   SKIN CANCER EXCISION  ~ 2005 X 2   "left foot"   SUBXYPHOID PERICARDIAL WINDOW N/A 12/21/2018   Procedure: SUBXYPHOID PERICARDIAL WINDOW;  Surgeon: Ivin Poot, MD;  Location: [facility];  Service: Thoracic;  Laterality: N/A;   TEE WITHOUT CARDIOVERSION N/A 12/21/2018   Procedure: TRANSESOPHAGEAL ECHOCARDIOGRAM (TEE);  Surgeon: Prescott Gum, Collier Salina, MD;  Location: [facility] OR;  Service: Thoracic;  Laterality: N/A;     Current Outpatient Medications  Medication Sig Dispense Refill   allopurinol (ZYLOPRIM) 100 MG tablet Take 100 mg by mouth daily.      amLODipine (NORVASC) 5 MG tablet Take 5 mg by mouth daily.     atorvastatin (LIPITOR) 20 MG tablet Take 20 mg by mouth daily.     clonazePAM (KLONOPIN) 0.5 MG tablet Take 0.5 mg by mouth at bedtime as needed (sleep).     ELIQUIS 5 MG TABS tablet TAKE 1 TABLET BY MOUTH TWICE A DAY 99991111 tablet 1   folic acid (FOLVITE) 1 MG tablet Take 1 tablet (1 mg total) by mouth daily. 30 tablet 1   meloxicam (MOBIC) 15 MG tablet Take 1 tablet (15 mg total) by mouth daily. 90 tablet 3   metoprolol succinate (TOPROL-XL) 25 MG 24 hr tablet TAKE 1 TABLET BY MOUTH EVERY DAY 90 tablet 1   No current facility-administered medications for this visit.    Allergies:   Patient has no known allergies.    ROS:  Please see the history of present illness.   Otherwise, review of systems are positive for none.   All other systems are reviewed and negative.    PHYSICAL EXAM: VS:  BP 125/73   Pulse (!) 54   Ht '6\' 1"'$  (1.854 m)   Wt 236 lb (107 kg)   SpO2 94%   BMI 31.14 kg/m  , BMI Body mass index is 31.14 kg/m. GENERAL:  Well appearing NECK:  No jugular venous distention, waveform within normal limits, carotid upstroke brisk and symmetric, no bruits, no thyromegaly LUNGS:  Clear to auscultation bilaterally CHEST:  Unremarkable HEART:  PMI not displaced or sustained,S1 and S2 within normal limits, no S3, no clicks, no rubs, no murmurs, irregular  ABD:  Flat, positive bowel sounds normal in frequency in pitch, no bruits, no rebound, no guarding, no midline pulsatile mass, no hepatomegaly, no splenomegaly EXT:  2 plus pulses throughout, no edema, no cyanosis no clubbing  EKG:  EKG is  ordered today. The ekg ordered today demonstrates atrial fibrillation, rate 54, axis within normal limits, intervals within normal limits, no acute ST-T wave changes.   Recent Labs: No results found for requested labs within last 8760 hours.    Lipid Panel    Component Value Date/Time   CHOL 107 12/21/2018 0348   TRIG 49  12/21/2018 0348   HDL 49 12/21/2018 0348   CHOLHDL 2.2 12/21/2018 0348   VLDL 10 12/21/2018 0348   LDLCALC 48 12/21/2018 0348      Wt Readings from Last 3 Encounters:  06/06/21 236 lb (107 kg)  12/19/20 242 lb (109.8 kg)  06/11/20 235 lb 12.8 oz (107 kg)      Other studies Reviewed: Additional studies/ records that were reviewed today include: Labs Review of the above records demonstrates:  Please see elsewhere in the note.     ASSESSMENT AND PLAN:  Pericardial Effusion:     I would have no suggestion that he had recurrence of this after his window.  There was no clear etiology but he is done well otherwise.  No change in therapy or further imaging.   Atrial fib:   He tolerates this with anticoagulation and good rate control.  No change in therapy.   Medications: I have asked him to check with his primary provider to see if he still needs Mobic as he does not recall taking this.  Current medicines are reviewed at length with the patient today.  The patient does not have concerns regarding medicines.  The following changes have been made: None  Labs/ tests ordered today include:   None  Orders Placed This Encounter  Procedures   EKG 12-Lead      Disposition:   FU with me in 1.5 years.     Signed, Minus Breeding, MD  06/06/2021 8:35 AM    [facility]

## 2021-06-06 ENCOUNTER — Encounter: Payer: Self-pay | Admitting: Cardiology

## 2021-06-06 ENCOUNTER — Ambulatory Visit: Payer: Medicare HMO | Admitting: Cardiology

## 2021-06-06 ENCOUNTER — Other Ambulatory Visit: Payer: Self-pay

## 2021-06-06 VITALS — BP 125/73 | HR 54 | Ht 73.0 in | Wt 236.0 lb

## 2021-06-06 DIAGNOSIS — I3139 Other pericardial effusion (noninflammatory): Secondary | ICD-10-CM

## 2021-06-06 DIAGNOSIS — I480 Paroxysmal atrial fibrillation: Secondary | ICD-10-CM | POA: Diagnosis not present

## 2021-06-06 DIAGNOSIS — I313 Pericardial effusion (noninflammatory): Secondary | ICD-10-CM

## 2021-06-06 NOTE — Patient Instructions (Signed)
  Follow-Up: At [facility], you and your health needs are our priority.  As part of our continuing mission to provide you with exceptional heart care, we have created designated Provider Care Teams.  These Care Teams include your primary Cardiologist (physician) and Advanced Practice Providers (APPs -  Physician Assistants and Nurse Practitioners) who all work together to provide you with the care you need, when you need it.  We recommend signing up for the patient portal called "MyChart".  Sign up information is provided on this After Visit Summary.  MyChart is used to connect with patients for Virtual Visits (Telemedicine).  Patients are able to view lab/test results, encounter notes, upcoming appointments, etc.  Non-urgent messages can be sent to your provider as well.   To learn more about what you can do with MyChart, go to [email or web address].    Your next appointment:   18 month(s)  The format for your next appointment:   In Person  Provider:   Minus Breeding, MD

## 2021-06-17 ENCOUNTER — Telehealth: Payer: Self-pay | Admitting: *Deleted

## 2021-06-17 NOTE — Telephone Encounter (Signed)
Patient's sister called to say it only him living in the home.

## 2021-06-17 NOTE — Telephone Encounter (Signed)
Left message for pt sister to call, need to know how many people live in the patients home for the [employer] application for eliquis.

## 2021-06-17 NOTE — Telephone Encounter (Signed)
Application re-faxed to the company

## 2021-06-25 DIAGNOSIS — E119 Type 2 diabetes mellitus without complications: Secondary | ICD-10-CM | POA: Diagnosis not present

## 2021-06-25 DIAGNOSIS — M1A09X0 Idiopathic chronic gout, multiple sites, without tophus (tophi): Secondary | ICD-10-CM | POA: Diagnosis not present

## 2021-06-25 DIAGNOSIS — F5102 Adjustment insomnia: Secondary | ICD-10-CM | POA: Diagnosis not present

## 2021-06-25 DIAGNOSIS — N1831 Chronic kidney disease, stage 3a: Secondary | ICD-10-CM | POA: Diagnosis not present

## 2021-06-25 DIAGNOSIS — E7800 Pure hypercholesterolemia, unspecified: Secondary | ICD-10-CM | POA: Diagnosis not present

## 2021-06-25 DIAGNOSIS — I700 Atherosclerosis of aorta: Secondary | ICD-10-CM | POA: Diagnosis not present

## 2021-06-25 DIAGNOSIS — I129 Hypertensive chronic kidney disease with stage 1 through stage 4 chronic kidney disease, or unspecified chronic kidney disease: Secondary | ICD-10-CM | POA: Diagnosis not present

## 2021-06-25 DIAGNOSIS — Z23 Encounter for immunization: Secondary | ICD-10-CM | POA: Diagnosis not present

## 2021-06-25 DIAGNOSIS — L600 Ingrowing nail: Secondary | ICD-10-CM | POA: Diagnosis not present

## 2021-06-25 DIAGNOSIS — Z0000 Encounter for general adult medical examination without abnormal findings: Secondary | ICD-10-CM | POA: Diagnosis not present

## 2021-06-25 DIAGNOSIS — I4819 Other persistent atrial fibrillation: Secondary | ICD-10-CM | POA: Diagnosis not present

## 2021-06-26 ENCOUNTER — Other Ambulatory Visit: Payer: Self-pay

## 2021-06-26 ENCOUNTER — Encounter: Payer: Self-pay | Admitting: Specialist

## 2021-06-26 ENCOUNTER — Ambulatory Visit: Payer: Medicare HMO | Admitting: Specialist

## 2021-06-26 VITALS — BP 116/75 | HR 72 | Ht 72.0 in | Wt 235.0 lb

## 2021-06-26 DIAGNOSIS — M1712 Unilateral primary osteoarthritis, left knee: Secondary | ICD-10-CM | POA: Diagnosis not present

## 2021-06-26 DIAGNOSIS — M1711 Unilateral primary osteoarthritis, right knee: Secondary | ICD-10-CM | POA: Diagnosis not present

## 2021-06-26 NOTE — Progress Notes (Signed)
Office Visit Note   Patient: Ian Estes           Date of Birth: 1940/10/21           MRN: [id number] Visit Date: 06/26/2021              Requested by: Aletha Halim., PA-C [address],  [place] [address] PCP: Aletha Halim., PA-C   Assessment & Plan: Visit Diagnoses:  1. Unilateral primary osteoarthritis, left knee   2. Unilateral primary osteoarthritis, right knee     Plan: Plan: Knee is suffering from osteoarthritis, only real proven treatments are Weight loss, NSIADs like meloxicam and exercise. Well padded shoes help. Ice the knee that is suffering from osteoarthritis, only real proven treatments are   Well padded shoes help. Ice the knee 2-3 times a day 15-20 mins at a time.3 times a day 15-20 mins at a time. Hot showers in the AM.  Injection with steroid may be of benefit. Hemp CBD capsules, amazon.com 5,000-7,000 mg per bottle, 60 capsules per bottle, take one capsule twice a day. Cane in the left hand to use with left leg weight bearing. Left foot has a hard corn and the toes show clawing, Follow-Up Instructions: No follow-ups on file.  Meloxican (mobic)Follow-Up Instructions: No follow-ups on file.     Follow-Up Instructions: No follow-ups on file.   Orders:  No orders of the defined types were placed in this encounter.  No orders of the defined types were placed in this encounter.     Procedures: No procedures performed   Clinical Data: No additional findings.   Subjective: Chief Complaint  Patient presents with   Right Knee - Follow-up   Left Knee - Follow-up    80 year old male with history of bilateral knee osteoarthritis with AM stiffness and starting stiffness and pain. Not much swelling. He is on eloquis daily for his heart a fib. He is being seen by a podiatrist for his ingrown toe nails. He is to have intervention with the ingrown Nails. Great toe toe nail got inflamed and red.   Review of Systems   Constitutional:  Positive for activity change. Negative for appetite change, chills, diaphoresis, fatigue, fever and unexpected weight change.  HENT: Negative.    Eyes: Negative.   Respiratory: Negative.    Cardiovascular: Negative.   Gastrointestinal: Negative.   Endocrine: Negative.   Genitourinary: Negative.   Musculoskeletal: Negative.   Skin: Negative.   Allergic/Immunologic: Negative.   Neurological: Negative.   Hematological: Negative.   Psychiatric/Behavioral: Negative.      Objective: Vital Signs: BP 116/75 (BP Location: Left Arm, Patient Position: Sitting)   Pulse 72   Ht 6' (1.829 m)   Wt 235 lb (106.6 kg)   BMI 31.87 kg/m   Physical Exam Constitutional:      Appearance: He is well-developed.  HENT:     Head: Normocephalic and atraumatic.  Eyes:     Pupils: Pupils are equal, round, and reactive to light.  Pulmonary:     Effort: Pulmonary effort is normal.     Breath sounds: Normal breath sounds.  Abdominal:     General: Bowel sounds are normal.     Palpations: Abdomen is soft.  Musculoskeletal:     Cervical back: Normal range of motion and neck supple.     Right knee: No effusion.     Instability Tests: Medial McMurray test positive. Lateral McMurray test negative.     Left knee:  No effusion.     Instability Tests: Medial McMurray test positive. Lateral McMurray test negative.  Skin:    General: Skin is warm and dry.  Neurological:     Mental Status: He is alert and oriented to person, place, and time.  Psychiatric:        Behavior: Behavior normal.        Thought Content: Thought content normal.        Judgment: Judgment normal.   Right Knee Exam   Muscle Strength  The patient has normal right knee strength.  Tenderness  The patient is experiencing tenderness in the medial retinaculum and medial joint line.  Range of Motion  Extension:  -10 abnormal  Flexion:  130 abnormal   Tests  McMurray:  Medial - positive Lateral - negative Varus:  negative Valgus: negative Drawer:  Anterior - negative    Posterior - negative Pivot shift: negative Patellar apprehension: negative  Other  Erythema: absent Scars: absent Sensation: normal Pulse: present Effusion: no effusion present   Left Knee Exam   Tenderness  The patient is experiencing tenderness in the medial retinaculum and medial joint line.  Range of Motion  Extension:  -10  Flexion:  130   Tests  McMurray:  Medial - positive Lateral - negative Varus: negative Valgus: negative Drawer:  Anterior - negative     Posterior - negative Pivot shift: negative Patellar apprehension: negative  Other  Erythema: absent Scars: absent Sensation: normal Pulse: present Effusion: no effusion present   Back Exam   Range of Motion  Extension:  normal  Flexion:  normal     Specialty Comments:  No specialty comments available.  Imaging: No results found.   PMFS History: Patient Active Problem List   Diagnosis Date Noted   Educated about COVID-19 virus infection 06/10/2020   S/P pericardial window creation 12/21/2018   Chest pain 12/20/2018   Unspecified atrial fibrillation ([place]) 12/20/2018   Pericardial effusion 12/20/2018   Chronic abdominal pain 06/13/2014   Hyperlipidemia 06/13/2014   Acute renal failure ([place]) 06/13/2014   Dehydration 06/13/2014   Diabetes mellitus without complication ([place])    Hypertension    Past Medical History:  Diagnosis Date   Arthritis    "all my joints" (06/13/2014)   Cancer ([place]) ~ 2005   "left foot; malignant; 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"   Gout    High cholesterol    Hypertension    Obesity    Pneumonia 2014 X 1   Prostate cancer ([place])    Type II diabetes mellitus ([place])     Family History  Problem Relation Age of Onset   CAD Neg Hx     Past Surgical History:  Procedure Laterality Date   INCISION AND DRAINAGE PERIRECTAL ABSCESS  1990's   "hemorrhoid"   KNEE ARTHROSCOPY Right ~ 2011   PROSTATE SURGERY  1996    SKIN CANCER EXCISION  ~ 2005 X 2   "left foot"   SUBXYPHOID PERICARDIAL WINDOW N/A 12/21/2018   Procedure: SUBXYPHOID PERICARDIAL WINDOW;  Surgeon: Ivin Poot, MD;  Location: [facility];  Service: Thoracic;  Laterality: N/A;   TEE WITHOUT CARDIOVERSION N/A 12/21/2018   Procedure: TRANSESOPHAGEAL ECHOCARDIOGRAM (TEE);  Surgeon: Prescott Gum, Collier Salina, MD;  Location: [facility] OR;  Service: Thoracic;  Laterality: N/A;   Social History   Occupational History   Occupation: retired  Tobacco Use   Smoking status: Former    Packs/day: 1.50    Years: 32.00    Pack years:  48.00    Types: Cigarettes    Quit date: 08/20/1987    Years since quitting: 33.8   Smokeless tobacco: Never  Vaping Use   Vaping Use: Never used  Substance and Sexual Activity   Alcohol use: Yes    Alcohol/week: 21.0 standard drinks    Types: 21 Cans of beer per week    Comment: 3 beers a day, no liquor in 5 years   Drug use: No   Sexual activity: Not Currently

## 2021-06-26 NOTE — Patient Instructions (Signed)
Plan: Knee is suffering from osteoarthritis, only real proven treatments are Weight loss, NSIADs like meloxicam and exercise. Well padded shoes help. Ice the knee that is suffering from osteoarthritis, only real proven treatments are   Well padded shoes help. Ice the knee 2-3 times a day 15-20 mins at a time.3 times a day 15-20 mins at a time. Hot showers in the AM.  Injection with steroid may be of benefit. Hemp CBD capsules, [employer]on[employer] 5,000-7,000 mg per bottle, 60 capsules per bottle, take one capsule twice a day. Cane in the left hand to use with left leg weight bearing. Left foot has a hard corn and the toes show clawing, Follow-Up Instructions: No follow-ups on file.  Meloxican (mobic)Follow-Up Instructions: No follow-ups on file.   Your kidney function is remaining the same so I believe you can continue to use the meloxicam 15 mg per day. If swelling in the legs worsens or hypertension worsens then stop the meloxicam and call your primary care MD to have laboratory tests repeated. Stay hydrated.

## 2021-07-04 ENCOUNTER — Telehealth: Payer: Self-pay | Admitting: Cardiology

## 2021-07-04 NOTE — Telephone Encounter (Signed)
Spoke to patient's sister [name] she stated Eliquis 5 mg that was received from patient assistance says to take 5 mg daily.Advised he should take 5 mg twice a day.She will call patient assistance and make them aware.

## 2021-07-04 NOTE — Telephone Encounter (Signed)
Pt c/o medication issue:  1. Name of Medication: ELIQUIS 5 MG TABS tablet  2. How are you currently taking this medication (dosage and times per day)? 1 tablet twice a day  3. Are you having a reaction (difficulty breathing--STAT)? no  4. What is your medication issue? Patient's sister states the new bottle of eliquis says to take once daily, but his previous bottle said twice daily.

## 2021-07-08 DIAGNOSIS — M2042 Other hammer toe(s) (acquired), left foot: Secondary | ICD-10-CM | POA: Diagnosis not present

## 2021-07-08 DIAGNOSIS — B351 Tinea unguium: Secondary | ICD-10-CM | POA: Diagnosis not present

## 2021-07-08 DIAGNOSIS — M2041 Other hammer toe(s) (acquired), right foot: Secondary | ICD-10-CM | POA: Diagnosis not present

## 2021-09-09 ENCOUNTER — Telehealth: Payer: Self-pay | Admitting: *Deleted

## 2021-09-09 NOTE — Telephone Encounter (Signed)
Patient assistance forms for eliquis for 2023 faxed to the company.

## 2021-11-05 ENCOUNTER — Other Ambulatory Visit: Payer: Self-pay | Admitting: Cardiology

## 2021-12-09 NOTE — Telephone Encounter (Signed)
Spoke with pt, aware he will need to spend $277.62 and then he will qualify for assistance. He is aware to contact us once that amount has been paid out of pocket.

## 2021-12-16 DIAGNOSIS — B351 Tinea unguium: Secondary | ICD-10-CM | POA: Diagnosis not present

## 2021-12-16 DIAGNOSIS — I739 Peripheral vascular disease, unspecified: Secondary | ICD-10-CM | POA: Diagnosis not present

## 2021-12-26 DIAGNOSIS — I739 Peripheral vascular disease, unspecified: Secondary | ICD-10-CM | POA: Diagnosis not present

## 2021-12-31 DIAGNOSIS — E119 Type 2 diabetes mellitus without complications: Secondary | ICD-10-CM | POA: Diagnosis not present

## 2021-12-31 DIAGNOSIS — E7800 Pure hypercholesterolemia, unspecified: Secondary | ICD-10-CM | POA: Diagnosis not present

## 2021-12-31 DIAGNOSIS — I4819 Other persistent atrial fibrillation: Secondary | ICD-10-CM | POA: Diagnosis not present

## 2021-12-31 DIAGNOSIS — I129 Hypertensive chronic kidney disease with stage 1 through stage 4 chronic kidney disease, or unspecified chronic kidney disease: Secondary | ICD-10-CM | POA: Diagnosis not present

## 2021-12-31 DIAGNOSIS — N1831 Chronic kidney disease, stage 3a: Secondary | ICD-10-CM | POA: Diagnosis not present

## 2021-12-31 DIAGNOSIS — M1A09X0 Idiopathic chronic gout, multiple sites, without tophus (tophi): Secondary | ICD-10-CM | POA: Diagnosis not present

## 2021-12-31 DIAGNOSIS — F5102 Adjustment insomnia: Secondary | ICD-10-CM | POA: Diagnosis not present

## 2021-12-31 DIAGNOSIS — N529 Male erectile dysfunction, unspecified: Secondary | ICD-10-CM | POA: Diagnosis not present

## 2022-01-13 DIAGNOSIS — B351 Tinea unguium: Secondary | ICD-10-CM | POA: Diagnosis not present

## 2022-01-13 DIAGNOSIS — M2041 Other hammer toe(s) (acquired), right foot: Secondary | ICD-10-CM | POA: Diagnosis not present

## 2022-01-13 DIAGNOSIS — M2042 Other hammer toe(s) (acquired), left foot: Secondary | ICD-10-CM | POA: Diagnosis not present

## 2022-01-13 DIAGNOSIS — I739 Peripheral vascular disease, unspecified: Secondary | ICD-10-CM | POA: Diagnosis not present

## 2022-01-15 DIAGNOSIS — M1A09X0 Idiopathic chronic gout, multiple sites, without tophus (tophi): Secondary | ICD-10-CM | POA: Diagnosis not present

## 2022-01-15 DIAGNOSIS — E119 Type 2 diabetes mellitus without complications: Secondary | ICD-10-CM | POA: Diagnosis not present

## 2022-01-15 DIAGNOSIS — E7800 Pure hypercholesterolemia, unspecified: Secondary | ICD-10-CM | POA: Diagnosis not present

## 2022-01-22 DIAGNOSIS — K1379 Other lesions of oral mucosa: Secondary | ICD-10-CM | POA: Diagnosis not present

## 2022-01-22 DIAGNOSIS — B349 Viral infection, unspecified: Secondary | ICD-10-CM | POA: Diagnosis not present

## 2022-01-22 DIAGNOSIS — R634 Abnormal weight loss: Secondary | ICD-10-CM | POA: Diagnosis not present

## 2022-01-30 DIAGNOSIS — R634 Abnormal weight loss: Secondary | ICD-10-CM | POA: Diagnosis not present

## 2022-01-30 DIAGNOSIS — K1379 Other lesions of oral mucosa: Secondary | ICD-10-CM | POA: Diagnosis not present

## 2022-04-17 ENCOUNTER — Other Ambulatory Visit: Payer: Self-pay

## 2022-04-17 MED ORDER — APIXABAN 5 MG PO TABS: 5.0000 mg | ORAL_TABLET | Freq: Two times a day (BID) | ORAL | 1 refills | Status: DC

## 2022-04-17 NOTE — Telephone Encounter (Signed)
Prescription refill request for Eliquis received. Indication:Afib Last office visit:8/22 Scr:1.2 Age: 81 Weight:106.6 kg  Prescription refilled

## 2022-05-09 ENCOUNTER — Other Ambulatory Visit: Payer: Self-pay | Admitting: Cardiology

## 2022-05-12 DIAGNOSIS — M170 Bilateral primary osteoarthritis of knee: Secondary | ICD-10-CM | POA: Diagnosis not present

## 2022-07-01 DIAGNOSIS — Z0000 Encounter for general adult medical examination without abnormal findings: Secondary | ICD-10-CM | POA: Diagnosis not present

## 2022-07-01 DIAGNOSIS — M25561 Pain in right knee: Secondary | ICD-10-CM | POA: Diagnosis not present

## 2022-07-01 DIAGNOSIS — I129 Hypertensive chronic kidney disease with stage 1 through stage 4 chronic kidney disease, or unspecified chronic kidney disease: Secondary | ICD-10-CM | POA: Diagnosis not present

## 2022-07-01 DIAGNOSIS — F5102 Adjustment insomnia: Secondary | ICD-10-CM | POA: Diagnosis not present

## 2022-07-01 DIAGNOSIS — E119 Type 2 diabetes mellitus without complications: Secondary | ICD-10-CM | POA: Diagnosis not present

## 2022-07-01 DIAGNOSIS — E7800 Pure hypercholesterolemia, unspecified: Secondary | ICD-10-CM | POA: Diagnosis not present

## 2022-07-01 DIAGNOSIS — Z87891 Personal history of nicotine dependence: Secondary | ICD-10-CM | POA: Diagnosis not present

## 2022-07-01 DIAGNOSIS — D6859 Other primary thrombophilia: Secondary | ICD-10-CM | POA: Diagnosis not present

## 2022-07-01 DIAGNOSIS — M25562 Pain in left knee: Secondary | ICD-10-CM | POA: Diagnosis not present

## 2022-07-01 DIAGNOSIS — M1A09X0 Idiopathic chronic gout, multiple sites, without tophus (tophi): Secondary | ICD-10-CM | POA: Diagnosis not present

## 2022-07-01 DIAGNOSIS — R109 Unspecified abdominal pain: Secondary | ICD-10-CM | POA: Diagnosis not present

## 2022-07-01 DIAGNOSIS — N1831 Chronic kidney disease, stage 3a: Secondary | ICD-10-CM | POA: Diagnosis not present

## 2022-07-01 DIAGNOSIS — I700 Atherosclerosis of aorta: Secondary | ICD-10-CM | POA: Diagnosis not present

## 2022-07-01 DIAGNOSIS — Z23 Encounter for immunization: Secondary | ICD-10-CM | POA: Diagnosis not present

## 2022-09-24 DIAGNOSIS — R109 Unspecified abdominal pain: Secondary | ICD-10-CM | POA: Diagnosis not present

## 2022-10-13 DIAGNOSIS — R109 Unspecified abdominal pain: Secondary | ICD-10-CM | POA: Diagnosis not present

## 2022-10-15 DIAGNOSIS — R109 Unspecified abdominal pain: Secondary | ICD-10-CM | POA: Diagnosis not present

## 2022-10-15 DIAGNOSIS — Z23 Encounter for immunization: Secondary | ICD-10-CM | POA: Diagnosis not present

## 2022-10-21 ENCOUNTER — Encounter: Payer: Self-pay | Admitting: *Deleted

## 2022-10-22 DIAGNOSIS — R1033 Periumbilical pain: Secondary | ICD-10-CM | POA: Diagnosis not present

## 2022-10-22 DIAGNOSIS — Z86010 Personal history of colonic polyps: Secondary | ICD-10-CM | POA: Diagnosis not present

## 2022-10-22 DIAGNOSIS — E669 Obesity, unspecified: Secondary | ICD-10-CM | POA: Diagnosis not present

## 2022-10-22 DIAGNOSIS — K5730 Diverticulosis of large intestine without perforation or abscess without bleeding: Secondary | ICD-10-CM | POA: Diagnosis not present

## 2022-10-22 DIAGNOSIS — K219 Gastro-esophageal reflux disease without esophagitis: Secondary | ICD-10-CM | POA: Diagnosis not present

## 2022-10-22 DIAGNOSIS — K5900 Constipation, unspecified: Secondary | ICD-10-CM | POA: Diagnosis not present

## 2022-10-25 ENCOUNTER — Other Ambulatory Visit: Payer: Self-pay

## 2022-10-25 ENCOUNTER — Encounter (HOSPITAL_COMMUNITY): Payer: Self-pay

## 2022-10-25 ENCOUNTER — Emergency Department (HOSPITAL_BASED_OUTPATIENT_CLINIC_OR_DEPARTMENT_OTHER): Payer: Medicare HMO

## 2022-10-25 ENCOUNTER — Inpatient Hospital Stay (HOSPITAL_BASED_OUTPATIENT_CLINIC_OR_DEPARTMENT_OTHER)
Admission: EM | Admit: 2022-10-25 | Discharge: 2022-10-30 | DRG: 392 | Disposition: A | Discharge status: Home / Self Care | Payer: Medicare HMO | Attending: Internal Medicine | Admitting: Internal Medicine

## 2022-10-25 ENCOUNTER — Encounter (HOSPITAL_BASED_OUTPATIENT_CLINIC_OR_DEPARTMENT_OTHER): Payer: Self-pay | Admitting: Emergency Medicine

## 2022-10-25 DIAGNOSIS — R600 Localized edema: Secondary | ICD-10-CM | POA: Diagnosis present

## 2022-10-25 DIAGNOSIS — E7800 Pure hypercholesterolemia, unspecified: Secondary | ICD-10-CM | POA: Diagnosis not present

## 2022-10-25 DIAGNOSIS — N179 Acute kidney failure, unspecified: Secondary | ICD-10-CM | POA: Diagnosis not present

## 2022-10-25 DIAGNOSIS — I129 Hypertensive chronic kidney disease with stage 1 through stage 4 chronic kidney disease, or unspecified chronic kidney disease: Secondary | ICD-10-CM | POA: Diagnosis not present

## 2022-10-25 DIAGNOSIS — K5730 Diverticulosis of large intestine without perforation or abscess without bleeding: Secondary | ICD-10-CM | POA: Diagnosis not present

## 2022-10-25 DIAGNOSIS — Z87891 Personal history of nicotine dependence: Secondary | ICD-10-CM

## 2022-10-25 DIAGNOSIS — Z79899 Other long term (current) drug therapy: Secondary | ICD-10-CM

## 2022-10-25 DIAGNOSIS — M109 Gout, unspecified: Secondary | ICD-10-CM | POA: Diagnosis not present

## 2022-10-25 DIAGNOSIS — K5720 Diverticulitis of large intestine with perforation and abscess without bleeding: Principal | ICD-10-CM | POA: Diagnosis present

## 2022-10-25 DIAGNOSIS — E785 Hyperlipidemia, unspecified: Secondary | ICD-10-CM | POA: Diagnosis present

## 2022-10-25 DIAGNOSIS — I700 Atherosclerosis of aorta: Secondary | ICD-10-CM | POA: Diagnosis present

## 2022-10-25 DIAGNOSIS — K5780 Diverticulitis of intestine, part unspecified, with perforation and abscess without bleeding: Secondary | ICD-10-CM | POA: Diagnosis present

## 2022-10-25 DIAGNOSIS — I482 Chronic atrial fibrillation, unspecified: Secondary | ICD-10-CM | POA: Diagnosis not present

## 2022-10-25 DIAGNOSIS — Z7901 Long term (current) use of anticoagulants: Secondary | ICD-10-CM | POA: Diagnosis not present

## 2022-10-25 DIAGNOSIS — N1831 Chronic kidney disease, stage 3a: Secondary | ICD-10-CM | POA: Diagnosis present

## 2022-10-25 DIAGNOSIS — E1122 Type 2 diabetes mellitus with diabetic chronic kidney disease: Secondary | ICD-10-CM | POA: Diagnosis not present

## 2022-10-25 DIAGNOSIS — Z8546 Personal history of malignant neoplasm of prostate: Secondary | ICD-10-CM | POA: Diagnosis not present

## 2022-10-25 DIAGNOSIS — I10 Essential (primary) hypertension: Secondary | ICD-10-CM | POA: Diagnosis present

## 2022-10-25 DIAGNOSIS — N2889 Other specified disorders of kidney and ureter: Secondary | ICD-10-CM | POA: Diagnosis not present

## 2022-10-25 DIAGNOSIS — E8770 Fluid overload, unspecified: Secondary | ICD-10-CM | POA: Diagnosis not present

## 2022-10-25 DIAGNOSIS — E871 Hypo-osmolality and hyponatremia: Secondary | ICD-10-CM | POA: Diagnosis not present

## 2022-10-25 DIAGNOSIS — Z85828 Personal history of other malignant neoplasm of skin: Secondary | ICD-10-CM | POA: Diagnosis not present

## 2022-10-25 HISTORY — DX: Diverticulitis of intestine, part unspecified, without perforation or abscess without bleeding: K57.92

## 2022-10-25 LAB — CBC
HCT: 39.6 % (ref 39.0–52.0)
Hemoglobin: 12.7 g/dL — ABNORMAL LOW (ref 13.0–17.0)
MCH: 27.4 pg (ref 26.0–34.0)
MCHC: 32.1 g/dL (ref 30.0–36.0)
MCV: 85.5 fL (ref 80.0–100.0)
Platelets: 246 10*3/uL (ref 150–400)
RBC: 4.63 MIL/uL (ref 4.22–5.81)
RDW: 14.2 % (ref 11.5–15.5)
WBC: 14 10*3/uL — ABNORMAL HIGH (ref 4.0–10.5)
nRBC: 0 % (ref 0.0–0.2)

## 2022-10-25 LAB — COMPREHENSIVE METABOLIC PANEL
ALT: 9 U/L (ref 0–44)
AST: 16 U/L (ref 15–41)
Albumin: 3.5 g/dL (ref 3.5–5.0)
Alkaline Phosphatase: 143 U/L — ABNORMAL HIGH (ref 38–126)
Anion gap: 13 (ref 5–15)
BUN: 15 mg/dL (ref 8–23)
CO2: 23 mmol/L (ref 22–32)
Calcium: 9 mg/dL (ref 8.9–10.3)
Chloride: 100 mmol/L (ref 98–111)
Creatinine, Ser: 1.3 mg/dL — ABNORMAL HIGH (ref 0.61–1.24)
GFR, Estimated: 55 mL/min — ABNORMAL LOW (ref >60)
Glucose, Bld: 133 mg/dL — ABNORMAL HIGH (ref 70–99)
Potassium: 4.8 mmol/L (ref 3.5–5.1)
Sodium: 136 mmol/L (ref 135–145)
Total Bilirubin: 0.9 mg/dL (ref 0.3–1.2)
Total Protein: 7.1 g/dL (ref 6.5–8.1)

## 2022-10-25 LAB — URINALYSIS, ROUTINE W REFLEX MICROSCOPIC
Bilirubin Urine: NEGATIVE
Glucose, UA: NEGATIVE mg/dL
Hgb urine dipstick: NEGATIVE
Ketones, ur: 15 mg/dL — AB
Leukocytes,Ua: NEGATIVE
Nitrite: NEGATIVE
Protein, ur: NEGATIVE mg/dL
Specific Gravity, Urine: 1.018 (ref 1.005–1.030)
pH: 6.5 (ref 5.0–8.0)

## 2022-10-25 LAB — LIPASE, BLOOD: Lipase: <10 U/L — ABNORMAL LOW (ref 11–51)

## 2022-10-25 MED ORDER — SODIUM CHLORIDE 0.9 % IV SOLN
3.0000 g | Freq: Once | INTRAVENOUS | Status: AC
  Administered 2022-10-25: 3 g via INTRAVENOUS

## 2022-10-25 MED ORDER — IOHEXOL 300 MG/ML  SOLN
100.0000 mL | Freq: Once | INTRAMUSCULAR | Status: AC | PRN
  Administered 2022-10-25: 100 mL via INTRAVENOUS

## 2022-10-25 MED ORDER — HYDROMORPHONE HCL 1 MG/ML IJ SOLN
0.5000 mg | INTRAMUSCULAR | Status: DC | PRN
  Administered 2022-10-25: 0.5 mg via INTRAVENOUS
  Filled 2022-10-25: qty 1

## 2022-10-25 MED ORDER — FENTANYL CITRATE PF 50 MCG/ML IJ SOSY
25.0000 ug | PREFILLED_SYRINGE | Freq: Once | INTRAMUSCULAR | Status: AC
  Administered 2022-10-25: 25 ug via INTRAVENOUS
  Filled 2022-10-25: qty 1

## 2022-10-25 MED ORDER — SODIUM CHLORIDE 0.9 % IV SOLN
2.0000 g | INTRAVENOUS | Status: DC
  Administered 2022-10-25: 2 g via INTRAVENOUS
  Filled 2022-10-25: qty 20

## 2022-10-25 MED ORDER — METOPROLOL TARTRATE 5 MG/5ML IV SOLN: 5.0000 mg | Freq: Four times a day (QID) | INTRAVENOUS | Status: DC | PRN

## 2022-10-25 MED ORDER — METOPROLOL TARTRATE 5 MG/5ML IV SOLN
5.0000 mg | Freq: Four times a day (QID) | INTRAVENOUS | Status: DC
  Administered 2022-10-25 – 2022-10-26 (×3): 5 mg via INTRAVENOUS
  Filled 2022-10-25 (×3): qty 5

## 2022-10-25 MED ORDER — HYDROMORPHONE HCL 1 MG/ML IJ SOLN
0.5000 mg | INTRAMUSCULAR | Status: DC | PRN
  Administered 2022-10-26 – 2022-10-28 (×3): 0.5 mg via INTRAVENOUS
  Filled 2022-10-25 (×3): qty 0.5

## 2022-10-25 MED ORDER — METRONIDAZOLE 500 MG/100ML IV SOLN
500.0000 mg | Freq: Two times a day (BID) | INTRAVENOUS | Status: DC
  Administered 2022-10-25 – 2022-10-26 (×2): 500 mg via INTRAVENOUS
  Filled 2022-10-25 (×2): qty 100

## 2022-10-25 MED ORDER — LACTATED RINGERS IV BOLUS
500.0000 mL | Freq: Once | INTRAVENOUS | Status: AC
  Administered 2022-10-25: 500 mL via INTRAVENOUS

## 2022-10-25 MED ORDER — ACETAMINOPHEN 650 MG RE SUPP: 650.0000 mg | Freq: Four times a day (QID) | RECTAL | Status: DC | PRN

## 2022-10-25 MED ORDER — ACETAMINOPHEN 325 MG PO TABS: 650.0000 mg | ORAL_TABLET | Freq: Four times a day (QID) | ORAL | Status: DC | PRN

## 2022-10-25 MED ORDER — LACTATED RINGERS IV SOLN: INTRAVENOUS | Status: AC

## 2022-10-25 MED ORDER — ONDANSETRON HCL 4 MG/2ML IJ SOLN
4.0000 mg | Freq: Four times a day (QID) | INTRAMUSCULAR | Status: DC | PRN
  Administered 2022-10-25 – 2022-10-27 (×2): 4 mg via INTRAVENOUS
  Filled 2022-10-25 (×2): qty 2

## 2022-10-25 NOTE — ED Notes (Signed)
Report given to [facility] and the Floor RN.

## 2022-10-25 NOTE — ED Triage Notes (Signed)
Hx diverticulitis,  6 weeks ago had abd pain for few hours and then resolved, flares all this time. But today pain is constant and worse. Middle abdominal pain, sharp. Saw GI Dr. Collene Mares, took one of the pills she ordered  but didn't help.

## 2022-10-25 NOTE — ED Provider Notes (Incomplete Revision)
Admit for diverticulitis with perf and abscess. Physical Exam  BP (!) 156/77 (BP Location: Right Arm)   Pulse 71   Temp 97.6 F (36.4 C)   Resp 16   SpO2 99%   Physical Exam  Procedures  Procedures  ED Course / MDM   Clinical Course as of 10/25/22 1700  Sun Oct 25, 2022  1530 CBC reviewed interpreted and significant for leukocytosis of 14,000 Hemoglobin is 12.7 [DR]  1531 Hemoglobin is decreased from first prior of [DR]  1650 CT abdomen pelvis reviewed and significant for diverticulitis complicated by perforation/abscess and adjacent mesentery measuring 6.8 x 4.7 x 5.2 cm with bowel wall thickening and associated inflammatory changes of the ascending colon likely reflecting inflammatory colitis this is a per radiologist interpretation [DR]  0600 Pleat metabolic panel reviewed interpreted significant for mild hyperglycemia at 133 Creatinine is 1.3 Alk phos 143 otherwise within normal limits [DR]    Clinical Course User Index [DR] Pattricia Boss, MD   Medical Decision Making Amount and/or Complexity of Data Reviewed Labs: ordered. Radiology: ordered.  Risk Prescription drug management. Decision regarding hospitalization.   Consult: Reviewed with Dr. Thermon Leyland gen surg.  Recommends hospitalist admission.         Charlesetta Shanks, MD 10/25/22 [phone]

## 2022-10-25 NOTE — Hospital Course (Signed)
Ian Estes is a 82 y.o. male with medical history significant for chronic atrial fibrillation on Eliquis, CKD stage IIIa, HTN, HLD, history of diverticulitis who is admitted with acute sigmoid diverticulitis with contained perforation/abscess.

## 2022-10-25 NOTE — Care Plan (Addendum)
82 year old male with past medical history of type 2 diabetes, prostate cancer, hypertension, hyperlipidemia presented with abdominal pain since 6 weeks.  Upon arrival to ED: Patient afebrile, pulse: 72, RR: 18, BP: 133/78, maintaining oxygen saturation on room air. CT abdomen/pelvis shows acute sigmoid diverticulitis complicated by contained perforation/abscess in the adjacent present treated which measures 6.8 x 4.7 x 5.2 cm.  Bowel wall thickening and associated inflammatory changes of the ascending colon likely reflecting infectious/inflammatory colitis.  Patient started on IV fluids and given Unasyn in the ED.  ED physician Dr. Ivin Booty surgery Dr. Thermon Leyland, who recommended to keep patient n.p.o., hold Eliquis and transfer patient to [facility] and hospitalist admission.

## 2022-10-25 NOTE — ED Notes (Signed)
LR held until anti biotics were completed.

## 2022-10-25 NOTE — ED Provider Notes (Signed)
Admit for diverticulitis with perf and abscess. Physical Exam  BP (!) 156/77 (BP Location: Right Arm)   Pulse 71   Temp 97.6 F (36.4 C)   Resp 16   SpO2 99%   Physical Exam  Procedures  Procedures  ED Course / MDM   Clinical Course as of 10/25/22 1700  Sun Oct 25, 2022  1530 CBC reviewed interpreted and significant for leukocytosis of 14,000 Hemoglobin is 12.7 [DR]  1531 Hemoglobin is decreased from first prior of [DR]  1650 CT abdomen pelvis reviewed and significant for diverticulitis complicated by perforation/abscess and adjacent mesentery measuring 6.8 x 4.7 x 5.2 cm with bowel wall thickening and associated inflammatory changes of the ascending colon likely reflecting inflammatory colitis this is a per radiologist interpretation [DR]  3818 Pleat metabolic panel reviewed interpreted significant for mild hyperglycemia at 133 Creatinine is 1.3 Alk phos 143 otherwise within normal limits [DR]    Clinical Course User Index [DR] Pattricia Boss, MD   Medical Decision Making Amount and/or Complexity of Data Reviewed Labs: ordered. Radiology: ordered.  Risk Prescription drug management. Decision regarding hospitalization.   Consult: Reviewed with Dr. Thermon Leyland gen surg.  Recommends hospitalist admission.         Charlesetta Shanks, MD 10/25/22 1652    Charlesetta Shanks, MD 10/28/22 1911

## 2022-10-25 NOTE — H&P (Signed)
History and Physical    [name] HER:[id number] DOB: [birth year] DOA: 10/25/2022  PCP: Aletha Halim., PA-C  Patient coming from: Home  I have personally briefly reviewed patient's old medical records in [place]  Chief Complaint: Abdominal pain  HPI: Ian Estes is a 82 y.o. male with medical history significant for chronic atrial fibrillation on Eliquis, CKD stage IIIa, HTN, HLD, history of diverticulitis who presents to the ED for evaluation of abdominal pain.  Patient states that he has been dealing with waxing and waning abdominal pain for about 6 weeks.  Yesterday he developed severe lower and left lower quadrant abdominal pain.  He was unable to sleep all night.  He has had nausea and some loose stools but no emesis or bloody bowel movements.  He denies fevers, chills, diaphoresis, chest pain, dyspnea.  He does take Eliquis chronically for history of A-fib.  Last dose of Eliquis was 10/24/22 AM per patient.  [facility] ED Course  Labs/Imaging on admission: I have personally reviewed following labs and imaging studies.  Initial vitals showed BP 133/70, pulse 72, RR 18, temp 97.6 F, SpO2 99% on room air.  Labs show WBC 14.0, hemoglobin 12.7, platelets 246,000, sodium 136, potassium 4.8, bicarb 23, BUN 15, creatinine 1.30, serum glucose 133, lipase <10.  Urinalysis negative for UTI.  Patient was given 500 cc LR, IV Unasyn, IV Dilaudid.  EDP discussed with general surgery, Dr. Thermon Leyland, who recommended to keep patient n.p.o., hold Eliquis, medical admission.  The hospitalist service was consulted to admit for further evaluation and management.  Review of Systems: All systems reviewed and are negative except as documented in history of present illness above.   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"   Diverticulitis    Gout    High  cholesterol    Hypertension    Obesity    Pneumonia 2014 X 1   Prostate cancer ([place])    Type II diabetes mellitus ([place])     Past Surgical History:  Procedure Laterality Date   INCISION AND DRAINAGE PERIRECTAL ABSCESS  1990's   "hemorrhoid"   KNEE ARTHROSCOPY Right ~ 2011   PROSTATE SURGERY  1996   SKIN CANCER EXCISION  ~ 2005 X 2   "left foot"   SUBXYPHOID PERICARDIAL WINDOW N/A 12/21/2018   Procedure: SUBXYPHOID PERICARDIAL WINDOW;  Surgeon: Ivin Poot, MD;  Location: [facility];  Service: Thoracic;  Laterality: N/A;   TEE WITHOUT CARDIOVERSION N/A 12/21/2018   Procedure: TRANSESOPHAGEAL ECHOCARDIOGRAM (TEE);  Surgeon: Prescott Gum, Collier Salina, MD;  Location: [facility] OR;  Service: Thoracic;  Laterality: N/A;    Social History:  reports that he quit smoking about 35 years ago. His smoking use included cigarettes. He has a 48.00 pack-year smoking history. He has never used smokeless tobacco. He reports current alcohol use of about 21.0 standard drinks of alcohol per week. He reports that he does not use drugs.  Not on File  Family History  Problem Relation Age of Onset   CAD Neg Hx      Prior to Admission medications   Medication Sig Start Date End Date Taking? Authorizing Provider  allopurinol (ZYLOPRIM) 100 MG tablet Take 100 mg by mouth daily.    [provider]  amLODipine (NORVASC) 5 MG tablet Take 5 mg by mouth daily. 05/20/20   [provider]  apixaban (ELIQUIS) 5 MG TABS tablet Take 1 tablet (5 mg total) by mouth 2 (two) times daily. 04/17/22   Minus Breeding, MD  atorvastatin (LIPITOR) 20 MG tablet Take 20 mg by mouth daily.    [provider]  clonazePAM (KLONOPIN) 0.5 MG tablet Take 0.5 mg by mouth at bedtime as needed (sleep).    [provider]  folic acid (FOLVITE) 1 MG tablet Take 1 tablet (1 mg total) by mouth daily. 12/25/18   Antony Odea, PA-C  metoprolol succinate (TOPROL-XL) 25 MG 24 hr tablet TAKE 1 TABLET BY MOUTH EVERY DAY  05/11/22   Minus Breeding, MD    Physical Exam: Vitals:   10/25/22 1930 10/25/22 1945 10/25/22 2022 10/25/22 2037  BP: 139/62 129/66 (!) 152/81   Pulse: 75 81 64   Resp: 20 (!) 22 18   Temp:      TempSrc:    Oral  SpO2: 96% 96% 100%   Weight:    95.8 kg  Height:    6' (1.829 m)   Constitutional: Elderly man resting supine in bed.  NAD, calm, comfortable Eyes: EOMI, lids and conjunctivae normal ENMT: Mucous membranes are moist. Posterior pharynx clear of any exudate or lesions.Normal dentition.  Neck: normal, supple, no masses. Respiratory: clear to auscultation bilaterally, no wheezing, no crackles. Normal respiratory effort. No accessory muscle use.  Cardiovascular: Irregularly irregular, no murmurs / rubs / gallops. No extremity edema. 2+ pedal pulses. Abdomen: LLQ tenderness, no masses palpated. Musculoskeletal: no clubbing / cyanosis. No joint deformity upper and lower extremities. Good ROM, no contractures. Normal muscle tone.  Skin: no rashes, lesions, ulcers. No induration Neurologic: Sensation intact. Strength 5/5 in all 4.  Psychiatric: Normal judgment and insight. Alert and oriented x 3. Normal mood.   EKG: Ordered and pending.  Assessment/Plan Principal Problem:   Diverticulitis of intestine with perforation and abscess Active Problems:   Chronic atrial fibrillation (HCC)   Chronic kidney disease, stage 3a (HCC)   Hypertension   Hyperlipidemia   Ian Estes is a 82 y.o. male with medical history significant for chronic atrial fibrillation on Eliquis, CKD stage IIIa, HTN, HLD, history of diverticulitis who is admitted with acute sigmoid diverticulitis with contained perforation/abscess.  Assessment and Plan:  Acute sigmoid diverticulitis with contained perforation/abscess: Hemodynamically stable.  General surgery to consult. -Keep n.p.o. -IV ceftriaxone and Flagyl -Continue IV fluids overnight -Analgesics and antiemetics as needed -Hold Eliquis (last took  1/6 AM per patient)  Ascending colitis: Findings suggestive of ascending colitis also noted on CT.  Reports some loose nonbloody bowel movements. -Continue management as above  Chronic atrial fibrillation: Rate controlled.  Holding Eliquis for potential surgical or IR intervention.  Holding Toprol-XL while NPO.  IV metoprolol 5 mg ordered q6h with hold parameters.  CKD stage IIIa: Renal function stable.  Hypertension: Home amlodipine and Toprol-XL on hold while NPO.  Hyperlipidemia: Holding statin while NPO.   DVT prophylaxis: SCDs Start: 10/25/22 2056 Code Status: Full code, confirmed with patient on admission Family Communication: Discussed with patient, he has discussed with family Disposition Plan: From home and likely discharge to home pending clinical progress Consults called: General surgery  Severity of Illness: The appropriate patient status for this patient is INPATIENT. Inpatient status is judged to be reasonable and necessary in order to provide the required intensity of service to ensure the patient's safety. The patient's presenting symptoms, physical exam findings, and initial radiographic and laboratory data in the context of their chronic  comorbidities is felt to place them at high risk for further clinical deterioration. Furthermore, it is not anticipated that the patient will be medically stable for discharge from the hospital within 2 midnights of admission.   * I certify that at the point of admission it is my clinical judgment that the patient will require inpatient hospital care spanning beyond 2 midnights from the point of admission due to high intensity of service, high risk for further deterioration and high frequency of surveillance required.Zada Finders MD [facility]  If 7PM-7AM, please contact night-coverage [email or web address]  10/25/2022, 9:46 PM

## 2022-10-25 NOTE — ED Notes (Signed)
Called [facility] and spoke to [place]; informed that the patient Bed Assignment is READY

## 2022-10-25 NOTE — ED Provider Notes (Addendum)
[facility] EMERGENCY DEPT Provider Note   CSN: [id number] Arrival date & time: 10/25/22  1218     History  Chief Complaint  Patient presents with   Abdominal Pain    Ian Estes is a 82 y.o. male.  HPI 82 year old male presents today complaining of abdominal pain.  He states he has a history of diverticulitis.  He has been having ongoing abdominal pain for 6 weeks.  He states it has waxed and waned.  He has been seen by his gastroenterologist and primary care.  He reports he had labs obtained that were normal but pain has persisted.  Now pain has worsened over the past several days.  He has been unable to tolerate liquids today due to poor appetite and nausea but has not had active vomiting or diarrhea.  He has not noted fever or chills.    Home Medications Prior to Admission medications   Medication Sig Start Date End Date Taking? Authorizing Provider  allopurinol (ZYLOPRIM) 100 MG tablet Take 100 mg by mouth daily.    [provider]  amLODipine (NORVASC) 5 MG tablet Take 5 mg by mouth daily. 05/20/20   [provider]  apixaban (ELIQUIS) 5 MG TABS tablet Take 1 tablet (5 mg total) by mouth 2 (two) times daily. 04/17/22   Minus Breeding, MD  atorvastatin (LIPITOR) 20 MG tablet Take 20 mg by mouth daily.    [provider]  clonazePAM (KLONOPIN) 0.5 MG tablet Take 0.5 mg by mouth at bedtime as needed (sleep).    [provider]  folic acid (FOLVITE) 1 MG tablet Take 1 tablet (1 mg total) by mouth daily. 12/25/18   Antony Odea, PA-C  metoprolol succinate (TOPROL-XL) 25 MG 24 hr tablet TAKE 1 TABLET BY MOUTH EVERY DAY 05/11/22   Minus Breeding, MD      Allergies    Patient has no allergy information on record.    Review of Systems   Review of Systems  Physical Exam Updated Vital Signs BP (!) 156/77 (BP Location: Right Arm)   Pulse 71   Temp 97.6 F (36.4 C)   Resp 16   SpO2 99%  Physical Exam Vitals and nursing  note reviewed.  HENT:     Head: Normocephalic.     Mouth/Throat:     Mouth: Mucous membranes are moist.  Eyes:     Extraocular Movements: Extraocular movements intact.  Cardiovascular:     Rate and Rhythm: Normal rate and regular rhythm.  Pulmonary:     Effort: Pulmonary effort is normal.     Breath sounds: Normal breath sounds.  Abdominal:     General: Abdomen is flat. Bowel sounds are normal.     Palpations: Abdomen is soft.     Tenderness: There is abdominal tenderness in the right lower quadrant and suprapubic area.  Skin:    General: Skin is warm.     Capillary Refill: Capillary refill takes less than 2 seconds.  Neurological:     General: No focal deficit present.     Mental Status: He is alert.  Psychiatric:        Mood and Affect: Mood normal.     ED Results / Procedures / Treatments   Labs (all labs ordered are listed, but only abnormal results are displayed) Labs Reviewed  CBC - Abnormal; Notable for the following components:      Result Value   WBC 14.0 (*)    Hemoglobin 12.7 (*)    All  other components within normal limits  COMPREHENSIVE METABOLIC PANEL - Abnormal; Notable for the following components:   Glucose, Bld 133 (*)    Creatinine, Ser 1.30 (*)    Alkaline Phosphatase 143 (*)    GFR, Estimated 55 (*)    All other components within normal limits  LIPASE, BLOOD - Abnormal; Notable for the following components:   Lipase <10 (*)    All other components within normal limits  URINALYSIS, ROUTINE W REFLEX MICROSCOPIC - Abnormal; Notable for the following components:   Ketones, ur 15 (*)    All other components within normal limits    EKG None  Radiology CT ABDOMEN PELVIS W CONTRAST  Result Date: 10/25/2022 CLINICAL DATA:  Acute abdominal pain. EXAM: CT ABDOMEN AND PELVIS WITH CONTRAST TECHNIQUE: Multidetector CT imaging of the abdomen and pelvis was performed using the standard protocol following bolus administration of intravenous contrast.  RADIATION DOSE REDUCTION: This exam was performed according to the departmental dose-optimization program which includes automated exposure control, adjustment of the mA and/or kV according to patient size and/or use of iterative reconstruction technique. CONTRAST:  179m OMNIPAQUE IOHEXOL 300 MG/ML  SOLN COMPARISON:  CT abdomen pelvis dated 12/20/2018. FINDINGS: Lower chest: No acute abnormality. Hepatobiliary: No focal liver abnormality is seen. No gallstones, gallbladder wall thickening, or biliary dilatation. Pancreas: Unremarkable. No pancreatic ductal dilatation or surrounding inflammatory changes. Spleen: Normal in size without focal abnormality. Adrenals/Urinary Tract: Adrenal glands are unremarkable. Other than a 1.7 cm cyst in the left kidney, the kidneys are normal, without renal calculi, solid focal lesion, or hydronephrosis. No imaging follow-up is recommended for the left renal cyst. Bladder is unremarkable. Stomach/Bowel: Stomach is within normal limits. There is sigmoid diverticulosis with superimposed bowel wall thickening and inflammatory changes with a contained perforation/abscess in the adjacent mesentery which measures 6.8 x 4.7 x 5.2 cm (series 5, image 69 and series 2, image 73). There is bowel wall thickening and mild associated fat stranding involving the ascending colon (series 2, images 63-71) the appendix appears normal. Evidence of bowel obstruction. Vascular/Lymphatic: Aortic atherosclerosis. An enlarged lymph node in the mesentery is likely reactive (series 5, image 75). Reproductive: The prostate is absent. Other: No abdominal wall hernia or abnormality. No abdominopelvic ascites. Musculoskeletal: Degenerative changes are seen in the spine. IMPRESSION: 1. Acute sigmoid diverticulitis complicated by a contained perforation/abscess in the adjacent mesentery which measures 6.8 x 4.7 x 5.2 cm. 2. Bowel wall thickening and associated inflammatory changes of the ascending colon likely  reflect an infectious/inflammatory colitis. Aortic Atherosclerosis (ICD10-I70.0). Electronically Signed   By: TZerita BoersM.D.   On: 10/25/2022 16:45    Procedures Procedures    Medications Ordered in ED Medications  Ampicillin-Sulbactam (UNASYN) 3 g in sodium chloride 0.9 % 100 mL IVPB (has no administration in time range)  lactated ringers bolus 500 mL (0 mLs Intravenous Stopped 10/25/22 1556)  fentaNYL (SUBLIMAZE) injection 25 mcg (25 mcg Intravenous Given 10/25/22 1507)  iohexol (OMNIPAQUE) 300 MG/ML solution 100 mL (100 mLs Intravenous Contrast Given 10/25/22 1602)    ED Course/ Medical Decision Making/ A&P Clinical Course as of 10/25/22 1659  Sun Oct 25, 2022  1530 CBC reviewed interpreted and significant for leukocytosis of 14,000 Hemoglobin is 12.7 [DR]  1531 Hemoglobin is decreased from first prior of [DR]  1650 CT abdomen pelvis reviewed and significant for diverticulitis complicated by perforation/abscess and adjacent mesentery measuring 6.8 x 4.7 x 5.2 cm with bowel wall thickening and associated inflammatory changes of the ascending  colon likely reflecting inflammatory colitis this is a per radiologist interpretation [DR]  9150 Pleat metabolic panel reviewed interpreted significant for mild hyperglycemia at 133 Creatinine is 1.3 Alk phos 143 otherwise within normal limits [DR]    Clinical Course User Index [DR] Pattricia Boss, MD                           Medical Decision Making Amount and/or Complexity of Data Reviewed Labs: ordered. Radiology: ordered.  Risk Prescription drug management. Decision regarding hospitalization.   This patient presents to the ED for concern of abdominal pain, this involves an extensive number of treatment options, and is a complaint that carries with it a high risk of complications and morbidity.  The differential diagnosis includes but is not limited to diverticulitis with perforation, appendicitis, UTI, perforated viscus, other diseases  of the urinary collecting system   Co morbidities that complicate the patient evaluation  Chronic anticoagulation with Eliquis   Additional history obtained:  Additional history obtained from review of outpatient record External records from outside source obtained and reviewed including reviewed recent H. pylori antigen in stool which is negative   Lab Tests:  I Ordered, and personally interpreted labs.  The pertinent results include: Reviewed complete blood cell count and complete metabolic panel significant for some CKD, mild hyperglycemia, and leukocytosis   Imaging Studies ordered:  I ordered imaging studies including CT abdomen pelvis I independently visualized and interpreted imaging which showed diverticulitis and abscess I agree with the radiologist interpretation   Cardiac Monitoring: / EKG:  The patient was maintained on a cardiac monitor.  I personally viewed and interpreted the cardiac monitored which showed an underlying rhythm of: Patient's heart rate is 70 and blood pressure has remained stable at 156/70 with normal respiratory rate and saturations   Consultations Obtained:  I requested consultation with the Discussed with Dr. Vallery Ridge,  and discussed lab and imaging findings as well as pertinent plan - they recommend: He has assumed care and will disposition to general surgery Care discussed with Dr. Doristine Bosworth, on-call for hospitalist  Problem List / ED Course / Critical interventions / Medication management  Diverticulitis with perforation I ordered medication including Unasyn for infection Reevaluation of the patient after these medicines showed that the patient stayed the same I have reviewed the patients home medicines and have made adjustments as needed   Social Determinants of Health:  Patient's age   Test / Admission - Considered:  Considered multiple intra-abdominal etiologies and etiologies of prostate Patient was evaluated with labs Patient  was evaluated with CT scan Is receiving IV antibiotics and will consult to general surgery for admission         Final Clinical Impression(s) / ED Diagnoses Final diagnoses:  Diverticulitis of colon with perforation    Rx / DC Orders ED Discharge Orders     None         Pattricia Boss, MD 10/25/22 1659    Pattricia Boss, MD 10/25/22 1723

## 2022-10-25 NOTE — ED Triage Notes (Signed)
Pt had blood work 2 weeks ago at primary.

## 2022-10-26 ENCOUNTER — Encounter (HOSPITAL_COMMUNITY): Payer: Self-pay | Admitting: Internal Medicine

## 2022-10-26 DIAGNOSIS — N1831 Chronic kidney disease, stage 3a: Secondary | ICD-10-CM | POA: Diagnosis not present

## 2022-10-26 DIAGNOSIS — I482 Chronic atrial fibrillation, unspecified: Secondary | ICD-10-CM

## 2022-10-26 DIAGNOSIS — K5720 Diverticulitis of large intestine with perforation and abscess without bleeding: Secondary | ICD-10-CM | POA: Diagnosis not present

## 2022-10-26 LAB — BASIC METABOLIC PANEL
Anion gap: 7 (ref 5–15)
BUN: 16 mg/dL (ref 8–23)
CO2: 22 mmol/L (ref 22–32)
Calcium: 8.1 mg/dL — ABNORMAL LOW (ref 8.9–10.3)
Chloride: 104 mmol/L (ref 98–111)
Creatinine, Ser: 1.35 mg/dL — ABNORMAL HIGH (ref 0.61–1.24)
GFR, Estimated: 52 mL/min — ABNORMAL LOW (ref >60)
Glucose, Bld: 139 mg/dL — ABNORMAL HIGH (ref 70–99)
Potassium: 4.1 mmol/L (ref 3.5–5.1)
Sodium: 133 mmol/L — ABNORMAL LOW (ref 135–145)

## 2022-10-26 LAB — CBC
HCT: 36.1 % — ABNORMAL LOW (ref 39.0–52.0)
Hemoglobin: 11.7 g/dL — ABNORMAL LOW (ref 13.0–17.0)
MCH: 27.9 pg (ref 26.0–34.0)
MCHC: 32.4 g/dL (ref 30.0–36.0)
MCV: 86 fL (ref 80.0–100.0)
Platelets: 217 10*3/uL (ref 150–400)
RBC: 4.2 MIL/uL — ABNORMAL LOW (ref 4.22–5.81)
RDW: 14.3 % (ref 11.5–15.5)
WBC: 15.3 10*3/uL — ABNORMAL HIGH (ref 4.0–10.5)
nRBC: 0 % (ref 0.0–0.2)

## 2022-10-26 LAB — GLUCOSE, CAPILLARY: Glucose-Capillary: 126 mg/dL — ABNORMAL HIGH (ref 70–99)

## 2022-10-26 MED ORDER — CLONAZEPAM 0.125 MG PO TBDP
0.5000 mg | ORAL_TABLET | Freq: Every day | ORAL | Status: DC
  Administered 2022-10-26 – 2022-10-30 (×5): 0.5 mg via ORAL
  Filled 2022-10-26 (×5): qty 4

## 2022-10-26 MED ORDER — PIPERACILLIN-TAZOBACTAM 3.375 G IVPB
3.3750 g | Freq: Three times a day (TID) | INTRAVENOUS | Status: DC
  Administered 2022-10-26 – 2022-10-30 (×12): 3.375 g via INTRAVENOUS
  Filled 2022-10-26 (×11): qty 50

## 2022-10-26 MED ORDER — CLONAZEPAM 0.5 MG PO TABS: 0.5000 mg | ORAL_TABLET | Freq: Every day | ORAL | Status: DC

## 2022-10-26 MED ORDER — PIPERACILLIN-TAZOBACTAM 3.375 G IVPB 30 MIN: 3.3750 g | Freq: Three times a day (TID) | INTRAVENOUS | Status: DC

## 2022-10-26 NOTE — Progress Notes (Signed)
  Progress Note   Patient: Ian Estes MRA:[id number] DOB: 01-26-1941 DOA: 10/25/2022     1 DOS: the patient was seen and examined on 10/26/2022   Brief hospital course: 82 year old male PMH including atrial fibrillation on apixaban, diverticulitis, presented with abdominal pain, fluid for acute sigmoid diverticulitis with contained perforation/abscess.  Assessment and Plan: Acute sigmoid diverticulitis with contained perforation/abscess: Possible inflammatory colitis. CT abd/pelvis Acute sigmoid diverticulitis complicated by a contained perforation/abscess. Imaging suggests infectious/inflammatory colitis. Continue empiric antibiotics.  Pain currently controlled.  Appreciate general surgery.  Plan for interventional radiology drain placement 1/9.   GI follow-up as an outpatient.  Chronic atrial fibrillation: Rate controlled.  Holding Eliquis until follow-up procedures complete.  Holding Toprol-XL while NPO.  IV metoprolol 5 mg ordered q6h with hold parameters.   CKD stage IIIa: Creatinine 1.3 > 1.35; baseline about 1.1-1.2, CKD stage IIIa   Essential hypertension: Home amlodipine and Toprol-XL while NPO.   Hyperlipidemia: Holding statin while NPO.  Aortic Atherosclerosis (ICD10-I70.0). Statin  Gout Allopurinol   Modest hyponatremia.  Monitor.     Subjective:  Feels ok right now. Pain is controlled.  Physical Exam: Vitals:   10/25/22 2329 10/26/22 0457 10/26/22 0742 10/26/22 1436  BP: (!) 145/60 (!) 110/48 133/66 135/73  Pulse: 69 66 71 75  Resp:  '18 18 18  '$ Temp:  97.9 F (36.6 C) 99.6 F (37.6 C) 98.1 F (36.7 C)  TempSrc:   Oral   SpO2:  97% 99% 99%  Weight:      Height:       Physical Exam Vitals reviewed.  Constitutional:      General: He is not in acute distress.    Appearance: He is not ill-appearing or toxic-appearing.  Cardiovascular:     Rate and Rhythm: Normal rate and regular rhythm.     Heart sounds: No murmur heard. Pulmonary:     Effort:  Pulmonary effort is normal. No respiratory distress.     Breath sounds: No wheezing, rhonchi or rales.  Neurological:     Mental Status: He is alert.  Psychiatric:        Mood and Affect: Mood normal.        Behavior: Behavior normal.     Data Reviewed: CBG stable Na+ 133 Creatinine 1.3 > 1.35; baseline about 1.1-1.2, CKD stage IIIa WBC 14 > 15.3 BC pending CT abd/pelvis Acute sigmoid diverticulitis complicated by a contained perforation/abscess. Imaging suggests infectious/inflammatory colitis. Aortic Atherosclerosis (ICD10-I70.0). EKG SR  Family Communication: none  Disposition: Status is: Inpatient Remains inpatient appropriate because: perforated diverticulitis  Planned Discharge Destination: Home    Time spent: 25 minutes  Author: Murray Hodgkins, MD 10/26/2022 3:54 PM  For on call review [email or web address]. Pending stability.

## 2022-10-26 NOTE — Consult Note (Signed)
[facility] Surgery Consult Note  Candy Ziegler 1940/12/13  [id number].    Requesting MD: Murray Hodgkins Chief Complaint/Reason for Consult: diverticulitis   HPI:  Ian Estes is a 82 y.o. male PMH A fib on eliquis (last dose 1/6), HTN, HLD, CKD-IIIa who presented to the ED yesterday with worsening abdominal pain. States that he has been dealing with waxing and waning symptoms for 2-3 months. Initially thought he had eaten some bad shrimp. He saw his gastroenterologist who thought he had GERD and prescribed protonix but this did not help. His pain became acutely worse 2 days ago, and was located mostly in the lower abdomen. Some nausea, no emesis. Denies fever, chills, diarrhea, or blood in stool. Last BM was 2-3 days ago and normal.  In the ED patient was hypertensive, otherwise VSS. WBC 14>>15.3. CT abdomen/pelvis with acute sigmoid diverticulitis complicated by a contained perforation/ abscess in the adjacent mesentery which measures 6.8 x 4.7 x 5.2 cm; bowel wall thickening and associated inflammatory changes of the ascending colon likely reflect an infectious/inflammatory colitis.  Patient was admitted to the medical service and was started on IV rocephin/flagyl. General surgery asked to see.  Patient states that he had 1 prior episode of diverticulitis about 5 years ago requiring admission. This resolved with a course of antibiotics. His last colonoscopy was about 7 years ago with Dr. Collene Mares, patient reports it was normal.  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"   Diverticulitis    Gout     High cholesterol    Hypertension    Obesity    Pneumonia 2014 X 1   Prostate cancer ([place])    Type II diabetes mellitus ([place])     Past Surgical History:  Procedure Laterality Date   INCISION AND DRAINAGE PERIRECTAL ABSCESS  1990's   "hemorrhoid"   KNEE ARTHROSCOPY Right ~ 2011   PROSTATE SURGERY  1996   SKIN CANCER EXCISION  ~ 2005 X 2   "left foot"   SUBXYPHOID PERICARDIAL WINDOW N/A 12/21/2018   Procedure: SUBXYPHOID PERICARDIAL WINDOW;  Surgeon: Ivin Poot, MD;  Location: [facility];  Service: Thoracic;  Laterality: N/A;   TEE WITHOUT CARDIOVERSION N/A 12/21/2018   Procedure: TRANSESOPHAGEAL ECHOCARDIOGRAM (TEE);  Surgeon: Prescott Gum, Collier Salina, MD;  [facility] OR;  Service: Thoracic;  Laterality: N/A;    Social History:  reports that he quit smoking about 35 years ago. His smoking use included cigarettes. He has a 48.00 pack-year smoking history. He has never used smokeless tobacco. He reports current alcohol use of about 21.0 standard drinks of alcohol per week. He reports that he does not use drugs.  Allergies: No Known Allergies  Medications Prior to Admission  Medication Sig Dispense Refill   allopurinol (ZYLOPRIM) 100 MG tablet Take 100 mg by mouth daily.     amLODipine (NORVASC) 5 MG tablet Take 5 mg by mouth daily.     atorvastatin (LIPITOR) 20 MG tablet Take 20 mg by mouth daily.     clonazePAM (KLONOPIN) 0.5 MG tablet Take 0.5 mg by  mouth daily.     folic acid (FOLVITE) 1 MG tablet Take 1 tablet (1 mg total) by mouth daily. 30 tablet 1   metoprolol succinate (TOPROL-XL) 25 MG 24 hr tablet TAKE 1 TABLET BY MOUTH EVERY DAY (Patient taking differently: Take 25 mg by mouth daily.) 90 tablet 1   pantoprazole (PROTONIX) 40 MG tablet Take 40 mg by mouth daily.     apixaban (ELIQUIS) 5 MG TABS tablet Take 1 tablet (5 mg total) by mouth 2 (two) times daily. (Patient not taking: Reported on 10/26/2022) 180 tablet 1    Prior to Admission medications   Medication Sig Start Date End  Date Taking? Authorizing Provider  allopurinol (ZYLOPRIM) 100 MG tablet Take 100 mg by mouth daily.   Yes [provider]  amLODipine (NORVASC) 5 MG tablet Take 5 mg by mouth daily. 05/20/20  Yes [provider]  atorvastatin (LIPITOR) 20 MG tablet Take 20 mg by mouth daily.   Yes [provider]  clonazePAM (KLONOPIN) 0.5 MG tablet Take 0.5 mg by mouth daily.   Yes [provider]  folic acid (FOLVITE) 1 MG tablet Take 1 tablet (1 mg total) by mouth daily. 12/25/18  Yes Roddenberry, Arlis Porta, PA-C  metoprolol succinate (TOPROL-XL) 25 MG 24 hr tablet TAKE 1 TABLET BY MOUTH EVERY DAY Patient taking differently: Take 25 mg by mouth daily. 05/11/22  Yes Minus Breeding, MD  pantoprazole (PROTONIX) 40 MG tablet Take 40 mg by mouth daily. 09/24/22  Yes [provider]  apixaban (ELIQUIS) 5 MG TABS tablet Take 1 tablet (5 mg total) by mouth 2 (two) times daily. Patient not taking: Reported on 10/26/2022 04/17/22   Minus Breeding, MD    Blood pressure 133/66, pulse 71, temperature 99.6 F (37.6 C), temperature source Oral, resp. rate 18, height 6' (1.829 m), weight 95.8 kg, SpO2 99 %. Physical Exam: General: pleasant, elderly male who is laying in bed in NAD HEENT: head is normocephalic, atraumatic.  Sclera are noninjected.  Pupils equal and round.  Ears and nose without any masses or lesions.  Mouth is pink and moist. Dentition fair Heart: irregular  Lungs: CTAB, no wheezes, rhonchi, or rales noted.  Respiratory effort nonlabored on room air Abd: well healed lower midline incision, soft, ND, +BS, no masses, hernias, or organomegaly. TTP across the lower abdomen LLQ>RLQ without rebound or guarding, no peritonitis  Skin: warm and dry with no masses, lesions, or rashes Psych: A&Ox4 with an appropriate affect Neuro: MAEs, no gross motor or sensory deficits BUE/BLE  Results for orders placed or performed during the hospital encounter of 10/25/22 (from the past 48  hour(s))  CBC     Status: Abnormal   Collection Time: 10/25/22  2:25 PM  Result Value Ref Range   WBC 14.0 (H) 4.0 - 10.5 K/uL   RBC 4.63 4.22 - 5.81 MIL/uL   Hemoglobin 12.7 (L) 13.0 - 17.0 g/dL   HCT 39.6 39.0 - 52.0 %   MCV 85.5 80.0 - 100.0 fL   MCH 27.4 26.0 - 34.0 pg   MCHC 32.1 30.0 - 36.0 g/dL   RDW 14.2 11.5 - 15.5 %   Platelets 246 150 - 400 K/uL   nRBC 0.0 0.0 - 0.2 %    Comment: Performed at [facility], [address], [place], [place] [address]  Comprehensive metabolic panel     Status: Abnormal   Collection Time: 10/25/22  2:25 PM  Result Value Ref Range   Sodium 136 135 -  145 mmol/L   Potassium 4.8 3.5 - 5.1 mmol/L   Chloride 100 98 - 111 mmol/L   CO2 23 22 - 32 mmol/L   Glucose, Bld 133 (H) 70 - 99 mg/dL    Comment: Glucose reference range applies only to samples taken after fasting for at least 8 hours.   BUN 15 8 - 23 mg/dL   Creatinine, Ser 1.30 (H) 0.61 - 1.24 mg/dL   Calcium 9.0 8.9 - 10.3 mg/dL   Total Protein 7.1 6.5 - 8.1 g/dL   Albumin 3.5 3.5 - 5.0 g/dL   AST 16 15 - 41 U/L   ALT 9 0 - 44 U/L   Alkaline Phosphatase 143 (H) 38 - 126 U/L   Total Bilirubin 0.9 0.3 - 1.2 mg/dL   GFR, Estimated 55 (L) >60 mL/min    Comment: (NOTE) Calculated using the CKD-EPI Creatinine Equation (2021)    Anion gap 13 5 - 15    Comment: Performed at [facility], [place], [place] [address]  Lipase, blood     Status: Abnormal   Collection Time: 10/25/22  2:25 PM  Result Value Ref Range   Lipase <10 (L) 11 - 51 U/L    Comment: Performed at [facility], [address] [place], [place] [address]  Urinalysis, Routine w reflex microscopic Urine, Clean Catch     Status: Abnormal   Collection Time: 10/25/22  2:25 PM  Result Value Ref Range   Color, Urine YELLOW YELLOW   APPearance CLEAR CLEAR   Specific Gravity, Urine 1.018 1.005 - 1.030   pH 6.5 5.0 - 8.0   Glucose, UA NEGATIVE NEGATIVE  mg/dL   Hgb urine dipstick NEGATIVE NEGATIVE   Bilirubin Urine NEGATIVE NEGATIVE   Ketones, ur 15 (A) NEGATIVE mg/dL   Protein, ur NEGATIVE NEGATIVE mg/dL   Nitrite NEGATIVE NEGATIVE   Leukocytes,Ua NEGATIVE NEGATIVE    Comment: Performed at [facility], [address], [place], [place] [address]  Culture, blood (Routine X 2) w Reflex to ID Panel     Status: None (Preliminary result)   Collection Time: 10/25/22  9:08 PM   Specimen: BLOOD  Result Value Ref Range   Specimen Description      BLOOD BLOOD RIGHT ARM forearm Performed at [facility], [place] [address], [place], [place] [address]    Special Requests      BOTTLES DRAWN AEROBIC AND ANAEROBIC Blood Culture results may not be optimal due to an inadequate volume of blood received in culture bottles Performed at [facility], [place] [address] [place], [place] [address]    Culture      NO GROWTH < 12 HOURS Performed at [facility] [address], [place], [place] [address]    Report Status PENDING   Culture, blood (Routine X 2) w Reflex to ID Panel     Status: None (Preliminary result)   Collection Time: 10/25/22  9:08 PM   Specimen: BLOOD  Result Value Ref Range   Specimen Description      BLOOD BLOOD RIGHT HAND Performed at [facility] [address] [place], [place] [address]    Special Requests      BOTTLES DRAWN AEROBIC AND ANAEROBIC Blood Culture adequate volume Performed at [facility] [address], [place], [place] [address]    Culture      NO GROWTH < 12 HOURS Performed at [facility], [address] [place], [place] [address]    Report Status PENDING   CBC     Status: Abnormal   Collection Time: 10/26/22  3:46 AM  Result Value Ref Range   WBC 15.3 (H) 4.0 - 10.5 K/uL   RBC 4.20 (L) 4.22 - 5.81 MIL/uL   Hemoglobin 11.7 (L) 13.0 - 17.0 g/dL   HCT 36.1 (L) 39.0 - 52.0 %   MCV 86.0 80.0 - 100.0  fL   MCH 27.9 26.0 - 34.0 pg   MCHC 32.4 30.0 - 36.0 g/dL   RDW 14.3 11.5 - 15.5 %   Platelets 217 150 - 400 K/uL   nRBC 0.0 0.0 - 0.2 %    Comment: Performed at [facility], [place] [address] [place],  [address]  Basic metabolic panel     Status: Abnormal   Collection Time: 10/26/22  3:46 AM  Result Value Ref Range   Sodium 133 (L) 135 - 145 mmol/L   Potassium 4.1 3.5 - 5.1 mmol/L   Chloride 104 98 - 111 mmol/L   CO2 22 22 - 32 mmol/L   Glucose, Bld 139 (H) 70 - 99 mg/dL    Comment: Glucose reference range applies only to samples taken after fasting for at least 8 hours.   BUN 16 8 - 23 mg/dL   Creatinine, Ser 1.35 (H) 0.61 - 1.24 mg/dL   Calcium 8.1 (L) 8.9 - 10.3 mg/dL   GFR, Estimated 52 (L) >60 mL/min    Comment: (NOTE) Calculated using the CKD-EPI Creatinine Equation (2021)    Anion gap 7 5 - 15    Comment: Performed at [facility], [place] [address] [place],  [address]  Glucose, capillary     Status: Abnormal   Collection Time: 10/26/22  7:41 AM  Result Value Ref Range   Glucose-Capillary 126 (H) 70 - 99 mg/dL    Comment: Glucose reference range applies only to samples taken after fasting for at least 8 hours.   Comment 1 Notify RN    Comment 2 Document in Chart    CT ABDOMEN PELVIS W CONTRAST  Result Date: 10/25/2022 CLINICAL DATA:  Acute abdominal pain. EXAM: CT ABDOMEN AND PELVIS WITH CONTRAST TECHNIQUE: Multidetector CT imaging of the abdomen and pelvis was performed using the standard protocol following bolus administration of intravenous contrast. RADIATION DOSE REDUCTION: This exam was performed according to the departmental dose-optimization program which includes automated exposure control, adjustment of the mA and/or kV according to patient size and/or use of iterative reconstruction technique. CONTRAST:  110m OMNIPAQUE IOHEXOL 300 MG/ML  SOLN COMPARISON:  CT abdomen pelvis dated 12/20/2018. FINDINGS: Lower chest:  No acute abnormality. Hepatobiliary: No focal liver abnormality is seen. No gallstones, gallbladder wall thickening, or biliary dilatation. Pancreas: Unremarkable. No pancreatic ductal dilatation or surrounding inflammatory changes. Spleen: Normal in size without focal abnormality. Adrenals/Urinary Tract: Adrenal glands are unremarkable. Other than a 1.7 cm cyst in the left kidney, the kidneys are normal, without renal calculi, solid focal lesion, or hydronephrosis. No imaging follow-up is recommended for the left renal cyst. Bladder is unremarkable. Stomach/Bowel: Stomach is within normal limits. There is sigmoid diverticulosis with superimposed bowel wall thickening and inflammatory changes with a contained perforation/abscess in the adjacent mesentery which measures 6.8 x 4.7 x 5.2 cm (series 5, image 69 and series 2, image 73). There is bowel wall thickening and mild associated fat stranding involving the ascending colon (series 2, images 63-71) the appendix appears normal. Evidence  of bowel obstruction. Vascular/Lymphatic: Aortic atherosclerosis. An enlarged lymph node in the mesentery is likely reactive (series 5, image 75). Reproductive: The prostate is absent. Other: No abdominal wall hernia or abnormality. No abdominopelvic ascites. Musculoskeletal: Degenerative changes are seen in the spine. IMPRESSION: 1. Acute sigmoid diverticulitis complicated by a contained perforation/abscess in the adjacent mesentery which measures 6.8 x 4.7 x 5.2 cm. 2. Bowel wall thickening and associated inflammatory changes of the ascending colon likely reflect an infectious/inflammatory colitis. Aortic Atherosclerosis (ICD10-I70.0). Electronically Signed   By: Zerita Boers M.D.   On: 10/25/2022 16:45    Anti-infectives (From admission, onward)    Start     Dose/Rate Route Frequency Ordered Stop   10/26/22 1400  piperacillin-tazobactam (ZOSYN) IVPB 3.375 g  Status:  Discontinued        3.375 g 100 mL/hr over 30 Minutes  Intravenous Every 8 hours 10/26/22 0935 10/26/22 0940   10/26/22 1200  piperacillin-tazobactam (ZOSYN) IVPB 3.375 g        3.375 g 12.5 mL/hr over 240 Minutes Intravenous Every 8 hours 10/26/22 0940     10/25/22 2200  cefTRIAXone (ROCEPHIN) 2 g in sodium chloride 0.9 % 100 mL IVPB  Status:  Discontinued        2 g 200 mL/hr over 30 Minutes Intravenous Every 24 hours 10/25/22 2059 10/26/22 0935   10/25/22 2200  metroNIDAZOLE (FLAGYL) IVPB 500 mg  Status:  Discontinued        500 mg 100 mL/hr over 60 Minutes Intravenous Every 12 hours 10/25/22 2059 10/26/22 0935   10/25/22 1700  Ampicillin-Sulbactam (UNASYN) 3 g in sodium chloride 0.9 % 100 mL IVPB        3 g 200 mL/hr over 30 Minutes Intravenous  Once 10/25/22 1651 10/25/22 1806        Assessment/Plan Sigmoid diverticulitis with contained perforation/ abscess - 2nd bout requiring admission, first bout was 5 years ago and resolved with antibiotics. Last colonoscopy was about 7 years ago - CT abdomen/pelvis 1/7 with acute sigmoid diverticulitis complicated by a contained perforation/ abscess in the adjacent mesentery which measures 6.8 x 4.7 x 5.2 cm; bowel wall thickening and associated inflammatory changes of the ascending colon likely reflect an infectious/inflammatory colitis - No indication for acute surgical intervention. Will ask IR to review for possible drain placement. Antibiotics switched to zosyn given this is a complicated case of diverticulitis. Hopefully this will resolve without surgery, but if he does not improve will have to consider colectomy/colostomy this admission. If he gets better would recommend follow up with GI as outpatient. Ok for clear liquids after IR recommendations.  ID - rocephin/flagyl 1/7>>1/8, zosyn 1/8>> VTE - SCDs, hold eliquis >> ok for heparin gtt from surgical standpoint after IR recommendations  FEN - IVF, NPO Foley - none  A fib on eliquis (last dose 1/6) Hx pericardial effusion s/p pericardial  window 12/22/2018 HTN HLD CKD-IIIa  I reviewed hospitalist notes, last 24 h vitals and pain scores, last 48 h intake and output, last 24 h labs and trends, and last 24 h imaging results.   Wellington [place], [facility] 10/26/2022, 9:34 AM Please see Amion for pager number during day hours 7:00am-4:30pm

## 2022-10-26 NOTE — Consult Note (Signed)
Chief Complaint: Patient was seen in consultation today for diverticular perforation/abscess at the request of Margie Billet, [place] Referring Physician(s): Margie Billet, [place] Supervising Physician: Jacqulynn Cadet  Patient Status: [facility] - In-pt  History of Present Illness: Ian Estes is a 82 y.o. male with PMH significant for chronic A-fib on Eliquis, CKD stage IIIa, HTN, HLD and history of diverticulitis.  Patient presented to ED 10/25/2022 complaining of abdominal pain that has been waxing and waning for approximately 6 weeks.  Prior to arrival patient developed severe lower left quadrant pain, nausea and loose stools.  Patient was found to have leukocytosis, Hgb 12.7, platelets 246,000, BUN 15, creatinine 1.30.  UA negative for UTI.  Further workup included CT AP that revealed sigmoid diverticulosis with superimposed bowel wall thickening and inflammatory changes with perforation/abscess.  Surgery was consulted and then referred patient to IR requesting intra-abdominal drain placement. Imaging was reviewed and procedure was approved by Dr. Laurence Ferrari.    Pt denies chills, fever, fatigue, SOB, CP, diarrhea, emesis or dizziness.  He endorses loss of appetite, LLQ pain, nausea and weakness.  He is NPO per order.  Per pt, last dose of Eliquis 10/23/2022.  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"   Diverticulitis    Gout    High cholesterol    Hypertension    Obesity    Pneumonia 2014 X 1   Prostate cancer ([place])    Type II diabetes mellitus ([place])     Past Surgical History:  Procedure Laterality Date   INCISION AND DRAINAGE PERIRECTAL ABSCESS  1990's   "hemorrhoid"   KNEE ARTHROSCOPY Right ~ 2011   PROSTATE SURGERY  1996   SKIN CANCER EXCISION  ~ 2005 X 2   "left foot"   SUBXYPHOID PERICARDIAL WINDOW N/A 12/21/2018   Procedure: SUBXYPHOID PERICARDIAL WINDOW;  Surgeon:  Ivin Poot, MD;  Location: [facility];  Service: Thoracic;  Laterality: N/A;   TEE WITHOUT CARDIOVERSION N/A 12/21/2018   Procedure: TRANSESOPHAGEAL ECHOCARDIOGRAM (TEE);  Surgeon: Prescott Gum, Collier Salina, MD;  Location[facility] OR;  Service: Thoracic;  Laterality: N/A;    Allergies: Patient has no known allergies.  Medications: Prior to Admission medications   Medication Sig Start Date End Date Taking? Authorizing Provider  allopurinol (ZYLOPRIM) 100 MG tablet Take 100 mg by mouth daily.   Yes [provider]  amLODipine (NORVASC) 5 MG tablet Take 5 mg by mouth daily. 05/20/20  Yes [provider]  atorvastatin (LIPITOR) 20 MG tablet Take 20 mg by mouth daily.   Yes [provider]  clonazePAM (KLONOPIN) 0.5 MG tablet Take 0.5 mg by mouth daily.   Yes [provider]  folic acid (FOLVITE) 1 MG tablet Take 1 tablet (1 mg total) by mouth daily. 12/25/18  Yes Roddenberry, Arlis Porta, PA-C  metoprolol succinate (TOPROL-XL) 25 MG 24 hr tablet TAKE 1 TABLET BY MOUTH EVERY DAY Patient taking differently: Take 25 mg by mouth daily. 05/11/22  Yes Minus Breeding, MD  pantoprazole (PROTONIX) 40 MG tablet Take 40 mg by mouth daily. 09/24/22  Yes [provider]  apixaban (ELIQUIS) 5 MG TABS tablet Take 1 tablet (5 mg total) by mouth 2 (two) times daily. Patient not taking: Reported on 10/26/2022 04/17/22   Minus Breeding, MD     Family History  Problem Relation Age of Onset   CAD Neg  Hx     Social History   Socioeconomic History   Marital status: Married    Spouse name: Not on file   Number of children: Not on file   Years of education: Not on file   Highest education level: Not on file  Occupational History   Occupation: retired  Tobacco Use   Smoking status: Former    Packs/day: 1.50    Years: 32.00    Total pack years: 48.00    Types: Cigarettes    Quit date: 08/20/1987    Years since quitting: 35.2   Smokeless tobacco: Never  Vaping Use   Vaping Use:  Never used  Substance and Sexual Activity   Alcohol use: Yes    Alcohol/week: 21.0 standard drinks of alcohol    Types: 21 Cans of beer per week    Comment: 3 beers a day, no liquor in 5 years   Drug use: No   Sexual activity: Not Currently  Other Topics Concern   Not on file  Social History Narrative   Not on file   Social Determinants of Health   Financial Resource Strain: Not on file  Food Insecurity: No Food Insecurity (10/25/2022)   Hunger Vital Sign    Worried About Running Out of Food in the Last Year: Never true    Ran Out of Food in the Last Year: Never true  Transportation Needs: No Transportation Needs (10/25/2022)   PRAPARE - Hydrologist (Medical): No    Lack of Transportation (Non-Medical): No  Physical Activity: Not on file  Stress: Not on file  Social Connections: Not on file   Review of Systems: A 12 point ROS discussed and pertinent positives are indicated in the HPI above.  All other systems are negative.  Review of Systems  Constitutional:  Positive for appetite change. Negative for chills, fatigue and fever.  Respiratory:  Negative for shortness of breath.   Cardiovascular:  Negative for chest pain and leg swelling.  Gastrointestinal:  Positive for abdominal pain and nausea. Negative for diarrhea and vomiting.  Neurological:  Positive for weakness. Negative for dizziness and headaches.    Vital Signs: BP 133/66 (BP Location: Left Arm)   Pulse 71   Temp 99.6 F (37.6 C) (Oral)   Resp 18   Ht 6' (1.829 m)   Wt 211 lb 3.2 oz (95.8 kg)   SpO2 99%   BMI 28.64 kg/m    Physical Exam Vitals reviewed.  Constitutional:      General: He is not in acute distress.    Appearance: Normal appearance. He is not ill-appearing.  HENT:     Head: Normocephalic and atraumatic.     Mouth/Throat:     Mouth: Mucous membranes are dry.     Pharynx: Oropharynx is clear.  Eyes:     Extraocular Movements: Extraocular movements intact.      Pupils: Pupils are equal, round, and reactive to light.  Cardiovascular:     Rate and Rhythm: Normal rate and regular rhythm.     Pulses: Normal pulses.     Heart sounds: Normal heart sounds. No murmur heard. Pulmonary:     Effort: Pulmonary effort is normal. No respiratory distress.     Breath sounds: Normal breath sounds.  Abdominal:     General: Bowel sounds are normal. There is no distension.     Palpations: Abdomen is soft.     Tenderness: There is abdominal tenderness. There is guarding.  Musculoskeletal:  Right lower leg: No edema.     Left lower leg: No edema.  Skin:    General: Skin is warm and dry.  Neurological:     Mental Status: He is alert and oriented to person, place, and time.  Psychiatric:        Mood and Affect: Mood normal.        Behavior: Behavior normal.        Thought Content: Thought content normal.        Judgment: Judgment normal.     Imaging: CT ABDOMEN PELVIS W CONTRAST  Result Date: 10/25/2022 CLINICAL DATA:  Acute abdominal pain. EXAM: CT ABDOMEN AND PELVIS WITH CONTRAST TECHNIQUE: Multidetector CT imaging of the abdomen and pelvis was performed using the standard protocol following bolus administration of intravenous contrast. RADIATION DOSE REDUCTION: This exam was performed according to the departmental dose-optimization program which includes automated exposure control, adjustment of the mA and/or kV according to patient size and/or use of iterative reconstruction technique. CONTRAST:  12m OMNIPAQUE IOHEXOL 300 MG/ML  SOLN COMPARISON:  CT abdomen pelvis dated 12/20/2018. FINDINGS: Lower chest: No acute abnormality. Hepatobiliary: No focal liver abnormality is seen. No gallstones, gallbladder wall thickening, or biliary dilatation. Pancreas: Unremarkable. No pancreatic ductal dilatation or surrounding inflammatory changes. Spleen: Normal in size without focal abnormality. Adrenals/Urinary Tract: Adrenal glands are unremarkable. Other than a 1.7 cm  cyst in the left kidney, the kidneys are normal, without renal calculi, solid focal lesion, or hydronephrosis. No imaging follow-up is recommended for the left renal cyst. Bladder is unremarkable. Stomach/Bowel: Stomach is within normal limits. There is sigmoid diverticulosis with superimposed bowel wall thickening and inflammatory changes with a contained perforation/abscess in the adjacent mesentery which measures 6.8 x 4.7 x 5.2 cm (series 5, image 69 and series 2, image 73). There is bowel wall thickening and mild associated fat stranding involving the ascending colon (series 2, images 63-71) the appendix appears normal. Evidence of bowel obstruction. Vascular/Lymphatic: Aortic atherosclerosis. An enlarged lymph node in the mesentery is likely reactive (series 5, image 75). Reproductive: The prostate is absent. Other: No abdominal wall hernia or abnormality. No abdominopelvic ascites. Musculoskeletal: Degenerative changes are seen in the spine. IMPRESSION: 1. Acute sigmoid diverticulitis complicated by a contained perforation/abscess in the adjacent mesentery which measures 6.8 x 4.7 x 5.2 cm. 2. Bowel wall thickening and associated inflammatory changes of the ascending colon likely reflect an infectious/inflammatory colitis. Aortic Atherosclerosis (ICD10-I70.0). Electronically Signed   By: TZerita BoersM.D.   On: 10/25/2022 16:45    Labs:  CBC: Recent Labs    10/25/22 1425 10/26/22 0346  WBC 14.0* 15.3*  HGB 12.7* 11.7*  HCT 39.6 36.1*  PLT 246 217    COAGS: No results for input(s): "INR", "APTT" in the last 8760 hours.  BMP: Recent Labs    10/25/22 1425 10/26/22 0346  NA 136 133*  K 4.8 4.1  CL 100 104  CO2 23 22  GLUCOSE 133* 139*  BUN 15 16  CALCIUM 9.0 8.1*  CREATININE 1.30* 1.35*  GFRNONAA 55* 52*    LIVER FUNCTION TESTS: Recent Labs    10/25/22 1425  BILITOT 0.9  AST 16  ALT 9  ALKPHOS 143*  PROT 7.1  ALBUMIN 3.5    TUMOR MARKERS: No results for input(s):  "AFPTM", "CEA", "CA199", "CHROMGRNA" in the last 8760 hours.  Assessment and Plan: 82year old male presents to IR for intra-abdominal drain placement for diverticular abscess.  Pt resting in bed with sister at bedside.  He is A&O, calm and pleasant.  He is in no distress.   WBC 15.3 (14.0) Creatinine 1.35 (1.30) Tmax 99.6, other VSS BCs pending  Risks and benefits discussed with the patient including bleeding, infection, damage to adjacent structures, bowel perforation/fistula connection, and sepsis.  All of the patient's questions were answered, patient is agreeable to proceed. Consent signed and in chart.  Thank you for this interesting consult.  I greatly enjoyed meeting Yeiden Frenkel and look forward to participating in their care.  A copy of this report was sent to the requesting provider on this date.  Electronically Signed: Tyson Alias, NP 10/26/2022, 11:21 AM   I spent a total of 20 minutes in face to face in clinical consultation, greater than 50% of which was counseling/coordinating care for diverticular perforation/abscess.

## 2022-10-27 ENCOUNTER — Inpatient Hospital Stay (HOSPITAL_COMMUNITY): Payer: Medicare HMO

## 2022-10-27 DIAGNOSIS — N179 Acute kidney failure, unspecified: Secondary | ICD-10-CM | POA: Diagnosis not present

## 2022-10-27 DIAGNOSIS — I482 Chronic atrial fibrillation, unspecified: Secondary | ICD-10-CM | POA: Diagnosis not present

## 2022-10-27 DIAGNOSIS — K5720 Diverticulitis of large intestine with perforation and abscess without bleeding: Secondary | ICD-10-CM | POA: Diagnosis not present

## 2022-10-27 DIAGNOSIS — N1831 Chronic kidney disease, stage 3a: Secondary | ICD-10-CM | POA: Diagnosis not present

## 2022-10-27 LAB — CBC
HCT: 35.1 % — ABNORMAL LOW (ref 39.0–52.0)
Hemoglobin: 10.9 g/dL — ABNORMAL LOW (ref 13.0–17.0)
MCH: 27.7 pg (ref 26.0–34.0)
MCHC: 31.1 g/dL (ref 30.0–36.0)
MCV: 89.1 fL (ref 80.0–100.0)
Platelets: 193 10*3/uL (ref 150–400)
RBC: 3.94 MIL/uL — ABNORMAL LOW (ref 4.22–5.81)
RDW: 14.4 % (ref 11.5–15.5)
WBC: 14.9 10*3/uL — ABNORMAL HIGH (ref 4.0–10.5)
nRBC: 0 % (ref 0.0–0.2)

## 2022-10-27 LAB — BASIC METABOLIC PANEL
Anion gap: 15 (ref 5–15)
BUN: 18 mg/dL (ref 8–23)
CO2: 20 mmol/L — ABNORMAL LOW (ref 22–32)
Calcium: 7.4 mg/dL — ABNORMAL LOW (ref 8.9–10.3)
Chloride: 97 mmol/L — ABNORMAL LOW (ref 98–111)
Creatinine, Ser: 1.5 mg/dL — ABNORMAL HIGH (ref 0.61–1.24)
GFR, Estimated: 46 mL/min — ABNORMAL LOW (ref >60)
Glucose, Bld: 127 mg/dL — ABNORMAL HIGH (ref 70–99)
Potassium: 3.6 mmol/L (ref 3.5–5.1)
Sodium: 132 mmol/L — ABNORMAL LOW (ref 135–145)

## 2022-10-27 LAB — MAGNESIUM: Magnesium: 1.4 mg/dL — ABNORMAL LOW (ref 1.7–2.4)

## 2022-10-27 MED ORDER — SODIUM CHLORIDE 0.9% FLUSH
5.0000 mL | Freq: Three times a day (TID) | INTRAVENOUS | Status: DC
  Administered 2022-10-27 – 2022-10-30 (×9): 5 mL

## 2022-10-27 MED ORDER — LIDOCAINE HCL (PF) 1 % IJ SOLN
INTRAMUSCULAR | Status: AC | PRN
  Administered 2022-10-27: 10 mL

## 2022-10-27 MED ORDER — FLUMAZENIL 0.5 MG/5ML IV SOLN
INTRAVENOUS | Status: AC
  Filled 2022-10-27: qty 5

## 2022-10-27 MED ORDER — FENTANYL CITRATE (PF) 100 MCG/2ML IJ SOLN
INTRAMUSCULAR | Status: AC
  Filled 2022-10-27: qty 4

## 2022-10-27 MED ORDER — FENTANYL CITRATE (PF) 100 MCG/2ML IJ SOLN
INTRAMUSCULAR | Status: AC | PRN
  Administered 2022-10-27: 50 ug via INTRAVENOUS

## 2022-10-27 MED ORDER — MAGNESIUM SULFATE 2 GM/50ML IV SOLN
2.0000 g | Freq: Once | INTRAVENOUS | Status: AC
  Administered 2022-10-27: 2 g via INTRAVENOUS
  Filled 2022-10-27: qty 50

## 2022-10-27 MED ORDER — NALOXONE HCL 0.4 MG/ML IJ SOLN
INTRAMUSCULAR | Status: AC
  Filled 2022-10-27: qty 1

## 2022-10-27 MED ORDER — MIDAZOLAM HCL 2 MG/2ML IJ SOLN
INTRAMUSCULAR | Status: AC
  Filled 2022-10-27: qty 4

## 2022-10-27 MED ORDER — MIDAZOLAM HCL 2 MG/2ML IJ SOLN
INTRAMUSCULAR | Status: AC | PRN
  Administered 2022-10-27: 1 mg via INTRAVENOUS

## 2022-10-27 MED ORDER — SODIUM CHLORIDE 0.9 % IV SOLN: INTRAVENOUS | Status: DC

## 2022-10-27 NOTE — Progress Notes (Signed)
  Progress Note   Patient: Ian Estes HAL:[id number] DOB: [birth year] DOA: 10/25/2022     2 DOS: the patient was seen and examined on 10/27/2022   Brief hospital course: 82 year old male PMH including atrial fibrillation on apixaban, diverticulitis, presented with abdominal pain, fluid for acute sigmoid diverticulitis with contained perforation/abscess.  Consultants General surgery IR  Procedures 1/9 Image guided drain placement, LLQ, peri-colonic abscess.    Assessment and Plan: Acute sigmoid diverticulitis with contained perforation/abscess: Possible inflammatory colitis. CT abd/pelvis Acute sigmoid diverticulitis complicated by a contained perforation/abscess. Imaging suggests infectious/inflammatory colitis. WBC stable 14.9 Continue empiric antibiotics.  Pain currently controlled.  Appreciate general surgery.  Status post interventional radiology drain placement 1/9.   GI follow-up as an outpatient.  Hypomagnesemia Mg2+ 1.4, will replete  Normocytic Hgb stable 10.9   Chronic atrial fibrillation: Rate controlled.  Holding Eliquis until follow-up procedures complete.  Resume Toprol-XL, stop IV metoprolol.   AKI superimposed on CKD stage IIIa: Creatinine 1.3 > 1.35 > 1.5, BUN WNL, baseline about 1.2 Etiology unclear. IVF, BMP in AM. If worse tomorrow, stop Zosyn   Essential hypertension: Borderline low. Hold amlodipine   Hyperlipidemia: Can resume statin on discharge   Aortic Atherosclerosis (ICD10-I70.0). Can resume statin on discharge   Gout Allopurinol    Modest hyponatremia.  Monitor.      Subjective:  Feels good No pain now Happy to have liquids  Physical Exam: Vitals:   10/27/22 1415 10/27/22 1420 10/27/22 1425 10/27/22 1430  BP: (!) 82/44 (!) 87/40 (!) 96/47 (!) 95/56  Pulse: 67 68 71 67  Resp: '18 19 12 16  '$ Temp:      TempSrc:      SpO2: 99% 99% 98% 99%  Weight:      Height:       Physical Exam Vitals reviewed.  Constitutional:       General: He is not in acute distress.    Appearance: He is not ill-appearing or toxic-appearing.  Cardiovascular:     Rate and Rhythm: Normal rate and regular rhythm.     Heart sounds: No murmur heard. Pulmonary:     Effort: Pulmonary effort is normal. No respiratory distress.     Breath sounds: No wheezing, rhonchi or rales.  Abdominal:     Palpations: Abdomen is soft.     Tenderness: There is no abdominal tenderness.  Neurological:     Mental Status: He is alert.  Psychiatric:        Mood and Affect: Mood normal.        Behavior: Behavior normal.     Data Reviewed: Creatinine 1.3 > 1.35 > 1.5, BUN WNL K+ WNL Mg2+ 1.4, will replete WBC stable 14.9 Hgb stable 10.9  Family Communication: sister Ian Estes at bedside  Disposition: Status is: Inpatient Remains inpatient appropriate because: perforated diverticulitis  Planned Discharge Destination: Home    Time spent: 20 minutes  Author: Murray Hodgkins, MD 10/27/2022 4:52 PM  For on call review [email or web address].

## 2022-10-27 NOTE — Evaluation (Signed)
Physical Therapy Evaluation Patient Details Name: Ian Estes MRN: [id number] DOB: 05-03-1941 Today's Date: 10/27/2022  History of Present Illness  82 y.o. male  presents to the ED for evaluation of abdominal pain. dx with Acute sigmoid diverticulitis with contained perforation/abscess, General surgery consulted. drain placed per IR on 10/27/22.  PMH: chronic atrial fibrillation on Eliquis, CKD stage IIIa, HTN, HLD, history of diverticulitis w  Clinical Impression  Pt admitted with above diagnosis.  Pt with unsteady gait needing unilateral UE support for balance. Amb~ 100' tolerating distance well, verbalizes he is weaker than his baseline; will follow in acute setting. Should not need f/u PT, may benefit from [facility] depending on progress.  Pt currently with functional limitations due to the deficits listed below (see PT Problem List). Pt will benefit from skilled PT to increase their independence and safety with mobility to allow discharge to the venue listed below.          Recommendations for follow up therapy are one component of a multi-disciplinary discharge planning process, led by the attending physician.  Recommendations may be updated based on patient status, additional functional criteria and insurance authorization.  Follow Up Recommendations No PT follow up      Assistance Recommended at Discharge    Patient can return home with the following  Help with stairs or ramp for entrance;Assist for transportation;Assistance with cooking/housework    Equipment Recommendations Other (comment) (TBD- SPC vs no device)  Recommendations for Other Services       Functional Status Assessment Patient has had a recent decline in their functional status and demonstrates the ability to make significant improvements in function in a reasonable and predictable amount of time.     Precautions / Restrictions Precautions Precautions: Fall Restrictions Weight Bearing Restrictions: No Other  Position/Activity Restrictions: drain LLQ      Mobility  Bed Mobility Overal bed mobility: Needs Assistance Bed Mobility: Supine to Sit, Sit to Supine     Supine to sit: Supervision Sit to supine: Supervision   General bed mobility comments: for safety and line management    Transfers Overall transfer level: Needs assistance Equipment used: None Transfers: Sit to/from Stand Sit to Stand: Min guard           General transfer comment: wide BOS, unsteady on initial stand, min/guard for safety    Ambulation/Gait Ambulation/Gait assistance: Min guard Gait Distance (Feet): 100 Feet Assistive device: None, IV Pole Gait Pattern/deviations: Step-through pattern, Decreased stride length, Wide base of support       General Gait Details: unsteady however without overt LOB; min guard for safety and IV pole position; pt tending to furniture walk without use of IV pole  Stairs            Wheelchair Mobility    Modified Rankin (Stroke Patients Only)       Balance Overall balance assessment: Mild deficits observed, not formally tested                                           Pertinent Vitals/Pain Pain Assessment Pain Assessment: No/denies pain    Home Living Family/patient expects to be discharged to:: Private residence Living Arrangements: Children Available Help at Discharge: Family;Available PRN/intermittently Type of Home: House Home Access: Stairs to enter Entrance Stairs-Rails: None Entrance Stairs-Number of Steps: 1-2   Home Layout: One level;Laundry or work area in [employer]  Equipment: None Additional Comments: lives with sone currently while building of his house being completed; denies falls    Prior Function Prior Level of Function : Independent/Modified Independent;Driving                     Hand Dominance        Extremity/Trunk Assessment   Upper Extremity Assessment Upper Extremity Assessment: Overall  WFL for tasks assessed    Lower Extremity Assessment Lower Extremity Assessment: Overall WFL for tasks assessed (pt reports "bad knees, need surgery")       Communication      Cognition Arousal/Alertness: Awake/alert Behavior During Therapy: WFL for tasks assessed/performed Overall Cognitive Status: Within Functional Limits for tasks assessed                                          General Comments      Exercises     Assessment/Plan    PT Assessment Patient needs continued PT services  PT Problem List Decreased activity tolerance;Decreased mobility       PT Treatment Interventions DME instruction;Therapeutic exercise;Gait training;Functional mobility training;Therapeutic activities;Patient/family education    PT Goals (Current goals can be found in the Care Plan section)  Acute Rehab PT Goals Patient Stated Goal: "to go home but not until dr is ready " PT Goal Formulation: With patient Time For Goal Achievement: 11/10/22 Potential to Achieve Goals: Good    Frequency Min 3X/week     Co-evaluation               AM-PAC PT "6 Clicks" Mobility  Outcome Measure Help needed turning from your back to your side while in a flat bed without using bedrails?: None Help needed moving from lying on your back to sitting on the side of a flat bed without using bedrails?: None Help needed moving to and from a bed to a chair (including a wheelchair)?: A Little Help needed standing up from a chair using your arms (e.g., wheelchair or bedside chair)?: A Little Help needed to walk in hospital room?: A Little Help needed climbing 3-5 steps with a railing? : A Little 6 Click Score: 20    End of Session   Activity Tolerance: Patient tolerated treatment well Patient left: in bed;with call bell/phone within reach;with bed alarm set Nurse Communication: Mobility status;Other (comment) (IV site bleeding) PT Visit Diagnosis: Other abnormalities of gait and  mobility (R26.89);Difficulty in walking, not elsewhere classified (R26.2)    Time: 5465-6812 PT Time Calculation (min) (ACUTE ONLY): 21 min   Charges:   PT Evaluation $PT Eval Low Complexity: [place], PT  Acute Rehab Dept [facility]) [phone]  [facility] Weekend Pager [facility] only)  [phone]  10/27/2022   [facility] 10/27/2022, 5:15 PM

## 2022-10-27 NOTE — Procedures (Signed)
Interventional Radiology Procedure Note  Procedure: Image guided drain placement, LLQ, peri-colonic abscess.  38F pigtail drain.  Findings: DDX: giant super-infected diverticulum, wall abscess, peri-colonic abscess from ruptured diverticulum ~30cc of dark brown fluid with debris.   Complications: None  EBL: None Sample: Culture sent  Recommendations: - Routine drain care, with sterile flushes, record output - follow up Cx - routine wound care  Signed,  Dulcy Fanny. Earleen Newport, DO

## 2022-10-27 NOTE — Progress Notes (Signed)
Mobility Specialist - Progress Note   10/27/22 1109  Mobility  Activity Ambulated with assistance in hallway  Level of Assistance Standby assist, set-up cues, supervision of patient - no hands on  Assistive Device Other (Comment) (IV Pole)  Distance Ambulated (ft) 120 ft  Activity Response Tolerated well  Mobility Referral Yes  $Mobility charge 1 Mobility   Pt received in bed and agreeable to mobility. Pt had some LOB once standing, but was able to self correct. No complaints during mobility. Pt to bed after session with all needs met.    [facility]

## 2022-10-27 NOTE — Progress Notes (Signed)
[facility] Progress Note     Subjective: CC-  Feels about the same as yesterday. Continues to have some lower abdominal pain, but it is much improved since admission. Denies n/v. Tolerated liquids last night. Passing flatus, no BM. WBC slightly down 14.9, afebrile.  Objective: Vital signs in last 24 hours: Temp:  [98.1 F (36.7 C)-98.8 F (37.1 C)] 98.6 F (37 C) (01/09 0549) Pulse Rate:  [64-75] 67 (01/09 0549) Resp:  [18] 18 (01/09 0549) BP: (93-135)/(56-73) 93/60 (01/09 0549) SpO2:  [98 %-100 %] 98 % (01/09 0549) Last BM Date : 10/25/22  Intake/Output from previous day: 01/08 0701 - 01/09 0700 In: 227.9 [IV Piggyback:227.9] Out: -  Intake/Output this shift: No intake/output data recorded.  PE: Gen:  Alert, NAD, pleasant Pulm: rate and effort normal on room air Abd: well healed lower midline incision, soft, ND, +BS, no masses, hernias, or organomegaly. TTP across the lower abdomen LLQ>RLQ without rebound or guarding, no peritonitis   Lab Results:  Recent Labs    10/26/22 0346 10/27/22 0809  WBC 15.3* 14.9*  HGB 11.7* 10.9*  HCT 36.1* 35.1*  PLT 217 193   BMET Recent Labs    10/26/22 0346 10/27/22 0809  NA 133* 132*  K 4.1 3.6  CL 104 97*  CO2 22 20*  GLUCOSE 139* 127*  BUN 16 18  CREATININE 1.35* 1.50*  CALCIUM 8.1* 7.4*   PT/INR No results for input(s): "LABPROT", "INR" in the last 72 hours. CMP     Component Value Date/Time   NA 132 (L) 10/27/2022 0809   NA 138 01/12/2019 0852   K 3.6 10/27/2022 0809   CL 97 (L) 10/27/2022 0809   CO2 20 (L) 10/27/2022 0809   GLUCOSE 127 (H) 10/27/2022 0809   BUN 18 10/27/2022 0809   BUN 20 01/12/2019 0852   CREATININE 1.50 (H) 10/27/2022 0809   CALCIUM 7.4 (L) 10/27/2022 0809   PROT 7.1 10/25/2022 1425   ALBUMIN 3.5 10/25/2022 1425   AST 16 10/25/2022 1425   ALT 9 10/25/2022 1425   ALKPHOS 143 (H) 10/25/2022 1425   BILITOT 0.9 10/25/2022 1425   GFRNONAA 46 (L) 10/27/2022 0809   GFRAA 63  01/12/2019 0852   Lipase     Component Value Date/Time   LIPASE <10 (L) 10/25/2022 1425       Studies/Results: CT ABDOMEN PELVIS W CONTRAST  Result Date: 10/25/2022 CLINICAL DATA:  Acute abdominal pain. EXAM: CT ABDOMEN AND PELVIS WITH CONTRAST TECHNIQUE: Multidetector CT imaging of the abdomen and pelvis was performed using the standard protocol following bolus administration of intravenous contrast. RADIATION DOSE REDUCTION: This exam was performed according to the departmental dose-optimization program which includes automated exposure control, adjustment of the mA and/or kV according to patient size and/or use of iterative reconstruction technique. CONTRAST:  173m OMNIPAQUE IOHEXOL 300 MG/ML  SOLN COMPARISON:  CT abdomen pelvis dated 12/20/2018. FINDINGS: Lower chest: No acute abnormality. Hepatobiliary: No focal liver abnormality is seen. No gallstones, gallbladder wall thickening, or biliary dilatation. Pancreas: Unremarkable. No pancreatic ductal dilatation or surrounding inflammatory changes. Spleen: Normal in size without focal abnormality. Adrenals/Urinary Tract: Adrenal glands are unremarkable. Other than a 1.7 cm cyst in the left kidney, the kidneys are normal, without renal calculi, solid focal lesion, or hydronephrosis. No imaging follow-up is recommended for the left renal cyst. Bladder is unremarkable. Stomach/Bowel: Stomach is within normal limits. 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 There is bowel wall thickening and mild associated fat stranding involving the ascending colon (series 2, images 63-71) the appendix appears normal. Evidence of bowel obstruction. Vascular/Lymphatic: Aortic atherosclerosis. An enlarged lymph node in the mesentery is likely reactive (series 5, image 75).  Reproductive: The prostate is absent. Other: No abdominal wall hernia or abnormality. No abdominopelvic ascites. Musculoskeletal: Degenerative changes are seen in the spine. IMPRESSION: 1. Acute sigmoid diverticulitis complicated by a contained perforation/abscess in the adjacent mesentery which measures 6.8 x 4.7 x 5.2 cm. 2. Bowel wall thickening and associated inflammatory changes of the ascending colon likely reflect an infectious/inflammatory colitis. Aortic Atherosclerosis (ICD10-I70.0). Electronically Signed   By: Zerita Boers M.D.   On: 10/25/2022 16:45    Anti-infectives: Anti-infectives (From admission, onward)    Start     Dose/Rate Route Frequency Ordered Stop   10/26/22 1400  piperacillin-tazobactam (ZOSYN) IVPB 3.375 g  Status:  Discontinued        3.375 g 100 mL/hr over 30 Minutes Intravenous Every 8 hours 10/26/22 0935 10/26/22 0940   10/26/22 1200  piperacillin-tazobactam (ZOSYN) IVPB 3.375 g        3.375 g 12.5 mL/hr over 240 Minutes Intravenous Every 8 hours 10/26/22 0940     10/25/22 2200  cefTRIAXone (ROCEPHIN) 2 g in sodium chloride 0.9 % 100 mL IVPB  Status:  Discontinued        2 g 200 mL/hr over 30 Minutes Intravenous Every 24 hours 10/25/22 2059 10/26/22 0935   10/25/22 2200  metroNIDAZOLE (FLAGYL) IVPB 500 mg  Status:  Discontinued        500 mg 100 mL/hr over 60 Minutes Intravenous Every 12 hours 10/25/22 2059 10/26/22 0935   10/25/22 1700  Ampicillin-Sulbactam (UNASYN) 3 g in sodium chloride 0.9 % 100 mL IVPB        3 g 200 mL/hr over 30 Minutes Intravenous  Once 10/25/22 1651 10/25/22 1806        Assessment/Plan Sigmoid diverticulitis with contained perforation/ abscess - 2nd bout requiring admission, first bout was 5 years ago and resolved with antibiotics. Last colonoscopy was about 7 years ago with Dr. Collene Mares - CT abdomen/pelvis 1/7 with acute sigmoid diverticulitis complicated by a contained perforation/ abscess in the adjacent mesentery which measures  6.8 x 4.7 x 5.2 cm; bowel wall thickening and associated inflammatory changes of the ascending colon likely reflect an infectious/inflammatory colitis - IR plans to attempt drain placement today. Continue IV zosyn. He is NPO for procedure, ok for clear liquids after procedure from our standpoint. I will start him on IVF at 75cc/hr while he is NPO. Replace Mag.   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

## 2022-10-27 NOTE — Progress Notes (Signed)
  Transition of Care (TOC) Screening Note   Patient Details  Name: Ian Estes Date of Birth: 1941/05/06   Transition of Care [facility] CM/SW Contact:    Vassie Moselle, LCSW Phone Number: 10/27/2022, 9:24 AM    Transition of Care Department [facility]) has reviewed patient and no TOC needs have been identified at this time. We will continue to monitor patient advancement through interdisciplinary progression rounds. If new patient transition needs arise, please place a TOC consult.

## 2022-10-28 DIAGNOSIS — K5720 Diverticulitis of large intestine with perforation and abscess without bleeding: Secondary | ICD-10-CM | POA: Diagnosis not present

## 2022-10-28 LAB — CBC
HCT: 32.6 % — ABNORMAL LOW (ref 39.0–52.0)
Hemoglobin: 10.3 g/dL — ABNORMAL LOW (ref 13.0–17.0)
MCH: 27.7 pg (ref 26.0–34.0)
MCHC: 31.6 g/dL (ref 30.0–36.0)
MCV: 87.6 fL (ref 80.0–100.0)
Platelets: 214 10*3/uL (ref 150–400)
RBC: 3.72 MIL/uL — ABNORMAL LOW (ref 4.22–5.81)
RDW: 14.3 % (ref 11.5–15.5)
WBC: 9.1 10*3/uL (ref 4.0–10.5)
nRBC: 0 % (ref 0.0–0.2)

## 2022-10-28 LAB — MAGNESIUM: Magnesium: 2.1 mg/dL (ref 1.7–2.4)

## 2022-10-28 LAB — BASIC METABOLIC PANEL
Anion gap: 6 (ref 5–15)
BUN: 19 mg/dL (ref 8–23)
CO2: 21 mmol/L — ABNORMAL LOW (ref 22–32)
Calcium: 7.7 mg/dL — ABNORMAL LOW (ref 8.9–10.3)
Chloride: 107 mmol/L (ref 98–111)
Creatinine, Ser: 1.35 mg/dL — ABNORMAL HIGH (ref 0.61–1.24)
GFR, Estimated: 52 mL/min — ABNORMAL LOW (ref >60)
Glucose, Bld: 101 mg/dL — ABNORMAL HIGH (ref 70–99)
Potassium: 3.8 mmol/L (ref 3.5–5.1)
Sodium: 134 mmol/L — ABNORMAL LOW (ref 135–145)

## 2022-10-28 MED ORDER — FLUCONAZOLE IN SODIUM CHLORIDE 200-0.9 MG/100ML-% IV SOLN
200.0000 mg | INTRAVENOUS | Status: DC
  Administered 2022-10-28 – 2022-10-29 (×2): 200 mg via INTRAVENOUS
  Filled 2022-10-28 (×3): qty 100

## 2022-10-28 MED ORDER — BOOST / RESOURCE BREEZE PO LIQD CUSTOM
1.0000 | Freq: Three times a day (TID) | ORAL | Status: DC
  Administered 2022-10-28 – 2022-10-30 (×7): 1 via ORAL

## 2022-10-28 NOTE — Progress Notes (Signed)
[facility] Progress Note Patient: Ian Estes MCN:[id number] DOB: 23-Jan-1941 DOA: 10/25/2022  DOS: the patient was seen and examined on 10/28/2022  Brief hospital course: 82 year old male PMH including atrial fibrillation on apixaban, diverticulitis, presented with abdominal pain, fluid for acute sigmoid diverticulitis with contained perforation/abscess.  Consultants General surgery IR  Procedures 1/9 Image guided drain placement, LLQ, peri-colonic abscess.    Assessment and Plan: Acute sigmoid diverticulitis with abscess. SP drain placement. General surgery following. Continue IV antibiotic. Monitor. Found to have yeast and currently also now on IV Diflucan.  Chronic A-fib. Eliquis on hold for now.  Resume once cleared by surgery.  AKI on CKD 3A. Renal function stable. Monitor   HTN. Blood pressure stable. Holding off oral regimens.   Subjective: Improving abdominal pain.  No nausea no vomiting.  Physical Exam: General: in Mild distress, No Rash Cardiovascular: S1 and S2 Present, No Murmur Respiratory: Good respiratory effort, Bilateral Air entry present. No Crackles, No wheezes Abdomen: Bowel Sound present, mild diffuse tenderness Extremities: No edema Neuro: Alert and oriented x3, no new focal deficit  Data Reviewed: I have Reviewed nursing notes, Vitals, and Lab results. Since last encounter, pertinent lab results CBC and BMP   . I have ordered test including CBC and BMP  .   Disposition: Status is: Inpatient Remains inpatient appropriate because: Need for IV antibiotics and further monitoring of drain output  SCDs Start: 10/25/22 2056   Family Communication: No one at bedside Level of care: Med-Surg Continue MedSurg Vitals:   10/27/22 1918 10/28/22 0509 10/28/22 1330 10/28/22 1945  BP: 118/61 106/61 123/73 130/64  Pulse: (!) 57 61 (!) 57 (!) 59  Resp: '20 18 17 16  '$ Temp: 98.3 F (36.8 C) 97.6 F (36.4 C) (!) 97.5 F (36.4 C) (!) 97.5 F (36.4  C)  TempSrc: Oral  Axillary Oral  SpO2: 100% 98% (!) 79% 100%  Weight:      Height:         Author: Berle Mull, MD 10/28/2022 7:46 PM  Please look on [email or web address] to find out who is on call.

## 2022-10-28 NOTE — Progress Notes (Signed)
Referring Physician(s): Ok Anis  Supervising Physician: Michaelle Birks  Patient Status:  [facility] - In-pt  Chief Complaint: Abdominal pain/abscess   Subjective: Pt states that he feels better since abd drain placed yesterday; denies fever,N/V   Allergies: Patient has no known allergies.  Medications: Prior to Admission medications   Medication Sig Start Date End Date Taking? Authorizing Provider  allopurinol (ZYLOPRIM) 100 MG tablet Take 100 mg by mouth daily.   Yes [provider]  amLODipine (NORVASC) 5 MG tablet Take 5 mg by mouth daily. 05/20/20  Yes [provider]  atorvastatin (LIPITOR) 20 MG tablet Take 20 mg by mouth daily.   Yes [provider]  clonazePAM (KLONOPIN) 0.5 MG tablet Take 0.5 mg by mouth daily.   Yes [provider]  folic acid (FOLVITE) 1 MG tablet Take 1 tablet (1 mg total) by mouth daily. 12/25/18  Yes Roddenberry, Arlis Porta, PA-C  metoprolol succinate (TOPROL-XL) 25 MG 24 hr tablet TAKE 1 TABLET BY MOUTH EVERY DAY Patient taking differently: Take 25 mg by mouth daily. 05/11/22  Yes Minus Breeding, MD  pantoprazole (PROTONIX) 40 MG tablet Take 40 mg by mouth daily. 09/24/22  Yes [provider]  apixaban (ELIQUIS) 5 MG TABS tablet Take 1 tablet (5 mg total) by mouth 2 (two) times daily. Patient not taking: Reported on 10/26/2022 04/17/22   Minus Breeding, MD     Vital Signs: BP 123/73 (BP Location: Left Arm)   Pulse (!) 57   Temp (!) 97.5 F (36.4 C) (Oral)   Resp 17   Ht 6' (1.829 m)   Wt 211 lb 3.2 oz (95.8 kg)   SpO2 (!) 79%   BMI 28.64 kg/m   Physical Exam awake/answers questions ok; LLQ drain intact, insertion site ok, output 380 cc yesterday, 125 cc today yellow fluid (somewhat ascitic looking); drain flushed without difficulty  Imaging: CT IMAGE GUIDE DRAIN TRANSVAG TRANSRECT PERITONEAL RETROPER  Result Date: 10/27/2022 INDICATION: 82 year old male referred for drainage diverticular abscess EXAM:  CT GUIDED DRAINAGE OF  ABSCESS MEDICATIONS: The patient is currently admitted to the hospital and receiving intravenous antibiotics. The antibiotics were administered within an appropriate time frame prior to the initiation of the procedure. ANESTHESIA/SEDATION: 1.0 mg IV Versed 50 mcg IV Fentanyl Moderate Sedation Time:  18 minutes The patient was continuously monitored during the procedure by the interventional radiology nurse under my direct supervision. COMPLICATIONS: None TECHNIQUE: Informed written consent was obtained from the patient after a thorough discussion of the procedural risks, benefits and alternatives. All questions were addressed. Maximal Sterile Barrier Technique was utilized including caps, mask, sterile gowns, sterile gloves, sterile drape, hand hygiene and skin antiseptic. A timeout was performed prior to the initiation of the procedure. PROCEDURE: Patient was position left anterior oblique on the CT gantry table. Scout CT acquired for planning purposes. The left lower abdominal wall was prepped with chlorhexidine in a sterile fashion, and a sterile drape was applied covering the operative field. A sterile gown and sterile gloves were used for the procedure. Local anesthesia was provided with 1% Lidocaine. Using CT guidance, trocar needle was advanced to the abscess within the central abdomen. Modified Seldinger technique was used to place a 10 [place] drain. Proximally 30 cc of dark thin fluid with internal complexity aspirated. Drain was sutured in position and attached to gravity drainage. Final CT was acquired. Patient tolerated the procedure well and remained hemodynamically stable throughout. No complications were encountered and no significant blood loss. FINDINGS: Abscess  within the central low abdomen. Ten French drain within the central abscess. Sample sent for culture. IMPRESSION: Status post CT-guided drainage of abdominal abscess. Signed, Dulcy Fanny. Nadene Rubins, RPVI [facility] Radiology Electronically Signed   By: Corrie Mckusick D.O.   On: 10/27/2022 16:20   CT ABDOMEN PELVIS W CONTRAST  Result Date: 10/25/2022 CLINICAL DATA:  Acute abdominal pain. EXAM: CT ABDOMEN AND PELVIS WITH CONTRAST TECHNIQUE: Multidetector CT imaging of the abdomen and pelvis was performed using the standard protocol following bolus administration of intravenous contrast. RADIATION DOSE REDUCTION: This exam was performed according to the departmental dose-optimization program which includes automated exposure control, adjustment of the mA and/or kV according to patient size and/or use of iterative reconstruction technique. CONTRAST:  116m OMNIPAQUE IOHEXOL 300 MG/ML  SOLN COMPARISON:  CT abdomen pelvis dated 12/20/2018. FINDINGS: Lower chest: No acute abnormality. Hepatobiliary: No focal liver abnormality is seen. No gallstones, gallbladder wall thickening, or biliary dilatation. Pancreas: Unremarkable. No pancreatic ductal dilatation or surrounding inflammatory changes. Spleen: Normal in size without focal abnormality. Adrenals/Urinary Tract: Adrenal glands are unremarkable. Other than a 1.7 cm cyst in the left kidney, the kidneys are normal, without renal calculi, solid focal lesion, or hydronephrosis. No imaging follow-up is recommended for the left renal cyst. Bladder is unremarkable. Stomach/Bowel: Stomach is within normal limits. There is sigmoid diverticulosis with superimposed bowel wall thickening and inflammatory changes with a contained perforation/abscess in the adjacent mesentery which measures 6.8 x 4.7 x 5.2 cm (series 5, image 69 and series 2, image 73). There is bowel wall thickening and mild associated fat stranding involving the ascending colon (series 2, images 63-71) the appendix appears normal. Evidence of bowel obstruction. Vascular/Lymphatic: Aortic atherosclerosis. An enlarged lymph node in the mesentery is likely reactive (series 5,  image 75). Reproductive: The prostate is absent. Other: No abdominal wall hernia or abnormality. No abdominopelvic ascites. Musculoskeletal: Degenerative changes are seen in the spine. IMPRESSION: 1. Acute sigmoid diverticulitis complicated by a contained perforation/abscess in the adjacent mesentery which measures 6.8 x 4.7 x 5.2 cm. 2. Bowel wall thickening and associated inflammatory changes of the ascending colon likely reflect an infectious/inflammatory colitis. Aortic Atherosclerosis (ICD10-I70.0). Electronically Signed   By: TZerita BoersM.D.   On: 10/25/2022 16:45    Labs:  CBC: Recent Labs    10/25/22 1425 10/26/22 0346 10/27/22 0809 10/28/22 0353  WBC 14.0* 15.3* 14.9* 9.1  HGB 12.7* 11.7* 10.9* 10.3*  HCT 39.6 36.1* 35.1* 32.6*  PLT 246 217 193 214    COAGS: No results for input(s): "INR", "APTT" in the last 8760 hours.  BMP: Recent Labs    10/25/22 1425 10/26/22 0346 10/27/22 0809 10/28/22 0353  NA 136 133* 132* 134*  K 4.8 4.1 3.6 3.8  CL 100 104 97* 107  CO2 23 22 20* 21*  GLUCOSE 133* 139* 127* 101*  BUN '15 16 18 19  '$ CALCIUM 9.0 8.1* 7.4* 7.7*  CREATININE 1.30* 1.35* 1.50* 1.35*  GFRNONAA 55* 52* 46* 52*    LIVER FUNCTION TESTS: Recent Labs    10/25/22 1425  BILITOT 0.9  AST 16  ALT 9  ALKPHOS 143*  PROT 7.1  ALBUMIN 3.5    Assessment and Plan: Pt with hx sigmoid diverticulitis with contained perf/abscess; s/p LLQ drain placement 10/27/22 (10 fr to bag); afebrile; WBC nl, hgb stable, creat 1.35, drain fl cx pend; cont with current tx, monitor output closely; obtain f/u CT once output minimal or if  clinical status worsens/WBC increases   Electronically Signed: D. Rowe Robert, PA-C 10/28/2022, 2:11 PM   I spent a total of 15 Minutes at the the patient's bedside AND on the patient's hospital floor or unit, greater than 50% of which was counseling/coordinating care for abdominal abscess drain    Patient ID: Ian Estes, male   DOB:  Sep 05, 1941, 82 y.o.   MRN: [id number]

## 2022-10-28 NOTE — Progress Notes (Signed)
[facility] Progress Note     Subjective: CC-  Sore from drain placement but overall feeling better. Abdominal pain is LLQ and less. Denies n/v. Tolerating clear liquids. He has had 1 BM since admission. WBC normalized today 9.1, afebrile.  Objective: Vital signs in last 24 hours: Temp:  [97.6 F (36.4 C)-98.3 F (36.8 C)] 97.6 F (36.4 C) (01/10 0509) Pulse Rate:  [57-71] 61 (01/10 0509) Resp:  [12-20] 18 (01/10 0509) BP: (82-118)/(40-61) 106/61 (01/10 0509) SpO2:  [95 %-100 %] 98 % (01/10 0509) Last BM Date : 10/27/22  Intake/Output from previous day: 01/09 0701 - 01/10 0700 In: 586.4 [I.V.:421.2; IV Piggyback:160.2] Out: 380 [Drains:380] Intake/Output this shift: Total I/O In: 120 [P.O.:120] Out: -   PE: Gen:  Alert, NAD, pleasant Pulm: rate and effort normal on room air Abd: soft, ND, mild LLQ TTP without rebound or guarding, no peritonitis, drain with thin brown fluid in bag  Lab Results:  Recent Labs    10/27/22 0809 10/28/22 0353  WBC 14.9* 9.1  HGB 10.9* 10.3*  HCT 35.1* 32.6*  PLT 193 214   BMET Recent Labs    10/27/22 0809 10/28/22 0353  NA 132* 134*  K 3.6 3.8  CL 97* 107  CO2 20* 21*  GLUCOSE 127* 101*  BUN 18 19  CREATININE 1.50* 1.35*  CALCIUM 7.4* 7.7*   PT/INR No results for input(s): "LABPROT", "INR" in the last 72 hours. CMP     Component Value Date/Time   NA 134 (L) 10/28/2022 0353   NA 138 01/12/2019 0852   K 3.8 10/28/2022 0353   CL 107 10/28/2022 0353   CO2 21 (L) 10/28/2022 0353   GLUCOSE 101 (H) 10/28/2022 0353   BUN 19 10/28/2022 0353   BUN 20 01/12/2019 0852   CREATININE 1.35 (H) 10/28/2022 0353   CALCIUM 7.7 (L) 10/28/2022 0353   PROT 7.1 10/25/2022 1425   ALBUMIN 3.5 10/25/2022 1425   AST 16 10/25/2022 1425   ALT 9 10/25/2022 1425   ALKPHOS 143 (H) 10/25/2022 1425   BILITOT 0.9 10/25/2022 1425   GFRNONAA 52 (L) 10/28/2022 0353   GFRAA 63 01/12/2019 0852   Lipase     Component Value Date/Time    LIPASE <10 (L) 10/25/2022 1425       Studies/Results: CT IMAGE GUIDE DRAIN TRANSVAG TRANSRECT PERITONEAL RETROPER  Result Date: 10/27/2022 INDICATION: 81 year old male referred for drainage diverticular abscess EXAM: CT GUIDED DRAINAGE OF  ABSCESS MEDICATIONS: The patient is currently admitted to the hospital and receiving intravenous antibiotics. The antibiotics were administered within an appropriate time frame prior to the initiation of the procedure. ANESTHESIA/SEDATION: 1.0 mg IV Versed 50 mcg IV Fentanyl Moderate Sedation Time:  18 minutes The patient was continuously monitored during the procedure by the interventional radiology nurse under my direct supervision. COMPLICATIONS: None TECHNIQUE: Informed written consent was obtained from the patient after a thorough discussion of the procedural risks, benefits and alternatives. All questions were addressed. Maximal Sterile Barrier Technique was utilized including caps, mask, sterile gowns, sterile gloves, sterile drape, hand hygiene and skin antiseptic. A timeout was performed prior to the initiation of the procedure. PROCEDURE: Patient was position left anterior oblique on the CT gantry table. Scout CT acquired for planning purposes. The left lower abdominal wall was prepped with chlorhexidine in a sterile fashion, and a sterile drape was applied covering the operative field. A sterile gown and sterile gloves were used for the procedure. Local anesthesia was provided with 1% Lidocaine.  Using CT guidance, trocar needle was advanced to the abscess within the central abdomen. Modified Seldinger technique was used to place a 10 [place] drain. Proximally 30 cc of dark thin fluid with internal complexity aspirated. Drain was sutured in position and attached to gravity drainage. Final CT was acquired. Patient tolerated the procedure well and remained hemodynamically stable throughout. No complications were encountered and no significant blood loss.  FINDINGS: Abscess within the central low abdomen. Ten French drain within the central abscess. Sample sent for culture. IMPRESSION: Status post CT-guided drainage of abdominal abscess. Signed, Dulcy Fanny. Nadene Rubins, RPVI [facility] Inter[facility] Radiology Special[facility] Radiology Electronically Signed   By: Corrie Mckusick D.O.   On: 10/27/2022 16:20    Anti-infectives: Anti-infectives (From admission, onward)    Start     Dose/Rate Route Frequency Ordered Stop   10/28/22 0800  fluconazole (DIFLUCAN) IVPB 200 mg        200 mg 100 mL/hr over 60 Minutes Intravenous Every 24 hours 10/28/22 0729     10/26/22 1400  piperacillin-tazobactam (ZOSYN) IVPB 3.375 g  Status:  Discontinued        3.375 g 100 mL/hr over 30 Minutes Intravenous Every 8 hours 10/26/22 0935 10/26/22 0940   10/26/22 1200  piperacillin-tazobactam (ZOSYN) IVPB 3.375 g        3.375 g 12.5 mL/hr over 240 Minutes Intravenous Every 8 hours 10/26/22 0940     10/25/22 2200  cefTRIAXone (ROCEPHIN) 2 g in sodium chloride 0.9 % 100 mL IVPB  Status:  Discontinued        2 g 200 mL/hr over 30 Minutes Intravenous Every 24 hours 10/25/22 2059 10/26/22 0935   10/25/22 2200  metroNIDAZOLE (FLAGYL) IVPB 500 mg  Status:  Discontinued        500 mg 100 mL/hr over 60 Minutes Intravenous Every 12 hours 10/25/22 2059 10/26/22 0935   10/25/22 1700  Ampicillin-Sulbactam (UNASYN) 3 g in sodium chloride 0.9 % 100 mL IVPB        3 g 200 mL/hr over 30 Minutes Intravenous  Once 10/25/22 1651 10/25/22 1806        Assessment/Plan Sigmoid diverticulitis with contained perforation/ abscess - 2nd bout requiring admission, first bout was 5 years ago and resolved with antibiotics. Last colonoscopy was about 7 years ago with Dr. Collene Mares - CT abdomen/pelvis 1/7 with acute sigmoid diverticulitis complicated by a contained perforation/ abscess in the adjacent mesentery which measures 6.8 x 4.7 x 5.2 cm; bowel wall thickening and associated  inflammatory changes of the ascending colon likely reflect an infectious/inflammatory colitis - s/p IR drain 1/9. Gram stain with GPR, GPC, and few yeast, follow culture. Monitor drain output  - Ok for full liquids. Continue IV zosyn and add diflucan given yeast on gram stain. Mobilize.    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

## 2022-10-29 DIAGNOSIS — K5720 Diverticulitis of large intestine with perforation and abscess without bleeding: Secondary | ICD-10-CM | POA: Diagnosis not present

## 2022-10-29 LAB — CBC
HCT: 34 % — ABNORMAL LOW (ref 39.0–52.0)
Hemoglobin: 10.4 g/dL — ABNORMAL LOW (ref 13.0–17.0)
MCH: 27.7 pg (ref 26.0–34.0)
MCHC: 30.6 g/dL (ref 30.0–36.0)
MCV: 90.7 fL (ref 80.0–100.0)
Platelets: 243 10*3/uL (ref 150–400)
RBC: 3.75 MIL/uL — ABNORMAL LOW (ref 4.22–5.81)
RDW: 14.5 % (ref 11.5–15.5)
WBC: 7.8 10*3/uL (ref 4.0–10.5)
nRBC: 0 % (ref 0.0–0.2)

## 2022-10-29 LAB — RENAL FUNCTION PANEL
Albumin: 2.2 g/dL — ABNORMAL LOW (ref 3.5–5.0)
Anion gap: 5 (ref 5–15)
BUN: 14 mg/dL (ref 8–23)
CO2: 21 mmol/L — ABNORMAL LOW (ref 22–32)
Calcium: 7.8 mg/dL — ABNORMAL LOW (ref 8.9–10.3)
Chloride: 109 mmol/L (ref 98–111)
Creatinine, Ser: 1.19 mg/dL (ref 0.61–1.24)
GFR, Estimated: >60 mL/min (ref >60)
Glucose, Bld: 105 mg/dL — ABNORMAL HIGH (ref 70–99)
Phosphorus: 2.5 mg/dL (ref 2.5–4.6)
Potassium: 4.7 mmol/L (ref 3.5–5.1)
Sodium: 135 mmol/L (ref 135–145)

## 2022-10-29 LAB — AEROBIC/ANAEROBIC CULTURE W GRAM STAIN (SURGICAL/DEEP WOUND): Gram Stain: NONE SEEN

## 2022-10-29 LAB — MAGNESIUM: Magnesium: 1.7 mg/dL (ref 1.7–2.4)

## 2022-10-29 MED ORDER — SACCHAROMYCES BOULARDII 250 MG PO CAPS: 250.0000 mg | ORAL_CAPSULE | Freq: Two times a day (BID) | ORAL | 0 refills | Status: DC

## 2022-10-29 MED ORDER — HYDROMORPHONE HCL 1 MG/ML IJ SOLN: 0.5000 mg | INTRAMUSCULAR | Status: DC | PRN

## 2022-10-29 MED ORDER — SACCHAROMYCES BOULARDII 250 MG PO CAPS
250.0000 mg | ORAL_CAPSULE | Freq: Two times a day (BID) | ORAL | Status: DC
  Administered 2022-10-29 – 2022-10-30 (×3): 250 mg via ORAL
  Filled 2022-10-29 (×3): qty 1

## 2022-10-29 MED ORDER — OXYCODONE HCL 5 MG PO TABS: 5.0000 mg | ORAL_TABLET | ORAL | Status: DC | PRN

## 2022-10-29 NOTE — Progress Notes (Signed)
[facility] Progress Note Patient: Ian Estes JJH:[id number] DOB: 1941/03/24 DOA: 10/25/2022  DOS: the patient was seen and examined on 10/29/2022  Brief hospital course: 82 year old male PMH including atrial fibrillation on apixaban, diverticulitis, presented with abdominal pain, fluid for acute sigmoid diverticulitis with contained perforation/abscess.  Consultants General surgery IR  Procedures 1/9 Image guided drain placement, LLQ, peri-colonic abscess.    Assessment and Plan: Acute sigmoid diverticulitis with abscess. SP drain placement. General surgery following. Continue IV antibiotic. Found to have yeast and currently also now on IV Diflucan. Per surgery as long as remains stable tomorrow patient can be discharged home on oral antibiotics with drain in place.  Family being educated about drain management.  Chronic A-fib. Eliquis on hold for now.  Given that the is to discharge home tomorrow I will be holding off on IV heparin. Resume Eliquis on discharge as long as cleared by surgery.  AKI on CKD 3A. Renal function stable. Monitor   HTN. Blood pressure stable. Holding off oral regimens.   Subjective: No abdominal pain.  No nausea no vomiting.  Passing gas.  Had a BM.  Physical Exam: General: in Mild distress, No Rash Cardiovascular: S1 and S2 Present, No Murmur Respiratory: Good respiratory effort, Bilateral Air entry present. No Crackles, No wheezes Abdomen: Bowel Sound present, No tenderness Extremities: No edema Neuro: Alert and oriented x3, no new focal deficit   Data Reviewed: I have Reviewed nursing notes, Vitals, and Lab results. Since last encounter, pertinent lab results CBC and BMP   . I have ordered test including CBC and BMP  .    Disposition: Status is: Inpatient Remains inpatient appropriate because: Need for IV antibiotics and further monitoring of drain output  SCDs Start: 10/25/22 2056   Family Communication: Family at  bedside Level of care: Med-Surg Continue MedSurg Vitals:   10/28/22 1330 10/28/22 1945 10/29/22 0546 10/29/22 1351  BP: 123/73 130/64 118/67 138/66  Pulse: (!) 57 (!) 59 61 (!) 46  Resp: '17 16 16 18  '$ Temp: (!) 97.5 F (36.4 C) (!) 97.5 F (36.4 C) (!) 97.4 F (36.3 C) 97.7 F (36.5 C)  TempSrc: Axillary Oral Oral Oral  SpO2: (!) 79% 100% 99% 99%  Weight:      Height:         Author: Berle Mull, MD 10/29/2022 7:47 PM  Please look on [email or web address] to find out who is on call.

## 2022-10-29 NOTE — Progress Notes (Signed)
Physical Therapy Treatment Patient Details Name: Ian Estes MRN: [id number] DOB: 1941/09/15 Today's Date: 10/29/2022   History of Present Illness 82 y.o. male  presents to the ED for evaluation of abdominal pain. dx with Acute sigmoid diverticulitis with contained perforation/abscess, General surgery consulted. drain placed per IR on 10/27/22.  PMH: chronic atrial fibrillation on Eliquis, CKD stage IIIa, HTN, HLD, history of diverticulitis w    PT Comments    Patient required moderate encouragement to participate in therapy this session. Pt ambulated ~100' with min guard for safety and cues for IV management. Attempted to educate pt on exercises for LE strengthening however pt declined to participate in seated exercises despite encouragement. Pt returned to spine with call bell and needs met.    Recommendations for follow up therapy are one component of a multi-disciplinary discharge planning process, led by the attending physician.  Recommendations may be updated based on patient status, additional functional criteria and insurance authorization.  Follow Up Recommendations  No PT follow up     Assistance Recommended at Discharge Intermittent Supervision/Assistance  Patient can return home with the following Help with stairs or ramp for entrance;Assist for transportation;Assistance with cooking/housework   Equipment Recommendations  None recommended by PT    Recommendations for Other Services       Precautions / Restrictions Precautions Precautions: Fall Restrictions Weight Bearing Restrictions: No Other Position/Activity Restrictions: drain LLQ     Mobility  Bed Mobility Overal bed mobility: Needs Assistance Bed Mobility: Supine to Sit, Sit to Supine     Supine to sit: Supervision Sit to supine: Supervision   General bed mobility comments: for safety and line management    Transfers Overall transfer level: Needs assistance Equipment used: None Transfers: Sit  to/from Stand Sit to Stand: Min guard           General transfer comment: wide BOS, unsteady on initial stand, min/guard for safety    Ambulation/Gait Ambulation/Gait assistance: Min guard Gait Distance (Feet): 100 Feet Assistive device: None, IV Pole Gait Pattern/deviations: Step-through pattern, Decreased stride length, Wide base of support Gait velocity: decr     General Gait Details: unsteady however without overt LOB; min guard for safety and IV pole position; pt transitioned to bil UE support on IV as he fatigued towards end of gait   Stairs             Wheelchair Mobility    Modified Rankin (Stroke Patients Only)       Balance Overall balance assessment: Mild deficits observed, not formally tested                                          Cognition Arousal/Alertness: Awake/alert Behavior During Therapy: WFL for tasks assessed/performed Overall Cognitive Status: Within Functional Limits for tasks assessed                                          Exercises      General Comments        Pertinent Vitals/Pain      Home Living                          Prior Function            PT  Goals (current goals can now be found in the care plan section) Acute Rehab PT Goals Patient Stated Goal: "to go home but not until dr is ready " PT Goal Formulation: With patient Time For Goal Achievement: 11/10/22 Potential to Achieve Goals: Good Progress towards PT goals: Progressing toward goals    Frequency    Min 3X/week      PT Plan Current plan remains appropriate    Co-evaluation              AM-PAC PT "6 Clicks" Mobility   Outcome Measure  Help needed turning from your back to your side while in a flat bed without using bedrails?: None Help needed moving from lying on your back to sitting on the side of a flat bed without using bedrails?: None Help needed moving to and from a bed to a chair  (including a wheelchair)?: A Little Help needed standing up from a chair using your arms (e.g., wheelchair or bedside chair)?: A Little Help needed to walk in hospital room?: A Little Help needed climbing 3-5 steps with a railing? : A Little 6 Click Score: 20    End of Session Equipment Utilized During Treatment: Gait belt Activity Tolerance: Patient tolerated treatment well Patient left: in bed;with call bell/phone within reach;with bed alarm set Nurse Communication: Mobility status PT Visit Diagnosis: Other abnormalities of gait and mobility (R26.89);Difficulty in walking, not elsewhere classified (R26.2)     Time: 5621-3086 PT Time Calculation (min) (ACUTE ONLY): 16 min  Charges:  $Gait Training: 8-22 mins                     Verner Mould, DPT Acute Rehabilitation Services Office [phone]  10/29/22 3:53 PM

## 2022-10-29 NOTE — Progress Notes (Signed)
Drain teaching done with patient and patients son. They were shown how to empty and flush drain. They were told it is best to record output and what the drainage looks like. Patients son is comfortable with drain care as his wife has had one similar in the past.  Opportunity given for questions, all questions were answered.   Dewayne Hatch, RN

## 2022-10-29 NOTE — Plan of Care (Signed)

## 2022-10-29 NOTE — Progress Notes (Signed)
[facility] Progress Note     Subjective: CC-  Feeling better today. He reports minimal LLQ soreness. No n/v. Tolerating full liquids. BM this morning.  Objective: Vital signs in last 24 hours: Temp:  [97.4 F (36.3 C)-97.5 F (36.4 C)] 97.4 F (36.3 C) (01/11 0546) Pulse Rate:  [57-61] 61 (01/11 0546) Resp:  [16-17] 16 (01/11 0546) BP: (118-130)/(64-73) 118/67 (01/11 0546) SpO2:  [79 %-100 %] 99 % (01/11 0546) Last BM Date : 10/27/22  Intake/Output from previous day: 01/10 0701 - 01/11 0700 In: 1811 [P.O.:476; I.V.:1225; IV Piggyback:100] Out: 225 [Drains:225] Intake/Output this shift: Total I/O In: 220 [P.O.:220] Out: -   PE: Gen:  Alert, NAD, pleasant Pulm: rate and effort normal on room air Abd: soft, ND, mild subjective LLQ TTP, drain with thin brown fluid and sediment in bag  Lab Results:  Recent Labs    10/27/22 0809 10/28/22 0353  WBC 14.9* 9.1  HGB 10.9* 10.3*  HCT 35.1* 32.6*  PLT 193 214   BMET Recent Labs    10/28/22 0353 10/29/22 0416  NA 134* 135  K 3.8 4.7  CL 107 109  CO2 21* 21*  GLUCOSE 101* 105*  BUN 19 14  CREATININE 1.35* 1.19  CALCIUM 7.7* 7.8*   PT/INR No results for input(s): "LABPROT", "INR" in the last 72 hours. CMP     Component Value Date/Time   NA 135 10/29/2022 0416   NA 138 01/12/2019 0852   K 4.7 10/29/2022 0416   CL 109 10/29/2022 0416   CO2 21 (L) 10/29/2022 0416   GLUCOSE 105 (H) 10/29/2022 0416   BUN 14 10/29/2022 0416   BUN 20 01/12/2019 0852   CREATININE 1.19 10/29/2022 0416   CALCIUM 7.8 (L) 10/29/2022 0416   PROT 7.1 10/25/2022 1425   ALBUMIN 2.2 (L) 10/29/2022 0416   AST 16 10/25/2022 1425   ALT 9 10/25/2022 1425   ALKPHOS 143 (H) 10/25/2022 1425   BILITOT 0.9 10/25/2022 1425   GFRNONAA >60 10/29/2022 0416   GFRAA 63 01/12/2019 0852   Lipase     Component Value Date/Time   LIPASE <10 (L) 10/25/2022 1425       Studies/Results: CT IMAGE GUIDE DRAIN TRANSVAG TRANSRECT  PERITONEAL RETROPER  Result Date: 10/27/2022 INDICATION: 82 year old male referred for drainage diverticular abscess EXAM: CT GUIDED DRAINAGE OF  ABSCESS MEDICATIONS: The patient is currently admitted to the hospital and receiving intravenous antibiotics. The antibiotics were administered within an appropriate time frame prior to the initiation of the procedure. ANESTHESIA/SEDATION: 1.0 mg IV Versed 50 mcg IV Fentanyl Moderate Sedation Time:  18 minutes The patient was continuously monitored during the procedure by the interventional radiology nurse under my direct supervision. COMPLICATIONS: None TECHNIQUE: Informed written consent was obtained from the patient after a thorough discussion of the procedural risks, benefits and alternatives. All questions were addressed. Maximal Sterile Barrier Technique was utilized including caps, mask, sterile gowns, sterile gloves, sterile drape, hand hygiene and skin antiseptic. A timeout was performed prior to the initiation of the procedure. PROCEDURE: Patient was position left anterior oblique on the CT gantry table. Scout CT acquired for planning purposes. The left lower abdominal wall was prepped with chlorhexidine in a sterile fashion, and a sterile drape was applied covering the operative field. A sterile gown and sterile gloves were used for the procedure. Local anesthesia was provided with 1% Lidocaine. Using CT guidance, trocar needle was advanced to the abscess within the central abdomen. Modified Seldinger technique was used  to place a 10 [place] drain. Proximally 30 cc of dark thin fluid with internal complexity aspirated. Drain was sutured in position and attached to gravity drainage. Final CT was acquired. Patient tolerated the procedure well and remained hemodynamically stable throughout. No complications were encountered and no significant blood loss. FINDINGS: Abscess within the central low abdomen. Ten French drain within the central abscess. Sample sent for  culture. IMPRESSION: Status post CT-guided drainage of abdominal abscess. Signed, Dulcy Fanny. Nadene Rubins, RPVI [facility] Electronically Signed   By: Corrie Mckusick D.O.   On: 10/27/2022 16:20    Anti-infectives: Anti-infectives (From admission, onward)    Start     Dose/Rate Route Frequency Ordered Stop   10/28/22 0800  fluconazole (DIFLUCAN) IVPB 200 mg        200 mg 100 mL/hr over 60 Minutes Intravenous Every 24 hours 10/28/22 0729     10/26/22 1400  piperacillin-tazobactam (ZOSYN) IVPB 3.375 g  Status:  Discontinued        3.375 g 100 mL/hr over 30 Minutes Intravenous Every 8 hours 10/26/22 0935 10/26/22 0940   10/26/22 1200  piperacillin-tazobactam (ZOSYN) IVPB 3.375 g        3.375 g 12.5 mL/hr over 240 Minutes Intravenous Every 8 hours 10/26/22 0940     10/25/22 2200  cefTRIAXone (ROCEPHIN) 2 g in sodium chloride 0.9 % 100 mL IVPB  Status:  Discontinued        2 g 200 mL/hr over 30 Minutes Intravenous Every 24 hours 10/25/22 2059 10/26/22 0935   10/25/22 2200  metroNIDAZOLE (FLAGYL) IVPB 500 mg  Status:  Discontinued        500 mg 100 mL/hr over 60 Minutes Intravenous Every 12 hours 10/25/22 2059 10/26/22 0935   10/25/22 1700  Ampicillin-Sulbactam (UNASYN) 3 g in sodium chloride 0.9 % 100 mL IVPB        3 g 200 mL/hr over 30 Minutes Intravenous  Once 10/25/22 1651 10/25/22 1806        Assessment/Plan Sigmoid diverticulitis with contained perforation/ abscess - 2nd bout requiring admission, first bout was 5 years ago and resolved with antibiotics. Last colonoscopy was about 7 years ago with Dr. Collene Mares - CT abdomen/pelvis 1/7 with acute sigmoid diverticulitis complicated by a contained perforation/ abscess in the adjacent mesentery which measures 6.8 x4.7 x 5.2 cm; bowel wall thickening and associated inflammatory changes of the ascending colon likely reflect an infectious/ inflammatory colitis - s/p IR drain 1/9. Gram  stain with GPR, GPC, and few yeast, follow culture. Monitor drain output, concerning for possible fistula - Advance to soft diet. Continue IV zosyn and diflucan. If he continues to improve he may be ready for discharge tomorrow with drain and oral antibiotics. He will need IR, GI, and CCS follow up.    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

## 2022-10-30 ENCOUNTER — Other Ambulatory Visit: Payer: Self-pay | Admitting: General Surgery

## 2022-10-30 ENCOUNTER — Other Ambulatory Visit (HOSPITAL_COMMUNITY): Payer: Self-pay | Admitting: Radiology

## 2022-10-30 DIAGNOSIS — K5720 Diverticulitis of large intestine with perforation and abscess without bleeding: Secondary | ICD-10-CM | POA: Diagnosis not present

## 2022-10-30 LAB — RENAL FUNCTION PANEL
Albumin: 2 g/dL — ABNORMAL LOW (ref 3.5–5.0)
Anion gap: 9 (ref 5–15)
BUN: 9 mg/dL (ref 8–23)
CO2: 19 mmol/L — ABNORMAL LOW (ref 22–32)
Calcium: 7.8 mg/dL — ABNORMAL LOW (ref 8.9–10.3)
Chloride: 107 mmol/L (ref 98–111)
Creatinine, Ser: 1.14 mg/dL (ref 0.61–1.24)
GFR, Estimated: >60 mL/min (ref >60)
Glucose, Bld: 95 mg/dL (ref 70–99)
Phosphorus: 2.9 mg/dL (ref 2.5–4.6)
Potassium: 4.9 mmol/L (ref 3.5–5.1)
Sodium: 135 mmol/L (ref 135–145)

## 2022-10-30 LAB — CBC
HCT: 33.7 % — ABNORMAL LOW (ref 39.0–52.0)
Hemoglobin: 10.4 g/dL — ABNORMAL LOW (ref 13.0–17.0)
MCH: 27.4 pg (ref 26.0–34.0)
MCHC: 30.9 g/dL (ref 30.0–36.0)
MCV: 88.7 fL (ref 80.0–100.0)
Platelets: 248 10*3/uL (ref 150–400)
RBC: 3.8 MIL/uL — ABNORMAL LOW (ref 4.22–5.81)
RDW: 14.3 % (ref 11.5–15.5)
WBC: 5.8 10*3/uL (ref 4.0–10.5)
nRBC: 0 % (ref 0.0–0.2)

## 2022-10-30 LAB — CULTURE, BLOOD (ROUTINE X 2)
Culture: NO GROWTH
Culture: NO GROWTH
Special Requests: ADEQUATE

## 2022-10-30 LAB — MAGNESIUM: Magnesium: 1.7 mg/dL (ref 1.7–2.4)

## 2022-10-30 MED ORDER — FLUCONAZOLE 200 MG PO TABS
200.0000 mg | ORAL_TABLET | Freq: Every day | ORAL | Status: DC
  Filled 2022-10-30: qty 1

## 2022-10-30 MED ORDER — FLUCONAZOLE 200 MG PO TABS: 200.0000 mg | ORAL_TABLET | Freq: Every day | ORAL | 0 refills | Status: DC

## 2022-10-30 MED ORDER — AMOXICILLIN-POT CLAVULANATE 875-125 MG PO TABS: 1.0000 | ORAL_TABLET | Freq: Two times a day (BID) | ORAL | 0 refills | Status: DC

## 2022-10-30 MED ORDER — FUROSEMIDE 20 MG PO TABS: 20.0000 mg | ORAL_TABLET | Freq: Every day | ORAL | 0 refills | Status: DC | PRN

## 2022-10-30 MED ORDER — TRAMADOL HCL 50 MG PO TABS: 50.0000 mg | ORAL_TABLET | Freq: Three times a day (TID) | ORAL | 0 refills | Status: DC | PRN

## 2022-10-30 MED ORDER — AMOXICILLIN-POT CLAVULANATE 875-125 MG PO TABS
1.0000 | ORAL_TABLET | Freq: Two times a day (BID) | ORAL | Status: DC
  Filled 2022-10-30: qty 1

## 2022-10-30 MED ORDER — ACETAMINOPHEN 325 MG PO TABS: 650.0000 mg | ORAL_TABLET | Freq: Four times a day (QID) | ORAL | Status: DC | PRN

## 2022-10-30 MED ORDER — MAGNESIUM SULFATE 2 GM/50ML IV SOLN
2.0000 g | Freq: Once | INTRAVENOUS | Status: DC
  Filled 2022-10-30: qty 50

## 2022-10-30 NOTE — Progress Notes (Signed)
[facility] Progress Note     Subjective: CC-  A little sore at drain site still but feeling better. Tolerating soft diet. Denies n/v. Feels comfortable with drain care with the help of his son.  WBC 5.8, afebrile  Objective: Vital signs in last 24 hours: Temp:  [97.7 F (36.5 C)-98.5 F (36.9 C)] 97.7 F (36.5 C) (01/12 0447) Pulse Rate:  [46-70] 59 (01/12 0447) Resp:  [18-19] 18 (01/12 0447) BP: (131-138)/(64-74) 131/64 (01/12 0447) SpO2:  [97 %-99 %] 97 % (01/12 0447) Last BM Date : 10/29/22  Intake/Output from previous day: 01/11 0701 - 01/12 0700 In: 531.9 [P.O.:340; I.V.:10; IV Piggyback:161.9] Out: 140 [Drains:140] Intake/Output this shift: No intake/output data recorded.  PE: Gen:  Alert, NAD, pleasant Pulm: rate and effort normal on room air Abd: soft, ND, mild TTP around drain, drain with thin brown fluid and sediment in bag  Lab Results:  Recent Labs    10/29/22 0948 10/30/22 0536  WBC 7.8 5.8  HGB 10.4* 10.4*  HCT 34.0* 33.7*  PLT 243 248   BMET Recent Labs    10/29/22 0416 10/30/22 0352  NA 135 135  K 4.7 4.9  CL 109 107  CO2 21* 19*  GLUCOSE 105* 95  BUN 14 9  CREATININE 1.19 1.14  CALCIUM 7.8* 7.8*   PT/INR No results for input(s): "LABPROT", "INR" in the last 72 hours. CMP     Component Value Date/Time   NA 135 10/30/2022 0352   NA 138 01/12/2019 0852   K 4.9 10/30/2022 0352   CL 107 10/30/2022 0352   CO2 19 (L) 10/30/2022 0352   GLUCOSE 95 10/30/2022 0352   BUN 9 10/30/2022 0352   BUN 20 01/12/2019 0852   CREATININE 1.14 10/30/2022 0352   CALCIUM 7.8 (L) 10/30/2022 0352   PROT 7.1 10/25/2022 1425   ALBUMIN 2.0 (L) 10/30/2022 0352   AST 16 10/25/2022 1425   ALT 9 10/25/2022 1425   ALKPHOS 143 (H) 10/25/2022 1425   BILITOT 0.9 10/25/2022 1425   GFRNONAA >60 10/30/2022 0352   GFRAA 63 01/12/2019 0852   Lipase     Component Value Date/Time   LIPASE <10 (L) 10/25/2022 1425       Studies/Results: No  results found.  Anti-infectives: Anti-infectives (From admission, onward)    Start     Dose/Rate Route Frequency Ordered Stop   10/28/22 0800  fluconazole (DIFLUCAN) IVPB 200 mg        200 mg 100 mL/hr over 60 Minutes Intravenous Every 24 hours 10/28/22 0729     10/26/22 1400  piperacillin-tazobactam (ZOSYN) IVPB 3.375 g  Status:  Discontinued        3.375 g 100 mL/hr over 30 Minutes Intravenous Every 8 hours 10/26/22 0935 10/26/22 0940   10/26/22 1200  piperacillin-tazobactam (ZOSYN) IVPB 3.375 g        3.375 g 12.5 mL/hr over 240 Minutes Intravenous Every 8 hours 10/26/22 0940     10/25/22 2200  cefTRIAXone (ROCEPHIN) 2 g in sodium chloride 0.9 % 100 mL IVPB  Status:  Discontinued        2 g 200 mL/hr over 30 Minutes Intravenous Every 24 hours 10/25/22 2059 10/26/22 0935   10/25/22 2200  metroNIDAZOLE (FLAGYL) IVPB 500 mg  Status:  Discontinued        500 mg 100 mL/hr over 60 Minutes Intravenous Every 12 hours 10/25/22 2059 10/26/22 0935   10/25/22 1700  Ampicillin-Sulbactam (UNASYN) 3 g in sodium chloride 0.9 %  100 mL IVPB        3 g 200 mL/hr over 30 Minutes Intravenous  Once 10/25/22 1651 10/25/22 1806        Assessment/Plan Sigmoid diverticulitis with contained perforation/ abscess - 2nd bout requiring admission, first bout was 5 years ago and resolved with antibiotics. Last colonoscopy was about 7 years ago with Dr. Collene Mares - CT abdomen/pelvis 1/7 with acute sigmoid diverticulitis complicated by a contained perforation/ abscess in the adjacent mesentery which measures 6.8 x4.7 x 5.2 cm; bowel wall thickening and associated inflammatory changes of the ascending colon likely reflect an infectious/ inflammatory colitis - s/p IR drain 1/9. Gram stain with GPR, GPC, and few yeast; culture with multiple organisms and none predominant. Monitor drain output, concerning for possible fistula - Patient is ok for discharge today from surgical standpoint. He should go home with the drain and  follow up with IR. Discussed following up with Dr. Collene Mares as well for repeat colonoscopy in 6-8 weeks. I will arrange follow up in our office with Dr. Redmond Pulling. Recommend 2 total weeks of antibiotics (ok to switch to augmentin and diflucan). Ok to resume eliquis upon discharge.   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    LOS: 5 days    [facility] 10/30/2022, 8:53 AM Please see Amion for pager number during day hours 7:00am-4:30pm

## 2022-10-30 NOTE — Plan of Care (Signed)
  Problem: Education: Goal: Knowledge of General Education information will improve Description: Including pain rating scale, medication(s)/side effects and non-pharmacologic comfort measures 10/30/2022 1115 by Linus Salmons, RN Outcome: Adequate for Discharge 10/30/2022 1115 by Linus Salmons, RN Outcome: Progressing   Problem: Health Behavior/Discharge Planning: Goal: Ability to manage health-related needs will improve 10/30/2022 1115 by Linus Salmons, RN Outcome: Adequate for Discharge 10/30/2022 1115 by Linus Salmons, RN Outcome: Progressing   Problem: Clinical Measurements: Goal: Ability to maintain clinical measurements within normal limits will improve 10/30/2022 1115 by Linus Salmons, RN Outcome: Adequate for Discharge 10/30/2022 1115 by Linus Salmons, RN Outcome: Progressing Goal: Will remain free from infection 10/30/2022 1115 by Linus Salmons, RN Outcome: Adequate for Discharge 10/30/2022 1115 by Linus Salmons, RN Outcome: Progressing Goal: Diagnostic test results will improve 10/30/2022 1115 by Linus Salmons, RN Outcome: Adequate for Discharge 10/30/2022 1115 by Linus Salmons, RN Outcome: Progressing Goal: Respiratory complications will improve 10/30/2022 1115 by Linus Salmons, RN Outcome: Adequate for Discharge 10/30/2022 1115 by Linus Salmons, RN Outcome: Progressing Goal: Cardiovascular complication will be avoided 10/30/2022 1115 by Linus Salmons, RN Outcome: Adequate for Discharge 10/30/2022 1115 by Linus Salmons, RN Outcome: Progressing   Problem: Activity: Goal: Risk for activity intolerance will decrease 10/30/2022 1115 by Linus Salmons, RN Outcome: Adequate for Discharge 10/30/2022 1115 by Linus Salmons, RN Outcome: Progressing   Problem: Nutrition: Goal: Adequate nutrition will be maintained 10/30/2022 1115 by Linus Salmons, RN Outcome: Adequate for Discharge 10/30/2022 1115 by Linus Salmons, RN Outcome: Progressing   Problem:  Coping: Goal: Level of anxiety will decrease 10/30/2022 1115 by Linus Salmons, RN Outcome: Adequate for Discharge 10/30/2022 1115 by Linus Salmons, RN Outcome: Progressing   Problem: Elimination: Goal: Will not experience complications related to bowel motility 10/30/2022 1115 by Linus Salmons, RN Outcome: Adequate for Discharge 10/30/2022 1115 by Linus Salmons, RN Outcome: Progressing Goal: Will not experience complications related to urinary retention 10/30/2022 1115 by Linus Salmons, RN Outcome: Adequate for Discharge 10/30/2022 1115 by Linus Salmons, RN Outcome: Progressing   Problem: Pain Managment: Goal: General experience of comfort will improve 10/30/2022 1115 by Linus Salmons, RN Outcome: Adequate for Discharge 10/30/2022 1115 by Linus Salmons, RN Outcome: Progressing   Problem: Safety: Goal: Ability to remain free from injury will improve 10/30/2022 1115 by Linus Salmons, RN Outcome: Adequate for Discharge 10/30/2022 1115 by Linus Salmons, RN Outcome: Progressing   Problem: Skin Integrity: Goal: Risk for impaired skin integrity will decrease 10/30/2022 1115 by Linus Salmons, RN Outcome: Adequate for Discharge 10/30/2022 1115 by Linus Salmons, RN Outcome: Progressing   Problem: Acute Rehab PT Goals(only PT should resolve) Goal: Pt Will Go Supine/Side To Sit Outcome: Adequate for Discharge Goal: Patient Will Transfer Sit To/From Stand Outcome: Adequate for Discharge Goal: Pt Will Ambulate Outcome: Adequate for Discharge

## 2022-10-30 NOTE — Plan of Care (Signed)

## 2022-10-31 NOTE — Discharge Summary (Signed)
Physician Discharge Summary   Patient: Ian Estes MRN: [id number] DOB: 03-13-1941  Admit date:     10/25/2022  Discharge date: 10/30/2022  Discharge Physician: Berle Mull  PCP: Aletha Halim., PA-C  Recommendations at discharge: Follow-up with PCP.  Follow-up with GI.  Follow-up with IR.  Follow-up with general surgery as recommended.   Follow-up Information     Juanita Craver, MD. Call.   Specialty: Gastroenterology Why: Call to arranage follow up with your gastroenterologist after discharge to discuss repeat colonoscopy in the next 6-8 weeks Contact information: [address] [place] [address] [place], [place], DO. Call.   Specialties: Interventional Radiology, Radiology Why: Call for follow up in drain clinic Contact information: [address] [place] [address] [phone]         Greer Pickerel, MD. Go on 11/19/2022.   Specialty: General Surgery Why: Your appointment is 2/1 at [place] [facility]mn early to check in, fill out paperwork, Bring photo ID and insurance information Contact information: [phone]         KAletha Halim, PA-C. Schedule an appointment as soon as possible for a visit in 2 week(s).   Specialty: Family Medicine Contact information: [address] [phone]               Discharge Diagnoses: Principal Problem:   Diverticulitis of intestine with perforation and abscess Active Problems:   Chronic atrial fibrillation ([facility]   Chronic kidney disease, stage 3a ([facility]   Hypertension   Hyperlipidemia  Hospital Course: 82 year old male PMH including atrial fibrillation on apixaban, diverticulitis, presented with abdominal pain, fluid for acute sigmoid diverticulitis with contained perforation/abscess.  Consultants General surgery IR  Procedures 1/9 Image guided drain placement, LLQ,  peri-colonic abscess.    Assessment and Plan  Acute sigmoid diverticulitis with abscess. S/P drain placement. General surgery following. Treated with IV Zosyn.  On discharge patient will be on oral Augmentin. Found to have yeast and currently also now on IV Diflucan.  Will continue oral Diflucan on discharge as well. Continue soft diet. Drain education provided.  Chronic A-fib. Resume Eliquis on discharge.     AKI on CKD 3A. Renal function stable. Monitor    HTN. Blood pressure stable. Not on any medication.  Bilateral lower extremity edema. Mild volume overload suspected. Recommend Lasix as needed.  Pain control - [place] was reviewed. and patient was instructed, not to drive, operate heavy machinery, perform activities at heights, swimming or participation in water activities or provide baby-sitting services while on Pain, Sleep and Anxiety Medications; until their outpatient Physician has advised to do so again. Also recommended to not to take more than prescribed Pain, Sleep and Anxiety Medications.  Consultants:  General surgery  IR  Procedures performed:  IR guided drain placement  DISCHARGE MEDICATION: Allergies as of 10/30/2022   No Known Allergies      Medication List     STOP taking these medications    amLODipine 5 MG tablet Commonly known as: NORVASC   metoprolol succinate 25 MG 24 hr tablet Commonly known as: TOPROL-XL       TAKE these medications    acetaminophen 325 MG tablet Commonly known as: TYLENOL Take 2 tablets (650 mg total) by mouth every 6 (six) hours as needed for mild pain, fever or headache.  allopurinol 100 MG tablet Commonly known as: ZYLOPRIM Take 100 mg by mouth daily.   amoxicillin-clavulanate 875-125 MG tablet Commonly known as: AUGMENTIN Take 1 tablet by mouth 2 (two) times daily for 10 days.   apixaban 5 MG Tabs tablet Commonly known as: Eliquis Take 1 tablet  (5 mg total) by mouth 2 (two) times daily.   atorvastatin 20 MG tablet Commonly known as: LIPITOR Take 20 mg by mouth daily.   clonazePAM 0.5 MG tablet Commonly known as: KLONOPIN Take 0.5 mg by mouth daily.   fluconazole 200 MG tablet Commonly known as: DIFLUCAN Take 1 tablet (200 mg total) by mouth daily for 12 days.   folic acid 1 MG tablet Commonly known as: FOLVITE Take 1 tablet (1 mg total) by mouth daily.   furosemide 20 MG tablet Commonly known as: Lasix Take 1 tablet (20 mg total) by mouth daily as needed (for weight gain of >3 lbs in 1 day or >5 lbs in 1 week or for edema in legs).   pantoprazole 40 MG tablet Commonly known as: PROTONIX Take 40 mg by mouth daily.   saccharomyces boulardii 250 MG capsule Commonly known as: FLORASTOR Take 1 capsule (250 mg total) by mouth 2 (two) times daily.   traMADol 50 MG tablet Commonly known as: Ultram Take 1 tablet (50 mg total) by mouth every 8 (eight) hours as needed for moderate pain or severe pain.               Discharge Care Instructions  (From admission, onward)           Start     Ordered   10/30/22 0000  Discharge wound care:       Comments: [place]ushing regimen: Normal Saline 5 mL three times daily.   10/30/22 0924           Disposition: Home Diet recommendation: Soft diet  Discharge Exam: Vitals:   10/29/22 0546 10/29/22 1351 10/29/22 1953 10/30/22 0447  BP: 118/67 138/66 133/74 131/64  Pulse: 61 (!) 46 70 (!) 59  Resp: '16 18 19 18  '$ Temp: (!) 97.4 F (36.3 C) 97.7 F (36.5 C) 98.5 F (36.9 C) 97.7 F (36.5 C)  TempSrc: Oral Oral Oral Oral  SpO2: 99% 99% 97% 97%  Weight:      Height:       General: Appear in mild distress; no visible Abnormal Neck Mass Or lumps, Conjunctiva normal Cardiovascular: S1 and S2 Present, no Murmur, Respiratory: good respiratory effort, Bilateral Air entry present and CTA, no Crackles, no wheezes Abdomen: Bowel Sound present, Non tender  Extremities:  bilateral  Pedal edema Neurology: alert and oriented to time, place, and person  [facility] Weights   10/25/22 2037  Weight: 95.8 kg   Condition at discharge: stable  The results of significant diagnostics from this hospitalization (including imaging, microbiology, ancillary and laboratory) are listed below for reference.   Imaging Studies: CT IMAGE GUIDE DRAIN TRANSVAG TRANSRECT PERITONEAL RETROPER  Result Date: 10/27/2022 INDICATION: 82 year old male referred for drainage diverticular abscess EXAM: CT GUIDED DRAINAGE OF  ABSCESS MEDICATIONS: The patient is currently admitted to the hospital and receiving intravenous antibiotics. The antibiotics were administered within an appropriate time frame prior to the initiation of the procedure. ANESTHESIA/SEDATION: 1.0 mg IV Versed 50 mcg IV Fentanyl Moderate Sedation Time:  18 minutes The patient was continuously monitored during the procedure by the interventional radiology nurse under my direct supervision. COMPLICATIONS: None TECHNIQUE: Informed written consent was obtained from the  patient after a thorough discussion of the procedural risks, benefits and alternatives. All questions were addressed. Maximal Sterile Barrier Technique was utilized including caps, mask, sterile gowns, sterile gloves, sterile drape, hand hygiene and skin antiseptic. A timeout was performed prior to the initiation of the procedure. PROCEDURE: Patient was position left anterior oblique on the CT gantry table. Scout CT acquired for planning purposes. The left lower abdominal wall was prepped with chlorhexidine in a sterile fashion, and a sterile drape was applied covering the operative field. A sterile gown and sterile gloves were used for the procedure. Local anesthesia was provided with 1% Lidocaine. Using CT guidance, trocar needle was advanced to the abscess within the central abdomen. Modified Seldinger technique was used to place a 10 [place] drain. Proximally 30 cc of dark thin  fluid with internal complexity aspirated. Drain was sutured in position and attached to gravity drainage. Final CT was acquired. Patient tolerated the procedure well and remained hemodynamically stable throughout. No complications were encountered and no significant blood loss. FINDINGS: Abscess within the central low abdomen. Ten French drain within the central abscess. Sample sent for culture. IMPRESSION: Status post CT-guided drainage of abdominal abscess. Signed, Dulcy Fanny. Nadene Rubins, RPVI [facility] Radiology Electronically Signed   By: Corrie Mckusick D.O.   On: 10/27/2022 16:20   CT ABDOMEN PELVIS W CONTRAST  Result Date: 10/25/2022 CLINICAL DATA:  Acute abdominal pain. EXAM: CT ABDOMEN AND PELVIS WITH CONTRAST TECHNIQUE: Multidetector CT imaging of the abdomen and pelvis was performed using the standard protocol following bolus administration of intravenous contrast. RADIATION DOSE REDUCTION: This exam was performed according to the departmental dose-optimization program which includes automated exposure control, adjustment of the mA and/or kV according to patient size and/or use of iterative reconstruction technique. CONTRAST:  140m OMNIPAQUE IOHEXOL 300 MG/ML  SOLN COMPARISON:  CT abdomen pelvis dated 12/20/2018. FINDINGS: Lower chest: No acute abnormality. Hepatobiliary: No focal liver abnormality is seen. No gallstones, gallbladder wall thickening, or biliary dilatation. Pancreas: Unremarkable. No pancreatic ductal dilatation or surrounding inflammatory changes. Spleen: Normal in size without focal abnormality. Adrenals/Urinary Tract: Adrenal glands are unremarkable. Other than a 1.7 cm cyst in the left kidney, the kidneys are normal, without renal calculi, solid focal lesion, or hydronephrosis. No imaging follow-up is recommended for the left renal cyst. Bladder is unremarkable. Stomach/Bowel: Stomach is within normal limits. There is sigmoid  diverticulosis with superimposed bowel wall thickening and inflammatory changes with a contained perforation/abscess in the adjacent mesentery which measures 6.8 x 4.7 x 5.2 cm (series 5, image 69 and series 2, image 73). There is bowel wall thickening and mild associated fat stranding involving the ascending colon (series 2, images 63-71) the appendix appears normal. Evidence of bowel obstruction. Vascular/Lymphatic: Aortic atherosclerosis. An enlarged lymph node in the mesentery is likely reactive (series 5, image 75). Reproductive: The prostate is absent. Other: No abdominal wall hernia or abnormality. No abdominopelvic ascites. Musculoskeletal: Degenerative changes are seen in the spine. IMPRESSION: 1. Acute sigmoid diverticulitis complicated by a contained perforation/abscess in the adjacent mesentery which measures 6.8 x 4.7 x 5.2 cm. 2. Bowel wall thickening and associated inflammatory changes of the ascending colon likely reflect an infectious/inflammatory colitis. Aortic Atherosclerosis (ICD10-I70.0). Electronically Signed   By: TZerita BoersM.D.   On: 10/25/2022 16:45    Microbiology: Results for orders placed or performed during the hospital encounter of 10/25/22  Culture, blood (Routine X 2) w Reflex to ID Panel  Status: None   Collection Time: 10/25/22  9:08 PM   Specimen: BLOOD  Result Value Ref Range Status   Specimen Description   Final    BLOOD BLOOD RIGHT ARM forearm Performed at [facility] [address] [place], [name] [address]    Special Requests   Final    BOTTLES DRAWN AEROBIC AND ANAEROBIC Blood Culture results may not be optimal due to an inadequate volume of blood received in culture bottles Performed at [facility] [address], [place], [place] [address]    Culture   Final    NO GROWTH 5 DAYS Performed at [facility], [place] [address], [place], [place] [address]    Report Status 10/30/2022 FINAL  Final  Culture,  blood (Routine X 2) w Reflex to ID Panel     Status: None   Collection Time: 10/25/22  9:08 PM   Specimen: BLOOD  Result Value Ref Range Status   Specimen Description   Final    BLOOD BLOOD RIGHT HAND Performed at [facility] [address] [place], [place] [address]    Special Requests   Final    BOTTLES DRAWN AEROBIC AND ANAEROBIC Blood Culture adequate volume Performed at [facility] [address], [place], [place] [address]    Culture   Final    NO GROWTH 5 DAYS Performed at [facility], [place] [address], [place], [place] [address]    Report Status 10/30/2022 FINAL  Final  Aerobic/Anaerobic Culture w Gram Stain (surgical/deep wound)     Status: Abnormal   Collection Time: 10/27/22  2:34 PM   Specimen: Abdomen; Abscess  Result Value Ref Range Status   Specimen Description   Final    ABDOMEN Performed at [facility] [address], [place], [place] [address]    Special Requests   Final    NONE Performed at [facility], [place] [address] [place], [place] [address]    Gram Stain   Final    NO WBC SEEN FEW YEAST WITH PSEUDOHYPHAE ABUNDANT GRAM POSITIVE RODS ABUNDANT GRAM POSITIVE COCCI IN CLUSTERS IN CHAINS    Culture (A)  Final    MULTIPLE ORGANISMS PRESENT, NONE PREDOMINANT NO ANAEROBES ISOLATED Performed at [facility], [place] [address] [place], [name] [address]    Report Status 10/29/2022 FINAL  Final   Labs: CBC: Recent Labs  Lab 10/26/22 0346 10/27/22 0809 10/28/22 0353 10/29/22 0948 10/30/22 0536  WBC 15.3* 14.9* 9.1 7.8 5.8  HGB 11.7* 10.9* 10.3* 10.4* 10.4*  HCT 36.1* 35.1* 32.6* 34.0* 33.7*  MCV 86.0 89.1 87.6 90.7 88.7  PLT 217 193 214 243 144   Basic Metabolic Panel: Recent Labs  Lab 10/26/22 0346 10/27/22 0809 10/28/22 0353 10/29/22 0416 10/29/22 0948 10/30/22 0352  NA 133* 132* 134* 135  --  135  K 4.1 3.6 3.8 4.7  --  4.9  CL 104 97* 107 109   --  107  CO2 22 20* 21* 21*  --  19*  GLUCOSE 139* 127* 101* 105*  --  95  BUN '16 18 19 14  '$ --  9  CREATININE 1.35* 1.50* 1.35* 1.19  --  1.14  CALCIUM 8.1* 7.4* 7.7* 7.8*  --  7.8*  MG  --  1.4* 2.1  --  1.7 1.7  PHOS  --   --   --  2.5  --  2.9  Liver Function Tests: Recent Labs  Lab 10/25/22 1425 10/29/22 0416 10/30/22 0352  AST 16  --   --   ALT 9  --   --   ALKPHOS 143*  --   --   BILITOT 0.9  --   --   PROT 7.1  --   --   ALBUMIN 3.5 2.2* 2.0*   CBG: Recent Labs  Lab 10/26/22 0741  GLUCAP 126*    Discharge time spent: greater than 30 minutes.  Signed: Berle Mull, MD [facility] 10/30/2022

## 2022-11-03 ENCOUNTER — Telehealth: Payer: Self-pay | Admitting: Cardiology

## 2022-11-03 NOTE — Telephone Encounter (Signed)
Called pt's sister she states she faxed their portion of the paperwork, she spoke with the company today and they said they do not have the provider portion. Will get message to [facility] for review.

## 2022-11-03 NOTE — Telephone Encounter (Signed)
Follow Up:    Sister is calling to check on the status of patient assistance application for patient's Eliquis. She said she turned in his part to the office on 10-20-22.

## 2022-11-04 NOTE — Telephone Encounter (Signed)
Left message for linda that application had been faxed on 10/22/22 but have re-faxed it again.

## 2022-11-08 ENCOUNTER — Emergency Department (HOSPITAL_COMMUNITY): Payer: Medicare HMO

## 2022-11-08 ENCOUNTER — Other Ambulatory Visit: Payer: Self-pay

## 2022-11-08 ENCOUNTER — Inpatient Hospital Stay (HOSPITAL_COMMUNITY)
Admission: EM | Admit: 2022-11-08 | Discharge: 2022-11-12 | DRG: 392 | Disposition: A | Discharge status: Home Health | Payer: Medicare HMO | Attending: Internal Medicine | Admitting: Internal Medicine

## 2022-11-08 ENCOUNTER — Encounter (HOSPITAL_COMMUNITY): Payer: Self-pay

## 2022-11-08 DIAGNOSIS — E871 Hypo-osmolality and hyponatremia: Secondary | ICD-10-CM | POA: Diagnosis not present

## 2022-11-08 DIAGNOSIS — M542 Cervicalgia: Secondary | ICD-10-CM | POA: Diagnosis not present

## 2022-11-08 DIAGNOSIS — M109 Gout, unspecified: Secondary | ICD-10-CM | POA: Insufficient documentation

## 2022-11-08 DIAGNOSIS — Z7901 Long term (current) use of anticoagulants: Secondary | ICD-10-CM

## 2022-11-08 DIAGNOSIS — Z79899 Other long term (current) drug therapy: Secondary | ICD-10-CM | POA: Diagnosis not present

## 2022-11-08 DIAGNOSIS — K5720 Diverticulitis of large intestine with perforation and abscess without bleeding: Secondary | ICD-10-CM | POA: Diagnosis not present

## 2022-11-08 DIAGNOSIS — E7800 Pure hypercholesterolemia, unspecified: Secondary | ICD-10-CM | POA: Diagnosis present

## 2022-11-08 DIAGNOSIS — K5730 Diverticulosis of large intestine without perforation or abscess without bleeding: Secondary | ICD-10-CM | POA: Diagnosis not present

## 2022-11-08 DIAGNOSIS — I10 Essential (primary) hypertension: Secondary | ICD-10-CM | POA: Diagnosis present

## 2022-11-08 DIAGNOSIS — K2970 Gastritis, unspecified, without bleeding: Secondary | ICD-10-CM | POA: Diagnosis not present

## 2022-11-08 DIAGNOSIS — I4891 Unspecified atrial fibrillation: Secondary | ICD-10-CM | POA: Diagnosis not present

## 2022-11-08 DIAGNOSIS — R748 Abnormal levels of other serum enzymes: Secondary | ICD-10-CM

## 2022-11-08 DIAGNOSIS — N1831 Chronic kidney disease, stage 3a: Secondary | ICD-10-CM | POA: Diagnosis not present

## 2022-11-08 DIAGNOSIS — R110 Nausea: Secondary | ICD-10-CM | POA: Diagnosis not present

## 2022-11-08 DIAGNOSIS — E1122 Type 2 diabetes mellitus with diabetic chronic kidney disease: Secondary | ICD-10-CM | POA: Diagnosis not present

## 2022-11-08 DIAGNOSIS — I5032 Chronic diastolic (congestive) heart failure: Secondary | ICD-10-CM | POA: Diagnosis present

## 2022-11-08 DIAGNOSIS — I482 Chronic atrial fibrillation, unspecified: Secondary | ICD-10-CM | POA: Diagnosis not present

## 2022-11-08 DIAGNOSIS — K639 Disease of intestine, unspecified: Secondary | ICD-10-CM | POA: Diagnosis present

## 2022-11-08 DIAGNOSIS — I480 Paroxysmal atrial fibrillation: Secondary | ICD-10-CM | POA: Diagnosis not present

## 2022-11-08 DIAGNOSIS — Z87891 Personal history of nicotine dependence: Secondary | ICD-10-CM | POA: Diagnosis not present

## 2022-11-08 DIAGNOSIS — N281 Cyst of kidney, acquired: Secondary | ICD-10-CM | POA: Diagnosis not present

## 2022-11-08 DIAGNOSIS — F419 Anxiety disorder, unspecified: Secondary | ICD-10-CM | POA: Insufficient documentation

## 2022-11-08 DIAGNOSIS — K5792 Diverticulitis of intestine, part unspecified, without perforation or abscess without bleeding: Secondary | ICD-10-CM | POA: Diagnosis not present

## 2022-11-08 DIAGNOSIS — B351 Tinea unguium: Secondary | ICD-10-CM

## 2022-11-08 DIAGNOSIS — Z8546 Personal history of malignant neoplasm of prostate: Secondary | ICD-10-CM

## 2022-11-08 DIAGNOSIS — K5732 Diverticulitis of large intestine without perforation or abscess without bleeding: Principal | ICD-10-CM

## 2022-11-08 DIAGNOSIS — I130 Hypertensive heart and chronic kidney disease with heart failure and stage 1 through stage 4 chronic kidney disease, or unspecified chronic kidney disease: Secondary | ICD-10-CM | POA: Diagnosis not present

## 2022-11-08 DIAGNOSIS — K219 Gastro-esophageal reflux disease without esophagitis: Secondary | ICD-10-CM | POA: Insufficient documentation

## 2022-11-08 DIAGNOSIS — Z743 Need for continuous supervision: Secondary | ICD-10-CM | POA: Diagnosis not present

## 2022-11-08 DIAGNOSIS — Z8249 Family history of ischemic heart disease and other diseases of the circulatory system: Secondary | ICD-10-CM

## 2022-11-08 DIAGNOSIS — K6389 Other specified diseases of intestine: Secondary | ICD-10-CM | POA: Insufficient documentation

## 2022-11-08 DIAGNOSIS — R531 Weakness: Secondary | ICD-10-CM | POA: Diagnosis not present

## 2022-11-08 DIAGNOSIS — E785 Hyperlipidemia, unspecified: Secondary | ICD-10-CM | POA: Diagnosis present

## 2022-11-08 DIAGNOSIS — R0902 Hypoxemia: Secondary | ICD-10-CM | POA: Diagnosis not present

## 2022-11-08 DIAGNOSIS — Z85828 Personal history of other malignant neoplasm of skin: Secondary | ICD-10-CM | POA: Diagnosis not present

## 2022-11-08 DIAGNOSIS — K651 Peritoneal abscess: Secondary | ICD-10-CM | POA: Diagnosis not present

## 2022-11-08 DIAGNOSIS — R069 Unspecified abnormalities of breathing: Secondary | ICD-10-CM | POA: Diagnosis not present

## 2022-11-08 LAB — COMPREHENSIVE METABOLIC PANEL
ALT: 19 U/L (ref 0–44)
AST: 32 U/L (ref 15–41)
Albumin: 2.9 g/dL — ABNORMAL LOW (ref 3.5–5.0)
Alkaline Phosphatase: 150 U/L — ABNORMAL HIGH (ref 38–126)
Anion gap: 10 (ref 5–15)
BUN: 14 mg/dL (ref 8–23)
CO2: 24 mmol/L (ref 22–32)
Calcium: 8.7 mg/dL — ABNORMAL LOW (ref 8.9–10.3)
Chloride: 99 mmol/L (ref 98–111)
Creatinine, Ser: 1.32 mg/dL — ABNORMAL HIGH (ref 0.61–1.24)
GFR, Estimated: 54 mL/min — ABNORMAL LOW (ref >60)
Glucose, Bld: 133 mg/dL — ABNORMAL HIGH (ref 70–99)
Potassium: 4.2 mmol/L (ref 3.5–5.1)
Sodium: 133 mmol/L — ABNORMAL LOW (ref 135–145)
Total Bilirubin: 1.3 mg/dL — ABNORMAL HIGH (ref 0.3–1.2)
Total Protein: 8.2 g/dL — ABNORMAL HIGH (ref 6.5–8.1)

## 2022-11-08 LAB — CBC WITH DIFFERENTIAL/PLATELET
Abs Immature Granulocytes: 0.12 10*3/uL — ABNORMAL HIGH (ref 0.00–0.07)
Basophils Absolute: 0.1 10*3/uL (ref 0.0–0.1)
Basophils Relative: 0 %
Eosinophils Absolute: 0.1 10*3/uL (ref 0.0–0.5)
Eosinophils Relative: 0 %
HCT: 40.5 % (ref 39.0–52.0)
Hemoglobin: 13.2 g/dL (ref 13.0–17.0)
Immature Granulocytes: 1 %
Lymphocytes Relative: 8 %
Lymphs Abs: 1.6 10*3/uL (ref 0.7–4.0)
MCH: 27.7 pg (ref 26.0–34.0)
MCHC: 32.6 g/dL (ref 30.0–36.0)
MCV: 84.9 fL (ref 80.0–100.0)
Monocytes Absolute: 1.7 10*3/uL — ABNORMAL HIGH (ref 0.1–1.0)
Monocytes Relative: 8 %
Neutro Abs: 17.4 10*3/uL — ABNORMAL HIGH (ref 1.7–7.7)
Neutrophils Relative %: 83 %
Platelets: 411 10*3/uL — ABNORMAL HIGH (ref 150–400)
RBC: 4.77 MIL/uL (ref 4.22–5.81)
RDW: 14.9 % (ref 11.5–15.5)
WBC: 21 10*3/uL — ABNORMAL HIGH (ref 4.0–10.5)
nRBC: 0 % (ref 0.0–0.2)

## 2022-11-08 LAB — URINALYSIS, ROUTINE W REFLEX MICROSCOPIC
Bilirubin Urine: NEGATIVE
Glucose, UA: NEGATIVE mg/dL
Hgb urine dipstick: NEGATIVE
Ketones, ur: 5 mg/dL — AB
Leukocytes,Ua: NEGATIVE
Nitrite: NEGATIVE
Protein, ur: NEGATIVE mg/dL
Specific Gravity, Urine: 1.024 (ref 1.005–1.030)
pH: 5 (ref 5.0–8.0)

## 2022-11-08 MED ORDER — FLUCONAZOLE IN SODIUM CHLORIDE 200-0.9 MG/100ML-% IV SOLN
200.0000 mg | INTRAVENOUS | Status: DC
  Administered 2022-11-08 – 2022-11-12 (×5): 200 mg via INTRAVENOUS
  Filled 2022-11-08 (×5): qty 100

## 2022-11-08 MED ORDER — METRONIDAZOLE 500 MG/100ML IV SOLN: 500.0000 mg | Freq: Two times a day (BID) | INTRAVENOUS | Status: DC

## 2022-11-08 MED ORDER — MORPHINE SULFATE (PF) 2 MG/ML IV SOLN: 2.0000 mg | INTRAVENOUS | Status: DC | PRN

## 2022-11-08 MED ORDER — ACETAMINOPHEN 650 MG RE SUPP: 650.0000 mg | Freq: Four times a day (QID) | RECTAL | Status: DC | PRN

## 2022-11-08 MED ORDER — SODIUM CHLORIDE 0.9 % IV BOLUS
1000.0000 mL | Freq: Once | INTRAVENOUS | Status: AC
  Administered 2022-11-08: 1000 mL via INTRAVENOUS

## 2022-11-08 MED ORDER — ACETAMINOPHEN 325 MG PO TABS
650.0000 mg | ORAL_TABLET | Freq: Four times a day (QID) | ORAL | Status: DC | PRN
  Administered 2022-11-08: 650 mg via ORAL
  Filled 2022-11-08: qty 2

## 2022-11-08 MED ORDER — SODIUM CHLORIDE 0.9 % IV SOLN
2.0000 g | Freq: Once | INTRAVENOUS | Status: AC
  Administered 2022-11-08: 2 g via INTRAVENOUS
  Filled 2022-11-08: qty 12.5

## 2022-11-08 MED ORDER — HYDROCODONE-ACETAMINOPHEN 5-325 MG PO TABS
1.0000 | ORAL_TABLET | ORAL | Status: DC | PRN
  Administered 2022-11-08: 1 via ORAL
  Administered 2022-11-09 – 2022-11-11 (×7): 2 via ORAL
  Administered 2022-11-12: 1 via ORAL
  Filled 2022-11-08 (×6): qty 2
  Filled 2022-11-08: qty 1
  Filled 2022-11-08: qty 2
  Filled 2022-11-08: qty 1

## 2022-11-08 MED ORDER — SODIUM CHLORIDE 0.9 % IV SOLN: INTRAVENOUS | Status: DC

## 2022-11-08 MED ORDER — PROCHLORPERAZINE EDISYLATE 10 MG/2ML IJ SOLN: 10.0000 mg | Freq: Four times a day (QID) | INTRAMUSCULAR | Status: DC | PRN

## 2022-11-08 MED ORDER — METRONIDAZOLE 500 MG/100ML IV SOLN
500.0000 mg | Freq: Once | INTRAVENOUS | Status: AC
  Administered 2022-11-08: 500 mg via INTRAVENOUS
  Filled 2022-11-08: qty 100

## 2022-11-08 MED ORDER — IOHEXOL 300 MG/ML  SOLN
100.0000 mL | Freq: Once | INTRAMUSCULAR | Status: AC | PRN
  Administered 2022-11-08: 100 mL via INTRAVENOUS

## 2022-11-08 MED ORDER — PIPERACILLIN-TAZOBACTAM 3.375 G IVPB
3.3750 g | Freq: Three times a day (TID) | INTRAVENOUS | Status: DC
  Administered 2022-11-08 – 2022-11-12 (×12): 3.375 g via INTRAVENOUS
  Filled 2022-11-08 (×12): qty 50

## 2022-11-08 MED ORDER — ACETAMINOPHEN 325 MG PO TABS
650.0000 mg | ORAL_TABLET | Freq: Once | ORAL | Status: AC
  Administered 2022-11-08: 650 mg via ORAL
  Filled 2022-11-08: qty 2

## 2022-11-08 NOTE — ED Provider Notes (Signed)
[facility] Provider Note   CSN: [id number] Arrival date & time: 11/08/22  0103     History  Chief Complaint  Patient presents with   Weakness    Ian Estes is a 82 y.o. male.  The history is provided by the patient and a relative.  Weakness He has history of hypertension, diabetes, hyperlipidemia, atrial fibrillation anticoagulated on apixaban gout, prostate cancer who was recently discharged following hospitalization for diverticulitis of the colon with abscess treated with percutaneous drainage.  He had been doing reasonably well until about 3 days ago when he states that he stopped eating.  He states that there was some nausea but he has just not been hungry.  He denies fever, chills, sweats.  He denies any abdominal pain.  Denies constipation or diarrhea.  Denies any urinary difficulty.  He is not hurting anywhere.  However, he has been having progressive weakness to the point where he is having difficulty standing now.   Home Medications Prior to Admission medications   Medication Sig Start Date End Date Taking? Authorizing Provider  acetaminophen (TYLENOL) 325 MG tablet Take 2 tablets (650 mg total) by mouth every 6 (six) hours as needed for mild pain, fever or headache. 10/30/22   Lavina Hamman, MD  allopurinol (ZYLOPRIM) 100 MG tablet Take 100 mg by mouth daily.    [provider]  amoxicillin-clavulanate (AUGMENTIN) 875-125 MG tablet Take 1 tablet by mouth 2 (two) times daily for 10 days. 10/30/22 11/09/22  Lavina Hamman, MD  apixaban (ELIQUIS) 5 MG TABS tablet Take 1 tablet (5 mg total) by mouth 2 (two) times daily. Patient not taking: Reported on 10/26/2022 04/17/22   Minus Breeding, MD  atorvastatin (LIPITOR) 20 MG tablet Take 20 mg by mouth daily.    [provider]  clonazePAM (KLONOPIN) 0.5 MG tablet Take 0.5 mg by mouth daily.    [provider]  fluconazole (DIFLUCAN) 200 MG tablet Take 1  tablet (200 mg total) by mouth daily for 12 days. 10/30/22 11/11/22  Lavina Hamman, MD  folic acid (FOLVITE) 1 MG tablet Take 1 tablet (1 mg total) by mouth daily. 12/25/18   Antony Odea, PA-C  furosemide (LASIX) 20 MG tablet Take 1 tablet (20 mg total) by mouth daily as needed (for weight gain of >3 lbs in 1 day or >5 lbs in 1 week or for edema in legs). 10/30/22 10/30/23  Lavina Hamman, MD  pantoprazole (PROTONIX) 40 MG tablet Take 40 mg by mouth daily. 09/24/22   [provider]  saccharomyces boulardii (FLORASTOR) 250 MG capsule Take 1 capsule (250 mg total) by mouth 2 (two) times daily. 10/29/22   Lavina Hamman, MD  traMADol (ULTRAM) 50 MG tablet Take 1 tablet (50 mg total) by mouth every 8 (eight) hours as needed for moderate pain or severe pain. 10/30/22 10/30/23  Lavina Hamman, MD      Allergies    Patient has no known allergies.    Review of Systems   Review of Systems  Neurological:  Positive for weakness.  All other systems reviewed and are negative.   Physical Exam Updated Vital Signs There were no vitals taken for this visit. Physical Exam Vitals and nursing note reviewed.   82 year old male, resting comfortably and in no acute distress. Vital signs are normal. Oxygen saturation is 98%, which is normal. Head is normocephalic and atraumatic. PERRLA, EOMI. Oropharynx is clear.  Mucous  membranes appear slightly dry. Neck is nontender and supple without adenopathy or JVD. Back is nontender and there is no CVA tenderness. Lungs are clear without rales, wheezes, or rhonchi. Chest is nontender. Heart has an irregular rhythm without murmur. Abdomen is soft, flat, nontender.  Drain present left lateral abdomen, dressing not removed. Extremities have no cyanosis or edema, full range of motion is present. Skin is warm and dry without rash. Neurologic: Mental status is normal, cranial nerves are intact, moves all extremities equally.  ED Results / Procedures /  Treatments   Labs (all labs ordered are listed, but only abnormal results are displayed) Labs Reviewed  COMPREHENSIVE METABOLIC PANEL - Abnormal; Notable for the following components:      Result Value   Sodium 133 (*)    Glucose, Bld 133 (*)    Creatinine, Ser 1.32 (*)    Calcium 8.7 (*)    Total Protein 8.2 (*)    Albumin 2.9 (*)    Alkaline Phosphatase 150 (*)    Total Bilirubin 1.3 (*)    GFR, Estimated 54 (*)    All other components within normal limits  CBC WITH DIFFERENTIAL/PLATELET - Abnormal; Notable for the following components:   WBC 21.0 (*)    Platelets 411 (*)    Neutro Abs 17.4 (*)    Monocytes Absolute 1.7 (*)    Abs Immature Granulocytes 0.12 (*)    All other components within normal limits  URINALYSIS, ROUTINE W REFLEX MICROSCOPIC - Abnormal; Notable for the following components:   APPearance HAZY (*)    Ketones, ur 5 (*)    All other components within normal limits    EKG EKG Interpretation  Date/Time:  Sunday November 08 2022 01:24:18 EST Ventricular Rate:  71 PR Interval:    QRS Duration: 97 QT Interval:  424 QTC Calculation: 461 R Axis:   -24 Text Interpretation: Atrial fibrillation Borderline left axis deviation Abnormal R-wave progression, early transition Repol abnrm suggests ischemia, diffuse leads When compared with ECG of 10/25/2022, No significant change was found Confirmed by Delora Fuel ([id number]) on 11/08/2022 1:46:59 AM  Radiology DG Chest Port 1 View  Result Date: 11/08/2022 CLINICAL DATA:  Generalized weakness for 3 days. Nausea with decreased appetite. EXAM: PORTABLE CHEST 1 VIEW COMPARISON:  02/22/2019 FINDINGS: The heart is enlarged and the mediastinal contour is within normal limits. There is atherosclerotic calcification of the aorta. No consolidation, effusion, or pneumothorax. No acute osseous abnormality. IMPRESSION: 1. No active disease. 2. Cardiomegaly. Electronically Signed   By: Brett Fairy M.D.   On: 11/08/2022 02:41     Procedures Procedures  Cardiac monitor shows atrial fibrillation with controlled ventricular response, per my interpretation.  Medications Ordered in ED Medications - No data to display  ED Course/ Medical Decision Making/ A&P                             Medical Decision Making Amount and/or Complexity of Data Reviewed Labs: ordered. Radiology: ordered.  Risk OTC drugs. Prescription drug management. Decision regarding hospitalization.   Anorexia and weakness.  I suspect that he is dehydrated.  Weakness can be multifactorial but I am concerned about the following possibilities: Dehydration, electrolyte disturbance, occult infection such as UTI or pneumonia.  I have ordered screening lab tests of CBC, comprehensive metabolic panel, urinalysis and I have ordered a chest x-ray.  I have reviewed and interpreted his electrocardiogram, and my interpretation is atrial fibrillation with  controlled ventricular response, nonspecific ST and T changes, unchanged from recent ECG.  I have reviewed his past records, and he was admitted to the hospital on 10/25/2022 for diverticulitis with localized perforation and abscess treated with antibiotics and percutaneous drainage by interventional radiology, discharged on 10/30/2022.  Chest x-ray shows cardiomegaly but no evidence of pneumonia.  I have independently viewed the image, and agree with radiologist's interpretation.  I have reviewed and interpreted his laboratory tests, and my interpretation is mild hyponatremia which is not felt to be clinically significant, renal insufficiency slightly worse than on 10/30/2022 but comparable to what was seen on 10/28/2022.  Significant leukocytosis is noted with WBC 21.0 and left shift, new compared with 10/30/2022.  Mild thrombocytosis is present which is also new, probably reactive.  Urinalysis is significant for mild ketonuria but no evidence of UTI.  With new leukocytosis and abdominal drain still in place, I am  concerned about possible reaccumulation of his diverticular abscess.  I have ordered a CT of abdomen and pelvis.  CT shows a new area diverticulitis which is separate and distinct from the prior episode of diverticulitis with abscess.  Also, annular proximal colonic masses noted concerning for cancer.  Because diverticulitis has developed in spite of patient being on antibiotics, I feel he needs to be in the hospital for IV antibiotics.  He will need GI consultation to evaluate his colonic mass.  I have discussed the case with Dr. Marylyn Ishihara of [facility], who agrees to admit the patient.  Final Clinical Impression(s) / ED Diagnoses Final diagnoses:  Diverticulitis of large intestine without perforation or abscess without bleeding  Mass of colon  Hyponatremia  Elevated alkaline phosphatase level    Rx / DC Orders ED Discharge Orders     None         Delora Fuel, MD 84/16/60 [id number]

## 2022-11-08 NOTE — ED Notes (Signed)
Pt was able to ambulate from bed to bedside commode and back to bed with no staff assistance.

## 2022-11-08 NOTE — Progress Notes (Signed)
Pharmacy Antibiotic Note  Ian Estes is a 82 y.o. male admitted on 11/08/2022 with diverticulitis that failed OP abx.  Pharmacy has been consulted for zosyn dosing. He has been on augmentin & fluconazole since 10/30/22.  CT shows a new area diverticulitis which is separate and distinct from the prior episode of diverticulitis with abscess   Plan: Zosyn 3.375g IV q8h (4 hour infusion). Fluconazole '200mg'$  IV q24 per MD F/u renal function & plans    Temp (24hrs), Avg:97.7 F (36.5 C), Min:97.6 F (36.4 C), Max:97.8 F (36.6 C)  Recent Labs  Lab 11/08/22 0218  WBC 21.0*  CREATININE 1.32*    Estimated Creatinine Clearance: 51.8 mL/min (A) (by C-G formula based on SCr of 1.32 mg/dL (H)).    No Known Allergies  Antimicrobials this admission: 1/21 cefepime x 1 1/21 flagyl x 1 1/21 zosyn>> Dose adjustments this admission:  Microbiology results:  PTA: 10/27/22 Abd abscess: rew yeast w/ pseudohyphae. Abun GPR, abun GPC in clusters in chains. mult org. None predominant.  1/7 BC2 ngF  Thank you for allowing pharmacy to be a part of this patient's care.  Eudelia Bunch, Pharm.D Use secure chat for questions 11/08/2022 8:56 AM

## 2022-11-08 NOTE — H&P (Addendum)
History and Physical    Patient: Ian Estes JQB:[id number] DOB: 18-May-1941 DOA: 11/08/2022 DOS: the patient was seen and examined on 11/08/2022 PCP: Aletha Halim., PA-C  Patient coming from: Home  Chief Complaint:  Chief Complaint  Patient presents with   Weakness   HPI: Ian Estes is a 82 y.o. male with medical history significant of a fib, CKD 3a, HTN, HLD. Presenting with decreased appetite and weakness. He was recently admitted to the hospital for acute sigmoid diverticulitis w/ abscess. He had a drain placement by IR. He was stablized, transitioned from IV to PO abx, and discharged to home in good condition on 1/12. He says that he was doing well at home until about 4 days ago. He began to fell nauseous. He had decreased appetite. He felt progressively more weak each day. He was compliant with his discharge regimen. He didn't have any fevers or vomiting. He reports intermittent diarrhea. When his symptoms did not improve last night, he decided to come to the ED for evaluation.     Review of Systems: As mentioned in the history of present illness. All other systems reviewed and are negative. 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"   Diverticulitis    Gout    High cholesterol    Hypertension    Obesity    Pneumonia 2014 X 1   Prostate cancer ([place])    Type II diabetes mellitus ([place])    Past Surgical History:  Procedure Laterality Date   INCISION AND DRAINAGE PERIRECTAL ABSCESS  1990's   "hemorrhoid"   KNEE ARTHROSCOPY Right ~ 2011   PROSTATE SURGERY  1996   SKIN CANCER EXCISION  ~ 2005 X 2   "left foot"   SUBXYPHOID PERICARDIAL WINDOW N/A 12/21/2018   Procedure: SUBXYPHOID PERICARDIAL WINDOW;  Surgeon: Ivin Poot, MD;  Location: [facility];  Service: Thoracic;  Laterality: N/A;   TEE WITHOUT CARDIOVERSION N/A 12/21/2018   Procedure: TRANSESOPHAGEAL  ECHOCARDIOGRAM (TEE);  Surgeon: Prescott Gum, Collier Salina, MD;  Location: [facility] OR;  Service: Thoracic;  Laterality: N/A;   Social History:  reports that he quit smoking about 35 years ago. His smoking use included cigarettes. He has a 48.00 pack-year smoking history. He has never used smokeless tobacco. He reports current alcohol use of about 21.0 standard drinks of alcohol per week. He reports that he does not use drugs.  No Known Allergies  Family History  Problem Relation Age of Onset   CAD Neg Hx     Prior to Admission medications   Medication Sig Start Date End Date Taking? Authorizing Provider  acetaminophen (TYLENOL) 325 MG tablet Take 2 tablets (650 mg total) by mouth every 6 (six) hours as needed for mild pain, fever or headache. 10/30/22   Lavina Hamman, MD  allopurinol (ZYLOPRIM) 100 MG tablet Take 100 mg by mouth daily.    [provider]  amoxicillin-clavulanate (AUGMENTIN) 875-125 MG tablet Take 1 tablet by mouth 2 (two) times daily for 10 days. 10/30/22 11/09/22  Lavina Hamman, MD  apixaban (ELIQUIS) 5 MG TABS tablet Take 1 tablet (5 mg total) by mouth 2 (two) times daily. Patient not taking: Reported on 10/26/2022 04/17/22   Minus Breeding, MD  atorvastatin (LIPITOR) 20 MG tablet Take 20 mg by mouth daily.    [provider]  clonazePAM (KLONOPIN) 0.5 MG tablet  Take 0.5 mg by mouth daily.    [provider]  fluconazole (DIFLUCAN) 200 MG tablet Take 1 tablet (200 mg total) by mouth daily for 12 days. 10/30/22 11/11/22  Lavina Hamman, MD  folic acid (FOLVITE) 1 MG tablet Take 1 tablet (1 mg total) by mouth daily. 12/25/18   Antony Odea, PA-C  furosemide (LASIX) 20 MG tablet Take 1 tablet (20 mg total) by mouth daily as needed (for weight gain of >3 lbs in 1 day or >5 lbs in 1 week or for edema in legs). 10/30/22 10/30/23  Lavina Hamman, MD  pantoprazole (PROTONIX) 40 MG tablet Take 40 mg by mouth daily. 09/24/22   [provider]  saccharomyces  boulardii (FLORASTOR) 250 MG capsule Take 1 capsule (250 mg total) by mouth 2 (two) times daily. 10/29/22   Lavina Hamman, MD  traMADol (ULTRAM) 50 MG tablet Take 1 tablet (50 mg total) by mouth every 8 (eight) hours as needed for moderate pain or severe pain. 10/30/22 10/30/23  Lavina Hamman, MD    Physical Exam: Vitals:   11/08/22 0500 11/08/22 0530 11/08/22 0555 11/08/22 0700  BP: 133/63 126/73  128/69  Pulse: 64 74  (!) 59  Resp: '16 19  17  '$ Temp:   97.8 F (36.6 C)   TempSrc:   Oral   SpO2: 97% 97%  98%   General: 82 y.o. male resting in bed in NAD Eyes: PERRL, normal sclera ENMT: Nares patent w/o discharge, orophaynx clear, dentition normal, ears w/o discharge/lesions/ulcers Neck: Supple, trachea midline Cardiovascular: RRR, +S1, S2, no m/g/r, equal pulses throughout Respiratory: CTABL, no w/r/r, normal WOB GI: BS+, NDNT, no masses noted, no organomegaly noted MSK: No e/c/c Neuro: A&O x 3, no focal deficits Psyc: Appropriate interaction and affect, calm/cooperative  Data Reviewed:  Results for orders placed or performed during the hospital encounter of 11/08/22 (from the past 24 hour(s))  Comprehensive metabolic panel     Status: Abnormal   Collection Time: 11/08/22  2:18 AM  Result Value Ref Range   Sodium 133 (L) 135 - 145 mmol/L   Potassium 4.2 3.5 - 5.1 mmol/L   Chloride 99 98 - 111 mmol/L   CO2 24 22 - 32 mmol/L   Glucose, Bld 133 (H) 70 - 99 mg/dL   BUN 14 8 - 23 mg/dL   Creatinine, Ser 1.32 (H) 0.61 - 1.24 mg/dL   Calcium 8.7 (L) 8.9 - 10.3 mg/dL   Total Protein 8.2 (H) 6.5 - 8.1 g/dL   Albumin 2.9 (L) 3.5 - 5.0 g/dL   AST 32 15 - 41 U/L   ALT 19 0 - 44 U/L   Alkaline Phosphatase 150 (H) 38 - 126 U/L   Total Bilirubin 1.3 (H) 0.3 - 1.2 mg/dL   GFR, Estimated 54 (L) >60 mL/min   Anion gap 10 5 - 15  CBC with Differential     Status: Abnormal   Collection Time: 11/08/22  2:18 AM  Result Value Ref Range   WBC 21.0 (H) 4.0 - 10.5 K/uL   RBC 4.77 4.22 -  5.81 MIL/uL   Hemoglobin 13.2 13.0 - 17.0 g/dL   HCT 40.5 39.0 - 52.0 %   MCV 84.9 80.0 - 100.0 fL   MCH 27.7 26.0 - 34.0 pg   MCHC 32.6 30.0 - 36.0 g/dL   RDW 14.9 11.5 - 15.5 %   Platelets 411 (H) 150 - 400 K/uL   nRBC 0.0 0.0 - 0.2 %  Neutrophils Relative % 83 %   Neutro Abs 17.4 (H) 1.7 - 7.7 K/uL   Lymphocytes Relative 8 %   Lymphs Abs 1.6 0.7 - 4.0 K/uL   Monocytes Relative 8 %   Monocytes Absolute 1.7 (H) 0.1 - 1.0 K/uL   Eosinophils Relative 0 %   Eosinophils Absolute 0.1 0.0 - 0.5 K/uL   Basophils Relative 0 %   Basophils Absolute 0.1 0.0 - 0.1 K/uL   Immature Granulocytes 1 %   Abs Immature Granulocytes 0.12 (H) 0.00 - 0.07 K/uL  Urinalysis, Routine w reflex microscopic     Status: Abnormal   Collection Time: 11/08/22  4:39 AM  Result Value Ref Range   Color, Urine YELLOW YELLOW   APPearance HAZY (A) CLEAR   Specific Gravity, Urine 1.024 1.005 - 1.030   pH 5.0 5.0 - 8.0   Glucose, UA NEGATIVE NEGATIVE mg/dL   Hgb urine dipstick NEGATIVE NEGATIVE   Bilirubin Urine NEGATIVE NEGATIVE   Ketones, ur 5 (A) NEGATIVE mg/dL   Protein, ur NEGATIVE NEGATIVE mg/dL   Nitrite NEGATIVE NEGATIVE   Leukocytes,Ua NEGATIVE NEGATIVE   CT ab/pelvis w/ contrast 1. 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 2. Active sigmoid diverticulitis with mild inflammation, new from 10/25/2022. 3. Percutaneous drainage of small bowel diverticular collection which now measures 2.5 cm. The catheter has cannulated the small bowel lumen through the diverticulum neck. 4. 5 cm central abdominal collection, likely a small bowel diverticulum. No visible superimposed inflammation.  Assessment and Plan: Acute diverticulitis     - admit to inpt, med-surg     - imaging shows new area of diverticulitis; also shows decreased in abscess size since placement of drain; he reports compliance with his discharge abx and antifungal regimen     - add zosyn; resume diflucan IV      - fluids, anti-emetics, pain control  Colon mass     - not discretely noted on previous CT     - he follows w/ Dr. Collene Mares; have sent secure chat to Dr. Benson Norway about possible outpt c-scope to assess     - just had a contrasted study today; given his baseline renal function, let's get him a contrasted chest CT tomorrow to assess for any possible mets  PAF     - continue home regimen when confirmed  HLD     - continue home regimen when confirmed  HTN     - continue home regimen when confirmed  CKD 3a     - at baseline; monitor  Anxiety     - continue home regimen when confirmed  GERD     - PPI  Gout     - continue home regimen when confirmed  Advance Care Planning:   Code Status: FULL  Consults: [facility] (Dr. Benson Norway through [facility])  Family Communication: w/ sister at bedside  Severity of Illness: The appropriate patient status for this patient is INPATIENT. Inpatient status is judged to be reasonable and necessary in order to provide the required intensity of service to ensure the patient's safety. The patient's presenting symptoms, physical exam findings, and initial radiographic and laboratory data in the context of their chronic comorbidities is felt to place them at high risk for further clinical deterioration. Furthermore, it is not anticipated that the patient will be medically stable for discharge from the hospital within 2 midnights of admission.   * I certify that at the point of admission it is my clinical judgment  that the patient will require inpatient hospital care spanning beyond 2 midnights from the point of admission due to high intensity of service, high risk for further deterioration and high frequency of surveillance required.*  Author: Jonnie Finner, DO 11/08/2022 7:52 AM  For on call review [email or web address].

## 2022-11-08 NOTE — ED Triage Notes (Signed)
Pt. [facility] for generalized weakness x3 days. Pt. Experiences nausea and decreased appetite as well. VSS with EMS.

## 2022-11-09 DIAGNOSIS — K5792 Diverticulitis of intestine, part unspecified, without perforation or abscess without bleeding: Secondary | ICD-10-CM | POA: Diagnosis not present

## 2022-11-09 LAB — COMPREHENSIVE METABOLIC PANEL
ALT: 14 U/L (ref 0–44)
AST: 19 U/L (ref 15–41)
Albumin: 2.4 g/dL — ABNORMAL LOW (ref 3.5–5.0)
Alkaline Phosphatase: 112 U/L (ref 38–126)
Anion gap: 7 (ref 5–15)
BUN: 16 mg/dL (ref 8–23)
CO2: 23 mmol/L (ref 22–32)
Calcium: 8.2 mg/dL — ABNORMAL LOW (ref 8.9–10.3)
Chloride: 105 mmol/L (ref 98–111)
Creatinine, Ser: 1.08 mg/dL (ref 0.61–1.24)
GFR, Estimated: >60 mL/min (ref >60)
Glucose, Bld: 118 mg/dL — ABNORMAL HIGH (ref 70–99)
Potassium: 3.6 mmol/L (ref 3.5–5.1)
Sodium: 135 mmol/L (ref 135–145)
Total Bilirubin: 0.5 mg/dL (ref 0.3–1.2)
Total Protein: 5.9 g/dL — ABNORMAL LOW (ref 6.5–8.1)

## 2022-11-09 LAB — CBC
HCT: 34.8 % — ABNORMAL LOW (ref 39.0–52.0)
Hemoglobin: 10.6 g/dL — ABNORMAL LOW (ref 13.0–17.0)
MCH: 26.2 pg (ref 26.0–34.0)
MCHC: 30.5 g/dL (ref 30.0–36.0)
MCV: 86.1 fL (ref 80.0–100.0)
Platelets: 326 10*3/uL (ref 150–400)
RBC: 4.04 MIL/uL — ABNORMAL LOW (ref 4.22–5.81)
RDW: 15.1 % (ref 11.5–15.5)
WBC: 12.8 10*3/uL — ABNORMAL HIGH (ref 4.0–10.5)
nRBC: 0 % (ref 0.0–0.2)

## 2022-11-09 MED ORDER — ENSURE ENLIVE PO LIQD
237.0000 mL | Freq: Two times a day (BID) | ORAL | Status: DC
  Administered 2022-11-09: 237 mL via ORAL

## 2022-11-09 MED ORDER — CLONAZEPAM 0.5 MG PO TABS: 0.5000 mg | ORAL_TABLET | Freq: Every day | ORAL | Status: DC | PRN

## 2022-11-09 MED ORDER — SACCHAROMYCES BOULARDII 250 MG PO CAPS: 250.0000 mg | ORAL_CAPSULE | Freq: Two times a day (BID) | ORAL | Status: DC

## 2022-11-09 MED ORDER — FOLIC ACID 1 MG PO TABS: 1.0000 mg | ORAL_TABLET | Freq: Every day | ORAL | Status: DC

## 2022-11-09 MED ORDER — PANTOPRAZOLE SODIUM 40 MG PO TBEC
40.0000 mg | DELAYED_RELEASE_TABLET | Freq: Every day | ORAL | Status: DC
  Administered 2022-11-09 – 2022-11-12 (×4): 40 mg via ORAL
  Filled 2022-11-09 (×4): qty 1

## 2022-11-09 MED ORDER — BOOST / RESOURCE BREEZE PO LIQD CUSTOM
1.0000 | Freq: Three times a day (TID) | ORAL | Status: DC
  Administered 2022-11-09 – 2022-11-10 (×2): 1 via ORAL

## 2022-11-09 MED ORDER — TRAMADOL HCL 50 MG PO TABS: 50.0000 mg | ORAL_TABLET | Freq: Four times a day (QID) | ORAL | Status: DC | PRN

## 2022-11-09 NOTE — Progress Notes (Signed)
Mobility Specialist Cancellation/Refusal Note:  Pt declined mobility 2x today, stating he will be ready to ambulate tomorrow. Will check back as schedule permits.   [facility]

## 2022-11-09 NOTE — Progress Notes (Signed)
[facility] Progress Note     Subjective: Patient known to our service from recent admission about 1.5 weeks ago with diverticulitis and abscess that got a perc chole drain.  He was discharged home on 1/12 with oral abx therapy.  He presents back to [facility] yesterday due to weakness and some abdominal pain.  He denies N/V, but just with poor appetite.  He has lost about 30 lbs in the month or so.  His last colonoscopy was 10 years ago with no return follow up secondary to age.  Dr. Collene Mares is his GI doc.  His new CT scan suggests persistent sigmoid diverticulitis with a proximal annular colonic mass concerning for malignancy and his perc drain appears to have cannulated through the lumen of the small bowel.  We have been asked to see.  Objective: Vital signs in last 24 hours: Temp:  [97.4 F (36.3 C)-97.8 F (36.6 C)] 97.7 F (36.5 C) (01/22 0523) Pulse Rate:  [52-81] 52 (01/22 0523) Resp:  [14-18] 18 (01/22 0523) BP: (96-121)/(56-71) 114/60 (01/22 0523) SpO2:  [98 %-100 %] 98 % (01/22 0523) Weight:  [89.5 kg] 89.5 kg (01/21 1200) Last BM Date : 11/08/22  Intake/Output from previous day: 01/21 0701 - 01/22 0700 In: 2409.9 [P.O.:360; I.V.:744; IV Piggyback:1305.9] Out: 0  Intake/Output this shift: No intake/output data recorded.  PE: Gen:  Alert, NAD, pleasant Pulm: rate and effort normal  Abd: soft, ND, mild TTP around drain, drain with nothing in the bag but stained enteric color.  Drain flushes well and pulls back succus.  Lab Results:  Recent Labs    11/08/22 0218 11/09/22 0505  WBC 21.0* 12.8*  HGB 13.2 10.6*  HCT 40.5 34.8*  PLT 411* 326   BMET Recent Labs    11/08/22 0218 11/09/22 0505  NA 133* 135  K 4.2 3.6  CL 99 105  CO2 24 23  GLUCOSE 133* 118*  BUN 14 16  CREATININE 1.32* 1.08  CALCIUM 8.7* 8.2*   PT/INR No results for input(s): "LABPROT", "INR" in the last 72 hours. CMP     Component Value Date/Time   NA 135 11/09/2022 0505   NA 138  01/12/2019 0852   K 3.6 11/09/2022 0505   CL 105 11/09/2022 0505   CO2 23 11/09/2022 0505   GLUCOSE 118 (H) 11/09/2022 0505   BUN 16 11/09/2022 0505   BUN 20 01/12/2019 0852   CREATININE 1.08 11/09/2022 0505   CALCIUM 8.2 (L) 11/09/2022 0505   PROT 5.9 (L) 11/09/2022 0505   ALBUMIN 2.4 (L) 11/09/2022 0505   AST 19 11/09/2022 0505   ALT 14 11/09/2022 0505   ALKPHOS 112 11/09/2022 0505   BILITOT 0.5 11/09/2022 0505   GFRNONAA >60 11/09/2022 0505   GFRAA 63 01/12/2019 0852   Lipase     Component Value Date/Time   LIPASE <10 (L) 10/25/2022 1425       Studies/Results: CT ABDOMEN PELVIS W CONTRAST  Result Date: 11/08/2022 CLINICAL DATA:  Generalized weakness for 3 days. Intra-abdominal abscess. EXAM: CT ABDOMEN AND PELVIS WITH CONTRAST TECHNIQUE: Multidetector CT imaging of the abdomen and pelvis was performed using the standard protocol following bolus administration of intravenous contrast. RADIATION DOSE REDUCTION: This exam was performed according to the departmental dose-optimization program which includes automated exposure control, adjustment of the mA and/or kV according to patient size and/or use of iterative reconstruction technique. CONTRAST:  167m OMNIPAQUE IOHEXOL 300 MG/ML  SOLN COMPARISON:  10/25/2022 FINDINGS: Lower chest: Extensive coronary atherosclerosis. Large  heart size. Subpleural reticulation with scar-like appearance in the lower lobes. No acute finding mild linear Hepatobiliary: Mild liver surface lobulation without convincing cirrhosis no evidence of biliary obstruction or stone. Pancreas: Generalized atrophy Spleen: Unremarkable. Adrenals/Urinary Tract: Negative adrenals. No hydronephrosis or stone. Bilateral renal atrophy. 18 mm interpolar left renal cyst. No follow-up recommended. Unremarkable bladder. Stomach/Bowel: Percutaneous drainage catheter has decompressed the previously seen collection and the tip now cannulates in adjacent small bowel loop. Residual  collection measures 2.5 cm. Suspect this is a collapsed diverticulum. Additional large diverticulum suspected from a central small bowel loops, containing fluid and debris and measuring up to 5 cm. There are colonic diverticula swell, including a the sigmoid diverticulum which shows active inflammation, see markings on coronal reformats. Annular masslike appearance at the ascending colon just above the ileocecal valve. No convincing adenopathy no bowel obstruction Vascular/Lymphatic: Extensive atheromatous calcification of the aorta and branch vessels. No mass or adenopathy. Reproductive:No pathologic findings. Other: No ascites or pneumoperitoneum. Musculoskeletal: No acute abnormalities. Direct message sent in epic chat. IMPRESSION: 1. 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 2. Active sigmoid diverticulitis with mild inflammation, new from 10/25/2022. 3. Percutaneous drainage of small bowel diverticular collection which now measures 2.5 cm. The catheter has cannulated the small bowel lumen through the diverticulum neck. 4. 5 cm central abdominal collection, likely a small bowel diverticulum. No visible superimposed inflammation. Electronically Signed   By: Jorje Guild M.D.   On: 11/08/2022 06:10   DG Chest Port 1 View  Result Date: 11/08/2022 CLINICAL DATA:  Generalized weakness for 3 days. Nausea with decreased appetite. EXAM: PORTABLE CHEST 1 VIEW COMPARISON:  02/22/2019 FINDINGS: The heart is enlarged and the mediastinal contour is within normal limits. There is atherosclerotic calcification of the aorta. No consolidation, effusion, or pneumothorax. No acute osseous abnormality. IMPRESSION: 1. No active disease. 2. Cardiomegaly. Electronically Signed   By: Brett Fairy M.D.   On: 11/08/2022 02:41    Anti-infectives: Anti-infectives (From admission, onward)    Start     Dose/Rate Route Frequency Ordered Stop   11/08/22 2000  metroNIDAZOLE (FLAGYL) IVPB 500 mg   Status:  Discontinued        500 mg 100 mL/hr over 60 Minutes Intravenous Every 12 hours 11/08/22 0820 11/08/22 0840   11/08/22 1400  piperacillin-tazobactam (ZOSYN) IVPB 3.375 g        3.375 g 12.5 mL/hr over 240 Minutes Intravenous Every 8 hours 11/08/22 0845     11/08/22 1000  fluconazole (DIFLUCAN) IVPB 200 mg        200 mg 100 mL/hr over 60 Minutes Intravenous Every 24 hours 11/08/22 0814     11/08/22 0700  ceFEPIme (MAXIPIME) 2 g in sodium chloride 0.9 % 100 mL IVPB        2 g 200 mL/hr over 30 Minutes Intravenous  Once 11/08/22 0651 11/08/22 0705   11/08/22 0700  metroNIDAZOLE (FLAGYL) IVPB 500 mg        500 mg 100 mL/hr over 60 Minutes Intravenous  Once 11/08/22 0651 11/08/22 7867        Assessment/Plan Sigmoid diverticulitis with contained perforation/ abscess, s/p perc drain now through small bowel lumen and 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 Gram stain with GPR, GPC, and few yeast; culture with multiple organisms and none predominant. Drain concerning for migration through SB lumen.  Will let IR know so they can eval. - cont abx therapy for diverticulitis.  Certainly  all the new CT scan findings complicate this picture given persistent diverticulitis, but with drain in SB, and a possible proximal colonic neoplasm.  He has been having at least 30#s of weight loss over the last month. -will need to discuss this scan with Dr. Harlow Asa prior to further recommendations.    ID - zosyn>>, diflucan>> VTE - SCDs, hold eliquis >> ok for heparin gtt from surgical standpoint if needed FEN - IVF per TR[facility], FLD Foley - none   A fib on eliquis  Hx pericardial effusion s/p pericardial window 12/22/2018 HTN HLD CKD-IIIa  I reviewed hospitalist notes, last 24 h vitals and pain scores, last 48 h intake and output, and last 24 h labs and trends.    LOS: 1 day    Henreitta Cea, [facility] 11/09/2022, 9:09 AM Please see Amion  for pager number during day hours 7:00am-4:30pm

## 2022-11-09 NOTE — Hospital Course (Signed)
82 year old male PMH including chronic A-fib on apixaban, recurrent diverticulitis, presented with generalized weakness found to have leukocytosis.  Underwent a CT abdomen which showed new area of diverticulitis, possible colonic mass in the ascending colon and drain migration in small bowel. IR was consulted, drain was removed on 1/22. General surgery consulted.  Plan for conservative measures with IV antibiotics for now.

## 2022-11-09 NOTE — TOC CM/SW Note (Signed)
Transition of Care [facility]) Screening Note  Patient Details  Name: Ethel Veronica Date of Birth: [birth year]  Transition of Care [facility]) CM/SW Contact:    Sherie Don, LCSW Phone Number: 11/09/2022, 10:48 AM  Transition of Care Department [facility]) has reviewed patient and no TOC needs have been identified at this time. We will continue to monitor patient advancement through interdisciplinary progression rounds. If new patient transition needs arise, please place a TOC consult.

## 2022-11-09 NOTE — Progress Notes (Signed)
Referring Physician(s): Gerkin  Supervising Physician: Michaelle Birks  Patient Status:  [facility] - In-pt  Chief Complaint:  F/u Drain  Brief History:  Ian Estes is a 82 y.o. male with medical issues including chronic A-fib on Eliquis, CKD stage IIIa, HTN, HLD and history of diverticulitis.    He presented to ED 10/25/2022 complaining of abdominal pain.  CT showed sigmoid diverticulosis with superimposed bowel wall thickening and inflammatory changes with perforation/abscess.   He underwent placement of a drain by Dr. Earleen Newport on 10/27/22.  He was discharged on 10/30/22.  He presented again to the ED on 11/08/22 with decreased appetite.   CT showed= 1. 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 2. Active sigmoid diverticulitis with mild inflammation, new from 10/25/2022. 3. Percutaneous drainage of small bowel diverticular collection which now measures 2.5 cm. The catheter has cannulated the small bowel lumen through the diverticulum neck. 4. 5 cm central abdominal collection, likely a small bowel diverticulum. No visible superimposed inflammation.  We are asked to evaluate the drain.  Subjective:  Doing ok. Asking about his colon mass. I explained I was only there to evaluate his drain.  Allergies: Patient has no known allergies.  Medications: Prior to Admission medications   Medication Sig Start Date End Date Taking? Authorizing Provider  acetaminophen (TYLENOL) 325 MG tablet Take 2 tablets (650 mg total) by mouth every 6 (six) hours as needed for mild pain, fever or headache. 10/30/22  Yes Lavina Hamman, MD  amoxicillin-clavulanate (AUGMENTIN) 875-125 MG tablet Take 1 tablet by mouth 2 (two) times daily for 10 days. 10/30/22 11/09/22 Yes Lavina Hamman, MD  apixaban (ELIQUIS) 5 MG TABS tablet Take 1 tablet (5 mg total) by mouth 2 (two) times daily. 04/17/22  Yes Minus Breeding, MD  clonazePAM (KLONOPIN) 0.5 MG tablet Take 0.5 mg by mouth  daily as needed for anxiety (or rest).   Yes [provider]  fluconazole (DIFLUCAN) 200 MG tablet Take 1 tablet (200 mg total) by mouth daily for 12 days. 10/30/22 11/11/22 Yes Lavina Hamman, MD  folic acid (FOLVITE) 1 MG tablet Take 1 tablet (1 mg total) by mouth daily. 12/25/18  Yes Roddenberry, Arlis Porta, PA-C  furosemide (LASIX) 20 MG tablet Take 1 tablet (20 mg total) by mouth daily as needed (for weight gain of >3 lbs in 1 day or >5 lbs in 1 week or for edema in legs). 10/30/22 10/30/23 Yes Lavina Hamman, MD  ondansetron (ZOFRAN) 4 MG tablet Take 4 mg by mouth every 8 (eight) hours as needed for nausea or vomiting. 11/06/22  Yes [provider]  saccharomyces boulardii (FLORASTOR) 250 MG capsule Take 1 capsule (250 mg total) by mouth 2 (two) times daily. 10/29/22  Yes Lavina Hamman, MD  traMADol (ULTRAM) 50 MG tablet Take 1 tablet (50 mg total) by mouth every 8 (eight) hours as needed for moderate pain or severe pain. 10/30/22 10/30/23 Yes Lavina Hamman, MD  allopurinol (ZYLOPRIM) 100 MG tablet Take 100 mg by mouth daily. Patient not taking: Reported on 11/08/2022    [provider]  amLODipine (NORVASC) 5 MG tablet Take 5 mg by mouth daily. Patient not taking: Reported on 11/08/2022 11/05/22   [provider]  atorvastatin (LIPITOR) 20 MG tablet Take 20 mg by mouth daily. Patient not taking: Reported on 11/08/2022    [provider]  metoprolol succinate (TOPROL-XL) 25 MG 24 hr tablet Take 25 mg by mouth daily. Patient not  taking: Reported on 11/08/2022 11/05/22   [provider]  pantoprazole (PROTONIX) 40 MG tablet Take 40 mg by mouth daily. Patient not taking: Reported on 11/08/2022 09/24/22   [provider]     Vital Signs: BP (!) 114/56 (BP Location: Right Arm)   Pulse 61   Temp (!) 97.5 F (36.4 C) (Oral)   Resp 16   Ht 6' (1.829 m)   Wt 197 lb 5 oz (89.5 kg)   SpO2 99%   BMI 26.76 kg/m   Physical Exam Constitutional:       Appearance: Normal appearance.  HENT:     Head: Normocephalic and atraumatic.  Cardiovascular:     Rate and Rhythm: Normal rate.  Pulmonary:     Effort: Pulmonary effort is normal. No respiratory distress.  Abdominal:     Palpations: Abdomen is soft.  Skin:    General: Skin is warm and dry.  Neurological:     General: No focal deficit present.     Mental Status: He is alert and oriented to person, place, and time.  Psychiatric:        Mood and Affect: Mood normal.        Behavior: Behavior normal.        Thought Content: Thought content normal.        Judgment: Judgment normal.      Labs:  CBC: Recent Labs    10/29/22 0948 10/30/22 0536 11/08/22 0218 11/09/22 0505  WBC 7.8 5.8 21.0* 12.8*  HGB 10.4* 10.4* 13.2 10.6*  HCT 34.0* 33.7* 40.5 34.8*  PLT 243 248 411* 326    COAGS: No results for input(s): "INR", "APTT" in the last 8760 hours.  BMP: Recent Labs    10/29/22 0416 10/30/22 0352 11/08/22 0218 11/09/22 0505  NA 135 135 133* 135  K 4.7 4.9 4.2 3.6  CL 109 107 99 105  CO2 21* 19* 24 23  GLUCOSE 105* 95 133* 118*  BUN '14 9 14 16  '$ CALCIUM 7.8* 7.8* 8.7* 8.2*  CREATININE 1.19 1.14 1.32* 1.08  GFRNONAA >60 >60 54* >60    LIVER FUNCTION TESTS: Recent Labs    10/25/22 1425 10/29/22 0416 10/30/22 0352 11/08/22 0218 11/09/22 0505  BILITOT 0.9  --   --  1.3* 0.5  AST 16  --   --  32 19  ALT 9  --   --  19 14  ALKPHOS 143*  --   --  150* 112  PROT 7.1  --   --  8.2* 5.9*  ALBUMIN 3.5 2.2* 2.0* 2.9* 2.4*    Assessment and Plan:  S/P Percutaneous drainage of small bowel diverticular collection which now measures 2.5 cm.   The catheter has cannulated the small bowel lumen through the diverticulum neck.  Images reviewed by Dr. Maryelizabeth Kaufmann. He recommends removal and then repeat CT scan in 3-5 days to re-evaluate.  Discussed with General Surgery team Rubie Maid and Dr. Harlow Asa) and they agree with plan.  Drain removed easily and clean dry  dressing placed.  Electronically Signed: Murrell Redden, PA-C 11/09/2022, 1:21 PM    I spent a total of 25 Minutes at the the patient's bedside AND on the patient's hospital floor or unit, greater than 50% of which was counseling/coordinating care for f/u drain with subsequent removal.

## 2022-11-09 NOTE — Progress Notes (Signed)
[facility] Progress Note Patient: Ian Estes QQV:[id number] DOB: 12/03/1940 DOA: 11/08/2022  DOS: the patient was seen and examined on 11/09/2022  Brief hospital course: 82 year old male PMH including chronic A-fib on apixaban, recurrent diverticulitis, presented with generalized weakness found to have leukocytosis.  Underwent a CT abdomen which showed new area of diverticulitis, possible colonic mass in the ascending colon and drain migration in small bowel. IR was consulted, drain was removed on 1/22. General surgery consulted.  Plan for conservative measures with IV antibiotics for now. Assessment and Plan: Recurrent diverticulitis.  With perforation and abscess. 1/7 CT abdomen shows evidence of acute sigmoid diverticulitis with perforation. Drain was placed patient was treated with antibiotics and was discharged home. 1/21 sigmoid diverticulitis with mild inflammation with reduction in the size of the abscess. General surgery was reconsulted. Recommended IV antibiotics. IR was consulted as the CT also shows evidence of catheter cannulating the small bowel lumen.  Catheter was removed on 1/22. Currently on IV Zosyn and IV Flagyl. Will continue the same for now. Management per surgery.  Ascending colon mass. CT on 1/21 also shows evidence of annular proximal colonic mass. Monitor for now.  Chronic A-fib. Currently rate controlled. On Eliquis currently on hold.  Chronic diastolic CHF. Was on Lasix.  Currently on hold.  HTN. Was on Norvasc and metoprolol.  Currently on hold.  Anxiety. On Klonopin. Continue.  Mild renal dysfunction. Baseline serum creatinine around 1.1 on admission serum creatinine 1.3.  Now improving to 1.1 again. Monitor.   Subjective: No abdominal pain.  Has some nausea no vomiting but no fever no chills.  Continues to have fatigue and tiredness.  Physical Exam: General: in Mild distress, No Rash Cardiovascular: S1 and S2 Present, No  Murmur Respiratory: Good respiratory effort, Bilateral Air entry present. No Crackles, No wheezes Abdomen: Bowel Sound present, mild diffuse tenderness Extremities: No edema Neuro: Alert and oriented x3, no new focal deficit  Data Reviewed: I have Reviewed nursing notes, Vitals, and Lab results. Since last encounter, pertinent lab results CBC and BMP   . I have ordered test including CBC and BMP  . I have discussed pt's care plan and test results with general surgery  .   Disposition: Status is: Inpatient Remains inpatient appropriate because: Need for IV antibiotics   Family Communication: Son at bedside. Level of care: Med-Surg  Vitals:   11/09/22 0112 11/09/22 0523 11/09/22 0948 11/09/22 1358  BP: (!) 121/58 114/60 (!) 114/56 123/63  Pulse: 62 (!) 52 61 62  Resp: '16 18 16 16  '$ Temp: (!) 97.4 F (36.3 C) 97.7 F (36.5 C) (!) 97.5 F (36.4 C) (!) 97.4 F (36.3 C)  TempSrc: Oral Oral Oral   SpO2: 100% 98% 99% 95%  Weight:      Height:         Author: Berle Mull, MD 11/09/2022 4:28 PM  Please look on [email or web address] to find out who is on call.

## 2022-11-10 DIAGNOSIS — K5792 Diverticulitis of intestine, part unspecified, without perforation or abscess without bleeding: Secondary | ICD-10-CM | POA: Diagnosis not present

## 2022-11-10 LAB — BASIC METABOLIC PANEL
Anion gap: 11 (ref 5–15)
BUN: 16 mg/dL (ref 8–23)
CO2: 19 mmol/L — ABNORMAL LOW (ref 22–32)
Calcium: 8.4 mg/dL — ABNORMAL LOW (ref 8.9–10.3)
Chloride: 103 mmol/L (ref 98–111)
Creatinine, Ser: 1.04 mg/dL (ref 0.61–1.24)
GFR, Estimated: >60 mL/min (ref >60)
Glucose, Bld: 142 mg/dL — ABNORMAL HIGH (ref 70–99)
Potassium: 3.7 mmol/L (ref 3.5–5.1)
Sodium: 133 mmol/L — ABNORMAL LOW (ref 135–145)

## 2022-11-10 LAB — CBC WITH DIFFERENTIAL/PLATELET
Abs Immature Granulocytes: 0.05 10*3/uL (ref 0.00–0.07)
Basophils Absolute: 0 10*3/uL (ref 0.0–0.1)
Basophils Relative: 0 %
Eosinophils Absolute: 0.1 10*3/uL (ref 0.0–0.5)
Eosinophils Relative: 1 %
HCT: 35.8 % — ABNORMAL LOW (ref 39.0–52.0)
Hemoglobin: 11.2 g/dL — ABNORMAL LOW (ref 13.0–17.0)
Immature Granulocytes: 0 %
Lymphocytes Relative: 8 %
Lymphs Abs: 0.9 10*3/uL (ref 0.7–4.0)
MCH: 26.7 pg (ref 26.0–34.0)
MCHC: 31.3 g/dL (ref 30.0–36.0)
MCV: 85.4 fL (ref 80.0–100.0)
Monocytes Absolute: 0.9 10*3/uL (ref 0.1–1.0)
Monocytes Relative: 8 %
Neutro Abs: 9.5 10*3/uL — ABNORMAL HIGH (ref 1.7–7.7)
Neutrophils Relative %: 83 %
Platelets: 362 10*3/uL (ref 150–400)
RBC: 4.19 MIL/uL — ABNORMAL LOW (ref 4.22–5.81)
RDW: 15.2 % (ref 11.5–15.5)
WBC: 11.6 10*3/uL — ABNORMAL HIGH (ref 4.0–10.5)
nRBC: 0 % (ref 0.0–0.2)

## 2022-11-10 LAB — MAGNESIUM: Magnesium: 1.9 mg/dL (ref 1.7–2.4)

## 2022-11-10 MED ORDER — ENSURE MAX PROTEIN PO LIQD
11.0000 [oz_av] | Freq: Three times a day (TID) | ORAL | Status: DC
  Administered 2022-11-10 – 2022-11-12 (×7): 11 [oz_av] via ORAL

## 2022-11-10 NOTE — Progress Notes (Signed)
Pharmacy Antibiotic Note  Ian Estes is a 82 y.o. male admitted on 11/08/2022 with  diverticulitis .  Pharmacy has been consulted for Zosyn dosing.  ID: diverticulitis failed OP abx (Augmentin) -CT shows a NEW area of diverticulitis which is separate and distinct from the prior episode of diverticulitis with abscess.  - Afebrile. WBC 21>>12.8 down,   Antimicrobials this admission: 1/21 cefepime x 1 1/21 flagyl x 1 1/21 zosyn>> Diflucan 200 qd PTA 1/12>>   Dose adjustments this admission: Diflucan previously 50% reduced b/c of CrCl Microbiology results: none new  Last admit: 10/27/22 Abd abscess: rew yeast w/ pseudohyphae. Abun GPR, abun GPC in clusters in chains. mult org. None predominant.  1/7 BC2 ngF  Plan: Zosyn 3.375g IV q8hr ok for renal function Diflucan 200 IV q24. LOT? Eliquis not yet resumed Pharmacy will sign off. Please reconsult for further dosing assitance.   Height: 6' (182.9 cm) Weight: 89.5 kg (197 lb 5 oz) IBW/kg (Calculated) : 77.6  Temp (24hrs), Avg:97.7 F (36.5 C), Min:97.4 F (36.3 C), Max:98 F (36.7 C)  Recent Labs  Lab 11/08/22 0218 11/09/22 0505  WBC 21.0* 12.8*  CREATININE 1.32* 1.08    Estimated Creatinine Clearance: 57.9 mL/min (by C-G formula based on SCr of 1.08 mg/dL).    No Known Allergies  Eliette Drumwright S. Alford Highland, PharmD, BCPS Clinical Staff Pharmacist [email or web address]  Wayland Salinas 11/10/2022 7:29 AM

## 2022-11-10 NOTE — Progress Notes (Signed)
[facility] Progress Note     Subjective: IR drain removed yesterday.  So far no acute abdominal pain.  Tolerating some full liquids, but not eating much.  Drinking ensure.  Objective: Vital signs in last 24 hours: Temp:  [97.4 F (36.3 C)-98 F (36.7 C)] 97.5 F (36.4 C) (01/23 0545) Pulse Rate:  [62-88] 69 (01/23 0545) Resp:  [14-18] 18 (01/23 0545) BP: (108-130)/(54-67) 130/67 (01/23 0545) SpO2:  [95 %-100 %] 98 % (01/23 0545) Last BM Date : 11/09/22  Intake/Output from previous day: 01/22 0701 - 01/23 0700 In: 2331.3 [P.O.:1200; I.V.:874.8; IV Piggyback:256.5] Out: 0  Intake/Output this shift: Total I/O In: 240 [P.O.:240] Out: -   PE: Gen:  Alert, NAD, pleasant Pulm: rate and effort normal  Abd: soft, ND, NT, +BS  Lab Results:  Recent Labs    11/09/22 0505 11/10/22 0858  WBC 12.8* 11.6*  HGB 10.6* 11.2*  HCT 34.8* 35.8*  PLT 326 362   BMET Recent Labs    11/09/22 0505 11/10/22 0858  NA 135 133*  K 3.6 3.7  CL 105 103  CO2 23 19*  GLUCOSE 118* 142*  BUN 16 16  CREATININE 1.08 1.04  CALCIUM 8.2* 8.4*   PT/INR No results for input(s): "LABPROT", "INR" in the last 72 hours. CMP     Component Value Date/Time   NA 133 (L) 11/10/2022 0858   NA 138 01/12/2019 0852   K 3.7 11/10/2022 0858   CL 103 11/10/2022 0858   CO2 19 (L) 11/10/2022 0858   GLUCOSE 142 (H) 11/10/2022 0858   BUN 16 11/10/2022 0858   BUN 20 01/12/2019 0852   CREATININE 1.04 11/10/2022 0858   CALCIUM 8.4 (L) 11/10/2022 0858   PROT 5.9 (L) 11/09/2022 0505   ALBUMIN 2.4 (L) 11/09/2022 0505   AST 19 11/09/2022 0505   ALT 14 11/09/2022 0505   ALKPHOS 112 11/09/2022 0505   BILITOT 0.5 11/09/2022 0505   GFRNONAA >60 11/10/2022 0858   GFRAA 63 01/12/2019 0852   Lipase     Component Value Date/Time   LIPASE <10 (L) 10/25/2022 1425       Studies/Results: No results found.  Anti-infectives: Anti-infectives (From admission, onward)    Start     Dose/Rate  Route Frequency Ordered Stop   11/08/22 2000  metroNIDAZOLE (FLAGYL) IVPB 500 mg  Status:  Discontinued        500 mg 100 mL/hr over 60 Minutes Intravenous Every 12 hours 11/08/22 0820 11/08/22 0840   11/08/22 1400  piperacillin-tazobactam (ZOSYN) IVPB 3.375 g        3.375 g 12.5 mL/hr over 240 Minutes Intravenous Every 8 hours 11/08/22 0845     11/08/22 1000  fluconazole (DIFLUCAN) IVPB 200 mg        200 mg 100 mL/hr over 60 Minutes Intravenous Every 24 hours 11/08/22 0814     11/08/22 0700  ceFEPIme (MAXIPIME) 2 g in sodium chloride 0.9 % 100 mL IVPB        2 g 200 mL/hr over 30 Minutes Intravenous  Once 11/08/22 0651 11/08/22 0705   11/08/22 0700  metroNIDAZOLE (FLAGYL) IVPB 500 mg        500 mg 100 mL/hr over 60 Minutes Intravenous  Once 11/08/22 0651 11/08/22 1497        Assessment/Plan Sigmoid diverticulitis with contained perforation/ abscess, s/p perc drain now through small bowel lumen and 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 Gram stain with GPR,  GPC, and few yeast; culture with multiple organisms and none predominant. Drain concerning for migration through SB lumen.  Drain removed 1/22. -so far no evidence of peritonitis from leaking SB.  Continue on full liquids today and may consider advancement to solid diet tomorrow if still doing well. - if we can get him better after drain removal without needing surgical intervention, then continue to work on cooling off diverticulitis and plan for c-scope in about 4-6 weeks and then discuss surgical intervention plans at that time. -follow up currently with Dr. Redmond Pulling in our office. -cont abx therapy -cont to closely monitor   ID - zosyn>>, diflucan>> VTE - SCDs, hold eliquis >> ok for heparin gtt from surgical standpoint if needed FEN - IVF per [facility], FLD, Ensure TID Foley - none   A fib on eliquis  Hx pericardial effusion s/p pericardial window 12/22/2018 HTN HLD CKD-IIIa  I reviewed  hospitalist notes, last 24 h vitals and pain scores, last 48 h intake and output, and last 24 h labs and trends.    LOS: 2 days    Henreitta Cea, [facility] 11/10/2022, 10:07 AM Please see Amion for pager number during day hours 7:00am-4:30pm

## 2022-11-10 NOTE — Progress Notes (Signed)
Mobility Specialist Cancellation/Refusal Note:   Pt declined mobility 2x today due to feeling weak. Will check back as schedule permits.    [facility]

## 2022-11-10 NOTE — Progress Notes (Signed)
[facility] Progress Note Patient: Ian Estes NLZ:[id number] DOB: 1940-12-08 DOA: 11/08/2022  DOS: the patient was seen and examined on 11/10/2022  Brief hospital course: 82 year old male PMH including chronic A-fib on apixaban, recurrent diverticulitis, presented with generalized weakness found to have leukocytosis.  Underwent a CT abdomen which showed new area of diverticulitis, possible colonic mass in the ascending colon and drain migration in small bowel. IR was consulted, drain was removed on 1/22. General surgery consulted.  Plan for conservative measures with IV antibiotics for now. Assessment and Plan: Recurrent diverticulitis.  With perforation and abscess. 1/7 CT abdomen shows evidence of acute sigmoid diverticulitis with perforation. Drain was placed patient was treated with antibiotics and was discharged home. 1/21 sigmoid diverticulitis with mild inflammation with reduction in the size of the abscess. General surgery was reconsulted. Recommended IV antibiotics. IR was consulted as the CT also shows evidence of catheter cannulating the small bowel lumen.  Catheter was removed on 1/22.  So far doing okay no abdominal pain or worsening leukocytosis. Currently on IV Zosyn and IV fluconazole. Will continue the same for now. Management per surgery.  Ascending colon mass. CT on 1/21 also shows evidence of annular proximal colonic mass. Monitor for now.  Chronic A-fib. Currently rate controlled. On Eliquis currently on hold.  Chronic diastolic CHF. Was on Lasix.  Currently on hold.  HTN. Was on Norvasc and metoprolol.  Currently on hold.  Anxiety. On Klonopin. Continue.  Mild renal dysfunction. Baseline serum creatinine around 1.1 on admission serum creatinine 1.3.  Now improving to 1.1 again. Monitor.   Subjective: Denies any no pain.  No nausea no vomiting no fever no chills.  Tolerating full liquid diet so far.  Reports diarrhea.  Physical Exam: General: in  Mild distress, No Rash Cardiovascular: S1 and S2 Present, No Murmur Respiratory: Good respiratory effort, Bilateral Air entry present. No Crackles, No wheezes Abdomen: Bowel Sound present, No tenderness Extremities: No edema Neuro: Alert and oriented x3, no new focal deficit  Data Reviewed: I have Reviewed nursing notes, Vitals, and Lab results. Since last encounter, pertinent lab results CBC and BMP   . I have ordered test including CBC and BMP  .    Disposition: Status is: Inpatient Remains inpatient appropriate because: Need for IV antibiotics   Family Communication: No one at bedside. Level of care: Med-Surg  Vitals:   11/09/22 1729 11/09/22 2021 11/10/22 0545 11/10/22 1222  BP: (!) 111/54 (!) 108/57 130/67 103/75  Pulse: 66 88 69 61  Resp: '14 18 18 14  '$ Temp: 98 F (36.7 C) 98 F (36.7 C) (!) 97.5 F (36.4 C)   TempSrc: Oral Oral Oral   SpO2: 100% 99% 98% 98%  Weight:      Height:         Author: Berle Mull, MD 11/10/2022 5:46 PM  Please look on [email or web address] to find out who is on call.

## 2022-11-11 ENCOUNTER — Other Ambulatory Visit: Payer: Medicare HMO

## 2022-11-11 DIAGNOSIS — K5792 Diverticulitis of intestine, part unspecified, without perforation or abscess without bleeding: Secondary | ICD-10-CM | POA: Diagnosis not present

## 2022-11-11 LAB — CBC WITH DIFFERENTIAL/PLATELET
Abs Immature Granulocytes: 0.07 10*3/uL (ref 0.00–0.07)
Basophils Absolute: 0.1 10*3/uL (ref 0.0–0.1)
Basophils Relative: 1 %
Eosinophils Absolute: 0.2 10*3/uL (ref 0.0–0.5)
Eosinophils Relative: 2 %
HCT: 32.4 % — ABNORMAL LOW (ref 39.0–52.0)
Hemoglobin: 9.9 g/dL — ABNORMAL LOW (ref 13.0–17.0)
Immature Granulocytes: 1 %
Lymphocytes Relative: 11 %
Lymphs Abs: 1.2 10*3/uL (ref 0.7–4.0)
MCH: 26.8 pg (ref 26.0–34.0)
MCHC: 30.6 g/dL (ref 30.0–36.0)
MCV: 87.8 fL (ref 80.0–100.0)
Monocytes Absolute: 1.1 10*3/uL — ABNORMAL HIGH (ref 0.1–1.0)
Monocytes Relative: 10 %
Neutro Abs: 8.1 10*3/uL — ABNORMAL HIGH (ref 1.7–7.7)
Neutrophils Relative %: 75 %
Platelets: 351 10*3/uL (ref 150–400)
RBC: 3.69 MIL/uL — ABNORMAL LOW (ref 4.22–5.81)
RDW: 15.2 % (ref 11.5–15.5)
WBC: 10.6 10*3/uL — ABNORMAL HIGH (ref 4.0–10.5)
nRBC: 0 % (ref 0.0–0.2)

## 2022-11-11 LAB — BASIC METABOLIC PANEL
Anion gap: 9 (ref 5–15)
BUN: 18 mg/dL (ref 8–23)
CO2: 23 mmol/L (ref 22–32)
Calcium: 8.3 mg/dL — ABNORMAL LOW (ref 8.9–10.3)
Chloride: 104 mmol/L (ref 98–111)
Creatinine, Ser: 1.09 mg/dL (ref 0.61–1.24)
GFR, Estimated: >60 mL/min (ref >60)
Glucose, Bld: 110 mg/dL — ABNORMAL HIGH (ref 70–99)
Potassium: 4.2 mmol/L (ref 3.5–5.1)
Sodium: 136 mmol/L (ref 135–145)

## 2022-11-11 LAB — MAGNESIUM: Magnesium: 1.9 mg/dL (ref 1.7–2.4)

## 2022-11-11 MED ORDER — DICLOFENAC SODIUM 1 % EX GEL
2.0000 g | Freq: Four times a day (QID) | CUTANEOUS | Status: DC
  Administered 2022-11-11 – 2022-11-12 (×2): 2 g via TOPICAL
  Filled 2022-11-11 (×2): qty 100

## 2022-11-11 NOTE — Progress Notes (Signed)
Mobility Specialist - Progress Note   11/11/22 0943  Mobility  Activity Ambulated with assistance in hallway  Level of Assistance Standby assist, set-up cues, supervision of patient - no hands on  Assistive Device Front wheel walker  Distance Ambulated (ft) 120 ft  Activity Response Tolerated well  Mobility Referral Yes  $Mobility charge 1 Mobility   Pt received in bed and agreeable to mobility. Pt is MinA for bed mobility. No complaints during session. Pt to bed after session with all needs met.   [facility]

## 2022-11-11 NOTE — Care Management Important Message (Signed)
Important Message  Patient Details IM Letter given. Name: Ian Estes MRN: [id number] Date of Birth: 1941/01/13   Medicare Important Message Given:  Yes     Kerin Salen 11/11/2022, 2:56 PM

## 2022-11-11 NOTE — Progress Notes (Signed)
[facility] Progress Note     Subjective: Doing great today.  No pain.  Tolerating FLD and drinking his Ensures very well.  Objective: Vital signs in last 24 hours: Temp:  [97.7 F (36.5 C)-98.2 F (36.8 C)] 97.8 F (36.6 C) (01/24 0830) Pulse Rate:  [57-66] 66 (01/24 0830) Resp:  [14-18] 18 (01/24 0830) BP: (103-122)/(55-75) 122/62 (01/24 0830) SpO2:  [98 %-100 %] 100 % (01/24 0830) Last BM Date : 11/09/22  Intake/Output from previous day: 01/23 0701 - 01/24 0700 In: 898.2 [P.O.:750; I.V.:0.8; IV Piggyback:147.4] Out: -  Intake/Output this shift: Total I/O In: 120 [P.O.:120] Out: -   PE: Gen:  Alert, NAD, pleasant Pulm: effort normal  Abd: soft, ND, NT, +BS  Lab Results:  Recent Labs    11/10/22 0858 11/11/22 0446  WBC 11.6* 10.6*  HGB 11.2* 9.9*  HCT 35.8* 32.4*  PLT 362 351   BMET Recent Labs    11/10/22 0858 11/11/22 0446  NA 133* 136  K 3.7 4.2  CL 103 104  CO2 19* 23  GLUCOSE 142* 110*  BUN 16 18  CREATININE 1.04 1.09  CALCIUM 8.4* 8.3*   PT/INR No results for input(s): "LABPROT", "INR" in the last 72 hours. CMP     Component Value Date/Time   NA 136 11/11/2022 0446   NA 138 01/12/2019 0852   K 4.2 11/11/2022 0446   CL 104 11/11/2022 0446   CO2 23 11/11/2022 0446   GLUCOSE 110 (H) 11/11/2022 0446   BUN 18 11/11/2022 0446   BUN 20 01/12/2019 0852   CREATININE 1.09 11/11/2022 0446   CALCIUM 8.3 (L) 11/11/2022 0446   PROT 5.9 (L) 11/09/2022 0505   ALBUMIN 2.4 (L) 11/09/2022 0505   AST 19 11/09/2022 0505   ALT 14 11/09/2022 0505   ALKPHOS 112 11/09/2022 0505   BILITOT 0.5 11/09/2022 0505   GFRNONAA >60 11/11/2022 0446   GFRAA 63 01/12/2019 0852   Lipase     Component Value Date/Time   LIPASE <10 (L) 10/25/2022 1425       Studies/Results: No results found.  Anti-infectives: Anti-infectives (From admission, onward)    Start     Dose/Rate Route Frequency Ordered Stop   11/08/22 2000  metroNIDAZOLE (FLAGYL)  IVPB 500 mg  Status:  Discontinued        500 mg 100 mL/hr over 60 Minutes Intravenous Every 12 hours 11/08/22 0820 11/08/22 0840   11/08/22 1400  piperacillin-tazobactam (ZOSYN) IVPB 3.375 g        3.375 g 12.5 mL/hr over 240 Minutes Intravenous Every 8 hours 11/08/22 0845     11/08/22 1000  fluconazole (DIFLUCAN) IVPB 200 mg        200 mg 100 mL/hr over 60 Minutes Intravenous Every 24 hours 11/08/22 0814     11/08/22 0700  ceFEPIme (MAXIPIME) 2 g in sodium chloride 0.9 % 100 mL IVPB        2 g 200 mL/hr over 30 Minutes Intravenous  Once 11/08/22 0651 11/08/22 0705   11/08/22 0700  metroNIDAZOLE (FLAGYL) IVPB 500 mg        500 mg 100 mL/hr over 60 Minutes Intravenous  Once 11/08/22 0651 11/08/22 9326        Assessment/Plan Sigmoid diverticulitis with contained perforation/ abscess, s/p perc drain now through small bowel lumen and 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 Gram stain with GPR, GPC, and few yeast; culture with multiple organisms and none predominant. Drain  concerning for migration through SB lumen.  Drain removed 1/22. -so far no evidence of peritonitis from leaking SB.   -adv to soft diet today, cont Ensures - if we can get him better after drain removal without needing surgical intervention, then continue to work on cooling off diverticulitis and plan for c-scope in about 4-6 weeks and then discuss surgical intervention plans at that time. -follow up currently with Dr. Redmond Pulling in our office. -cont abx therapy -d/w primary service.  Possibly home tomorrow if continues to do well   ID - zosyn>>, diflucan>> VTE - SCDs, hold eliquis >> ok for heparin gtt from surgical standpoint if needed FEN - IVF per [facility], soft, Ensure TID Foley - none   A fib on eliquis  Hx pericardial effusion s/p pericardial window 12/22/2018 HTN HLD CKD-IIIa  I reviewed hospitalist notes, last 24 h vitals and pain scores, last 48 h intake and output, and last 24  h labs and trends.    LOS: 3 days    Henreitta Cea, [facility] Surgery 11/11/2022, 10:06 AM Please see Amion for pager number during day hours 7:00am-4:30pm

## 2022-11-11 NOTE — Progress Notes (Signed)
Patient is an 82 yr old male who was admitted for diverticulitis of the large intestine on 11/08/22. Patient has some weakness but is able to ambulate to bathroom with walker . Patient was given IV antibiotics this morning at 0700. Patient is alert and oriented x 4. Pupils are equal . Patient does have some brusing on left hand due to previous IV. Lungs were clear to auscultation. No labored breathing noted. Bowel signs are present and active. Patient has a nail coming off on left toe. Patient stated it is painful to touch and stated he spoke to provider about possibly removing . Patient had +3 pitting edema on ankles.   Patient is on a full liquid diet and ate 100% of breakfast this morning 0800. Patient stated he would like to speak to provider about possibly changing diet back to solids. Patient had a bed bath at 0845. JS, Nursing Student

## 2022-11-11 NOTE — Progress Notes (Signed)
[facility] Progress Note Patient: Ian Estes ZOX:[id number] DOB: Aug 21, 1941 DOA: 11/08/2022  DOS: the patient was seen and examined on 11/11/2022  Brief hospital course: 82 year old male PMH including chronic A-fib on apixaban, recurrent diverticulitis, presented with generalized weakness found to have leukocytosis.  Underwent a CT abdomen which showed new area of diverticulitis, possible colonic mass in the ascending colon and drain migration in small bowel. IR was consulted, drain was removed on 1/22. General surgery consulted.  Plan for conservative measures with IV antibiotics for now. Assessment and Plan: Recurrent diverticulitis.  With perforation and abscess. 1/7 CT abdomen shows evidence of acute sigmoid diverticulitis with perforation. Drain was placed patient was treated with antibiotics and was discharged home. 1/21 sigmoid diverticulitis with mild inflammation with reduction in the size of the abscess. General surgery was reconsulted. Recommended IV antibiotics. IR was consulted as the CT also shows evidence of catheter cannulating the small bowel lumen.  Catheter was removed on 1/22.  So far doing okay no abdominal pain or worsening leukocytosis. Currently on IV Zosyn and IV fluconazole. Will continue the same for now. Management per surgery.  Currently plan is for outpatient colonoscopy followed by surgical resection.  Ascending colon mass. CT on 1/21 also shows evidence of annular proximal colonic mass.  Outpatient colonoscopy planned. Monitor for now.  Chronic A-fib. Currently rate controlled. On Eliquis currently on hold.  Chronic diastolic CHF. Was on Lasix.  Currently on hold.  HTN. Was on Norvasc and metoprolol.  Currently on hold.  Anxiety. On Klonopin. Continue.  Mild renal dysfunction. Baseline serum creatinine around 1.1 on admission serum creatinine 1.3.  Now improving to 1.1 again. Monitor.   Subjective: No nausea, No fever no chills.  No  chest pain.  Abdominal pain improving.  Physical Exam: General: in Mild distress, No Rash Cardiovascular: S1 and S2 Present, No Murmur Respiratory: Good respiratory effort, Bilateral Air entry present. No Crackles, No wheezes Abdomen: Bowel Sound present, No tenderness Extremities: No edema Neuro: Alert and oriented x3, no new focal deficit  Data Reviewed: I have Reviewed nursing notes, Vitals, and Lab results. Since last encounter, pertinent lab results CBC and BMP   . I have ordered test including CBC and BMP  .     Disposition: Status is: Inpatient Remains inpatient appropriate because: Need for IV antibiotics   Family Communication: No one at bedside. Level of care: Med-Surg  Vitals:   11/11/22 0830 11/11/22 1255 11/11/22 1403 11/11/22 1507  BP: 122/62 (!) 120/53  107/62  Pulse: 66 60  (!) 45  Resp: '18 20  18  '$ Temp: 97.8 F (36.6 C) 99.1 F (37.3 C) 98 F (36.7 C) 98.1 F (36.7 C)  TempSrc: Oral Oral Oral Oral  SpO2: 100% 100%  99%  Weight:      Height:         Author: Berle Mull, MD 11/11/2022 7:25 PM  Please look on [email or web address] to find out who is on call.

## 2022-11-12 DIAGNOSIS — K5792 Diverticulitis of intestine, part unspecified, without perforation or abscess without bleeding: Secondary | ICD-10-CM | POA: Diagnosis not present

## 2022-11-12 LAB — CBC WITH DIFFERENTIAL/PLATELET
Abs Immature Granulocytes: 0.05 10*3/uL (ref 0.00–0.07)
Basophils Absolute: 0.1 10*3/uL (ref 0.0–0.1)
Basophils Relative: 1 %
Eosinophils Absolute: 0.4 10*3/uL (ref 0.0–0.5)
Eosinophils Relative: 5 %
HCT: 30.5 % — ABNORMAL LOW (ref 39.0–52.0)
Hemoglobin: 9.3 g/dL — ABNORMAL LOW (ref 13.0–17.0)
Immature Granulocytes: 1 %
Lymphocytes Relative: 21 %
Lymphs Abs: 1.6 10*3/uL (ref 0.7–4.0)
MCH: 26.6 pg (ref 26.0–34.0)
MCHC: 30.5 g/dL (ref 30.0–36.0)
MCV: 87.1 fL (ref 80.0–100.0)
Monocytes Absolute: 0.9 10*3/uL (ref 0.1–1.0)
Monocytes Relative: 11 %
Neutro Abs: 5 10*3/uL (ref 1.7–7.7)
Neutrophils Relative %: 61 %
Platelets: 326 10*3/uL (ref 150–400)
RBC: 3.5 MIL/uL — ABNORMAL LOW (ref 4.22–5.81)
RDW: 15.3 % (ref 11.5–15.5)
WBC: 8 10*3/uL (ref 4.0–10.5)
nRBC: 0 % (ref 0.0–0.2)

## 2022-11-12 LAB — BASIC METABOLIC PANEL
Anion gap: 7 (ref 5–15)
BUN: 21 mg/dL (ref 8–23)
CO2: 24 mmol/L (ref 22–32)
Calcium: 8 mg/dL — ABNORMAL LOW (ref 8.9–10.3)
Chloride: 103 mmol/L (ref 98–111)
Creatinine, Ser: 1.18 mg/dL (ref 0.61–1.24)
GFR, Estimated: >60 mL/min (ref >60)
Glucose, Bld: 90 mg/dL (ref 70–99)
Potassium: 4 mmol/L (ref 3.5–5.1)
Sodium: 134 mmol/L — ABNORMAL LOW (ref 135–145)

## 2022-11-12 LAB — MAGNESIUM: Magnesium: 1.9 mg/dL (ref 1.7–2.4)

## 2022-11-12 MED ORDER — FLUCONAZOLE 200 MG PO TABS: 200.0000 mg | ORAL_TABLET | Freq: Every day | ORAL | 0 refills | Status: AC

## 2022-11-12 MED ORDER — ENSURE MAX PROTEIN PO LIQD: 11.0000 [oz_av] | Freq: Three times a day (TID) | ORAL | 0 refills | Status: DC

## 2022-11-12 MED ORDER — METOPROLOL SUCCINATE ER 25 MG PO TB24: 12.5000 mg | ORAL_TABLET | Freq: Every day | ORAL | Status: AC

## 2022-11-12 MED ORDER — AMOXICILLIN-POT CLAVULANATE 875-125 MG PO TABS: 1.0000 | ORAL_TABLET | Freq: Two times a day (BID) | ORAL | 0 refills | Status: AC

## 2022-11-12 NOTE — TOC Transition Note (Signed)
Transition of Care [facility]) - CM/SW Discharge Note  Patient Details  Name: Ian Estes MRN: [id number] Date of Birth: Sep 09, 1941  [facility] CM/SW Contact:  Sherie Don, LCSW Phone Number: 11/12/2022, 10:59 AM  Clinical Narrative: CSW notified patient is requesting HHPT after discharge. Orders have been placed. CSW spoke with patient and patient is agreeable to CSW making HHPT referral. CSW made referral to [facility] with [place], which was accepted. CSW updated patient. TOC signing off.    Final next level of care: Home w Home Health Services Barriers to Discharge: Barriers Resolved  Patient Goals and CMS Choice CMS Medicare.gov Compare Post Acute Care list provided to:: Patient Choice offered to / list presented to : Patient  Discharge Plan and Services Additional resources added to the After Visit Summary for        DME Arranged: N/A DME Agency: NA HH Arranged: PT HH Agency: [place] Date [place]: 11/12/22 Time [place]: 1042 Representative spoke with at [place]: [place] Determinants of Health ([place]) Interventions SDOH Screenings   Food Insecurity: No Food Insecurity (11/08/2022)  Housing: Low Risk  (11/08/2022)  Transportation Needs: No Transportation Needs (11/08/2022)  Utilities: Not At Risk (11/08/2022)  Tobacco Use: Medium Risk (11/08/2022)   Readmission Risk Interventions    10/27/2022    9:24 AM  Readmission Risk Prevention Plan  Post Dischage Appt Complete  Medication Screening Complete  Transportation Screening Complete

## 2022-11-12 NOTE — Progress Notes (Signed)
[facility] Progress Note     Subjective: Doing great today.  No pain.  Tolerating soft and drinking his Ensures very well.  Objective: Vital signs in last 24 hours: Temp:  [98 F (36.7 C)-99.1 F (37.3 C)] 98 F (36.7 C) (01/25 0438) Pulse Rate:  [45-60] 52 (01/25 0438) Resp:  [16-20] 16 (01/25 0438) BP: (107-124)/(53-66) 124/58 (01/25 0438) SpO2:  [98 %-100 %] 98 % (01/25 0438) Last BM Date : 11/10/22  Intake/Output from previous day: 01/24 0701 - 01/25 0700 In: 1625.5 [P.O.:780; I.V.:580.7; IV Piggyback:264.8] Out: 100 [Urine:100] Intake/Output this shift: No intake/output data recorded.  PE: Gen:  Alert, NAD, pleasant Pulm: effort normal  Abd: soft, ND, NT, +BS  Lab Results:  Recent Labs    11/11/22 0446 11/12/22 0427  WBC 10.6* 8.0  HGB 9.9* 9.3*  HCT 32.4* 30.5*  PLT 351 326   BMET Recent Labs    11/11/22 0446 11/12/22 0427  NA 136 134*  K 4.2 4.0  CL 104 103  CO2 23 24  GLUCOSE 110* 90  BUN 18 21  CREATININE 1.09 1.18  CALCIUM 8.3* 8.0*   PT/INR No results for input(s): "LABPROT", "INR" in the last 72 hours. CMP     Component Value Date/Time   NA 134 (L) 11/12/2022 0427   NA 138 01/12/2019 0852   K 4.0 11/12/2022 0427   CL 103 11/12/2022 0427   CO2 24 11/12/2022 0427   GLUCOSE 90 11/12/2022 0427   BUN 21 11/12/2022 0427   BUN 20 01/12/2019 0852   CREATININE 1.18 11/12/2022 0427   CALCIUM 8.0 (L) 11/12/2022 0427   PROT 5.9 (L) 11/09/2022 0505   ALBUMIN 2.4 (L) 11/09/2022 0505   AST 19 11/09/2022 0505   ALT 14 11/09/2022 0505   ALKPHOS 112 11/09/2022 0505   BILITOT 0.5 11/09/2022 0505   GFRNONAA >60 11/12/2022 0427   GFRAA 63 01/12/2019 0852   Lipase     Component Value Date/Time   LIPASE <10 (L) 10/25/2022 1425       Studies/Results: No results found.  Anti-infectives: Anti-infectives (From admission, onward)    Start     Dose/Rate Route Frequency Ordered Stop   11/08/22 2000  metroNIDAZOLE (FLAGYL) IVPB  500 mg  Status:  Discontinued        500 mg 100 mL/hr over 60 Minutes Intravenous Every 12 hours 11/08/22 0820 11/08/22 0840   11/08/22 1400  piperacillin-tazobactam (ZOSYN) IVPB 3.375 g        3.375 g 12.5 mL/hr over 240 Minutes Intravenous Every 8 hours 11/08/22 0845     11/08/22 1000  fluconazole (DIFLUCAN) IVPB 200 mg        200 mg 100 mL/hr over 60 Minutes Intravenous Every 24 hours 11/08/22 0814     11/08/22 0700  ceFEPIme (MAXIPIME) 2 g in sodium chloride 0.9 % 100 mL IVPB        2 g 200 mL/hr over 30 Minutes Intravenous  Once 11/08/22 0651 11/08/22 0705   11/08/22 0700  metroNIDAZOLE (FLAGYL) IVPB 500 mg        500 mg 100 mL/hr over 60 Minutes Intravenous  Once 11/08/22 0651 11/08/22 1093        Assessment/Plan Sigmoid diverticulitis with contained perforation/ abscess, s/p perc drain now through small bowel lumen and 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 Gram stain with GPR, GPC, and few yeast; culture with multiple organisms and none predominant. Drain concerning for migration through  SB lumen.  Drain removed 1/22. -so far no evidence of peritonitis from leaking SB.   -tolerating soft diet today, cont Ensures - continue to work on cooling off diverticulitis and plan for c-scope in about 4-6 weeks and then discuss surgical intervention plans at that time to address diverticular disease as well as possible right sided mass. -follow up currently with Dr. Redmond Pulling in our office. -cont abx therapy, likeyl for 7 more days orally -d/w primary service.  Stable surgically for Dc home today. -d/w patient need to follow up with Dr. Collene Mares for c-scope   ID - zosyn>>, diflucan>> can transition to augmentin from surgical standpoint VTE - SCDs, hold eliquis >> may resume FEN - IVF per TRH, soft, Ensure TID Foley - none   A fib on eliquis  Hx pericardial effusion s/p pericardial window 12/22/2018 HTN HLD CKD-IIIa  I reviewed hospitalist notes, last  24 h vitals and pain scores, last 48 h intake and output, and last 24 h labs and trends.    LOS: 4 days    Henreitta Cea, [facility] 11/12/2022, 8:32 AM Please see Amion for pager number during day hours 7:00am-4:30pm

## 2022-11-12 NOTE — Progress Notes (Signed)
Pt and family given d/c instructions and all questions answered. Pt taken via wheelchair by NT to front entrance to meet ride. 

## 2022-11-13 LAB — CEA: CEA: 4.5 ng/mL (ref 0.0–4.7)

## 2022-11-13 NOTE — Discharge Summary (Signed)
Physician Discharge Summary   Patient: Ian Estes MRN: [id number] DOB: Sep 08, 1941  Admit date:     11/08/2022  Discharge date: 11/12/2022  Discharge Physician: Berle Mull  PCP: Aletha Halim., PA-C  Recommendations at discharge: Follow up with PCP in 1 week Need colonoscopy after 4 weeks and will need surgery follow up after that depending on the findings.    Follow-up Information     Greer Pickerel, MD Follow up on 11/19/2022.   Specialty: General Surgery Why: 9am, Arrive 30 minutes prior to your appointment time, Please bring your insurance card and photo ID Contact information: [address] [place] [address] [phone]         Juanita Craver, MD. Schedule an appointment as soon as possible for a visit in 2 week(s).   Specialty: Gastroenterology Why: to follow up for colonoscopy Contact information: [address], [place] [address] [place], [place] Follow up.   Specialty: Home Health Services Why: [facility] will provide PT in the home after discharge. Contact information: [address] [place] [address] [phone]                Discharge Diagnoses: Principal Problem:   Acute diverticulitis Active Problems:   Chronic atrial fibrillation (HCC)   Chronic kidney disease, stage 3a (HCC)   Hypertension   Hyperlipidemia   GERD (gastroesophageal reflux disease)   Gout   Anxiety   Colonic mass  Hospital Course: 82 year old male PMH including chronic A-fib on apixaban, recurrent diverticulitis, presented with generalized weakness found to have leukocytosis.  Underwent a CT abdomen which showed new area of diverticulitis, possible colonic mass in the ascending colon and drain migration in small bowel. IR was consulted, drain was removed on 1/22. General surgery consulted.  Plan for conservative measures with antibiotics for now. Assessment and Plan  Recurrent  diverticulitis.  With perforation and abscess. 1/7 CT abdomen shows evidence of acute sigmoid diverticulitis with perforation. Drain was placed patient was treated with antibiotics and was discharged home. 1/21 sigmoid diverticulitis with mild inflammation with reduction in the size of the abscess. General surgery was reconsulted. Recommended IV antibiotics. IR was consulted as the CT also shows evidence of catheter cannulating the small bowel lumen.  Catheter was removed on 1/22.  So far doing okay no abdominal pain or worsening leukocytosis. Was on IV Zosyn and IV fluconazole. Management per surgery.  Currently plan is for outpatient colonoscopy followed by surgical resection.   Ascending colon mass. CT on 1/21 also shows evidence of annular proximal colonic mass.  Outpatient colonoscopy planned. Monitor for now.   Chronic A-fib. Currently rate controlled. On Eliquis   Chronic diastolic CHF. Was on Lasix.  Currently on hold.   HTN. Was on Norvasc and metoprolol.  Currently on hold.   Anxiety. On Klonopin. Continue.   Mild renal dysfunction. Baseline serum creatinine around 1.1 on admission serum creatinine 1.3.  Now improving to 1.1 again. Monitor.  Pain control - [employer] Controlled Substance Reporting System database was reviewed. and patient was instructed, not to drive, operate heavy machinery, perform activities at heights, swimming or participation in water activities or provide baby-sitting services while on Pain, Sleep and Anxiety Medications; until their outpatient Physician has advised to do so again. Also recommended to not to take more than prescribed Pain, Sleep and Anxiety Medications.  Consultants:  General surgery  IR  Procedures performed:  Drain removal.  DISCHARGE MEDICATION: Allergies as of 11/12/2022   No Known Allergies      Medication List     STOP taking these medications    amLODipine 5 MG tablet Commonly known as: NORVASC        TAKE these medications    acetaminophen 325 MG tablet Commonly known as: TYLENOL Take 2 tablets (650 mg total) by mouth every 6 (six) hours as needed for mild pain, fever or headache.   allopurinol 100 MG tablet Commonly known as: ZYLOPRIM Take 100 mg by mouth daily.   amoxicillin-clavulanate 875-125 MG tablet Commonly known as: AUGMENTIN Take 1 tablet by mouth 2 (two) times daily for 7 days.   apixaban 5 MG Tabs tablet Commonly known as: Eliquis Take 1 tablet (5 mg total) by mouth 2 (two) times daily.   atorvastatin 20 MG tablet Commonly known as: LIPITOR Take 20 mg by mouth daily.   clonazePAM 0.5 MG tablet Commonly known as: KLONOPIN Take 0.5 mg by mouth daily as needed for anxiety (or rest).   Ensure Max Protein Liqd Take 330 mLs (11 oz total) by mouth 3 (three) times daily.   fluconazole 200 MG tablet Commonly known as: DIFLUCAN Take 1 tablet (200 mg total) by mouth daily for 7 days.   folic acid 1 MG tablet Commonly known as: FOLVITE Take 1 tablet (1 mg total) by mouth daily.   furosemide 20 MG tablet Commonly known as: Lasix Take 1 tablet (20 mg total) by mouth daily as needed (for weight gain of >3 lbs in 1 day or >5 lbs in 1 week or for edema in legs).   metoprolol succinate 25 MG 24 hr tablet Commonly known as: TOPROL-XL Take 0.5 tablets (12.5 mg total) by mouth daily. What changed: how much to take   ondansetron 4 MG tablet Commonly known as: ZOFRAN Take 4 mg by mouth every 8 (eight) hours as needed for nausea or vomiting.   pantoprazole 40 MG tablet Commonly known as: PROTONIX Take 40 mg by mouth daily.   saccharomyces boulardii 250 MG capsule Commonly known as: FLORASTOR Take 1 capsule (250 mg total) by mouth 2 (two) times daily.   traMADol 50 MG tablet Commonly known as: Ultram Take 1 tablet (50 mg total) by mouth every 8 (eight) hours as needed for moderate pain or severe pain.       Disposition: Home Diet recommendation: Cardiac  diet  Discharge Exam: Vitals:   11/11/22 1403 11/11/22 1507 11/11/22 2116 11/12/22 0438  BP:  107/62 117/66 (!) 124/58  Pulse:  (!) 45 (!) 53 (!) 52  Resp:  '18 18 16  '$ Temp: 98 F (36.7 C) 98.1 F (36.7 C) 98.1 F (36.7 C) 98 F (36.7 C)  TempSrc: Oral Oral Oral Oral  SpO2:  99% 98% 98%  Weight:      Height:       General: Appear in no distress; no visible Abnormal Neck Mass Or lumps, Conjunctiva normal Cardiovascular: S1 and S2 Present, no Murmur, Respiratory: good respiratory effort, Bilateral Air entry present and CTA, no Crackles, no wheezes Abdomen: Bowel Sound present, mild tenderness Extremities: no Pedal edema Neurology: alert and oriented to time, place, and person  Filed Weights   11/08/22 1200  Weight: 89.5 kg   Condition at discharge: stable  The results of significant diagnostics from this hospitalization (including imaging, microbiology, ancillary and laboratory) are listed below for reference.   Imaging Studies: CT ABDOMEN PELVIS W CONTRAST  Result Date: 11/08/2022  CLINICAL DATA:  Generalized weakness for 3 days. Intra-abdominal abscess. EXAM: CT ABDOMEN AND PELVIS WITH CONTRAST TECHNIQUE: Multidetector CT imaging of the abdomen and pelvis was performed using the standard protocol following bolus administration of intravenous contrast. RADIATION DOSE REDUCTION: This exam was performed according to the departmental dose-optimization program which includes automated exposure control, adjustment of the mA and/or kV according to patient size and/or use of iterative reconstruction technique. CONTRAST:  113m OMNIPAQUE IOHEXOL 300 MG/ML  SOLN COMPARISON:  10/25/2022 FINDINGS: Lower chest: Extensive coronary atherosclerosis. Large heart size. Subpleural reticulation with scar-like appearance in the lower lobes. No acute finding mild linear Hepatobiliary: Mild liver surface lobulation without convincing cirrhosis no evidence of biliary obstruction or stone. Pancreas:  Generalized atrophy Spleen: Unremarkable. Adrenals/Urinary Tract: Negative adrenals. No hydronephrosis or stone. Bilateral renal atrophy. 18 mm interpolar left renal cyst. No follow-up recommended. Unremarkable bladder. Stomach/Bowel: Percutaneous drainage catheter has decompressed the previously seen collection and the tip now cannulates in adjacent small bowel loop. Residual collection measures 2.5 cm. Suspect this is a collapsed diverticulum. Additional large diverticulum suspected from a central small bowel loops, containing fluid and debris and measuring up to 5 cm. There are colonic diverticula swell, including a the sigmoid diverticulum which shows active inflammation, see markings on coronal reformats. Annular masslike appearance at the ascending colon just above the ileocecal valve. No convincing adenopathy no bowel obstruction Vascular/Lymphatic: Extensive atheromatous calcification of the aorta and branch vessels. No mass or adenopathy. Reproductive:No pathologic findings. Other: No ascites or pneumoperitoneum. Musculoskeletal: No acute abnormalities. Direct message sent in epic chat. IMPRESSION: 1. 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 2. Active sigmoid diverticulitis with mild inflammation, new from 10/25/2022. 3. Percutaneous drainage of small bowel diverticular collection which now measures 2.5 cm. The catheter has cannulated the small bowel lumen through the diverticulum neck. 4. 5 cm central abdominal collection, likely a small bowel diverticulum. No visible superimposed inflammation. Electronically Signed   By: JJorje GuildM.D.   On: 11/08/2022 06:10   DG Chest Port 1 View  Result Date: 11/08/2022 CLINICAL DATA:  Generalized weakness for 3 days. Nausea with decreased appetite. EXAM: PORTABLE CHEST 1 VIEW COMPARISON:  02/22/2019 FINDINGS: The heart is enlarged and the mediastinal contour is within normal limits. There is atherosclerotic calcification of  the aorta. No consolidation, effusion, or pneumothorax. No acute osseous abnormality. IMPRESSION: 1. No active disease. 2. Cardiomegaly. Electronically Signed   By: LBrett FairyM.D.   On: 11/08/2022 02:41   CT IMAGE GUIDE DRAIN TRANSVAG TRANSRECT PERITONEAL RETROPER  Result Date: 10/27/2022 INDICATION: 82year old male referred for drainage diverticular abscess EXAM: CT GUIDED DRAINAGE OF  ABSCESS MEDICATIONS: The patient is currently admitted to the hospital and receiving intravenous antibiotics. The antibiotics were administered within an appropriate time frame prior to the initiation of the procedure. ANESTHESIA/SEDATION: 1.0 mg IV Versed 50 mcg IV Fentanyl Moderate Sedation Time:  18 minutes The patient was continuously monitored during the procedure by the interventional radiology nurse under my direct supervision. COMPLICATIONS: None TECHNIQUE: Informed written consent was obtained from the patient after a thorough discussion of the procedural risks, benefits and alternatives. All questions were addressed. Maximal Sterile Barrier Technique was utilized including caps, mask, sterile gowns, sterile gloves, sterile drape, hand hygiene and skin antiseptic. A timeout was performed prior to the initiation of the procedure. PROCEDURE: Patient was position left anterior oblique on the CT gantry table. Scout CT acquired for planning purposes. The left lower abdominal wall was prepped with  chlorhexidine in a sterile fashion, and a sterile drape was applied covering the operative field. A sterile gown and sterile gloves were used for the procedure. Local anesthesia was provided with 1% Lidocaine. Using CT guidance, trocar needle was advanced to the abscess within the central abdomen. Modified Seldinger technique was used to place a 10 [place] drain. Proximally 30 cc of dark thin fluid with internal complexity aspirated. Drain was sutured in position and attached to gravity drainage. Final CT was acquired. Patient  tolerated the procedure well and remained hemodynamically stable throughout. No complications were encountered and no significant blood loss. FINDINGS: Abscess within the central low abdomen. Ten French drain within the central abscess. Sample sent for culture. IMPRESSION: Status post CT-guided drainage of abdominal abscess. Signed, Dulcy Fanny. Nadene Rubins, RPVI [facility] Radiology Electronically Signed   By: Corrie Mckusick D.O.   On: 10/27/2022 16:20   CT ABDOMEN PELVIS W CONTRAST  Result Date: 10/25/2022 CLINICAL DATA:  Acute abdominal pain. EXAM: CT ABDOMEN AND PELVIS WITH CONTRAST TECHNIQUE: Multidetector CT imaging of the abdomen and pelvis was performed using the standard protocol following bolus administration of intravenous contrast. RADIATION DOSE REDUCTION: This exam was performed according to the departmental dose-optimization program which includes automated exposure control, adjustment of the mA and/or kV according to patient size and/or use of iterative reconstruction technique. CONTRAST:  123m OMNIPAQUE IOHEXOL 300 MG/ML  SOLN COMPARISON:  CT abdomen pelvis dated 12/20/2018. FINDINGS: Lower chest: No acute abnormality. Hepatobiliary: No focal liver abnormality is seen. No gallstones, gallbladder wall thickening, or biliary dilatation. Pancreas: Unremarkable. No pancreatic ductal dilatation or surrounding inflammatory changes. Spleen: Normal in size without focal abnormality. Adrenals/Urinary Tract: Adrenal glands are unremarkable. Other than a 1.7 cm cyst in the left kidney, the kidneys are normal, without renal calculi, solid focal lesion, or hydronephrosis. No imaging follow-up is recommended for the left renal cyst. Bladder is unremarkable. Stomach/Bowel: Stomach is within normal limits. There is sigmoid diverticulosis with superimposed bowel wall thickening and inflammatory changes with a contained perforation/abscess in the adjacent  mesentery which measures 6.8 x 4.7 x 5.2 cm (series 5, image 69 and series 2, image 73). There is bowel wall thickening and mild associated fat stranding involving the ascending colon (series 2, images 63-71) the appendix appears normal. Evidence of bowel obstruction. Vascular/Lymphatic: Aortic atherosclerosis. An enlarged lymph node in the mesentery is likely reactive (series 5, image 75). Reproductive: The prostate is absent. Other: No abdominal wall hernia or abnormality. No abdominopelvic ascites. Musculoskeletal: Degenerative changes are seen in the spine. IMPRESSION: 1. Acute sigmoid diverticulitis complicated by a contained perforation/abscess in the adjacent mesentery which measures 6.8 x 4.7 x 5.2 cm. 2. Bowel wall thickening and associated inflammatory changes of the ascending colon likely reflect an infectious/inflammatory colitis. Aortic Atherosclerosis (ICD10-I70.0). Electronically Signed   By: TZerita BoersM.D.   On: 10/25/2022 16:45    Microbiology: Results for orders placed or performed during the hospital encounter of 10/25/22  Culture, blood (Routine X 2) w Reflex to ID Panel     Status: None   Collection Time: 10/25/22  9:08 PM   Specimen: BLOOD  Result Value Ref Range Status   Specimen Description   Final    BLOOD BLOOD RIGHT ARM forearm Performed at [facility] [place] [id number]   Special Requests   Final    BOTTLES DRAWN AEROBIC AND ANAEROBIC Blood Culture results may not be optimal due  to an inadequate volume of blood received in culture bottles Performed at [facility] [address], [place], [place] [address]    Culture   Final    NO GROWTH 5 DAYS Performed at [facility], [place] [address], [place], [place] [address]    Report Status 10/30/2022 FINAL  Final  Culture, blood (Routine X 2) w Reflex to ID Panel     Status: None   Collection Time: 10/25/22  9:08 PM   Specimen: BLOOD  Result Value  Ref Range Status   Specimen Description   Final    BLOOD BLOOD RIGHT HAND Performed at [facility] [address] [place], [place] [address]    Special Requests   Final    BOTTLES DRAWN AEROBIC AND ANAEROBIC Blood Culture adequate volume Performed at [facility] [address] [place], [place] [address]    Culture   Final    NO GROWTH 5 DAYS Performed at [facility], [place] [address] [place], [place] [address]    Report Status 10/30/2022 FINAL  Final  Aerobic/Anaerobic Culture w Gram Stain (surgical/deep wound)     Status: Abnormal   Collection Time: 10/27/22  2:34 PM   Specimen: Abdomen; Abscess  Result Value Ref Range Status   Specimen Description   Final    ABDOMEN Performed at [facility] [address] [place], [place] [address]    Special Requests   Final    NONE Performed at [facility], [place] [address] [place], [place] [address]    Gram Stain   Final    NO WBC SEEN FEW YEAST WITH PSEUDOHYPHAE ABUNDANT GRAM POSITIVE RODS ABUNDANT GRAM POSITIVE COCCI IN CLUSTERS IN CHAINS    Culture (A)  Final    MULTIPLE ORGANISMS PRESENT, NONE PREDOMINANT NO ANAEROBES ISOLATED Performed at [facility], [place] [address], [place], [place] [address]    Report Status 10/29/2022 FINAL  Final   Labs: CBC: Recent Labs  Lab 11/08/22 0218 11/09/22 0505 11/10/22 0858 11/11/22 0446 11/12/22 0427  WBC 21.0* 12.8* 11.6* 10.6* 8.0  NEUTROABS 17.4*  --  9.5* 8.1* 5.0  HGB 13.2 10.6* 11.2* 9.9* 9.3*  HCT 40.5 34.8* 35.8* 32.4* 30.5*  MCV 84.9 86.1 85.4 87.8 87.1  PLT 411* 326 362 351 643   Basic Metabolic Panel: Recent Labs  Lab 11/08/22 0218 11/09/22 0505 11/10/22 0858 11/11/22 0446 11/12/22 0427  NA 133* 135 133* 136 134*  K 4.2 3.6 3.7 4.2 4.0  CL 99 105 103 104 103  CO2 24 23 19* 23 24  GLUCOSE 133* 118* 142* 110* 90  BUN '14 16 16 18 21  '$ CREATININE 1.32* 1.08 1.04 1.09 1.18   CALCIUM 8.7* 8.2* 8.4* 8.3* 8.0*  MG  --   --  1.9 1.9 1.9   Liver Function Tests: Recent Labs  Lab 11/08/22 0218 11/09/22 0505  AST 32 19  ALT 19 14  ALKPHOS 150* 112  BILITOT 1.3* 0.5  PROT 8.2* 5.9*  ALBUMIN 2.9* 2.4*   CBG: No results for input(s): "GLUCAP" in the last 168 hours.  Discharge time spent: greater than 30 minutes.  Signed: Berle Mull, MD [facility] 11/12/2022

## 2022-11-17 DIAGNOSIS — M159 Polyosteoarthritis, unspecified: Secondary | ICD-10-CM | POA: Diagnosis not present

## 2022-11-17 DIAGNOSIS — G8929 Other chronic pain: Secondary | ICD-10-CM | POA: Diagnosis not present

## 2022-11-17 DIAGNOSIS — E7800 Pure hypercholesterolemia, unspecified: Secondary | ICD-10-CM | POA: Diagnosis not present

## 2022-11-17 DIAGNOSIS — I480 Paroxysmal atrial fibrillation: Secondary | ICD-10-CM | POA: Diagnosis not present

## 2022-11-17 DIAGNOSIS — Z8546 Personal history of malignant neoplasm of prostate: Secondary | ICD-10-CM | POA: Diagnosis not present

## 2022-11-17 DIAGNOSIS — D72829 Elevated white blood cell count, unspecified: Secondary | ICD-10-CM | POA: Diagnosis not present

## 2022-11-17 DIAGNOSIS — F419 Anxiety disorder, unspecified: Secondary | ICD-10-CM | POA: Diagnosis not present

## 2022-11-17 DIAGNOSIS — Z85828 Personal history of other malignant neoplasm of skin: Secondary | ICD-10-CM | POA: Diagnosis not present

## 2022-11-17 DIAGNOSIS — K6389 Other specified diseases of intestine: Secondary | ICD-10-CM | POA: Diagnosis not present

## 2022-11-17 DIAGNOSIS — Z8701 Personal history of pneumonia (recurrent): Secondary | ICD-10-CM | POA: Diagnosis not present

## 2022-11-17 DIAGNOSIS — K5780 Diverticulitis of intestine, part unspecified, with perforation and abscess without bleeding: Secondary | ICD-10-CM | POA: Diagnosis not present

## 2022-11-17 DIAGNOSIS — K219 Gastro-esophageal reflux disease without esophagitis: Secondary | ICD-10-CM | POA: Diagnosis not present

## 2022-11-17 DIAGNOSIS — Z792 Long term (current) use of antibiotics: Secondary | ICD-10-CM | POA: Diagnosis not present

## 2022-11-17 DIAGNOSIS — M109 Gout, unspecified: Secondary | ICD-10-CM | POA: Diagnosis not present

## 2022-11-17 DIAGNOSIS — I130 Hypertensive heart and chronic kidney disease with heart failure and stage 1 through stage 4 chronic kidney disease, or unspecified chronic kidney disease: Secondary | ICD-10-CM | POA: Diagnosis not present

## 2022-11-17 DIAGNOSIS — I5032 Chronic diastolic (congestive) heart failure: Secondary | ICD-10-CM | POA: Diagnosis not present

## 2022-11-17 DIAGNOSIS — E669 Obesity, unspecified: Secondary | ICD-10-CM | POA: Diagnosis not present

## 2022-11-17 DIAGNOSIS — Z9181 History of falling: Secondary | ICD-10-CM | POA: Diagnosis not present

## 2022-11-17 DIAGNOSIS — Z87891 Personal history of nicotine dependence: Secondary | ICD-10-CM | POA: Diagnosis not present

## 2022-11-17 DIAGNOSIS — N1831 Chronic kidney disease, stage 3a: Secondary | ICD-10-CM | POA: Diagnosis not present

## 2022-11-17 DIAGNOSIS — E1122 Type 2 diabetes mellitus with diabetic chronic kidney disease: Secondary | ICD-10-CM | POA: Diagnosis not present

## 2022-11-19 ENCOUNTER — Ambulatory Visit: Payer: Medicare HMO | Admitting: Podiatry

## 2022-11-19 DIAGNOSIS — K5720 Diverticulitis of large intestine with perforation and abscess without bleeding: Secondary | ICD-10-CM | POA: Diagnosis not present

## 2022-11-19 DIAGNOSIS — R197 Diarrhea, unspecified: Secondary | ICD-10-CM | POA: Diagnosis not present

## 2022-11-19 DIAGNOSIS — K6389 Other specified diseases of intestine: Secondary | ICD-10-CM | POA: Diagnosis not present

## 2022-11-26 ENCOUNTER — Ambulatory Visit: Payer: Medicare HMO | Admitting: Podiatry

## 2022-12-01 DIAGNOSIS — K6389 Other specified diseases of intestine: Secondary | ICD-10-CM | POA: Diagnosis not present

## 2022-12-01 DIAGNOSIS — K5780 Diverticulitis of intestine, part unspecified, with perforation and abscess without bleeding: Secondary | ICD-10-CM | POA: Diagnosis not present

## 2022-12-01 DIAGNOSIS — Z09 Encounter for follow-up examination after completed treatment for conditions other than malignant neoplasm: Secondary | ICD-10-CM | POA: Diagnosis not present

## 2022-12-01 DIAGNOSIS — K5732 Diverticulitis of large intestine without perforation or abscess without bleeding: Secondary | ICD-10-CM | POA: Diagnosis not present

## 2022-12-01 DIAGNOSIS — R110 Nausea: Secondary | ICD-10-CM | POA: Diagnosis not present

## 2022-12-02 ENCOUNTER — Emergency Department (HOSPITAL_COMMUNITY): Payer: Medicare HMO

## 2022-12-02 ENCOUNTER — Inpatient Hospital Stay (HOSPITAL_COMMUNITY)
Admission: EM | Admit: 2022-12-02 | Discharge: 2022-12-04 | DRG: 389 | Disposition: A | Discharge status: Home / Self Care | Payer: Medicare HMO | Attending: Internal Medicine | Admitting: Internal Medicine

## 2022-12-02 ENCOUNTER — Encounter (HOSPITAL_COMMUNITY): Payer: Self-pay | Admitting: Emergency Medicine

## 2022-12-02 ENCOUNTER — Other Ambulatory Visit: Payer: Self-pay

## 2022-12-02 DIAGNOSIS — N281 Cyst of kidney, acquired: Secondary | ICD-10-CM | POA: Diagnosis not present

## 2022-12-02 DIAGNOSIS — Z6826 Body mass index (BMI) 26.0-26.9, adult: Secondary | ICD-10-CM | POA: Diagnosis not present

## 2022-12-02 DIAGNOSIS — Z8546 Personal history of malignant neoplasm of prostate: Secondary | ICD-10-CM | POA: Diagnosis not present

## 2022-12-02 DIAGNOSIS — I5032 Chronic diastolic (congestive) heart failure: Secondary | ICD-10-CM | POA: Diagnosis not present

## 2022-12-02 DIAGNOSIS — K5780 Diverticulitis of intestine, part unspecified, with perforation and abscess without bleeding: Secondary | ICD-10-CM | POA: Diagnosis present

## 2022-12-02 DIAGNOSIS — K6389 Other specified diseases of intestine: Secondary | ICD-10-CM | POA: Diagnosis not present

## 2022-12-02 DIAGNOSIS — I482 Chronic atrial fibrillation, unspecified: Secondary | ICD-10-CM | POA: Diagnosis not present

## 2022-12-02 DIAGNOSIS — E7800 Pure hypercholesterolemia, unspecified: Secondary | ICD-10-CM | POA: Diagnosis not present

## 2022-12-02 DIAGNOSIS — E785 Hyperlipidemia, unspecified: Secondary | ICD-10-CM | POA: Diagnosis present

## 2022-12-02 DIAGNOSIS — F419 Anxiety disorder, unspecified: Secondary | ICD-10-CM | POA: Diagnosis not present

## 2022-12-02 DIAGNOSIS — E1122 Type 2 diabetes mellitus with diabetic chronic kidney disease: Secondary | ICD-10-CM | POA: Diagnosis present

## 2022-12-02 DIAGNOSIS — Z7901 Long term (current) use of anticoagulants: Secondary | ICD-10-CM

## 2022-12-02 DIAGNOSIS — K56609 Unspecified intestinal obstruction, unspecified as to partial versus complete obstruction: Secondary | ICD-10-CM | POA: Diagnosis not present

## 2022-12-02 DIAGNOSIS — Z8701 Personal history of pneumonia (recurrent): Secondary | ICD-10-CM | POA: Diagnosis not present

## 2022-12-02 DIAGNOSIS — M109 Gout, unspecified: Secondary | ICD-10-CM | POA: Diagnosis not present

## 2022-12-02 DIAGNOSIS — E440 Moderate protein-calorie malnutrition: Secondary | ICD-10-CM | POA: Insufficient documentation

## 2022-12-02 DIAGNOSIS — G8929 Other chronic pain: Secondary | ICD-10-CM | POA: Diagnosis not present

## 2022-12-02 DIAGNOSIS — K5669 Other partial intestinal obstruction: Secondary | ICD-10-CM | POA: Diagnosis not present

## 2022-12-02 DIAGNOSIS — I129 Hypertensive chronic kidney disease with stage 1 through stage 4 chronic kidney disease, or unspecified chronic kidney disease: Secondary | ICD-10-CM | POA: Diagnosis not present

## 2022-12-02 DIAGNOSIS — Z79899 Other long term (current) drug therapy: Secondary | ICD-10-CM

## 2022-12-02 DIAGNOSIS — M160 Bilateral primary osteoarthritis of hip: Secondary | ICD-10-CM | POA: Diagnosis not present

## 2022-12-02 DIAGNOSIS — Z87891 Personal history of nicotine dependence: Secondary | ICD-10-CM | POA: Diagnosis not present

## 2022-12-02 DIAGNOSIS — I10 Essential (primary) hypertension: Secondary | ICD-10-CM | POA: Diagnosis present

## 2022-12-02 DIAGNOSIS — M159 Polyosteoarthritis, unspecified: Secondary | ICD-10-CM | POA: Diagnosis not present

## 2022-12-02 DIAGNOSIS — N1831 Chronic kidney disease, stage 3a: Secondary | ICD-10-CM | POA: Diagnosis not present

## 2022-12-02 DIAGNOSIS — Z792 Long term (current) use of antibiotics: Secondary | ICD-10-CM | POA: Diagnosis not present

## 2022-12-02 DIAGNOSIS — K5790 Diverticulosis of intestine, part unspecified, without perforation or abscess without bleeding: Secondary | ICD-10-CM | POA: Diagnosis present

## 2022-12-02 DIAGNOSIS — K219 Gastro-esophageal reflux disease without esophagitis: Secondary | ICD-10-CM | POA: Diagnosis present

## 2022-12-02 DIAGNOSIS — Z8719 Personal history of other diseases of the digestive system: Secondary | ICD-10-CM | POA: Diagnosis not present

## 2022-12-02 DIAGNOSIS — E119 Type 2 diabetes mellitus without complications: Secondary | ICD-10-CM

## 2022-12-02 DIAGNOSIS — D649 Anemia, unspecified: Secondary | ICD-10-CM | POA: Diagnosis not present

## 2022-12-02 DIAGNOSIS — Z85828 Personal history of other malignant neoplasm of skin: Secondary | ICD-10-CM | POA: Diagnosis not present

## 2022-12-02 DIAGNOSIS — E669 Obesity, unspecified: Secondary | ICD-10-CM | POA: Diagnosis not present

## 2022-12-02 DIAGNOSIS — D631 Anemia in chronic kidney disease: Secondary | ICD-10-CM | POA: Diagnosis not present

## 2022-12-02 DIAGNOSIS — D72829 Elevated white blood cell count, unspecified: Secondary | ICD-10-CM | POA: Diagnosis not present

## 2022-12-02 DIAGNOSIS — Z9181 History of falling: Secondary | ICD-10-CM | POA: Diagnosis not present

## 2022-12-02 DIAGNOSIS — I959 Hypotension, unspecified: Secondary | ICD-10-CM | POA: Diagnosis present

## 2022-12-02 DIAGNOSIS — I480 Paroxysmal atrial fibrillation: Secondary | ICD-10-CM | POA: Diagnosis not present

## 2022-12-02 DIAGNOSIS — I130 Hypertensive heart and chronic kidney disease with heart failure and stage 1 through stage 4 chronic kidney disease, or unspecified chronic kidney disease: Secondary | ICD-10-CM | POA: Diagnosis not present

## 2022-12-02 LAB — CBC WITH DIFFERENTIAL/PLATELET
Abs Immature Granulocytes: 0.03 10*3/uL (ref 0.00–0.07)
Basophils Absolute: 0.1 10*3/uL (ref 0.0–0.1)
Basophils Relative: 1 %
Eosinophils Absolute: 0.1 10*3/uL (ref 0.0–0.5)
Eosinophils Relative: 2 %
HCT: 38.6 % — ABNORMAL LOW (ref 39.0–52.0)
Hemoglobin: 11.2 g/dL — ABNORMAL LOW (ref 13.0–17.0)
Immature Granulocytes: 0 %
Lymphocytes Relative: 20 %
Lymphs Abs: 1.6 10*3/uL (ref 0.7–4.0)
MCH: 26 pg (ref 26.0–34.0)
MCHC: 29 g/dL — ABNORMAL LOW (ref 30.0–36.0)
MCV: 89.8 fL (ref 80.0–100.0)
Monocytes Absolute: 0.9 10*3/uL (ref 0.1–1.0)
Monocytes Relative: 11 %
Neutro Abs: 5.3 10*3/uL (ref 1.7–7.7)
Neutrophils Relative %: 66 %
Platelets: 282 10*3/uL (ref 150–400)
RBC: 4.3 MIL/uL (ref 4.22–5.81)
RDW: 16.5 % — ABNORMAL HIGH (ref 11.5–15.5)
WBC: 8 10*3/uL (ref 4.0–10.5)
nRBC: 0 % (ref 0.0–0.2)

## 2022-12-02 LAB — URINALYSIS, ROUTINE W REFLEX MICROSCOPIC
Bilirubin Urine: NEGATIVE
Glucose, UA: NEGATIVE mg/dL
Hgb urine dipstick: NEGATIVE
Ketones, ur: NEGATIVE mg/dL
Leukocytes,Ua: NEGATIVE
Nitrite: NEGATIVE
Protein, ur: NEGATIVE mg/dL
Specific Gravity, Urine: 1.024 (ref 1.005–1.030)
pH: 5 (ref 5.0–8.0)

## 2022-12-02 LAB — COMPREHENSIVE METABOLIC PANEL
ALT: 15 U/L (ref 0–44)
AST: 23 U/L (ref 15–41)
Albumin: 2.9 g/dL — ABNORMAL LOW (ref 3.5–5.0)
Alkaline Phosphatase: 135 U/L — ABNORMAL HIGH (ref 38–126)
Anion gap: 10 (ref 5–15)
BUN: 12 mg/dL (ref 8–23)
CO2: 20 mmol/L — ABNORMAL LOW (ref 22–32)
Calcium: 8.5 mg/dL — ABNORMAL LOW (ref 8.9–10.3)
Chloride: 105 mmol/L (ref 98–111)
Creatinine, Ser: 1 mg/dL (ref 0.61–1.24)
GFR, Estimated: >60 mL/min (ref >60)
Glucose, Bld: 103 mg/dL — ABNORMAL HIGH (ref 70–99)
Potassium: 3.9 mmol/L (ref 3.5–5.1)
Sodium: 135 mmol/L (ref 135–145)
Total Bilirubin: 0.8 mg/dL (ref 0.3–1.2)
Total Protein: 6.8 g/dL (ref 6.5–8.1)

## 2022-12-02 LAB — CBC
HCT: 31.1 % — ABNORMAL LOW (ref 39.0–52.0)
Hemoglobin: 9.7 g/dL — ABNORMAL LOW (ref 13.0–17.0)
MCH: 26.6 pg (ref 26.0–34.0)
MCHC: 31.2 g/dL (ref 30.0–36.0)
MCV: 85.4 fL (ref 80.0–100.0)
Platelets: 252 10*3/uL (ref 150–400)
RBC: 3.64 MIL/uL — ABNORMAL LOW (ref 4.22–5.81)
RDW: 16.3 % — ABNORMAL HIGH (ref 11.5–15.5)
WBC: 7.6 10*3/uL (ref 4.0–10.5)
nRBC: 0 % (ref 0.0–0.2)

## 2022-12-02 LAB — CREATININE, SERUM
Creatinine, Ser: 0.93 mg/dL (ref 0.61–1.24)
GFR, Estimated: >60 mL/min (ref >60)

## 2022-12-02 LAB — CBG MONITORING, ED
Glucose-Capillary: 120 mg/dL — ABNORMAL HIGH (ref 70–99)
Glucose-Capillary: 92 mg/dL (ref 70–99)

## 2022-12-02 LAB — GLUCOSE, CAPILLARY: Glucose-Capillary: 117 mg/dL — ABNORMAL HIGH (ref 70–99)

## 2022-12-02 LAB — LIPASE, BLOOD: Lipase: 34 U/L (ref 11–51)

## 2022-12-02 MED ORDER — ONDANSETRON HCL 4 MG/2ML IJ SOLN: 4.0000 mg | Freq: Four times a day (QID) | INTRAMUSCULAR | Status: DC | PRN

## 2022-12-02 MED ORDER — ATORVASTATIN CALCIUM 20 MG PO TABS: 20.0000 mg | ORAL_TABLET | Freq: Every day | ORAL | Status: DC

## 2022-12-02 MED ORDER — MORPHINE SULFATE (PF) 2 MG/ML IV SOLN
2.0000 mg | INTRAVENOUS | Status: DC | PRN
  Administered 2022-12-02 – 2022-12-03 (×4): 2 mg via INTRAVENOUS
  Filled 2022-12-02 (×4): qty 1

## 2022-12-02 MED ORDER — METOPROLOL TARTRATE 5 MG/5ML IV SOLN: 5.0000 mg | Freq: Four times a day (QID) | INTRAVENOUS | Status: DC | PRN

## 2022-12-02 MED ORDER — ACETAMINOPHEN 650 MG RE SUPP: 650.0000 mg | Freq: Four times a day (QID) | RECTAL | Status: DC | PRN

## 2022-12-02 MED ORDER — ONDANSETRON 4 MG PO TBDP
4.0000 mg | ORAL_TABLET | Freq: Once | ORAL | Status: AC
  Administered 2022-12-02: 4 mg via ORAL
  Filled 2022-12-02: qty 1

## 2022-12-02 MED ORDER — DIATRIZOATE MEGLUMINE & SODIUM 66-10 % PO SOLN: 90.0000 mL | Freq: Once | ORAL | Status: DC

## 2022-12-02 MED ORDER — TRAZODONE HCL 50 MG PO TABS
25.0000 mg | ORAL_TABLET | Freq: Every evening | ORAL | Status: DC | PRN
  Administered 2022-12-02: 25 mg via ORAL
  Filled 2022-12-02: qty 1

## 2022-12-02 MED ORDER — IOHEXOL 300 MG/ML  SOLN
100.0000 mL | Freq: Once | INTRAMUSCULAR | Status: AC | PRN
  Administered 2022-12-02: 100 mL via INTRAVENOUS

## 2022-12-02 MED ORDER — SODIUM CHLORIDE 0.9 % IV BOLUS
500.0000 mL | Freq: Once | INTRAVENOUS | Status: AC
  Administered 2022-12-02: 500 mL via INTRAVENOUS

## 2022-12-02 MED ORDER — SODIUM CHLORIDE 0.9 % IV SOLN: INTRAVENOUS | Status: DC

## 2022-12-02 MED ORDER — ENOXAPARIN SODIUM 40 MG/0.4ML IJ SOSY
40.0000 mg | PREFILLED_SYRINGE | INTRAMUSCULAR | Status: DC
  Administered 2022-12-02 – 2022-12-03 (×2): 40 mg via SUBCUTANEOUS
  Filled 2022-12-02 (×2): qty 0.4

## 2022-12-02 MED ORDER — ACETAMINOPHEN 325 MG PO TABS
650.0000 mg | ORAL_TABLET | Freq: Four times a day (QID) | ORAL | Status: DC | PRN
  Administered 2022-12-02: 650 mg via ORAL
  Filled 2022-12-02: qty 2

## 2022-12-02 MED ORDER — ONDANSETRON HCL 4 MG PO TABS: 4.0000 mg | ORAL_TABLET | Freq: Four times a day (QID) | ORAL | Status: DC | PRN

## 2022-12-02 MED ORDER — INSULIN ASPART 100 UNIT/ML IJ SOLN
0.0000 [IU] | INTRAMUSCULAR | Status: DC
  Filled 2022-12-02: qty 0.15

## 2022-12-02 MED ORDER — ONDANSETRON HCL 4 MG/2ML IJ SOLN: 4.0000 mg | Freq: Once | INTRAMUSCULAR | Status: DC

## 2022-12-02 NOTE — Progress Notes (Signed)
Full note to follow.  Lets do the small bowel protocol by mouth without placing an NG.  Only place NG for worsening symptoms of abdominal pain, nausea/vomiting.  Felicie Morn, MD General, Bariatric and Minimally Invasive Surgery [facility]

## 2022-12-02 NOTE — ED Provider Triage Note (Signed)
Emergency Medicine Provider Triage Evaluation Note  Ian Estes , a 82 y.o. male  was evaluated in triage.  Pt complains of abdominal pain. States it has been having periumbilical abdominal pain for about 3 days. Has had nausea without vomiting. No diarrhea. Decreased PO intake. No dysuria or fevers. History of diverticulitis..  Review of Systems  Positive: See above Negative:   Physical Exam  BP 119/80 (BP Location: Left Arm)   Pulse 87   Temp 97.9 F (36.6 C)   Resp 16   Ht 6' (1.829 m)   Wt 89.4 kg   SpO2 100%   BMI 26.72 kg/m  Gen:   Awake, no distress   Resp:  Normal effort  MSK:   Moves extremities without difficulty  Other:  Abdomen rounded, soft.   Medical Decision Making  Medically screening exam initiated at 1:51 PM.  Appropriate orders placed.  Astor Dolch was informed that the remainder of the evaluation will be completed by another provider, this initial triage assessment does not replace that evaluation, and the importance of remaining in the ED until their evaluation is complete.  Will CT given history.    Mickie Hillier, PA-C 12/02/22 1352

## 2022-12-02 NOTE — H&P (Signed)
History and Physical  Ian Estes [id number] DOB: Jan 12, 1941 DOA: 12/02/2022   PCP: Aletha Halim., PA-C   Patient coming from: Home  Chief Complaint: Abdominal pain  HPI: Ian Estes is a 82 y.o. male with medical history significant for 3 CKD and associated anemia, atrial fibrillation on Eliquis, tension, hyperlipidemia, and recent hospital stay for acute diverticulitis and discovery of a ascending colon mass who is being admitted to the hospital with recurrent abdominal pain found to have small bowel obstruction.  History is provided by the patient as well as his son who is with him at the bedside this afternoon in the ER.  They tell me he has been doing decently well, recently completed a 7-day course of Augmentin earlier this month.  He has been doing relatively well, son feels that he is starting to get some of his strength back and eating better.  However, starting for the last 2 to 3 days, the patient states he has been having some epigastric abdominal pain, without radiation.  There is associated nausea, without vomiting.  The patient feels that he had a normal bowel movement about 2 days ago.  Since that time, he has not eaten very much at all, has not had much of an appetite.  Denies any fevers, chills, chest pain, dizziness.  No blood seen in the stool.  Came to the ER for evaluation due to his abdominal pain.  ED Course: In the emergency department, patient was found to have blood pressure 99/63, which improved to 119/80 with IV fluids.  Lab work reveals normal white blood cell count, stable anemia with hemoglobin 11, stable baseline renal function.  CT abdomen pelvis was performed with contrast, and shows evidence of bowel obstruction with transition point in the central abdomen.  ED provider discussed with general surgery who will see the patient in consultation, and requests admission to the hospitalist service.  Only, the patient is resting comfortably in the ER with his  son at the bedside.  Says that his pain is significantly improved.  Review of Systems: Please see HPI for pertinent positives and negatives. A complete 10 system review of systems are otherwise negative.  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"   Diverticulitis    Gout    High cholesterol    Hypertension    Obesity    Pneumonia 2014 X 1   Prostate cancer ([place])    Type II diabetes mellitus ([place])    Past Surgical History:  Procedure Laterality Date   INCISION AND DRAINAGE PERIRECTAL ABSCESS  1990's   "hemorrhoid"   KNEE ARTHROSCOPY Right ~ 2011   PROSTATE SURGERY  1996   SKIN CANCER EXCISION  ~ 2005 X 2   "left foot"   SUBXYPHOID PERICARDIAL WINDOW N/A 12/21/2018   Procedure: SUBXYPHOID PERICARDIAL WINDOW;  Surgeon: Ivin Poot, MD;  Location: [facility];  Service: Thoracic;  Laterality: N/A;   TEE WITHOUT CARDIOVERSION N/A 12/21/2018   Procedure: TRANSESOPHAGEAL ECHOCARDIOGRAM (TEE);  Surgeon: Prescott Gum, Collier Salina, MD;  Location: [facility] OR;  Service: Thoracic;  Laterality: N/A;    Social History:  reports that he quit smoking about 35 years ago. His smoking use included cigarettes. He has a 48.00 pack-year smoking history. He has never used smokeless tobacco. He reports current alcohol use of about 21.0 standard drinks of alcohol per week. He  reports that he does not use drugs.   No Known Allergies  Family History  Problem Relation Age of Onset   CAD Neg Hx      Prior to Admission medications   Medication Sig Start Date End Date Taking? Authorizing Provider  apixaban (ELIQUIS) 5 MG TABS tablet Take 1 tablet (5 mg total) by mouth 2 (two) times daily. 04/17/22  Yes Minus Breeding, MD  atorvastatin (LIPITOR) 20 MG tablet Take 20 mg by mouth daily.   Yes [provider]  Ensure (ENSURE) Take 237 mLs by mouth 2 (two) times daily between meals.   Yes [provider]  metoprolol succinate (TOPROL-XL) 25 MG 24 hr tablet Take 0.5 tablets (12.5 mg total) by mouth daily. 11/12/22  Yes Lavina Hamman, MD  ondansetron (ZOFRAN) 4 MG tablet Take 4 mg by mouth every 8 (eight) hours as needed for nausea or vomiting. 11/06/22  Yes [provider]  Pedialyte (PEDIALYTE) SOLN Take 200 mLs by mouth 5 (five) times daily.   Yes [provider]  TYLENOL 500 MG tablet Take 1,000 mg by mouth every 6 (six) hours as needed for mild pain or headache.   Yes [provider]  acetaminophen (TYLENOL) 325 MG tablet Take 2 tablets (650 mg total) by mouth every 6 (six) hours as needed for mild pain, fever or headache. Patient not taking: Reported on 12/02/2022 10/30/22   Lavina Hamman, MD  allopurinol (ZYLOPRIM) 100 MG tablet Take 100 mg by mouth daily. Patient not taking: Reported on 12/02/2022    [provider]  clonazePAM (KLONOPIN) 0.5 MG tablet Take 0.5 mg by mouth daily as needed for anxiety (or rest). Patient not taking: Reported on 12/02/2022    [provider]  Ensure Max Protein (ENSURE MAX PROTEIN) LIQD Take 330 mLs (11 oz total) by mouth 3 (three) times daily. Patient not taking: Reported on 12/02/2022 11/12/22   Lavina Hamman, MD  folic acid (FOLVITE) 1 MG tablet Take 1 tablet (1 mg total) by mouth daily. Patient not taking: Reported on 12/02/2022 12/25/18   Antony Odea, PA-C  furosemide (LASIX) 20 MG tablet Take 1 tablet (20 mg total) by mouth daily as needed (for weight gain of >3 lbs in 1 day or >5 lbs in 1 week or for edema in legs). Patient not taking: Reported on 12/02/2022 10/30/22 10/30/23  Lavina Hamman, MD  pantoprazole (PROTONIX) 40 MG tablet Take 40 mg by mouth daily. Patient not taking: Reported on 12/02/2022 09/24/22   [provider]  saccharomyces boulardii (FLORASTOR) 250 MG capsule Take 1 capsule (250 mg total) by mouth 2 (two) times daily. Patient not taking: Reported on 12/02/2022  10/29/22   Lavina Hamman, MD  traMADol (ULTRAM) 50 MG tablet Take 1 tablet (50 mg total) by mouth every 8 (eight) hours as needed for moderate pain or severe pain. Patient not taking: Reported on 12/02/2022 10/30/22 10/30/23  Lavina Hamman, MD    Physical Exam: BP (!) 110/57   Pulse (!) 53   Temp (!) 96.3 F (35.7 C) (Axillary)   Resp 14   Ht 6' (1.829 m)   Wt 89.4 kg   SpO2 100%   BMI 26.72 kg/m   General:  Alert, oriented, calm, in no acute distress, appears tired but not acutely ill Eyes: EOMI, clear conjuctivae, white sclerea Neck: supple, no masses, trachea mildline  Cardiovascular: RRR, no murmurs or rubs, no peripheral edema  Respiratory: clear to auscultation bilaterally,  no wheezes, no crackles  Abdomen: soft, tender epigastrium but no rebound, nondistended, normal bowel tones heard  Skin: dry, no rashes  Musculoskeletal: no joint effusions, normal range of motion  Psychiatric: appropriate affect, normal speech  Neurologic: extraocular muscles intact, clear speech, moving all extremities with intact sensorium            Labs on Admission:  Basic Metabolic Panel: Recent Labs  Lab 12/02/22 1400  NA 135  K 3.9  CL 105  CO2 20*  GLUCOSE 103*  BUN 12  CREATININE 1.00  CALCIUM 8.5*   Liver Function Tests: Recent Labs  Lab 12/02/22 1400  AST 23  ALT 15  ALKPHOS 135*  BILITOT 0.8  PROT 6.8  ALBUMIN 2.9*   Recent Labs  Lab 12/02/22 1400  LIPASE 34   No results for input(s): "AMMONIA" in the last 168 hours. CBC: Recent Labs  Lab 12/02/22 1400  WBC 8.0  NEUTROABS 5.3  HGB 11.2*  HCT 38.6*  MCV 89.8  PLT 282   Cardiac Enzymes: No results for input(s): "CKTOTAL", "CKMB", "CKMBINDEX", "TROPONINI" in the last 168 hours.  BNP (last 3 results) No results for input(s): "BNP" in the last 8760 hours.  ProBNP (last 3 results) No results for input(s): "PROBNP" in the last 8760 hours.  CBG: Recent Labs  Lab 12/02/22 1401  GLUCAP 120*    Radiological Exams on Admission: CT Abdomen Pelvis W Contrast  Result Date: 12/02/2022 CLINICAL DATA:  Diverticulitis EXAM: CT ABDOMEN AND PELVIS WITH CONTRAST TECHNIQUE: Multidetector CT imaging of the abdomen and pelvis was performed using the standard protocol following bolus administration of intravenous contrast. RADIATION DOSE REDUCTION: This exam was performed according to the departmental dose-optimization program which includes automated exposure control, adjustment of the mA and/or kV according to patient size and/or use of iterative reconstruction technique. CONTRAST:  127m OMNIPAQUE IOHEXOL 300 MG/ML  SOLN COMPARISON:  CT abdomen and pelvis 11/08/2022 FINDINGS: Lower chest: There are chronic appearing interstitial opacities peripherally in the lung bases, unchanged. Hepatobiliary: No focal liver abnormality is seen. No gallstones, gallbladder wall thickening, or biliary dilatation. Pancreas: Unremarkable. No pancreatic ductal dilatation or surrounding inflammatory changes. Spleen: Normal in size without focal abnormality. Adrenals/Urinary Tract: Adrenal glands are unremarkable. There is no hydronephrosis or perinephric fat stranding. 16 mm left renal cyst is unchanged. Bladder is unremarkable. Stomach/Bowel: There are few dilated jejunal loops in the left upper quadrant measuring up to 4.1 cm in diameter. Transition point is seen in the central abdomen image 2/46 where there is caliber change of small bowel with some surrounding inflammatory stranding. Distal small bowel is nondilated. Percutaneous drainage catheter has been removed in the interval. No recurrent fluid collection identified. Annular mass in the ascending colon is unchanged. There is diffuse colonic diverticulosis without evidence for acute diverticulitis. The appendix is within normal limits. The stomach is within normal limits. Vascular/Lymphatic: Aortic atherosclerosis. No enlarged abdominal or pelvic lymph nodes. Reproductive:  Prostate gland well seen. Other: There are small fat containing bilateral inguinal hernias. There is no ascites. There is mild presacral edema which is nonspecific and unchanged. Musculoskeletal: No acute or significant osseous findings. IMPRESSION: 1. Small-bowel obstruction with transition point in the central abdomen. There is some surrounding inflammatory stranding. 2. Colonic diverticulosis without evidence for acute diverticulitis. 3. 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 4. Stable presacral edema. 5. Left Bosniak I benign renal cyst measuring 1.6 cm. No follow-up imaging is recommended. JACR 2018 Feb; 264-273, Management of the Incidental Renal  Mass on CT, [employer] 2021; [id number], Bosniak Classification of Cystic Renal Masses, Version 2019. Aortic Atherosclerosis (ICD10-I70.0). Electronically Signed   By: Ronney Asters M.D.   On: 12/02/2022 16:12    Assessment/Plan  Principal Problem:   SBO (small bowel obstruction) (HCC) -SBO seen on CT, with transition point in the central abdomen.  He is currently not nauseated, has not vomited. -Patient admission -N.p.o. -IV fluids -Pain and nausea control as needed -He will need NG tube placement in case of worsening nausea/vomiting -Appreciate general surgery consultation Active Problems:   Diverticulitis of intestine with perforation and abscess -recently completed treatment and had IR drain removed this past month.  Currently no signs or symptoms of recurrent diverticulitis or other acute infection.   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[place])   Diabetes mellitus without complication ([place]) -will place on sliding scale insulin every 4 hours   Hypertension -hold home antihypertensives, due to relative hypotension   Hyperlipidemia   GERD (gastroesophageal reflux disease)   Colonic  mass -he will need outpatient GI consultation for this issue, discussed once again with the patient and his son at the time of admission   Chronic anemia -will bleed due to his CKD, hemoglobin is at baseline   Hypotension  DVT prophylaxis: Lovenox and SCDs   Code Status: FULL   Family Communication: Plan of care discussed in detail with the patient and his son at the bedside at the time of admission this afternoon.  All questions were answered to their satisfaction, and they are in agreement with the plan.  Disposition Plan: Home with son at discharge.  Consults called: EDP consulted general surgery.  Admission status: inpatient  Time spent: 48 minutes  Mariellen Blaney Neva Seat MD [facility] Pager [phone]  If 7PM-7AM, please contact night-coverage [email or web address] Password [facility]  12/02/2022, 5:32 PM

## 2022-12-02 NOTE — ED Triage Notes (Signed)
Patient arrives with son c/o mid abdominal pain since yesterday with mild nausea. Patients son states over the week patient has been walking more, drinking Pedialyte and eating more protein however son feels like patient has declined over the past couple days.

## 2022-12-02 NOTE — ED Provider Notes (Signed)
EMERGENCY DEPARTMENT AT [facility] Provider Note   CSN: [id number] Arrival date & time: 12/02/22  1316     History  Chief Complaint  Patient presents with   Abdominal Pain    Ian Estes is a 82 y.o. male.  With past medical history of diabetes, hypertension, hyperlipidemia, chronic atrial fibrillation anticoagulated on Eliquis, CKD stage III who presents to the emergency department with abdominal pain.  Patient states that he has been having about 3 days of periumbilical abdominal pain.  He describes the pain as intermittent and cramping.  It radiates throughout his lower abdomen.  He has had nausea without vomiting as well as decreased p.o. intake and fatigue.  He denies having fever, diarrhea, dysuria.  Notably, he recently had diverticulitis and was admitted 11/08/2022 - 11/12/2022.  Imaging found to suspicious mass in the colon.  He also required percutaneous drain at that time which has since been removed.  Abdominal Pain Associated symptoms: fatigue and nausea   Associated symptoms: no diarrhea and no vomiting        Home Medications Prior to Admission medications   Medication Sig Start Date End Date Taking? Authorizing Provider  apixaban (ELIQUIS) 5 MG TABS tablet Take 1 tablet (5 mg total) by mouth 2 (two) times daily. 04/17/22  Yes Minus Breeding, MD  atorvastatin (LIPITOR) 20 MG tablet Take 20 mg by mouth daily.   Yes [provider]  Ensure (ENSURE) Take 237 mLs by mouth 2 (two) times daily between meals.   Yes [provider]  metoprolol succinate (TOPROL-XL) 25 MG 24 hr tablet Take 0.5 tablets (12.5 mg total) by mouth daily. 11/12/22  Yes Lavina Hamman, MD  ondansetron (ZOFRAN) 4 MG tablet Take 4 mg by mouth every 8 (eight) hours as needed for nausea or vomiting. 11/06/22  Yes [provider]  Pedialyte (PEDIALYTE) SOLN Take 200 mLs by mouth 5 (five) times daily.   Yes [provider]  TYLENOL 500 MG tablet  Take 1,000 mg by mouth every 6 (six) hours as needed for mild pain or headache.   Yes [provider]  acetaminophen (TYLENOL) 325 MG tablet Take 2 tablets (650 mg total) by mouth every 6 (six) hours as needed for mild pain, fever or headache. Patient not taking: Reported on 12/02/2022 10/30/22   Lavina Hamman, MD  allopurinol (ZYLOPRIM) 100 MG tablet Take 100 mg by mouth daily. Patient not taking: Reported on 12/02/2022    [provider]  clonazePAM (KLONOPIN) 0.5 MG tablet Take 0.5 mg by mouth daily as needed for anxiety (or rest). Patient not taking: Reported on 12/02/2022    [provider]  Ensure Max Protein (ENSURE MAX PROTEIN) LIQD Take 330 mLs (11 oz total) by mouth 3 (three) times daily. Patient not taking: Reported on 12/02/2022 11/12/22   Lavina Hamman, MD  folic acid (FOLVITE) 1 MG tablet Take 1 tablet (1 mg total) by mouth daily. Patient not taking: Reported on 12/02/2022 12/25/18   Antony Odea, PA-C  furosemide (LASIX) 20 MG tablet Take 1 tablet (20 mg total) by mouth daily as needed (for weight gain of >3 lbs in 1 day or >5 lbs in 1 week or for edema in legs). Patient not taking: Reported on 12/02/2022 10/30/22 10/30/23  Lavina Hamman, MD  pantoprazole (PROTONIX) 40 MG tablet Take 40 mg by mouth daily. Patient not taking: Reported on 12/02/2022 09/24/22   [provider]  saccharomyces boulardii (FLORASTOR) 250 MG  capsule Take 1 capsule (250 mg total) by mouth 2 (two) times daily. Patient not taking: Reported on 12/02/2022 10/29/22   Lavina Hamman, MD  traMADol (ULTRAM) 50 MG tablet Take 1 tablet (50 mg total) by mouth every 8 (eight) hours as needed for moderate pain or severe pain. Patient not taking: Reported on 12/02/2022 10/30/22 10/30/23  Lavina Hamman, MD      Allergies    Patient has no known allergies.    Review of Systems   Review of Systems  Constitutional:  Positive for fatigue.  Gastrointestinal:  Positive for abdominal pain  and nausea. Negative for diarrhea and vomiting.  All other systems reviewed and are negative.   Physical Exam Updated Vital Signs BP (!) 110/57   Pulse (!) 53   Temp (!) 96.3 F (35.7 C) (Axillary)   Resp 14   Ht 6' (1.829 m)   Wt 89.4 kg   SpO2 100%   BMI 26.72 kg/m  Physical Exam Vitals and nursing note reviewed.  Constitutional:      General: He is not in acute distress.    Appearance: Normal appearance. He is well-developed. He is not ill-appearing or toxic-appearing.  HENT:     Head: Normocephalic.     Mouth/Throat:     Pharynx: Oropharynx is clear.  Eyes:     General: No scleral icterus. Cardiovascular:     Rate and Rhythm: Normal rate and regular rhythm.     Heart sounds: Normal heart sounds. No murmur heard. Pulmonary:     Effort: Pulmonary effort is normal. No respiratory distress.     Breath sounds: Normal breath sounds.  Abdominal:     General: Abdomen is protuberant. Bowel sounds are decreased. There is no distension.     Palpations: Abdomen is soft.     Tenderness: There is abdominal tenderness in the right lower quadrant, periumbilical area, suprapubic area and left lower quadrant. There is no rebound.  Skin:    General: Skin is warm and dry.     Capillary Refill: Capillary refill takes less than 2 seconds.  Neurological:     General: No focal deficit present.     Mental Status: He is alert and oriented to person, place, and time.  Psychiatric:        Mood and Affect: Mood normal.        Behavior: Behavior normal.     ED Results / Procedures / Treatments   Labs (all labs ordered are listed, but only abnormal results are displayed) Labs Reviewed  COMPREHENSIVE METABOLIC PANEL - Abnormal; Notable for the following components:      Result Value   CO2 20 (*)    Glucose, Bld 103 (*)    Calcium 8.5 (*)    Albumin 2.9 (*)    Alkaline Phosphatase 135 (*)    All other components within normal limits  CBC WITH DIFFERENTIAL/PLATELET - Abnormal; Notable  for the following components:   Hemoglobin 11.2 (*)    HCT 38.6 (*)    MCHC 29.0 (*)    RDW 16.5 (*)    All other components within normal limits  CBG MONITORING, ED - Abnormal; Notable for the following components:   Glucose-Capillary 120 (*)    All other components within normal limits  LIPASE, BLOOD  URINALYSIS, ROUTINE W REFLEX MICROSCOPIC    EKG None  Radiology CT Abdomen Pelvis W Contrast  Result Date: 12/02/2022 CLINICAL DATA:  Diverticulitis EXAM: CT ABDOMEN AND PELVIS WITH CONTRAST TECHNIQUE: Multidetector  CT imaging of the abdomen and pelvis was performed using the standard protocol following bolus administration of intravenous contrast. RADIATION DOSE REDUCTION: This exam was performed according to the departmental dose-optimization program which includes automated exposure control, adjustment of the mA and/or kV according to patient size and/or use of iterative reconstruction technique. CONTRAST:  136m OMNIPAQUE IOHEXOL 300 MG/ML  SOLN COMPARISON:  CT abdomen and pelvis 11/08/2022 FINDINGS: Lower chest: There are chronic appearing interstitial opacities peripherally in the lung bases, unchanged. Hepatobiliary: No focal liver abnormality is seen. No gallstones, gallbladder wall thickening, or biliary dilatation. Pancreas: Unremarkable. No pancreatic ductal dilatation or surrounding inflammatory changes. Spleen: Normal in size without focal abnormality. Adrenals/Urinary Tract: Adrenal glands are unremarkable. There is no hydronephrosis or perinephric fat stranding. 16 mm left renal cyst is unchanged. Bladder is unremarkable. Stomach/Bowel: There are few dilated jejunal loops in the left upper quadrant measuring up to 4.1 cm in diameter. Transition point is seen in the central abdomen image 2/46 where there is caliber change of small bowel with some surrounding inflammatory stranding. Distal small bowel is nondilated. Percutaneous drainage catheter has been removed in the interval. No  recurrent fluid collection identified. Annular mass in the ascending colon is unchanged. There is diffuse colonic diverticulosis without evidence for acute diverticulitis. The appendix is within normal limits. The stomach is within normal limits. Vascular/Lymphatic: Aortic atherosclerosis. No enlarged abdominal or pelvic lymph nodes. Reproductive: Prostate gland well seen. Other: There are small fat containing bilateral inguinal hernias. There is no ascites. There is mild presacral edema which is nonspecific and unchanged. Musculoskeletal: No acute or significant osseous findings. IMPRESSION: 1. Small-bowel obstruction with transition point in the central abdomen. There is some surrounding inflammatory stranding. 2. Colonic diverticulosis without evidence for acute diverticulitis. 3. 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 4. Stable presacral edema. 5. Left Bosniak I benign renal cyst measuring 1.6 cm. No follow-up imaging is recommended. JACR 2018 Feb; 264-273, Management of the Incidental Renal Mass on CT, [employer] 2021; 814-848, Bosniak Classification of Cystic Renal Masses, Version 2019. Aortic Atherosclerosis (ICD10-I70.0). Electronically Signed   By: ARonney AstersM.D.   On: 12/02/2022 16:12    Procedures Procedures   Medications Ordered in ED Medications  sodium chloride 0.9 % bolus 500 mL (500 mLs Intravenous New Bag/Given 12/02/22 1528)  ondansetron (ZOFRAN-ODT) disintegrating tablet 4 mg (4 mg Oral Given 12/02/22 1503)  iohexol (OMNIPAQUE) 300 MG/ML solution 100 mL (100 mLs Intravenous Contrast Given 12/02/22 1554)    ED Course/ Medical Decision Making/ A&P    Medical Decision Making Amount and/or Complexity of Data Reviewed Labs: ordered. Radiology: ordered.  Risk Prescription drug management. Decision regarding hospitalization.  Initial Impression and Ddx 82year old male who presents to the emergency department with periumbilical abdominal pain over the past 3 days and  decreased p.o. intake. Patient PMH that increases complexity of ED encounter: Diverticulitis, obesity, hypertension, type 2 diabetes Differential: Acute hepatobiliary disease, pancreatitis, appendicitis, PUD, gastritis, SBO, diverticulitis, colitis, viral gastroenteritis, Crohn's, UC, vascular catastrophe, UTI, pyelonephritis, renal stone, obstructed stone, infected stone, testicular torsion, epididymitis, incarcerated hernia, STD, etc.    Interpretation of Diagnostics I independent reviewed and interpreted the labs as followed: CBC without leukocytosis, stable anemia.  CMP with stable findings, lipase is negative, UA is pending  - I independently visualized the following imaging with scope of interpretation limited to determining acute life threatening conditions related to emergency care: CT abdomen pelvis, which revealed small bowel obstruction, stable mass  Patient Reassessment and Ultimate Disposition/Management  82 year old male who presents to the emergency department with periumbilical abdominal pain over the past 3 days and decreased p.o. intake. On initial assessment he is well-appearing, nonseptic and nontoxic in appearance.  He does have some tenderness palpation of the periumbilical region as well as the bilateral lower quadrants without rebound tenderness.  No peritonitic findings.  No vomiting here in the emergency department. Given his recent diverticulitis that was complicated by requiring percutaneous drain, obtain new CT abdomen pelvis. Given IV fluids, Zofran with improvement in his nausea.  Labs are overall unremarkable  CT ab pelvis shows a small bowel obstruction, with transition point in the left upper abdomen.  Additionally he has a colonic mass which was seen on previous imaging that is stable.  No other acute findings in the abdomen.  Will not require NG tube as he is not vomiting at this time.  Will continue to reevaluate this.  I consulted and spoke with general  surgery, Alferd Apa, PA-C who will evaluate the patient but recommends hospitalist admission. Consulted and spoke with Dr. Renaee Munda who agrees to admit the patient.  And son at bedside who agrees with plan of care.  Patient management required discussion with the following services or consulting groups:  Hospitalist Service and General/Trauma Surgery  Complexity of Problems Addressed Acute complicated illness or Injury  Additional Data Reviewed and Analyzed Further history obtained from: Further history from spouse/family member, Past medical history and medications listed in the EMR, Prior ED visit notes, Care Everywhere, and Prior labs/imaging results  Patient Encounter Risk Assessment Consideration of hospitalization and Major procedures  Final Clinical Impression(s) / ED Diagnoses Final diagnoses:  Intestinal obstruction, unspecified cause, unspecified whether partial or complete [facility])    Rx / DC Orders ED Discharge Orders     None         Mickie Hillier, PA-C 12/02/22 1720    Horton, Alvin Critchley, DO 12/03/22 1533

## 2022-12-02 NOTE — Consult Note (Signed)
Consulting Physician: Nickola Major Josalin Carneiro  Referring Provider: Theodis Blaze, PA-C  Chief Complaint: Abdominal pain  Reason for Consult: Bowel obstruction   Subjective   HPI: Ian Estes is an 82 y.o. male who is here for abdominal pain.  He was recently for diverticulitis, spending most of January in the hospital.  He had a percutaneous drain placed and it may have migrated into the small bowel lumen.  It was able to be removed.  He was able to be discharged on 11/12/22.  He did well for a while at home.  Two days ago he developed abdominal pain.  The pain is in a band across the mid abdomen.  The pain comes and goes in waves and is very crampy.    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"   Diverticulitis    Gout    High cholesterol    Hypertension    Obesity    Pneumonia 2014 X 1   Prostate cancer ([place])    Type II diabetes mellitus ([place])     Past Surgical History:  Procedure Laterality Date   INCISION AND DRAINAGE PERIRECTAL ABSCESS  1990's   "hemorrhoid"   KNEE ARTHROSCOPY Right ~ 2011   PROSTATE SURGERY  1996   SKIN CANCER EXCISION  ~ 2005 X 2   "left foot"   SUBXYPHOID PERICARDIAL WINDOW N/A 12/21/2018   Procedure: SUBXYPHOID PERICARDIAL WINDOW;  Surgeon: Ivin Poot, MD;  Location: [facility];  Service: Thoracic;  Laterality: N/A;   TEE WITHOUT CARDIOVERSION N/A 12/21/2018   Procedure: TRANSESOPHAGEAL ECHOCARDIOGRAM (TEE);  Surgeon: Prescott Gum, Collier Salina, MD;  Location: [facility] OR;  Service: Thoracic;  Laterality: N/A;    Family History  Problem Relation Age of Onset   CAD Neg Hx     Social:  reports that he quit smoking about 35 years ago. His smoking use included cigarettes. He has a 48.00 pack-year smoking history. He has never used smokeless tobacco. He reports current alcohol use of about 21.0 standard drinks of alcohol per week. He reports that he does  not use drugs.  Allergies: No Known Allergies  Medications: Current Outpatient Medications  Medication Instructions   acetaminophen (TYLENOL) 650 mg, Oral, Every 6 hours PRN   allopurinol (ZYLOPRIM) 100 mg, Daily   apixaban (ELIQUIS) 5 mg, Oral, 2 times daily   atorvastatin (LIPITOR) 20 mg, Oral, Daily   clonazePAM (KLONOPIN) 0.5 mg, Daily PRN   Ensure (ENSURE) 237 mLs, Oral, 2 times daily between meals   Ensure Max Protein (ENSURE MAX PROTEIN) LIQD 11 oz, Oral, 3 times daily   folic acid (FOLVITE) 1 mg, Oral, Daily   furosemide (LASIX) 20 mg, Oral, Daily PRN   metoprolol succinate (TOPROL-XL) 12.5 mg, Oral, Daily   ondansetron (ZOFRAN) 4 mg, Oral, Every 8 hours PRN   pantoprazole (PROTONIX) 40 mg, Daily   Pedialyte (PEDIALYTE) SOLN 200 mLs, Oral, 5 times daily   saccharomyces boulardii (FLORASTOR) 250 mg, Oral, 2 times daily   traMADol (ULTRAM) 50 mg, Oral, Every 8 hours PRN   TYLENOL 1,000 mg, Oral, Every 6 hours PRN    ROS - all of the below systems have been reviewed with the patient and positives are indicated with bold text General: chills, fever or night sweats Eyes: blurry vision or double vision ENT: epistaxis or sore throat Allergy/Immunology: itchy/watery eyes or nasal  congestion Hematologic/Lymphatic: bleeding problems, blood clots or swollen lymph nodes Endocrine: temperature intolerance or unexpected weight changes Breast: new or changing breast lumps or nipple discharge Resp: cough, shortness of breath, or wheezing CV: chest pain or dyspnea on exertion GI: as per HPI GU: dysuria, trouble voiding, or hematuria MSK: joint pain or joint stiffness Neuro: TIA or stroke symptoms Derm: pruritus and skin lesion changes Psych: anxiety and depression  Objective   PE Blood pressure (!) 108/52, pulse (!) 44, temperature 98.1 F (36.7 C), resp. rate 18, height 6' (1.829 m), weight 89.4 kg, SpO2 97 %. Constitutional: NAD; conversant; no deformities Eyes: Moist  conjunctiva; no lid lag; anicteric; PERRL Neck: Trachea midline; no thyromegaly Lungs: Normal respiratory effort; no tactile fremitus CV: RRR; no palpable thrills; no pitting edema GI: Abd Soft, mild tenderness; no palpable hepatosplenomegaly MSK: Normal range of motion of extremities; no clubbing/cyanosis Psychiatric: Appropriate affect; alert and oriented x3 Lymphatic: No palpable cervical or axillary lymphadenopathy  Results for orders placed or performed during the hospital encounter of 12/02/22 (from the past 24 hour(s))  Comprehensive metabolic panel     Status: Abnormal   Collection Time: 12/02/22  2:00 PM  Result Value Ref Range   Sodium 135 135 - 145 mmol/L   Potassium 3.9 3.5 - 5.1 mmol/L   Chloride 105 98 - 111 mmol/L   CO2 20 (L) 22 - 32 mmol/L   Glucose, Bld 103 (H) 70 - 99 mg/dL   BUN 12 8 - 23 mg/dL   Creatinine, Ser 1.00 0.61 - 1.24 mg/dL   Calcium 8.5 (L) 8.9 - 10.3 mg/dL   Total Protein 6.8 6.5 - 8.1 g/dL   Albumin 2.9 (L) 3.5 - 5.0 g/dL   AST 23 15 - 41 U/L   ALT 15 0 - 44 U/L   Alkaline Phosphatase 135 (H) 38 - 126 U/L   Total Bilirubin 0.8 0.3 - 1.2 mg/dL   GFR, Estimated >60 >60 mL/min   Anion gap 10 5 - 15  Lipase, blood     Status: None   Collection Time: 12/02/22  2:00 PM  Result Value Ref Range   Lipase 34 11 - 51 U/L  CBC with Differential     Status: Abnormal   Collection Time: 12/02/22  2:00 PM  Result Value Ref Range   WBC 8.0 4.0 - 10.5 K/uL   RBC 4.30 4.22 - 5.81 MIL/uL   Hemoglobin 11.2 (L) 13.0 - 17.0 g/dL   HCT 38.6 (L) 39.0 - 52.0 %   MCV 89.8 80.0 - 100.0 fL   MCH 26.0 26.0 - 34.0 pg   MCHC 29.0 (L) 30.0 - 36.0 g/dL   RDW 16.5 (H) 11.5 - 15.5 %   Platelets 282 150 - 400 K/uL   nRBC 0.0 0.0 - 0.2 %   Neutrophils Relative % 66 %   Neutro Abs 5.3 1.7 - 7.7 K/uL   Lymphocytes Relative 20 %   Lymphs Abs 1.6 0.7 - 4.0 K/uL   Monocytes Relative 11 %   Monocytes Absolute 0.9 0.1 - 1.0 K/uL   Eosinophils Relative 2 %   Eosinophils  Absolute 0.1 0.0 - 0.5 K/uL   Basophils Relative 1 %   Basophils Absolute 0.1 0.0 - 0.1 K/uL   Immature Granulocytes 0 %   Abs Immature Granulocytes 0.03 0.00 - 0.07 K/uL  CBG monitoring, ED     Status: Abnormal   Collection Time: 12/02/22  2:01 PM  Result Value Ref Range   Glucose-Capillary  120 (H) 70 - 99 mg/dL  Urinalysis, Routine w reflex microscopic -Urine, Clean Catch     Status: None   Collection Time: 12/02/22  5:11 PM  Result Value Ref Range   Color, Urine YELLOW YELLOW   APPearance CLEAR CLEAR   Specific Gravity, Urine 1.024 1.005 - 1.030   pH 5.0 5.0 - 8.0   Glucose, UA NEGATIVE NEGATIVE mg/dL   Hgb urine dipstick NEGATIVE NEGATIVE   Bilirubin Urine NEGATIVE NEGATIVE   Ketones, ur NEGATIVE NEGATIVE mg/dL   Protein, ur NEGATIVE NEGATIVE mg/dL   Nitrite NEGATIVE NEGATIVE   Leukocytes,Ua NEGATIVE NEGATIVE  CBG monitoring, ED     Status: None   Collection Time: 12/02/22  6:01 PM  Result Value Ref Range   Glucose-Capillary 92 70 - 99 mg/dL  CBC     Status: Abnormal   Collection Time: 12/02/22  6:18 PM  Result Value Ref Range   WBC 7.6 4.0 - 10.5 K/uL   RBC 3.64 (L) 4.22 - 5.81 MIL/uL   Hemoglobin 9.7 (L) 13.0 - 17.0 g/dL   HCT 31.1 (L) 39.0 - 52.0 %   MCV 85.4 80.0 - 100.0 fL   MCH 26.6 26.0 - 34.0 pg   MCHC 31.2 30.0 - 36.0 g/dL   RDW 16.3 (H) 11.5 - 15.5 %   Platelets 252 150 - 400 K/uL   nRBC 0.0 0.0 - 0.2 %  Creatinine, serum     Status: None   Collection Time: 12/02/22  6:18 PM  Result Value Ref Range   Creatinine, Ser 0.93 0.61 - 1.24 mg/dL   GFR, Estimated >60 >60 mL/min     Imaging Orders         CT Abdomen Pelvis W Contrast         DG Abd Portable 1V-Small Bowel Protocol-Position Verification         DG Abd Portable 1V-Small Bowel Obstruction Protocol-initial, 8 hr delay      Assessment and Plan   Rosendo Coniglio is an 82 y.o. male with a small bowel obstruction.  I wonder if he has adhesions from his recent bout of diverticulitis requiring drain  placement.  I recommend trying the small bowel protocol by mouth, it does not appear he needs an NG now.  NG should be placed if symptoms worsen.  Surgery team will follow results of small bowel protocol.    ICD-10-CM   1. Intestinal obstruction, unspecified cause, unspecified whether partial or complete ([place])  K56.609     2. Small bowel obstruction ([facility])  K56.609 DG Abd Portable 1V-Small Bowel Obstruction Protocol-initial, 8 hr delay    DG Abd Portable 1V-Small Bowel Obstruction Protocol-initial, 8 hr delay       Felicie Morn, MD  [facility] Use [email or web address] to contact on call provider  New Patient Billing: [phone] - Moderate MDM

## 2022-12-03 ENCOUNTER — Inpatient Hospital Stay (HOSPITAL_COMMUNITY): Payer: Medicare HMO

## 2022-12-03 DIAGNOSIS — E440 Moderate protein-calorie malnutrition: Secondary | ICD-10-CM | POA: Insufficient documentation

## 2022-12-03 DIAGNOSIS — K56609 Unspecified intestinal obstruction, unspecified as to partial versus complete obstruction: Secondary | ICD-10-CM | POA: Diagnosis not present

## 2022-12-03 LAB — BASIC METABOLIC PANEL
Anion gap: 7 (ref 5–15)
BUN: 12 mg/dL (ref 8–23)
CO2: 19 mmol/L — ABNORMAL LOW (ref 22–32)
Calcium: 8 mg/dL — ABNORMAL LOW (ref 8.9–10.3)
Chloride: 107 mmol/L (ref 98–111)
Creatinine, Ser: 0.96 mg/dL (ref 0.61–1.24)
GFR, Estimated: >60 mL/min (ref >60)
Glucose, Bld: 93 mg/dL (ref 70–99)
Potassium: 4.2 mmol/L (ref 3.5–5.1)
Sodium: 133 mmol/L — ABNORMAL LOW (ref 135–145)

## 2022-12-03 LAB — CBC
HCT: 33.6 % — ABNORMAL LOW (ref 39.0–52.0)
Hemoglobin: 10.3 g/dL — ABNORMAL LOW (ref 13.0–17.0)
MCH: 26.8 pg (ref 26.0–34.0)
MCHC: 30.7 g/dL (ref 30.0–36.0)
MCV: 87.3 fL (ref 80.0–100.0)
Platelets: 228 10*3/uL (ref 150–400)
RBC: 3.85 MIL/uL — ABNORMAL LOW (ref 4.22–5.81)
RDW: 16.4 % — ABNORMAL HIGH (ref 11.5–15.5)
WBC: 6.2 10*3/uL (ref 4.0–10.5)
nRBC: 0 % (ref 0.0–0.2)

## 2022-12-03 LAB — GLUCOSE, CAPILLARY
Glucose-Capillary: 113 mg/dL — ABNORMAL HIGH (ref 70–99)
Glucose-Capillary: 94 mg/dL (ref 70–99)
Glucose-Capillary: 80 mg/dL (ref 70–99)
Glucose-Capillary: 93 mg/dL (ref 70–99)
Glucose-Capillary: 82 mg/dL (ref 70–99)
Glucose-Capillary: 93 mg/dL (ref 70–99)

## 2022-12-03 MED ORDER — INSULIN ASPART 100 UNIT/ML IJ SOLN: 0.0000 [IU] | Freq: Three times a day (TID) | INTRAMUSCULAR | Status: DC

## 2022-12-03 MED ORDER — DIATRIZOATE MEGLUMINE & SODIUM 66-10 % PO SOLN
90.0000 mL | Freq: Once | ORAL | Status: AC
  Administered 2022-12-03: 90 mL via ORAL
  Filled 2022-12-03: qty 90

## 2022-12-03 NOTE — Progress Notes (Signed)
[facility] Progress Note  Patient: Ian Estes     [id number]  DOA: 12/02/2022   PCP: Aletha Halim., PA-C       Brief hospital course: Zak Hyke, an 82 year old male, had a significant medical history including stage 3 CKD with associated anemia, atrial fibrillation managed with Eliquis, hypertension, hyperlipidemia, and a recent hospitalization for acute diverticulitis and discovery of an ascending colon mass. He was admitted to the hospital due to recurrent abdominal pain, which was diagnosed as a small bowel obstruction. Both the patient and his son provided the medical history at the bedside in the ER. They indicated that he had been fairly stable, having recently completed a 7-day course of Augmentin earlier in the month. While he had been improving and regaining strength with improved eating habits, he began experiencing epigastric abdominal pain without radiation, accompanied by nausea but no vomiting, over the past 2 to 3 days. Despite having a normal bowel movement about 2 days prior, he had since lost his appetite and consumed very little food. He denied experiencing fevers, chills, chest pain, or dizziness, and there was no report of blood in the stool. His presentation to the ER was prompted by the persistence of his abdominal pain.   Subjective:  He is passing gas. No BM. No other compliants.  Assessment and Plan: Principal Problem:  Small bowel obstruction (SBO) ([place]): SBO per CT scan, transition point in the central abdomen. 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  -Outpatient colonoscopy is already scheduled per  patient  History of diverticulitis of intestine with perforation and abscess: Recently completed treatment with IR drain removal in the past month.  Chronic atrial fibrillation (HCC):CHA2DS2-VASc Score 4 Patient appears to be in sinus rhythm Eliquis held in case surgical intervention is required during hospital stay.  Diabetes mellitus without complication [facility]): Sliding scale insulin every 4 hours initiated.  Hypertension:  - Metoprolol switched to IV PRN  Hyperlipidemia - hold Lipitor       Code Status: Full Code Consultants: general surgery Level of Care: Level of care: Med-Surg Total time on patient care: 35 DVT prophylaxis:  enoxaparin (LOVENOX) injection 40 mg Start: 12/02/22 2200 SCDs Start: 12/02/22 1730     Objective:   Vitals:   12/02/22 2139 12/03/22 0116 12/03/22 0422 12/03/22 0423  BP: (!) 108/52 (!) 107/48 100/60   Pulse: (!) 44 (!) 51 (!) 42 (!) 53  Resp: 18 19 15   $ Temp: 98.1 F (36.7 C) 97.7 F (36.5 C) 98.5 F (36.9 C)   TempSrc:  Oral    SpO2: 97% 99% 99% 100%  Weight:      Height:       Filed Weights   12/02/22 1333  Weight: 89.4 kg   Exam: General exam: Appears comfortable  HEENT: oral mucosa moist Respiratory system: Clear to auscultation.  Cardiovascular system: S1 & S2 heard  Gastrointestinal system: Abdomen soft, non-tender, nondistended. Normal bowel sounds   Extremities: No cyanosis, clubbing or edema Psychiatry:  Mood & affect appropriate.      CBC: Recent Labs  Lab 12/02/22 1400 12/02/22 1818 12/03/22 0347  WBC 8.0 7.6 6.2  NEUTROABS 5.3  --   --  HGB 11.2* 9.7* 10.3*  HCT 38.6* 31.1* 33.6*  MCV 89.8 85.4 87.3  PLT 282 252 [id number]   Basic Metabolic Panel: Recent Labs  Lab 12/02/22 1400 12/02/22 1818 12/03/22 0347  NA 135  --  133*  K 3.9  --  4.2  CL 105  --  107  CO2 20*  --  19*  GLUCOSE 103*  --  93  BUN 12  --  12  CREATININE 1.00 0.93 0.96  CALCIUM 8.5*  --  8.0*   GFR: Estimated Creatinine  Clearance: 65.1 mL/min (by C-G formula based on SCr of 0.96 mg/dL).  Scheduled Meds:  atorvastatin  20 mg Oral Daily   diatrizoate meglumine-sodium  90 mL Oral Once   enoxaparin (LOVENOX) injection  40 mg Subcutaneous Q24H   insulin aspart  0-15 Units Subcutaneous Q4H   Continuous Infusions:  sodium chloride 75 mL/hr at 12/03/22 [id number]   Imaging and lab data was personally reviewed CT Abdomen Pelvis W Contrast  Result Date: 12/02/2022 CLINICAL DATA:  Diverticulitis EXAM: CT ABDOMEN AND PELVIS WITH CONTRAST TECHNIQUE: Multidetector CT imaging of the abdomen and pelvis was performed using the standard protocol following bolus administration of intravenous contrast. RADIATION DOSE REDUCTION: This exam was performed according to the departmental dose-optimization program which includes automated exposure control, adjustment of the mA and/or kV according to patient size and/or use of iterative reconstruction technique. CONTRAST:  1103m OMNIPAQUE IOHEXOL 300 MG/ML  SOLN COMPARISON:  CT abdomen and pelvis 11/08/2022 FINDINGS: Lower chest: There are chronic appearing interstitial opacities peripherally in the lung bases, unchanged. Hepatobiliary: No focal liver abnormality is seen. No gallstones, gallbladder wall thickening, or biliary dilatation. Pancreas: Unremarkable. No pancreatic ductal dilatation or surrounding inflammatory changes. Spleen: Normal in size without focal abnormality. Adrenals/Urinary Tract: Adrenal glands are unremarkable. There is no hydronephrosis or perinephric fat stranding. 16 mm left renal cyst is unchanged. Bladder is unremarkable. Stomach/Bowel: There are few dilated jejunal loops in the left upper quadrant measuring up to 4.1 cm in diameter. Transition point is seen in the central abdomen image 2/46 where there is caliber change of small bowel with some surrounding inflammatory stranding. Distal small bowel is nondilated. Percutaneous drainage catheter has been removed in the  interval. No recurrent fluid collection identified. Annular mass in the ascending colon is unchanged. There is diffuse colonic diverticulosis without evidence for acute diverticulitis. The appendix is within normal limits. The stomach is within normal limits. Vascular/Lymphatic: Aortic atherosclerosis. No enlarged abdominal or pelvic lymph nodes. Reproductive: Prostate gland well seen. Other: There are small fat containing bilateral inguinal hernias. There is no ascites. There is mild presacral edema which is nonspecific and unchanged. Musculoskeletal: No acute or significant osseous findings. IMPRESSION: 1. Small-bowel obstruction with transition point in the central abdomen. There is some surrounding inflammatory stranding. 2. Colonic diverticulosis without evidence for acute diverticulitis. 3. 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 4. Stable presacral edema. 5. Left Bosniak I benign renal cyst measuring 1.6 cm. No follow-up imaging is recommended. JACR 2018 Feb; 264-273, Management of the Incidental Renal Mass on CT, [employer] 2021; 814-848, Bosniak Classification of Cystic Renal Masses, Version 2019. Aortic Atherosclerosis (ICD10-I70.0). Electronically Signed   By: ARonney AstersM.D.   On: 12/02/2022 16:12    LOS: 1 day   Author: SDebbe Odea 12/03/2022 8:46 AM  To contact [facility]>   Check the care team in [facility] look for the attending/consulting [place] listed  Log into [email or web address] and use [place]'s  universal password   Go to> "[facility]"  and find provider  If you still have difficulty reaching the provider, please page the [facility] (Director on Call) for the Hospitalists listed on amion

## 2022-12-03 NOTE — TOC CM/SW Note (Signed)
  Transition of Care (TOC) Screening Note   Patient Details  Name: Ian Estes Date of Birth: 1941/06/29   Transition of Care [facility]) CM/SW Contact:    Lennart Pall, LCSW Phone Number: 12/03/2022, 1:55 PM    Transition of Care Department [facility]) has reviewed patient and no TOC needs have been identified at this time. We will continue to monitor patient advancement through interdisciplinary progression rounds. If new patient transition needs arise, please place a TOC consult.

## 2022-12-03 NOTE — Plan of Care (Signed)
Problem: Education: Goal: Ability to describe self-care measures that may prevent or decrease complications (Diabetes Survival Skills Education) will improve Outcome: Progressing   Problem: Coping: Goal: Ability to adjust to condition or change in health will improve Outcome: Progressing   Problem: Clinical Measurements: Goal: Ability to maintain clinical measurements within normal limits will improve Outcome: [place], RN 12/03/22 11:58 AM

## 2022-12-03 NOTE — Progress Notes (Signed)
Initial Nutrition Assessment  DOCUMENTATION CODES:   Non-severe (moderate) malnutrition in context of chronic illness  INTERVENTION:  - Advance diet as medically appropriate.  - Recommend Boost Plus po BID once diet advanced, each supplement provides 360 kcal and 14 grams of protein - Encourage intake as tolerated.  - Recommend daily multivitamin once diet advanced. - Monitor weight trends.    NUTRITION DIAGNOSIS:   Moderate Malnutrition related to chronic illness as evidenced by moderate fat depletion, mild muscle depletion.  GOAL:   Patient will meet greater than or equal to 90% of their needs  MONITOR:   PO intake, Diet advancement  REASON FOR ASSESSMENT:   Malnutrition Screening Tool    ASSESSMENT:   82 y.o. male with medical history significant for 3 CKD and associated anemia, AFib on Eliquis, HLD, and recent hospital stay for acute diverticulitis and discovery of a ascending colon mass who is being admitted to the hospital with recurrent abdominal pain found to have small bowel obstruction.  Patient reports a UBW of 250# and a 20# weight loss over the past 6 months.  Per EMR, pt has not weighed as much as 250# since 2018.  No weight history in the past year prior to January. Patient weighed at 211# on 1/7 and then 197# on 1/21. This could indicate a 14# or 6.6% weight loss in 2 weeks, significant for the time frame. Weight stable since at time.   Patient endorses his eating depends on the day as he has good and bad days. On good days he will eat 2 meals a day plus snacks but on bad days he may only eat 1 meal. Drinks Boost regularly at home, usually 2 times a day but sometimes as much as 3 times a day.   Current appetite is decreased and patient states that although he hasn't eaten in 3 days he is not hungry. Patient initially advanced to clear liquids this morning but made NPO again as patient had gastrograffin this morning and IR informed RN that liquids may dilute  it.   Patient agreeable to receive Boost Plus once diet advanced.   Medications reviewed and include: Insulin  Labs reviewed: Na 133   NUTRITION - FOCUSED PHYSICAL EXAM:  Flowsheet Row Most Recent Value  Orbital Region Moderate depletion  Upper Arm Region Moderate depletion  Thoracic and Lumbar Region Mild depletion  Buccal Region Moderate depletion  Temple Region Severe depletion  Clavicle Bone Region Mild depletion  Clavicle and Acromion Bone Region Mild depletion  Scapular Bone Region Unable to assess  Dorsal Hand Mild depletion  Patellar Region Mild depletion  Anterior Thigh Region Mild depletion  Posterior Calf Region Moderate depletion  Edema (RD Assessment) None  Hair Reviewed  Eyes Reviewed  Mouth Reviewed  Skin Reviewed  Nails Reviewed       Diet Order:   Diet Order             Diet NPO time specified Except for: Sips with Meds, Ice Chips  Diet effective now                   EDUCATION NEEDS:  Education needs have been addressed  Skin:  Skin Assessment: Reviewed RN Assessment  Last BM:  2/12  Height:  Ht Readings from Last 1 Encounters:  12/02/22 6' (1.829 m)   Weight:  Wt Readings from Last 1 Encounters:  12/02/22 89.4 kg    BMI:  Body mass index is 26.72 kg/m.  Estimated Nutritional Needs:  Kcal:  2000-2200 kcals Protein:  100-115 grams Fluid:  >/= 2L    Samson Frederic RD, LDN For contact information, refer to [place].

## 2022-12-03 NOTE — Progress Notes (Signed)
Subjective: CC: Lower abdominal pain and nausea have resolved. No vomiting. Continues to pass flatus. No bm. Last dose of pain medication was at 2318 last night.   Objective: Vital signs in last 24 hours: Temp:  [96.3 F (35.7 C)-98.5 F (36.9 C)] 98.5 F (36.9 C) (02/15 0422) Pulse Rate:  [42-87] 53 (02/15 0423) Resp:  [14-19] 15 (02/15 0422) BP: (99-132)/(48-80) 100/60 (02/15 0422) SpO2:  [97 %-100 %] 100 % (02/15 0423) Weight:  [89.4 kg] 89.4 kg (02/14 1333) Last BM Date : 11/30/22  Intake/Output from previous day: 02/14 0701 - 02/15 0700 In: -  Out: 225 [Urine:225] Intake/Output this shift: Total I/O In: 995 [I.V.:995] Out: -   PE: Gen:  Alert, NAD, pleasant Abd: Soft, ND to very mild distension, NT, +BS  Lab Results:  Recent Labs    12/02/22 1818 12/03/22 0347  WBC 7.6 6.2  HGB 9.7* 10.3*  HCT 31.1* 33.6*  PLT 252 228   BMET Recent Labs    12/02/22 1400 12/02/22 1818 12/03/22 0347  NA 135  --  133*  K 3.9  --  4.2  CL 105  --  107  CO2 20*  --  19*  GLUCOSE 103*  --  93  BUN 12  --  12  CREATININE 1.00 0.93 0.96  CALCIUM 8.5*  --  8.0*   PT/INR No results for input(s): "LABPROT", "INR" in the last 72 hours. CMP     Component Value Date/Time   NA 133 (L) 12/03/2022 0347   NA 138 01/12/2019 0852   K 4.2 12/03/2022 0347   CL 107 12/03/2022 0347   CO2 19 (L) 12/03/2022 0347   GLUCOSE 93 12/03/2022 0347   BUN 12 12/03/2022 0347   BUN 20 01/12/2019 0852   CREATININE 0.96 12/03/2022 0347   CALCIUM 8.0 (L) 12/03/2022 0347   PROT 6.8 12/02/2022 1400   ALBUMIN 2.9 (L) 12/02/2022 1400   AST 23 12/02/2022 1400   ALT 15 12/02/2022 1400   ALKPHOS 135 (H) 12/02/2022 1400   BILITOT 0.8 12/02/2022 1400   GFRNONAA >60 12/03/2022 0347   GFRAA 63 01/12/2019 0852   Lipase     Component Value Date/Time   LIPASE 34 12/02/2022 1400    Studies/Results: CT Abdomen Pelvis W Contrast  Result Date: 12/02/2022 CLINICAL DATA:  Diverticulitis  EXAM: CT ABDOMEN AND PELVIS WITH CONTRAST TECHNIQUE: Multidetector CT imaging of the abdomen and pelvis was performed using the standard protocol following bolus administration of intravenous contrast. RADIATION DOSE REDUCTION: This exam was performed according to the departmental dose-optimization program which includes automated exposure control, adjustment of the mA and/or kV according to patient size and/or use of iterative reconstruction technique. CONTRAST:  119m OMNIPAQUE IOHEXOL 300 MG/ML  SOLN COMPARISON:  CT abdomen and pelvis 11/08/2022 FINDINGS: Lower chest: There are chronic appearing interstitial opacities peripherally in the lung bases, unchanged. Hepatobiliary: No focal liver abnormality is seen. No gallstones, gallbladder wall thickening, or biliary dilatation. Pancreas: Unremarkable. No pancreatic ductal dilatation or surrounding inflammatory changes. Spleen: Normal in size without focal abnormality. Adrenals/Urinary Tract: Adrenal glands are unremarkable. There is no hydronephrosis or perinephric fat stranding. 16 mm left renal cyst is unchanged. Bladder is unremarkable. Stomach/Bowel: There are few dilated jejunal loops in the left upper quadrant measuring up to 4.1 cm in diameter. Transition point is seen in the central abdomen image 2/46 where there is caliber change of small bowel with some surrounding inflammatory stranding. Distal small bowel is  nondilated. Percutaneous drainage catheter has been removed in the interval. No recurrent fluid collection identified. Annular mass in the ascending colon is unchanged. There is diffuse colonic diverticulosis without evidence for acute diverticulitis. The appendix is within normal limits. The stomach is within normal limits. Vascular/Lymphatic: Aortic atherosclerosis. No enlarged abdominal or pelvic lymph nodes. Reproductive: Prostate gland well seen. Other: There are small fat containing bilateral inguinal hernias. There is no ascites. There is  mild presacral edema which is nonspecific and unchanged. Musculoskeletal: No acute or significant osseous findings. IMPRESSION: 1. Small-bowel obstruction with transition point in the central abdomen. There is some surrounding inflammatory stranding. 2. Colonic diverticulosis without evidence for acute diverticulitis. 3. 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 4. Stable presacral edema. 5. Left Bosniak I benign renal cyst measuring 1.6 cm. No follow-up imaging is recommended. JACR 2018 Feb; 264-273, Management of the Incidental Renal Mass on CT, [employer] 2021; 814-848, Bosniak Classification of Cystic Renal Masses, Version 2019. Aortic Atherosclerosis (ICD10-I70.0). Electronically Signed   By: Ronney Asters M.D.   On: 12/02/2022 16:12    Anti-infectives: Anti-infectives (From admission, onward)    None        Assessment/Plan SBO - Patient with recent admission for diverticulitis in Jan 2024. During that admission he underwent IR drain placement. There was concern that the drain migrated through SB lumen and the drain  was removed 1/22. Question if SBO is 2/2 to this.  - Symptomatically improving. Continue SBO protocol orally. Will follow up on films. Okay for small sips of clears from the floor. If he has any abdominal pain, distension, n/v please make npo.  - Hopefully patient will continue to improve with conservative management.  He if he fails conservative management he would likely require exploratory laparotomy during admission.  - If improves with conservative management, would recommend f/u as scheduled with Dr. Collene Mares for colonoscopy (on 12/21/22 per notes) to evaluate recent bout of diverticulitis along with mass in ascending colon. He is already scheduled to see Dr. Johney Maine in the office after the colonoscopy.   FEN - Okay for small sips of clears, IVF per [facility] VTE - SCDs, Lovenox ID - None  I reviewed nursing notes, hospitalist notes, last 24 h vitals and pain scores, last 48 h  intake and output, last 24 h labs and trends, and last 24 h imaging results.   LOS: 1 day    Jillyn Ledger , [facility] Surgery 12/03/2022, 8:24 AM Please see Amion for pager number during day hours 7:00am-4:30pm

## 2022-12-04 DIAGNOSIS — K56609 Unspecified intestinal obstruction, unspecified as to partial versus complete obstruction: Secondary | ICD-10-CM | POA: Diagnosis not present

## 2022-12-04 LAB — BASIC METABOLIC PANEL
Anion gap: 8 (ref 5–15)
BUN: 12 mg/dL (ref 8–23)
CO2: 21 mmol/L — ABNORMAL LOW (ref 22–32)
Calcium: 8.2 mg/dL — ABNORMAL LOW (ref 8.9–10.3)
Chloride: 108 mmol/L (ref 98–111)
Creatinine, Ser: 1 mg/dL (ref 0.61–1.24)
GFR, Estimated: >60 mL/min (ref >60)
Glucose, Bld: 94 mg/dL (ref 70–99)
Potassium: 4.1 mmol/L (ref 3.5–5.1)
Sodium: 137 mmol/L (ref 135–145)

## 2022-12-04 LAB — CBC
HCT: 32.5 % — ABNORMAL LOW (ref 39.0–52.0)
Hemoglobin: 9.9 g/dL — ABNORMAL LOW (ref 13.0–17.0)
MCH: 26.2 pg (ref 26.0–34.0)
MCHC: 30.5 g/dL (ref 30.0–36.0)
MCV: 86 fL (ref 80.0–100.0)
Platelets: 244 10*3/uL (ref 150–400)
RBC: 3.78 MIL/uL — ABNORMAL LOW (ref 4.22–5.81)
RDW: 16.6 % — ABNORMAL HIGH (ref 11.5–15.5)
WBC: 7.2 10*3/uL (ref 4.0–10.5)
nRBC: 0 % (ref 0.0–0.2)

## 2022-12-04 LAB — GLUCOSE, CAPILLARY
Glucose-Capillary: 107 mg/dL — ABNORMAL HIGH (ref 70–99)
Glucose-Capillary: 111 mg/dL — ABNORMAL HIGH (ref 70–99)
Glucose-Capillary: 91 mg/dL (ref 70–99)

## 2022-12-04 NOTE — Progress Notes (Signed)
Subjective: CC: Denies any abdominal pain this morning. Last PRN pain medication was at 1512 yesterday. Tolerating cld without n/v. Passing flatus. BM yesterday. Contrast in colon on xray.   Afebrile, no tachycardia or hypotension. WBC wnl.   Objective: Vital signs in last 24 hours: Temp:  [97.4 F (36.3 C)-98 F (36.7 C)] 98 F (36.7 C) (02/16 0609) Pulse Rate:  [51-76] 51 (02/16 0609) Resp:  [14-18] 18 (02/16 0609) BP: (110-130)/(60-81) 113/60 (02/16 0609) SpO2:  [97 %-100 %] 98 % (02/16 0609) Last BM Date : 12/03/22  Intake/Output from previous day: 02/15 0701 - 02/16 0700 In: 2937.1 [P.O.:480; I.V.:2457.1] Out: 700 [Urine:700] Intake/Output this shift: Total I/O In: 100 [P.O.:100] Out: -   PE: Gen:  Alert, NAD, pleasant Abd: Soft, ND, NT, +BS  Lab Results:  Recent Labs    12/03/22 0347 12/04/22 0402  WBC 6.2 7.2  HGB 10.3* 9.9*  HCT 33.6* 32.5*  PLT 228 244    BMET Recent Labs    12/03/22 0347 12/04/22 0402  NA 133* 137  K 4.2 4.1  CL 107 108  CO2 19* 21*  GLUCOSE 93 94  BUN 12 12  CREATININE 0.96 1.00  CALCIUM 8.0* 8.2*    PT/INR No results for input(s): "LABPROT", "INR" in the last 72 hours. CMP     Component Value Date/Time   NA 137 12/04/2022 0402   NA 138 01/12/2019 0852   K 4.1 12/04/2022 0402   CL 108 12/04/2022 0402   CO2 21 (L) 12/04/2022 0402   GLUCOSE 94 12/04/2022 0402   BUN 12 12/04/2022 0402   BUN 20 01/12/2019 0852   CREATININE 1.00 12/04/2022 0402   CALCIUM 8.2 (L) 12/04/2022 0402   PROT 6.8 12/02/2022 1400   ALBUMIN 2.9 (L) 12/02/2022 1400   AST 23 12/02/2022 1400   ALT 15 12/02/2022 1400   ALKPHOS 135 (H) 12/02/2022 1400   BILITOT 0.8 12/02/2022 1400   GFRNONAA >60 12/04/2022 0402   GFRAA 63 01/12/2019 0852   Lipase     Component Value Date/Time   LIPASE 34 12/02/2022 1400    Studies/Results: DG Abd Portable 1V-Small Bowel Obstruction Protocol-initial, 8 hr delay  Result Date: 12/03/2022 CLINICAL  DATA:  Small bowel obstruction EXAM: PORTABLE ABDOMEN - 1 VIEW COMPARISON:  CT 12/02/2022 FINDINGS: Contrast material is demonstrated throughout the colon suggesting no evidence of complete obstruction. Diffusely gas-filled small bowel without abnormal distention. Changes may represent ileus or partial obstruction. Degenerative changes in the spine and hips. Surgical clips in the pelvis. Opaque medication suggested in the left upper quadrant. IMPRESSION: Contrast material is demonstrated throughout the colon suggesting no evidence of complete obstruction. Electronically Signed   By: Lucienne Capers M.D.   On: 12/03/2022 17:47   CT Abdomen Pelvis W Contrast  Result Date: 12/02/2022 CLINICAL DATA:  Diverticulitis EXAM: CT ABDOMEN AND PELVIS WITH CONTRAST TECHNIQUE: Multidetector CT imaging of the abdomen and pelvis was performed using the standard protocol following bolus administration of intravenous contrast. RADIATION DOSE REDUCTION: This exam was performed according to the departmental dose-optimization program which includes automated exposure control, adjustment of the mA and/or kV according to patient size and/or use of iterative reconstruction technique. CONTRAST:  175m OMNIPAQUE IOHEXOL 300 MG/ML  SOLN COMPARISON:  CT abdomen and pelvis 11/08/2022 FINDINGS: Lower chest: There are chronic appearing interstitial opacities peripherally in the lung bases, unchanged. Hepatobiliary: No focal liver abnormality is seen. No gallstones, gallbladder wall thickening, or biliary dilatation. Pancreas: Unremarkable.  No pancreatic ductal dilatation or surrounding inflammatory changes. Spleen: Normal in size without focal abnormality. Adrenals/Urinary Tract: Adrenal glands are unremarkable. There is no hydronephrosis or perinephric fat stranding. 16 mm left renal cyst is unchanged. Bladder is unremarkable. Stomach/Bowel: There are few dilated jejunal loops in the left upper quadrant measuring up to 4.1 cm in diameter.  Transition point is seen in the central abdomen image 2/46 where there is caliber change of small bowel with some surrounding inflammatory stranding. Distal small bowel is nondilated. Percutaneous drainage catheter has been removed in the interval. No recurrent fluid collection identified. Annular mass in the ascending colon is unchanged. There is diffuse colonic diverticulosis without evidence for acute diverticulitis. The appendix is within normal limits. The stomach is within normal limits. Vascular/Lymphatic: Aortic atherosclerosis. No enlarged abdominal or pelvic lymph nodes. Reproductive: Prostate gland well seen. Other: There are small fat containing bilateral inguinal hernias. There is no ascites. There is mild presacral edema which is nonspecific and unchanged. Musculoskeletal: No acute or significant osseous findings. IMPRESSION: 1. Small-bowel obstruction with transition point in the central abdomen. There is some surrounding inflammatory stranding. 2. Colonic diverticulosis without evidence for acute diverticulitis. 3. 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 4. Stable presacral edema. 5. Left Bosniak I benign renal cyst measuring 1.6 cm. No follow-up imaging is recommended. JACR 2018 Feb; 264-273, Management of the Incidental Renal Mass on CT, [employer] 2021; 814-848, Bosniak Classification of Cystic Renal Masses, Version 2019. Aortic Atherosclerosis (ICD10-I70.0). Electronically Signed   By: Ronney Asters M.D.   On: 12/02/2022 16:12    Anti-infectives: Anti-infectives (From admission, onward)    None        Assessment/Plan SBO Patient with recent admission for diverticulitis in Jan 2024. During that admission he underwent IR drain placement. There was concern that the drain migrated through SB lumen and the drain  was removed 1/22. Question if SBO is 2/2 to this.  - Clinically and radiographically resolving/resolved. He has resolution of symptoms, return of bowel function,  tolerating cld without n/v and contrast in colon on 8 hour delay films. Adv to regular diet. If tolerates diet advancement, okay for discharge from our standpoint. I have messaged [facility] to let them know. - Recommend f/u as scheduled with Dr. Collene Mares for colonoscopy (on 12/21/22 per notes) to evaluate recent bout of diverticulitis along with mass in ascending colon. He is already scheduled to see Dr. Johney Maine in the office after the colonoscopy.   FEN - Reg diet. IVF per [facility] VTE - SCDs, Lovenox ID - None  I reviewed nursing notes, hospitalist notes, last 24 h vitals and pain scores, last 48 h intake and output, last 24 h labs and trends, and last 24 h imaging results.   LOS: 2 days    Jillyn Ledger , [facility] 12/04/2022, 9:43 AM Please see Amion for pager number during day hours 7:00am-4:30pm

## 2022-12-04 NOTE — Discharge Summary (Signed)
Physician Discharge Summary  Xzaviar Canel [id number] DOB: 1941/04/10 DOA: 12/02/2022  PCP: Aletha Halim., PA-C  Admit date: 12/02/2022 Discharge date: 12/04/2022 Discharging to: home Recommendations for Outpatient Follow-up:  F/u on colon mass  Consults:  General surgery Procedures:  none   Discharge Diagnoses:   Principal Problem:   SBO (small bowel obstruction) ([place]) Active Problems:  Chronic atrial fibrillation (HCC)   Chronic kidney disease, stage 3a ([place])   Diabetes mellitus without complication ([place])   Hypertension   Hyperlipidemia   GERD (gastroesophageal reflux disease)   Colonic mass   Chronic anemia   Hypotension   Malnutrition of moderate degree    Brief hospital course: Ian Estes, an 82 year old male, had a significant medical history including stage 3 CKD with associated anemia, atrial fibrillation managed with Eliquis, hypertension, hyperlipidemia, and a recent hospitalization for acute diverticulitis and discovery of an ascending colon mass. He was admitted to the hospital due to recurrent abdominal pain, which was diagnosed as a small bowel obstruction. Both the patient and his son provided the medical history at the bedside in the ER. They indicated that he had been fairly stable, having recently completed a 7-day course of Augmentin earlier in the month. While he had been improving and regaining strength with improved eating habits, he began experiencing epigastric abdominal pain without radiation, accompanied by nausea but no vomiting, over the past 2 to 3 days. Despite having a normal bowel movement about 2 days prior, he had since lost his appetite and consumed very little food. He denied experiencing fevers, chills, chest pain, or dizziness, and there was no report of blood in the stool. His presentation to the ER was prompted by the persistence of his abdominal pain.  Principal Problem:   Small bowel obstruction (SBO) ([place]): SBO per CT scan,  transition point in the central abdomen. - has resolved   Active Problems:   Moderate malnutrition - as evidenced by moderate fat and muscle loss  - 6.6% wt loss in 2 wks- dietician consult appreciated - Ensure started   GERD (gastroesophageal reflux disease) - cont PPI   Colonic mass - CT > Stable annular mass in the ascending colon.  -Outpatient colonoscopy is already scheduled per patient   History of diverticulitis of intestine with perforation and abscess: Recently completed treatment with IR drain removal in the past month.   Chronic atrial fibrillation (HCC):CHA2DS2-VASc Score 4 Patient appears to be in sinus rhythm Eliquis    Diabetes mellitus without complication ([place]): Hypertension: Hyperlipidemia             Discharge Instructions  Discharge Instructions     Diet - low sodium heart healthy   Complete by: As directed    Increase activity slowly   Complete by: As directed       Allergies as of 12/04/2022   No Known Allergies      Medication List     STOP taking these medications    saccharomyces boulardii 250 MG capsule Commonly known as: FLORASTOR       TAKE these medications    acetaminophen 325 MG tablet Commonly known as: TYLENOL Take 2 tablets (650 mg total) by mouth every 6 (six) hours as needed for mild pain, fever or headache. What changed: Another medication with the same name was removed. Continue taking this medication, and follow the directions you see here.   allopurinol 100 MG tablet Commonly known as: ZYLOPRIM Take 100 mg by mouth daily.   apixaban 5 MG Tabs  tablet Commonly known as: Eliquis Take 1 tablet (5 mg total) by mouth 2 (two) times daily.   atorvastatin 20 MG tablet Commonly known as: LIPITOR Take 20 mg by mouth daily.   clonazePAM 0.5 MG tablet Commonly known as: KLONOPIN Take 0.5 mg by mouth daily as needed for anxiety (or rest).   Ensure Take 237 mLs by mouth 2 (two) times daily between meals.    Ensure Max Protein Liqd Take 330 mLs (11 oz total) by mouth 3 (three) times daily.   folic acid 1 MG tablet Commonly known as: FOLVITE Take 1 tablet (1 mg total) by mouth daily.   furosemide 20 MG tablet Commonly known as: Lasix Take 1 tablet (20 mg total) by mouth daily as needed (for weight gain of >3 lbs in 1 day or >5 lbs in 1 week or for edema in legs).   metoprolol succinate 25 MG 24 hr tablet Commonly known as: TOPROL-XL Take 0.5 tablets (12.5 mg total) by mouth daily.   ondansetron 4 MG tablet Commonly known as: ZOFRAN Take 4 mg by mouth every 8 (eight) hours as needed for nausea or vomiting.   pantoprazole 40 MG tablet Commonly known as: PROTONIX Take 40 mg by mouth daily.   Pedialyte Soln Take 200 mLs by mouth 5 (five) times daily.   traMADol 50 MG tablet Commonly known as: Ultram Take 1 tablet (50 mg total) by mouth every 8 (eight) hours as needed for moderate pain or severe pain.        Follow-up Information     Michael Boston, MD Follow up on 12/23/2022.   Specialties: General Surgery, Colon and Rectal Surgery Why: 130pm., Bring a copy of your photo ID & insurance card, Arrive 30 minutes prior to your appt for paperwork Contact information: [address] [place] [address] [phone]                    The results of significant diagnostics from this hospitalization (including imaging, microbiology, ancillary and laboratory) are listed below for reference.    DG Abd Portable 1V-Small Bowel Obstruction Protocol-initial, 8 hr delay  Result Date: 12/03/2022 CLINICAL DATA:  Small bowel obstruction EXAM: PORTABLE ABDOMEN - 1 VIEW COMPARISON:  CT 12/02/2022 FINDINGS: Contrast material is demonstrated throughout the colon suggesting no evidence of complete obstruction. Diffusely gas-filled small bowel without abnormal distention. Changes may represent ileus or partial obstruction. Degenerative changes in the spine and hips. Surgical  clips in the pelvis. Opaque medication suggested in the left upper quadrant. IMPRESSION: Contrast material is demonstrated throughout the colon suggesting no evidence of complete obstruction. Electronically Signed   By: Lucienne Capers M.D.   On: 12/03/2022 17:47   CT Abdomen Pelvis W Contrast  Result Date: 12/02/2022 CLINICAL DATA:  Diverticulitis EXAM: CT ABDOMEN AND PELVIS WITH CONTRAST TECHNIQUE: Multidetector CT imaging of the abdomen and pelvis was performed using the standard protocol following bolus administration of intravenous contrast. RADIATION DOSE REDUCTION: This exam was performed according to the departmental dose-optimization program which includes automated exposure control, adjustment of the mA and/or kV according to patient size and/or use of iterative reconstruction technique. CONTRAST:  168m OMNIPAQUE IOHEXOL 300 MG/ML  SOLN COMPARISON:  CT abdomen and pelvis 11/08/2022 FINDINGS: Lower chest: There are chronic appearing interstitial opacities peripherally in the lung bases, unchanged. Hepatobiliary: No focal liver abnormality is seen. No gallstones, gallbladder wall thickening, or biliary dilatation. Pancreas: Unremarkable. No pancreatic ductal dilatation or surrounding inflammatory changes. Spleen: Normal  in size without focal abnormality. Adrenals/Urinary Tract: Adrenal glands are unremarkable. There is no hydronephrosis or perinephric fat stranding. 16 mm left renal cyst is unchanged. Bladder is unremarkable. Stomach/Bowel: There are few dilated jejunal loops in the left upper quadrant measuring up to 4.1 cm in diameter. Transition point is seen in the central abdomen image 2/46 where there is caliber change of small bowel with some surrounding inflammatory stranding. Distal small bowel is nondilated. Percutaneous drainage catheter has been removed in the interval. No recurrent fluid collection identified. Annular mass in the ascending colon is unchanged. There is diffuse colonic  diverticulosis without evidence for acute diverticulitis. The appendix is within normal limits. The stomach is within normal limits. Vascular/Lymphatic: Aortic atherosclerosis. No enlarged abdominal or pelvic lymph nodes. Reproductive: Prostate gland well seen. Other: There are small fat containing bilateral inguinal hernias. There is no ascites. There is mild presacral edema which is nonspecific and unchanged. Musculoskeletal: No acute or significant osseous findings. IMPRESSION: 1. Small-bowel obstruction with transition point in the central abdomen. There is some surrounding inflammatory stranding. 2. Colonic diverticulosis without evidence for acute diverticulitis. 3. 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 4. Stable presacral edema. 5. Left Bosniak I benign renal cyst measuring 1.6 cm. No follow-up imaging is recommended. JACR 2018 Feb; 264-273, Management of the Incidental Renal Mass on CT, [employer] 2021; 814-848, Bosniak Classification of Cystic Renal Masses, Version 2019. Aortic Atherosclerosis (ICD10-I70.0). Electronically Signed   By: Ronney Asters M.D.   On: 12/02/2022 16:12   CT ABDOMEN PELVIS W CONTRAST  Result Date: 11/08/2022 CLINICAL DATA:  Generalized weakness for 3 days. Intra-abdominal abscess. EXAM: CT ABDOMEN AND PELVIS WITH CONTRAST TECHNIQUE: Multidetector CT imaging of the abdomen and pelvis was performed using the standard protocol following bolus administration of intravenous contrast. RADIATION DOSE REDUCTION: This exam was performed according to the departmental dose-optimization program which includes automated exposure control, adjustment of the mA and/or kV according to patient size and/or use of iterative reconstruction technique. CONTRAST:  158m OMNIPAQUE IOHEXOL 300 MG/ML  SOLN COMPARISON:  10/25/2022 FINDINGS: Lower chest: Extensive coronary atherosclerosis. Large heart size. Subpleural reticulation with scar-like appearance in the lower lobes. No acute finding mild  linear Hepatobiliary: Mild liver surface lobulation without convincing cirrhosis no evidence of biliary obstruction or stone. Pancreas: Generalized atrophy Spleen: Unremarkable. Adrenals/Urinary Tract: Negative adrenals. No hydronephrosis or stone. Bilateral renal atrophy. 18 mm interpolar left renal cyst. No follow-up recommended. Unremarkable bladder. Stomach/Bowel: Percutaneous drainage catheter has decompressed the previously seen collection and the tip now cannulates in adjacent small bowel loop. Residual collection measures 2.5 cm. Suspect this is a collapsed diverticulum. Additional large diverticulum suspected from a central small bowel loops, containing fluid and debris and measuring up to 5 cm. There are colonic diverticula swell, including a the sigmoid diverticulum which shows active inflammation, see markings on coronal reformats. Annular masslike appearance at the ascending colon just above the ileocecal valve. No convincing adenopathy no bowel obstruction Vascular/Lymphatic: Extensive atheromatous calcification of the aorta and branch vessels. No mass or adenopathy. Reproductive:No pathologic findings. Other: No ascites or pneumoperitoneum. Musculoskeletal: No acute abnormalities. Direct message sent in epic chat. IMPRESSION: 1. 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 2. Active sigmoid diverticulitis with mild inflammation, new from 10/25/2022. 3. Percutaneous drainage of small bowel diverticular collection which now measures 2.5 cm. The catheter has cannulated the small bowel lumen through the diverticulum neck. 4. 5 cm central abdominal collection, likely a small bowel diverticulum. No visible superimposed inflammation.  Electronically Signed   By: Jorje Guild M.D.   On: 11/08/2022 06:10   DG Chest Port 1 View  Result Date: 11/08/2022 CLINICAL DATA:  Generalized weakness for 3 days. Nausea with decreased appetite. EXAM: PORTABLE CHEST 1 VIEW COMPARISON:   02/22/2019 FINDINGS: The heart is enlarged and the mediastinal contour is within normal limits. There is atherosclerotic calcification of the aorta. No consolidation, effusion, or pneumothorax. No acute osseous abnormality. IMPRESSION: 1. No active disease. 2. Cardiomegaly. Electronically Signed   By: Brett Fairy M.D.   On: 11/08/2022 02:41   Labs:   Basic Metabolic Panel: Recent Labs  Lab 12/02/22 1400 12/02/22 1818 12/03/22 0347 12/04/22 0402  NA 135  --  133* 137  K 3.9  --  4.2 4.1  CL 105  --  107 108  CO2 20*  --  19* 21*  GLUCOSE 103*  --  93 94  BUN 12  --  12 12  CREATININE 1.00 0.93 0.96 1.00  CALCIUM 8.5*  --  8.0* 8.2*     CBC: Recent Labs  Lab 12/02/22 1400 12/02/22 1818 12/03/22 0347 12/04/22 0402  WBC 8.0 7.6 6.2 7.2  NEUTROABS 5.3  --   --   --   HGB 11.2* 9.7* 10.3* 9.9*  HCT 38.6* 31.1* 33.6* 32.5*  MCV 89.8 85.4 87.3 86.0  PLT 282 252 228 244         SIGNED:   Debbe Odea, MD  [facility] 12/04/2022, 10:08 AM

## 2022-12-08 DIAGNOSIS — Z792 Long term (current) use of antibiotics: Secondary | ICD-10-CM | POA: Diagnosis not present

## 2022-12-08 DIAGNOSIS — F419 Anxiety disorder, unspecified: Secondary | ICD-10-CM | POA: Diagnosis not present

## 2022-12-08 DIAGNOSIS — K219 Gastro-esophageal reflux disease without esophagitis: Secondary | ICD-10-CM | POA: Diagnosis not present

## 2022-12-08 DIAGNOSIS — K6389 Other specified diseases of intestine: Secondary | ICD-10-CM | POA: Diagnosis not present

## 2022-12-08 DIAGNOSIS — Z8546 Personal history of malignant neoplasm of prostate: Secondary | ICD-10-CM | POA: Diagnosis not present

## 2022-12-08 DIAGNOSIS — E1122 Type 2 diabetes mellitus with diabetic chronic kidney disease: Secondary | ICD-10-CM | POA: Diagnosis not present

## 2022-12-08 DIAGNOSIS — I480 Paroxysmal atrial fibrillation: Secondary | ICD-10-CM | POA: Diagnosis not present

## 2022-12-08 DIAGNOSIS — I130 Hypertensive heart and chronic kidney disease with heart failure and stage 1 through stage 4 chronic kidney disease, or unspecified chronic kidney disease: Secondary | ICD-10-CM | POA: Diagnosis not present

## 2022-12-08 DIAGNOSIS — Z9181 History of falling: Secondary | ICD-10-CM | POA: Diagnosis not present

## 2022-12-08 DIAGNOSIS — G8929 Other chronic pain: Secondary | ICD-10-CM | POA: Diagnosis not present

## 2022-12-08 DIAGNOSIS — E7800 Pure hypercholesterolemia, unspecified: Secondary | ICD-10-CM | POA: Diagnosis not present

## 2022-12-08 DIAGNOSIS — E669 Obesity, unspecified: Secondary | ICD-10-CM | POA: Diagnosis not present

## 2022-12-08 DIAGNOSIS — D72829 Elevated white blood cell count, unspecified: Secondary | ICD-10-CM | POA: Diagnosis not present

## 2022-12-08 DIAGNOSIS — M159 Polyosteoarthritis, unspecified: Secondary | ICD-10-CM | POA: Diagnosis not present

## 2022-12-08 DIAGNOSIS — M109 Gout, unspecified: Secondary | ICD-10-CM | POA: Diagnosis not present

## 2022-12-08 DIAGNOSIS — Z87891 Personal history of nicotine dependence: Secondary | ICD-10-CM | POA: Diagnosis not present

## 2022-12-08 DIAGNOSIS — K5780 Diverticulitis of intestine, part unspecified, with perforation and abscess without bleeding: Secondary | ICD-10-CM | POA: Diagnosis not present

## 2022-12-08 DIAGNOSIS — N1831 Chronic kidney disease, stage 3a: Secondary | ICD-10-CM | POA: Diagnosis not present

## 2022-12-08 DIAGNOSIS — I5032 Chronic diastolic (congestive) heart failure: Secondary | ICD-10-CM | POA: Diagnosis not present

## 2022-12-08 DIAGNOSIS — Z85828 Personal history of other malignant neoplasm of skin: Secondary | ICD-10-CM | POA: Diagnosis not present

## 2022-12-08 DIAGNOSIS — Z8701 Personal history of pneumonia (recurrent): Secondary | ICD-10-CM | POA: Diagnosis not present

## 2022-12-09 ENCOUNTER — Emergency Department (HOSPITAL_COMMUNITY): Payer: Medicare HMO

## 2022-12-09 ENCOUNTER — Encounter (HOSPITAL_COMMUNITY): Payer: Self-pay

## 2022-12-09 ENCOUNTER — Inpatient Hospital Stay (HOSPITAL_COMMUNITY): Payer: Medicare HMO

## 2022-12-09 ENCOUNTER — Inpatient Hospital Stay (HOSPITAL_COMMUNITY)
Admission: EM | Admit: 2022-12-09 | Discharge: 2022-12-24 | DRG: 330 | Disposition: A | Discharge status: Skilled Nursing Facility | Payer: Medicare HMO | Attending: Internal Medicine | Admitting: Internal Medicine

## 2022-12-09 ENCOUNTER — Other Ambulatory Visit: Payer: Self-pay

## 2022-12-09 DIAGNOSIS — E7800 Pure hypercholesterolemia, unspecified: Secondary | ICD-10-CM | POA: Diagnosis present

## 2022-12-09 DIAGNOSIS — E1169 Type 2 diabetes mellitus with other specified complication: Secondary | ICD-10-CM | POA: Diagnosis not present

## 2022-12-09 DIAGNOSIS — N189 Chronic kidney disease, unspecified: Secondary | ICD-10-CM | POA: Diagnosis not present

## 2022-12-09 DIAGNOSIS — K56609 Unspecified intestinal obstruction, unspecified as to partial versus complete obstruction: Secondary | ICD-10-CM

## 2022-12-09 DIAGNOSIS — K5651 Intestinal adhesions [bands], with partial obstruction: Principal | ICD-10-CM | POA: Diagnosis present

## 2022-12-09 DIAGNOSIS — E869 Volume depletion, unspecified: Secondary | ICD-10-CM | POA: Diagnosis not present

## 2022-12-09 DIAGNOSIS — I10 Essential (primary) hypertension: Secondary | ICD-10-CM | POA: Diagnosis present

## 2022-12-09 DIAGNOSIS — Z743 Need for continuous supervision: Secondary | ICD-10-CM | POA: Diagnosis not present

## 2022-12-09 DIAGNOSIS — E871 Hypo-osmolality and hyponatremia: Secondary | ICD-10-CM | POA: Diagnosis not present

## 2022-12-09 DIAGNOSIS — E1122 Type 2 diabetes mellitus with diabetic chronic kidney disease: Secondary | ICD-10-CM | POA: Diagnosis not present

## 2022-12-09 DIAGNOSIS — D62 Acute posthemorrhagic anemia: Secondary | ICD-10-CM | POA: Diagnosis not present

## 2022-12-09 DIAGNOSIS — Z8701 Personal history of pneumonia (recurrent): Secondary | ICD-10-CM | POA: Diagnosis not present

## 2022-12-09 DIAGNOSIS — E7849 Other hyperlipidemia: Secondary | ICD-10-CM | POA: Diagnosis not present

## 2022-12-09 DIAGNOSIS — Y92230 Patient room in hospital as the place of occurrence of the external cause: Secondary | ICD-10-CM | POA: Diagnosis not present

## 2022-12-09 DIAGNOSIS — D631 Anemia in chronic kidney disease: Secondary | ICD-10-CM | POA: Diagnosis not present

## 2022-12-09 DIAGNOSIS — W1830XA Fall on same level, unspecified, initial encounter: Secondary | ICD-10-CM | POA: Diagnosis not present

## 2022-12-09 DIAGNOSIS — I129 Hypertensive chronic kidney disease with stage 1 through stage 4 chronic kidney disease, or unspecified chronic kidney disease: Secondary | ICD-10-CM | POA: Diagnosis present

## 2022-12-09 DIAGNOSIS — I482 Chronic atrial fibrillation, unspecified: Secondary | ICD-10-CM | POA: Diagnosis present

## 2022-12-09 DIAGNOSIS — N1831 Chronic kidney disease, stage 3a: Secondary | ICD-10-CM | POA: Diagnosis present

## 2022-12-09 DIAGNOSIS — K6389 Other specified diseases of intestine: Secondary | ICD-10-CM | POA: Diagnosis present

## 2022-12-09 DIAGNOSIS — R109 Unspecified abdominal pain: Secondary | ICD-10-CM | POA: Diagnosis not present

## 2022-12-09 DIAGNOSIS — C182 Malignant neoplasm of ascending colon: Secondary | ICD-10-CM | POA: Diagnosis not present

## 2022-12-09 DIAGNOSIS — Z5331 Laparoscopic surgical procedure converted to open procedure: Secondary | ICD-10-CM

## 2022-12-09 DIAGNOSIS — D124 Benign neoplasm of descending colon: Secondary | ICD-10-CM | POA: Diagnosis present

## 2022-12-09 DIAGNOSIS — Z8546 Personal history of malignant neoplasm of prostate: Secondary | ICD-10-CM

## 2022-12-09 DIAGNOSIS — Z7401 Bed confinement status: Secondary | ICD-10-CM | POA: Diagnosis not present

## 2022-12-09 DIAGNOSIS — Z85828 Personal history of other malignant neoplasm of skin: Secondary | ICD-10-CM | POA: Diagnosis not present

## 2022-12-09 DIAGNOSIS — Z9181 History of falling: Secondary | ICD-10-CM

## 2022-12-09 DIAGNOSIS — E875 Hyperkalemia: Secondary | ICD-10-CM | POA: Diagnosis not present

## 2022-12-09 DIAGNOSIS — Z87891 Personal history of nicotine dependence: Secondary | ICD-10-CM

## 2022-12-09 DIAGNOSIS — E440 Moderate protein-calorie malnutrition: Secondary | ICD-10-CM | POA: Diagnosis not present

## 2022-12-09 DIAGNOSIS — Z66 Do not resuscitate: Secondary | ICD-10-CM | POA: Diagnosis present

## 2022-12-09 DIAGNOSIS — Z7901 Long term (current) use of anticoagulants: Secondary | ICD-10-CM | POA: Diagnosis not present

## 2022-12-09 DIAGNOSIS — Q438 Other specified congenital malformations of intestine: Secondary | ICD-10-CM | POA: Diagnosis not present

## 2022-12-09 DIAGNOSIS — Z4659 Encounter for fitting and adjustment of other gastrointestinal appliance and device: Secondary | ICD-10-CM | POA: Diagnosis not present

## 2022-12-09 DIAGNOSIS — K625 Hemorrhage of anus and rectum: Secondary | ICD-10-CM | POA: Diagnosis not present

## 2022-12-09 DIAGNOSIS — Z5320 Procedure and treatment not carried out because of patient's decision for unspecified reasons: Secondary | ICD-10-CM | POA: Diagnosis not present

## 2022-12-09 DIAGNOSIS — J90 Pleural effusion, not elsewhere classified: Secondary | ICD-10-CM | POA: Diagnosis not present

## 2022-12-09 DIAGNOSIS — N281 Cyst of kidney, acquired: Secondary | ICD-10-CM | POA: Diagnosis not present

## 2022-12-09 DIAGNOSIS — I4891 Unspecified atrial fibrillation: Secondary | ICD-10-CM | POA: Diagnosis not present

## 2022-12-09 DIAGNOSIS — Z8719 Personal history of other diseases of the digestive system: Secondary | ICD-10-CM

## 2022-12-09 DIAGNOSIS — N179 Acute kidney failure, unspecified: Secondary | ICD-10-CM | POA: Diagnosis not present

## 2022-12-09 DIAGNOSIS — R531 Weakness: Secondary | ICD-10-CM | POA: Diagnosis not present

## 2022-12-09 DIAGNOSIS — R933 Abnormal findings on diagnostic imaging of other parts of digestive tract: Secondary | ICD-10-CM | POA: Diagnosis not present

## 2022-12-09 DIAGNOSIS — Z9079 Acquired absence of other genital organ(s): Secondary | ICD-10-CM

## 2022-12-09 DIAGNOSIS — C189 Malignant neoplasm of colon, unspecified: Secondary | ICD-10-CM | POA: Diagnosis not present

## 2022-12-09 DIAGNOSIS — J479 Bronchiectasis, uncomplicated: Secondary | ICD-10-CM | POA: Diagnosis not present

## 2022-12-09 DIAGNOSIS — I700 Atherosclerosis of aorta: Secondary | ICD-10-CM | POA: Diagnosis not present

## 2022-12-09 DIAGNOSIS — D500 Iron deficiency anemia secondary to blood loss (chronic): Secondary | ICD-10-CM | POA: Diagnosis not present

## 2022-12-09 DIAGNOSIS — K56600 Partial intestinal obstruction, unspecified as to cause: Secondary | ICD-10-CM | POA: Diagnosis not present

## 2022-12-09 DIAGNOSIS — M7989 Other specified soft tissue disorders: Secondary | ICD-10-CM | POA: Diagnosis not present

## 2022-12-09 DIAGNOSIS — K5730 Diverticulosis of large intestine without perforation or abscess without bleeding: Secondary | ICD-10-CM | POA: Diagnosis not present

## 2022-12-09 DIAGNOSIS — Z043 Encounter for examination and observation following other accident: Secondary | ICD-10-CM | POA: Diagnosis not present

## 2022-12-09 DIAGNOSIS — D630 Anemia in neoplastic disease: Secondary | ICD-10-CM | POA: Diagnosis not present

## 2022-12-09 DIAGNOSIS — K56699 Other intestinal obstruction unspecified as to partial versus complete obstruction: Secondary | ICD-10-CM | POA: Diagnosis not present

## 2022-12-09 LAB — CBC WITH DIFFERENTIAL/PLATELET
Abs Immature Granulocytes: 0.02 10*3/uL (ref 0.00–0.07)
Basophils Absolute: 0.1 10*3/uL (ref 0.0–0.1)
Basophils Relative: 1 %
Eosinophils Absolute: 0.2 10*3/uL (ref 0.0–0.5)
Eosinophils Relative: 2 %
HCT: 36.7 % — ABNORMAL LOW (ref 39.0–52.0)
Hemoglobin: 11.3 g/dL — ABNORMAL LOW (ref 13.0–17.0)
Immature Granulocytes: 0 %
Lymphocytes Relative: 19 %
Lymphs Abs: 1.7 10*3/uL (ref 0.7–4.0)
MCH: 26.3 pg (ref 26.0–34.0)
MCHC: 30.8 g/dL (ref 30.0–36.0)
MCV: 85.3 fL (ref 80.0–100.0)
Monocytes Absolute: 0.8 10*3/uL (ref 0.1–1.0)
Monocytes Relative: 9 %
Neutro Abs: 6 10*3/uL (ref 1.7–7.7)
Neutrophils Relative %: 69 %
Platelets: 306 10*3/uL (ref 150–400)
RBC: 4.3 MIL/uL (ref 4.22–5.81)
RDW: 16.7 % — ABNORMAL HIGH (ref 11.5–15.5)
WBC: 8.8 10*3/uL (ref 4.0–10.5)
nRBC: 0 % (ref 0.0–0.2)

## 2022-12-09 LAB — COMPREHENSIVE METABOLIC PANEL
ALT: 16 U/L (ref 0–44)
AST: 27 U/L (ref 15–41)
Albumin: 3.1 g/dL — ABNORMAL LOW (ref 3.5–5.0)
Alkaline Phosphatase: 145 U/L — ABNORMAL HIGH (ref 38–126)
Anion gap: 9 (ref 5–15)
BUN: 13 mg/dL (ref 8–23)
CO2: 25 mmol/L (ref 22–32)
Calcium: 8.9 mg/dL (ref 8.9–10.3)
Chloride: 102 mmol/L (ref 98–111)
Creatinine, Ser: 1.11 mg/dL (ref 0.61–1.24)
GFR, Estimated: >60 mL/min (ref >60)
Glucose, Bld: 139 mg/dL — ABNORMAL HIGH (ref 70–99)
Potassium: 4.4 mmol/L (ref 3.5–5.1)
Sodium: 136 mmol/L (ref 135–145)
Total Bilirubin: 0.5 mg/dL (ref 0.3–1.2)
Total Protein: 6.7 g/dL (ref 6.5–8.1)

## 2022-12-09 LAB — LIPASE, BLOOD: Lipase: 36 U/L (ref 11–51)

## 2022-12-09 LAB — BASIC METABOLIC PANEL
Anion gap: 10 (ref 5–15)
BUN: 12 mg/dL (ref 8–23)
CO2: 22 mmol/L (ref 22–32)
Calcium: 8.8 mg/dL — ABNORMAL LOW (ref 8.9–10.3)
Chloride: 102 mmol/L (ref 98–111)
Creatinine, Ser: 1.08 mg/dL (ref 0.61–1.24)
GFR, Estimated: >60 mL/min (ref >60)
Glucose, Bld: 99 mg/dL (ref 70–99)
Potassium: 4.5 mmol/L (ref 3.5–5.1)
Sodium: 134 mmol/L — ABNORMAL LOW (ref 135–145)

## 2022-12-09 LAB — I-STAT CHEM 8, ED
BUN: 14 mg/dL (ref 8–23)
Calcium, Ion: 1.16 mmol/L (ref 1.15–1.40)
Chloride: 100 mmol/L (ref 98–111)
Creatinine, Ser: 1 mg/dL (ref 0.61–1.24)
Glucose, Bld: 136 mg/dL — ABNORMAL HIGH (ref 70–99)
HCT: 38 % — ABNORMAL LOW (ref 39.0–52.0)
Hemoglobin: 12.9 g/dL — ABNORMAL LOW (ref 13.0–17.0)
Potassium: 5.8 mmol/L — ABNORMAL HIGH (ref 3.5–5.1)
Sodium: 134 mmol/L — ABNORMAL LOW (ref 135–145)
TCO2: 27 mmol/L (ref 22–32)

## 2022-12-09 MED ORDER — SODIUM CHLORIDE 0.9 % IV SOLN: INTRAVENOUS | Status: DC

## 2022-12-09 MED ORDER — HYDROMORPHONE HCL 1 MG/ML IJ SOLN
0.5000 mg | INTRAMUSCULAR | Status: AC | PRN
  Administered 2022-12-09 – 2022-12-11 (×3): 0.5 mg via INTRAVENOUS
  Filled 2022-12-09 (×3): qty 0.5

## 2022-12-09 MED ORDER — ONDANSETRON HCL 4 MG/2ML IJ SOLN: 4.0000 mg | Freq: Once | INTRAMUSCULAR | Status: DC

## 2022-12-09 MED ORDER — FUROSEMIDE 10 MG/ML IJ SOLN
40.0000 mg | Freq: Once | INTRAMUSCULAR | Status: DC
  Filled 2022-12-09: qty 4

## 2022-12-09 MED ORDER — FENTANYL CITRATE PF 50 MCG/ML IJ SOSY
50.0000 ug | PREFILLED_SYRINGE | Freq: Once | INTRAMUSCULAR | Status: AC
  Administered 2022-12-09: 50 ug via INTRAVENOUS
  Filled 2022-12-09: qty 1

## 2022-12-09 MED ORDER — IOHEXOL 300 MG/ML  SOLN
100.0000 mL | Freq: Once | INTRAMUSCULAR | Status: AC | PRN
  Administered 2022-12-09: 100 mL via INTRAVENOUS

## 2022-12-09 MED ORDER — DIATRIZOATE MEGLUMINE & SODIUM 66-10 % PO SOLN
90.0000 mL | Freq: Once | ORAL | Status: AC
  Administered 2022-12-09: 90 mL via NASOGASTRIC
  Filled 2022-12-09: qty 90

## 2022-12-09 MED ORDER — LACTATED RINGERS IV BOLUS
1000.0000 mL | Freq: Once | INTRAVENOUS | Status: AC
  Administered 2022-12-09: 1000 mL via INTRAVENOUS

## 2022-12-09 MED ORDER — ONDANSETRON 8 MG PO TBDP
8.0000 mg | ORAL_TABLET | Freq: Once | ORAL | Status: DC
  Administered 2022-12-09: 8 mg via ORAL
  Filled 2022-12-09: qty 1

## 2022-12-09 MED ORDER — ONDANSETRON HCL 4 MG/2ML IJ SOLN
4.0000 mg | Freq: Four times a day (QID) | INTRAMUSCULAR | Status: DC | PRN
  Administered 2022-12-12: 4 mg via INTRAVENOUS
  Filled 2022-12-09 (×2): qty 2

## 2022-12-09 MED ORDER — SODIUM CHLORIDE 0.9 % IV BOLUS
500.0000 mL | Freq: Once | INTRAVENOUS | Status: AC
  Administered 2022-12-09: 500 mL via INTRAVENOUS

## 2022-12-09 MED ORDER — SODIUM CHLORIDE 0.9% FLUSH
3.0000 mL | Freq: Two times a day (BID) | INTRAVENOUS | Status: DC
  Administered 2022-12-09 – 2022-12-23 (×19): 3 mL via INTRAVENOUS

## 2022-12-09 MED ORDER — ONDANSETRON HCL 4 MG PO TABS: 4.0000 mg | ORAL_TABLET | Freq: Four times a day (QID) | ORAL | Status: DC | PRN

## 2022-12-09 NOTE — ED Notes (Signed)
Deatra Canter, [place] notified of pt HR and this RN has requested a different form of pain relief due to this.

## 2022-12-09 NOTE — ED Provider Notes (Cosign Needed Addendum)
[facility] Provider Note   CSN: [id number] Arrival date & time: 12/09/22  1249     History  Chief Complaint  Patient presents with   Abdominal Pain    Ian Estes is a 82 y.o. male.  82 year old male with past medical history significant for diverticulitis, small bowel obstruction recently admitted to the hospital and discharged on 2/16 following acute persistent SBO.  He states his pain started about 2 days ago and significantly worsened today.  Decreased p.o. intake, decreased bowel movement since yesterday.  Did have a small bowel movement earlier today.  He is passing flatus.  Is nauseous but no episode of emesis.  He states this feels similar to his prior episodes of small bowel obstructions.  Sister accompanies him and is at bedside.  Abdomen is distended.  The history is provided by the patient. No language interpreter was used.       Home Medications Prior to Admission medications   Medication Sig Start Date End Date Taking? Authorizing Provider  acetaminophen (TYLENOL) 325 MG tablet Take 2 tablets (650 mg total) by mouth every 6 (six) hours as needed for mild pain, fever or headache. Patient not taking: Reported on 12/02/2022 10/30/22   Lavina Hamman, MD  allopurinol (ZYLOPRIM) 100 MG tablet Take 100 mg by mouth daily. Patient not taking: Reported on 12/02/2022    [provider]  apixaban (ELIQUIS) 5 MG TABS tablet Take 1 tablet (5 mg total) by mouth 2 (two) times daily. 04/17/22   Minus Breeding, MD  atorvastatin (LIPITOR) 20 MG tablet Take 20 mg by mouth daily.    [provider]  clonazePAM (KLONOPIN) 0.5 MG tablet Take 0.5 mg by mouth daily as needed for anxiety (or rest). Patient not taking: Reported on 12/02/2022    [provider]  Ensure (ENSURE) Take 237 mLs by mouth 2 (two) times daily between meals.    [provider]  Ensure Max Protein (ENSURE MAX PROTEIN) LIQD Take 330 mLs  (11 oz total) by mouth 3 (three) times daily. Patient not taking: Reported on 12/02/2022 11/12/22   Lavina Hamman, MD  folic acid (FOLVITE) 1 MG tablet Take 1 tablet (1 mg total) by mouth daily. Patient not taking: Reported on 12/02/2022 12/25/18   Antony Odea, PA-C  furosemide (LASIX) 20 MG tablet Take 1 tablet (20 mg total) by mouth daily as needed (for weight gain of >3 lbs in 1 day or >5 lbs in 1 week or for edema in legs). Patient not taking: Reported on 12/02/2022 10/30/22 10/30/23  Lavina Hamman, MD  metoprolol succinate (TOPROL-XL) 25 MG 24 hr tablet Take 0.5 tablets (12.5 mg total) by mouth daily. 11/12/22   Lavina Hamman, MD  ondansetron (ZOFRAN) 4 MG tablet Take 4 mg by mouth every 8 (eight) hours as needed for nausea or vomiting. 11/06/22   [provider]  pantoprazole (PROTONIX) 40 MG tablet Take 40 mg by mouth daily. Patient not taking: Reported on 12/02/2022 09/24/22   [provider]  Pedialyte (PEDIALYTE) SOLN Take 200 mLs by mouth 5 (five) times daily.    [provider]  traMADol (ULTRAM) 50 MG tablet Take 1 tablet (50 mg total) by mouth every 8 (eight) hours as needed for moderate pain or severe pain. Patient not taking: Reported on 12/02/2022 10/30/22 10/30/23  Lavina Hamman, MD      Allergies    Patient has no known allergies.  Review of Systems   Review of Systems  Constitutional:  Negative for chills and fever.  Respiratory:  Negative for shortness of breath.   Cardiovascular:  Negative for chest pain.  Gastrointestinal:  Positive for abdominal distention, abdominal pain and nausea. Negative for vomiting.  Genitourinary:  Negative for dysuria.  All other systems reviewed and are negative.   Physical Exam Updated Vital Signs BP 101/85   Pulse (!) 46   Temp 97.6 F (36.4 C) (Oral)   Resp 16   Ht 6' (1.829 m)   Wt 89.4 kg   SpO2 100%   BMI 26.73 kg/m  Physical Exam Vitals and nursing note reviewed.  Constitutional:       General: He is not in acute distress.    Appearance: Normal appearance. He is not ill-appearing.  HENT:     Head: Normocephalic and atraumatic.     Nose: Nose normal.  Eyes:     General: No scleral icterus.    Extraocular Movements: Extraocular movements intact.     Conjunctiva/sclera: Conjunctivae normal.  Cardiovascular:     Rate and Rhythm: Normal rate. Rhythm irregular.     Pulses: Normal pulses.  Pulmonary:     Effort: Pulmonary effort is normal. No respiratory distress.     Breath sounds: Normal breath sounds. No wheezing or rales.  Abdominal:     General: There is distension.     Tenderness: There is abdominal tenderness (Generalized). There is no right CVA tenderness, left CVA tenderness, guarding or rebound.  Musculoskeletal:        General: Normal range of motion.     Cervical back: Normal range of motion.  Skin:    General: Skin is warm and dry.  Neurological:     General: No focal deficit present.     Mental Status: He is alert. Mental status is at baseline.     ED Results / Procedures / Treatments   Labs (all labs ordered are listed, but only abnormal results are displayed) Labs Reviewed  CBC WITH DIFFERENTIAL/PLATELET - Abnormal; Notable for the following components:      Result Value   Hemoglobin 11.3 (*)    HCT 36.7 (*)    RDW 16.7 (*)    All other components within normal limits  COMPREHENSIVE METABOLIC PANEL - Abnormal; Notable for the following components:   Glucose, Bld 139 (*)    Albumin 3.1 (*)    Alkaline Phosphatase 145 (*)    All other components within normal limits  I-STAT CHEM 8, ED - Abnormal; Notable for the following components:   Sodium 134 (*)    Potassium 5.8 (*)    Glucose, Bld 136 (*)    Hemoglobin 12.9 (*)    HCT 38.0 (*)    All other components within normal limits  LIPASE, BLOOD  URINALYSIS, ROUTINE W REFLEX MICROSCOPIC    EKG None  Radiology No results found.  Procedures Procedures    Medications Ordered in  ED Medications  ondansetron (ZOFRAN) injection 4 mg (4 mg Intravenous Not Given 12/09/22 1344)  fentaNYL (SUBLIMAZE) injection 50 mcg (50 mcg Intravenous Given 12/09/22 1347)    ED Course/ Medical Decision Making/ A&P Clinical Course as of 12/09/22 1636  Wed Dec 09, 2022  1601 Creatinine: 1.11 [AA]  1636 Discussed with general surgery who agree with admission to medicine service.  They will come evaluate patient as well. [AA]    Clinical Course User Index [AA] Evlyn Courier, PA-C  Medical Decision Making Amount and/or Complexity of Data Reviewed Labs: ordered. Decision-making details documented in ED Course. Radiology: ordered.  Risk Prescription drug management. Decision regarding hospitalization.   Medical Decision Making / ED Course   This patient presents to the ED for concern of abdominal pain, this involves an extensive number of treatment options, and is a complaint that carries with it a high risk of complications and morbidity.  The differential diagnosis includes diverticulitis, small bowel obstruction, pancreatitis  MDM: 82 year old male presents with above-mentioned complaints.  Overall he is well-appearing without acute distress.  Will obtain lab work, CT imaging, and provide pain control.  CBC without leukocytosis, mild anemia which is around his baseline.  CMP shows normal electrolytes, normal kidney function.  Lipase within normal limits.  Initially hyperkalemic at 5.8 on i-STAT however normal on CMP.  CT imaging shows evidence of small bowel obstruction with transition point in the central abdomen.  Evidence of mass that was noted previously.  He was scheduled to have colonoscopy done outpatient to further evaluate this which has not been done yet.  Bradycardic during reevaluation in the 40s.  Has history of A-fib.  Previously noted to be in bradycardic rates during recent hospitalizations.  He is currently asymptomatic.  Will give liter of  fluid.  Discussed with hospitalist will evaluate patient for admission.  The request general surgery consult as they were recently involved in his care as well.   Lab Tests: -I ordered, reviewed, and interpreted labs.   The pertinent results include:   Labs Reviewed  CBC WITH DIFFERENTIAL/PLATELET - Abnormal; Notable for the following components:      Result Value   Hemoglobin 11.3 (*)    HCT 36.7 (*)    RDW 16.7 (*)    All other components within normal limits  COMPREHENSIVE METABOLIC PANEL - Abnormal; Notable for the following components:   Glucose, Bld 139 (*)    Albumin 3.1 (*)    Alkaline Phosphatase 145 (*)    All other components within normal limits  I-STAT CHEM 8, ED - Abnormal; Notable for the following components:   Sodium 134 (*)    Potassium 5.8 (*)    Glucose, Bld 136 (*)    Hemoglobin 12.9 (*)    HCT 38.0 (*)    All other components within normal limits  LIPASE, BLOOD  URINALYSIS, ROUTINE W REFLEX MICROSCOPIC      EKG  EKG Interpretation  Date/Time:    Ventricular Rate:    PR Interval:    QRS Duration:   QT Interval:    QTC Calculation:   R Axis:     Text Interpretation:           Imaging Studies ordered: I ordered imaging studies including CT abdomen pelvis with contrast I independently visualized and interpreted imaging. I agree with the radiologist interpretation   Medicines ordered and prescription drug management: Meds ordered this encounter  Medications   DISCONTD: ondansetron (ZOFRAN-ODT) disintegrating tablet 8 mg   fentaNYL (SUBLIMAZE) injection 50 mcg   ondansetron (ZOFRAN) injection 4 mg    -I have reviewed the patients home medicines and have made adjustments as needed  Critical interventions IV fluids  Consultations Obtained: I requested consultation with the general surgery,  and discussed lab and imaging findings as well as pertinent plan - they recommend: As above   Cardiac Monitoring: The patient was maintained on a  cardiac monitor.  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   Final Clinical Impression(s) / ED Diagnoses Final diagnoses:  Small bowel obstruction [facility])    Rx / DC Orders ED Discharge Orders     None         Evlyn Courier, PA-C 12/09/22 1638    Evlyn Courier, PA-C 12/09/22 1721    Carmin Muskrat, MD 12/10/22 [phone]

## 2022-12-09 NOTE — ED Notes (Signed)
Pt to ct via stretcher

## 2022-12-09 NOTE — ED Notes (Signed)
ED TO INPATIENT HANDOFF REPORT  ED Nurse Name and Phone #: Renette Butters Name/Age/Gender Ian Estes 82 y.o. male Room/Bed: WA08/WA08  Code Status   Code Status: Prior  Home/SNF/Other Home Patient oriented to: self, place, time, and situation Is this baseline? Yes   Triage Complete: Triage complete  Chief Complaint SBO (small bowel obstruction) ([place]) [id number]  Triage Note C/o generalized abd pain with nausea x3 days.  LBM this am and passing flatus with abd bloating per patient.  Recently d/c on 2/16 for SBO.  On eliquis for A-fib Colonoscopy scheduled for 3/4   Allergies No Known Allergies  Level of Care/Admitting Diagnosis ED Disposition     ED Disposition  Admit   Condition  --   Comment  Hospital Area: [facility] [[id number]]  Level of Care: Telemetry [5]  Admit to tele based on following criteria: Complex arrhythmia (Bradycardia/Tachycardia)  May admit patient to [facility] or [facility] if equivalent level of care is available:: No  Covid Evaluation: Asymptomatic - no recent exposure (last 10 days) testing not required  Diagnosis: SBO (small bowel obstruction) [facility]) [id number]  Admitting Physician: Dessa Phi [id number]  Attending Physician: Dessa Phi [id number]  Certification:: I certify there are rare and unusual circumstances requiring inpatient admission  Estimated Length of Stay: 3          B Medical/Surgery History Past Medical History:  Diagnosis Date   Arthritis    "all my joints" (06/13/2014)   Cancer ([place]) ~ 2005   "left foot; malignant; 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"   Diverticulitis    Gout    High cholesterol    Hypertension    Obesity    Pneumonia 2014 X 1   Prostate cancer ([place] [facility])    Type II diabetes mellitus ([place])    Past Surgical History:  Procedure Laterality Date   INCISION AND DRAINAGE PERIRECTAL ABSCESS  1990's   "hemorrhoid"   KNEE ARTHROSCOPY Right ~ 2011   PROSTATE SURGERY  1996   SKIN CANCER  EXCISION  ~ 2005 X 2   "left foot"   SUBXYPHOID PERICARDIAL WINDOW N/A 12/21/2018   Procedure: SUBXYPHOID PERICARDIAL WINDOW;  Surgeon: Ivin Poot, MD;  Location: [facility];  Service: Thoracic;  Laterality: N/A;   TEE WITHOUT CARDIOVERSION N/A 12/21/2018   Procedure: TRANSESOPHAGEAL ECHOCARDIOGRAM (TEE);  Surgeon: Prescott Gum, Collier Salina, MD;  Location: [facility] OR;  Service: Thoracic;  Laterality: N/A;     A IV Location/Drains/Wounds Patient Lines/Drains/Airways Status     Active Line/Drains/Airways     Name Placement date Placement time Site Days   Peripheral IV 12/09/22 20 G Right Forearm 12/09/22  1314  Forearm  less than 1            Intake/Output Last 24 hours No intake or output data in the 24 hours ending 12/09/22 1648  Labs/Imaging Results for orders placed or performed during the hospital encounter of 12/09/22 (from the past 48 hour(s))  CBC with Differential     Status: Abnormal   Collection Time: 12/09/22  1:07 PM  Result Value Ref Range   WBC 8.8 4.0 - 10.5 K/uL   RBC 4.30 4.22 - 5.81 MIL/uL   Hemoglobin 11.3 (L) 13.0 - 17.0 g/dL   HCT 36.7 (L) 39.0 - 52.0 %   MCV 85.3 80.0 - 100.0 fL   MCH 26.3 26.0 - 34.0 pg   MCHC 30.8 30.0 - 36.0 g/dL   RDW 16.7 (H) 11.5 - 15.5 %  Platelets 306 150 - 400 K/uL   nRBC 0.0 0.0 - 0.2 %   Neutrophils Relative % 69 %   Neutro Abs 6.0 1.7 - 7.7 K/uL   Lymphocytes Relative 19 %   Lymphs Abs 1.7 0.7 - 4.0 K/uL   Monocytes Relative 9 %   Monocytes Absolute 0.8 0.1 - 1.0 K/uL   Eosinophils Relative 2 %   Eosinophils Absolute 0.2 0.0 - 0.5 K/uL   Basophils Relative 1 %   Basophils Absolute 0.1 0.0 - 0.1 K/uL   Immature Granulocytes 0 %   Abs Immature Granulocytes 0.02 0.00 - 0.07 K/uL    Comment: Performed at [facility], [place] [address], [place], [place] [address]  Comprehensive metabolic panel     Status: Abnormal   Collection Time: 12/09/22  1:07 PM  Result Value Ref Range   Sodium 136 135 - 145 mmol/L    Potassium 4.4 3.5 - 5.1 mmol/L   Chloride 102 98 - 111 mmol/L   CO2 25 22 - 32 mmol/L   Glucose, Bld 139 (H) 70 - 99 mg/dL    Comment: Glucose reference range applies only to samples taken after fasting for at least 8 hours.   BUN 13 8 - 23 mg/dL   Creatinine, Ser 1.11 0.61 - 1.24 mg/dL   Calcium 8.9 8.9 - 10.3 mg/dL   Total Protein 6.7 6.5 - 8.1 g/dL   Albumin 3.1 (L) 3.5 - 5.0 g/dL   AST 27 15 - 41 U/L   ALT 16 0 - 44 U/L   Alkaline Phosphatase 145 (H) 38 - 126 U/L   Total Bilirubin 0.5 0.3 - 1.2 mg/dL   GFR, Estimated >60 >60 mL/min    Comment: (NOTE) Calculated using the CKD-EPI Creatinine Equation (2021)    Anion gap 9 5 - 15    Comment: Performed at [facility], [place] [address] [place], [place] [address]  Lipase, blood     Status: None   Collection Time: 12/09/22  1:07 PM  Result Value Ref Range   Lipase 36 11 - 51 U/L    Comment: Performed at [facility], [place] [address], [place], [place] [address]  I-stat chem 8, ED (not at [facility], [facility] or [facility])     Status: Abnormal   Collection Time: 12/09/22  1:26 PM  Result Value Ref Range   Sodium 134 (L) 135 - 145 mmol/L   Potassium 5.8 (H) 3.5 - 5.1 mmol/L   Chloride 100 98 - 111 mmol/L   BUN 14 8 - 23 mg/dL   Creatinine, Ser 1.00 0.61 - 1.24 mg/dL   Glucose, Bld 136 (H) 70 - 99 mg/dL    Comment: Glucose reference range applies only to samples taken after fasting for at least 8 hours.   Calcium, Ion 1.16 1.15 - 1.40 mmol/L   TCO2 27 22 - 32 mmol/L   Hemoglobin 12.9 (L) 13.0 - 17.0 g/dL   HCT 38.0 (L) 39.0 - 52.0 %   CT ABDOMEN PELVIS W CONTRAST  Result Date: 12/09/2022 CLINICAL DATA:  Abdominal pain, acute, nonlocalized. History of small-bowel obstruction. EXAM: CT ABDOMEN AND PELVIS WITH CONTRAST TECHNIQUE: Multidetector CT imaging of the abdomen and pelvis was performed using the standard protocol following bolus administration of intravenous contrast. RADIATION DOSE REDUCTION: This exam was  performed according to the departmental dose-optimization program which includes automated exposure control, adjustment of the mA and/or kV according to patient size and/or use of iterative reconstruction technique.  CONTRAST:  152m OMNIPAQUE IOHEXOL 300 MG/ML  SOLN COMPARISON:  CT abdomen and pelvis 12/02/2022 FINDINGS: Lower chest: Chronic fibrosis or scarring in the lung bases. No pleural effusion. Cardiomegaly and coronary atherosclerosis. Hepatobiliary: Diffusely decreased attenuation of the liver suggestive of steatosis. Mildly lobular liver contour without definite findings of cirrhosis. Unremarkable gallbladder. No biliary dilatation. Pancreas: Fatty atrophy, greatest in the pancreatic head and neck region. No ductal dilatation or acute inflammation. Spleen: Unremarkable. Adrenals/Urinary Tract: Mild chronic thickening of the adrenal glands. Bilateral renal atrophy. 18 mm cyst in the interpolar left kidney for which no follow-up imaging is recommended. No renal calculi or hydronephrosis. Unremarkable bladder. Stomach/Bowel: The stomach is unremarkable. There are multiple dilated loops of small bowel with air-fluid levels measuring up to 4.6 cm in diameter in the central abdomen and right upper quadrant. A small bowel loop anteriorly in the right mid abdomen demonstrates wall thickening and hyperenhancement, and adjacent inflammatory changes track into the central mesentery. The distal small bowel is decompressed, with suspected transition in the central abdomen. Numerous small bowel diverticula are again noted. There is also widespread colonic diverticulosis. An annular, a suspected mass in the proximal ascending colon is unchanged. The appendix is unremarkable. Vascular/Lymphatic: Abdominal aortic atherosclerosis without aneurysm. No enlarged lymph nodes. Reproductive: Small or absent prostate. Other: Trace pelvic free fluid. No organized fluid collection. No pneumoperitoneum. Musculoskeletal: No acute  osseous abnormality or suspicious osseous lesion. IMPRESSION: 1. Recurrent small bowel obstruction with suspected transition in the central abdomen. Associated inflammation of a small bowel loop in the right mid abdomen. 2. Unchanged suspected mass in the ascending colon. 3.  Aortic Atherosclerosis (ICD10-I70.0). Electronically Signed   By: ALogan BoresM.D.   On: 12/09/2022 15:36    Pending Labs Unresulted Labs (From admission, onward)     Start     Ordered   12/09/22 10000000 Basic metabolic panel  ONCE - STAT,   STAT        12/09/22 1620   12/09/22 1621  Potassium  STAT Now then every 4 hours ,   R     Comments: Until normal twice.    12/09/22 1620   12/09/22 1307  Urinalysis, Routine w reflex microscopic -Urine, Clean Catch  Once,   URGENT       Question:  Specimen Source  Answer:  Urine, Clean Catch   12/09/22 1306            Vitals/Pain Today's Vitals   12/09/22 1530 12/09/22 1545 12/09/22 1600 12/09/22 1643  BP: (!) 99/51 (!) 99/48 105/81   Pulse:  (!) 43 (!) 39   Resp: 14 15 16   $ Temp:      TempSrc:      SpO2: 98% 97% 97%   Weight:      Height:      PainSc:    8     Isolation Precautions No active isolations  Medications Medications  ondansetron (ZOFRAN) injection 4 mg (4 mg Intravenous Not Given 12/09/22 1344)  furosemide (LASIX) injection 40 mg (0 mg Intravenous Hold 12/09/22 1638)  sodium chloride 0.9 % bolus 500 mL (has no administration in time range)  fentaNYL (SUBLIMAZE) injection 50 mcg (50 mcg Intravenous Given 12/09/22 1347)  iohexol (OMNIPAQUE) 300 MG/ML solution 100 mL (100 mLs Intravenous Contrast Given 12/09/22 1446)  fentaNYL (SUBLIMAZE) injection 50 mcg (50 mcg Intravenous Given 12/09/22 1644)  lactated ringers bolus 1,000 mL (1,000 mLs Intravenous New Bag/Given 12/09/22 1643)    Mobility walks  Focused Assessments     R Recommendations: See Admitting Provider Note  Report given to:   Additional Notes:

## 2022-12-09 NOTE — H&P (Signed)
History and Physical    Dennison Labombard [id number] DOB: November 14, 1940 DOA: 12/09/2022  PCP: Aletha Halim., PA-C  Patient coming from: Home  Chief Complaint: Abdominal pain, nausea  HPI: Ian Estes is a 82 y.o. male with medical history significant of CKD stage III, A-fib on Eliquis, hypertension, hyperlipidemia who presents with abdominal pain and nausea.  He was admitted from 1/7 to 1/12 secondary to acute sigmoid diverticulitis with contained perforation/abscess.  He was also admitted again 1/21 to 1/25 secondary to recurrent diverticulitis.  Admitted again 2/14 to 2/16 secondary to small bowel obstruction.  At that time, his small bowel obstruction resolved with conservative measures.  He was recommended for outpatient colonoscopy.  He now returns with recurrent abdominal pain.  He states that he has been tolerating diet at home, abdominal pain started yesterday.  Significantly worsened today.  Denies any vomiting.  No other issues such as fevers, shortness of breath, chest pain or cough.  His last bowel movement was today, had a small amount.  ED Course: Labs show hyperkalemia 5.8.  CT abdomen pelvis showed recurrent SBO with suspected transition in central abdomen with associated inflammation of the small bowel loop in right mid abdomen.  Unchanged suspected mass in ascending colon.  Patient was referred for admission to the hospitalist service.  Asked EDP to also alert general surgery team of patient's admission for consult.  Review of Systems: As per HPI. Otherwise, all other review of systems reviewed and are negative.   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"   Diverticulitis    Gout    High cholesterol    Hypertension    Obesity    Pneumonia 2014 X 1   Prostate cancer ([place])    Type II diabetes mellitus ([place])     Past Surgical History:  Procedure  Laterality Date   INCISION AND DRAINAGE PERIRECTAL ABSCESS  1990's   "hemorrhoid"   KNEE ARTHROSCOPY Right ~ 2011   PROSTATE SURGERY  1996   SKIN CANCER EXCISION  ~ 2005 X 2   "left foot"   SUBXYPHOID PERICARDIAL WINDOW N/A 12/21/2018   Procedure: SUBXYPHOID PERICARDIAL WINDOW;  Surgeon: Ivin Poot, MD;  Location: [facility];  Service: Thoracic;  Laterality: N/A;   TEE WITHOUT CARDIOVERSION N/A 12/21/2018   Procedure: TRANSESOPHAGEAL ECHOCARDIOGRAM (TEE);  Surgeon: Prescott Gum, Collier Salina, MD;  Location: [facility];  Service: Thoracic;  Laterality: N/A;     reports that he quit smoking about 35 years ago. His smoking use included cigarettes. He has a 48.00 pack-year smoking history. He has never used smokeless tobacco. He reports current alcohol use of about 21.0 standard drinks of alcohol per week. He reports that he does not use drugs.  No Known Allergies  Family History  Problem Relation Age of Onset   CAD Neg Hx     Prior to Admission medications   Medication Sig Start Date End Date Taking? Authorizing Provider  apixaban (ELIQUIS) 5 MG TABS tablet Take 1 tablet (5 mg total) by mouth 2 (two) times daily. 04/17/22  Yes Minus Breeding, MD  Ensure (ENSURE) Take 237 mLs by mouth 2 (two) times daily between meals.   Yes [provider]  metoprolol succinate (TOPROL-XL) 25 MG 24 hr tablet Take 0.5 tablets (12.5 mg total) by mouth daily. 11/12/22  Yes Lavina Hamman, MD  Pedialyte ([place])  SOLN Take 200 mLs by mouth 5 (five) times daily.   Yes [provider]  TYLENOL 500 MG tablet Take 1,000 mg by mouth every 6 (six) hours as needed for mild pain.   Yes [provider]  acetaminophen (TYLENOL) 325 MG tablet Take 2 tablets (650 mg total) by mouth every 6 (six) hours as needed for mild pain, fever or headache. Patient not taking: Reported on 12/09/2022 10/30/22   Lavina Hamman, MD  allopurinol (ZYLOPRIM) 100 MG tablet Take 100 mg by mouth daily. Patient not taking: Reported  on 12/09/2022    [provider]  atorvastatin (LIPITOR) 20 MG tablet Take 20 mg by mouth daily. Patient not taking: Reported on 12/09/2022    [provider]  clonazePAM (KLONOPIN) 0.5 MG tablet Take 0.5 mg by mouth daily as needed for anxiety (or rest). Patient not taking: Reported on 12/02/2022    [provider]  Ensure Max Protein (ENSURE MAX PROTEIN) LIQD Take 330 mLs (11 oz total) by mouth 3 (three) times daily. Patient not taking: Reported on 12/02/2022 11/12/22   Lavina Hamman, MD  folic acid (FOLVITE) 1 MG tablet Take 1 tablet (1 mg total) by mouth daily. Patient not taking: Reported on 12/02/2022 12/25/18   Antony Odea, PA-C  furosemide (LASIX) 20 MG tablet Take 1 tablet (20 mg total) by mouth daily as needed (for weight gain of >3 lbs in 1 day or >5 lbs in 1 week or for edema in legs). Patient not taking: Reported on 12/02/2022 10/30/22 10/30/23  Lavina Hamman, MD  ondansetron (ZOFRAN) 4 MG tablet Take 4 mg by mouth every 8 (eight) hours as needed for nausea or vomiting. Patient not taking: Reported on 12/09/2022 11/06/22   [provider]  pantoprazole (PROTONIX) 40 MG tablet Take 40 mg by mouth daily. Patient not taking: Reported on 12/02/2022 09/24/22   [provider]  traMADol (ULTRAM) 50 MG tablet Take 1 tablet (50 mg total) by mouth every 8 (eight) hours as needed for moderate pain or severe pain. Patient not taking: Reported on 12/02/2022 10/30/22 10/30/23  Lavina Hamman, MD    Physical Exam: Vitals:   12/09/22 1545 12/09/22 1600 12/09/22 1615 12/09/22 1645  BP: (!) 99/48 105/81 119/71 (!) 146/64  Pulse: (!) 43 (!) 39 (!) 44 (!) 41  Resp: 15 16 18 13  $ Temp:      TempSrc:      SpO2: 97% 97% 99% 100%  Weight:      Height:         Constitutional: NAD, calm, comfortable Eyes: PERRL, lids and conjunctivae normal ENMT: Mucous membranes are moist. Normal dentition.  Respiratory: Clear to auscultation bilaterally, no wheezing,  no crackles. Normal respiratory effort. No accessory muscle use. No conversational dyspnea  Cardiovascular: Bradycardic rate and irregular rhythm, no murmurs. No extremity edema.  Abdomen: Soft, nondistended, nontender to palpation. Bowel sounds positive.  Musculoskeletal: No joint deformity upper and lower extremities. No contractures. Normal muscle tone.  Skin: no rashes, lesions, ulcers on exposed skin  Neurologic: Alert and oriented, speech fluent, CN 2-12 grossly intact. No focal deficits.   Psychiatric: Normal judgment and insight. Normal mood and affect   Labs on Admission: I have personally reviewed following labs and imaging studies  CBC: Recent Labs  Lab 12/02/22 1818 12/03/22 0347 12/04/22 0402 12/09/22 1307 12/09/22 1326  WBC 7.6 6.2 7.2 8.8  --   NEUTROABS  --   --   --  6.0  --  HGB 9.7* 10.3* 9.9* 11.3* 12.9*  HCT 31.1* 33.6* 32.5* 36.7* 38.0*  MCV 85.4 87.3 86.0 85.3  --   PLT 252 228 244 306  --    Basic Metabolic Panel: Recent Labs  Lab 12/02/22 1818 12/03/22 0347 12/04/22 0402 12/09/22 1307 12/09/22 1326  NA  --  133* 137 136 134*  K  --  4.2 4.1 4.4 5.8*  CL  --  107 108 102 100  CO2  --  19* 21* 25  --   GLUCOSE  --  93 94 139* 136*  BUN  --  12 12 13 14  $ CREATININE 0.93 0.96 1.00 1.11 1.00  CALCIUM  --  8.0* 8.2* 8.9  --    GFR: Estimated Creatinine Clearance: 62.5 mL/min (by C-G formula based on SCr of 1 mg/dL). Liver Function Tests: Recent Labs  Lab 12/09/22 1307  AST 27  ALT 16  ALKPHOS 145*  BILITOT 0.5  PROT 6.7  ALBUMIN 3.1*   Recent Labs  Lab 12/09/22 1307  LIPASE 36   No results for input(s): "AMMONIA" in the last 168 hours. Coagulation Profile: No results for input(s): "INR", "PROTIME" in the last 168 hours. Cardiac Enzymes: No results for input(s): "CKTOTAL", "CKMB", "CKMBINDEX", "TROPONINI" in the last 168 hours. BNP (last 3 results) No results for input(s): "PROBNP" in the last 8760 hours. HbA1C: No results for  input(s): "HGBA1C" in the last 72 hours. CBG: Recent Labs  Lab 12/03/22 1648 12/03/22 2021 12/04/22 0015 12/04/22 0353 12/04/22 0753  GLUCAP 82 93 107* 91 111*   Lipid Profile: No results for input(s): "CHOL", "HDL", "LDLCALC", "TRIG", "CHOLHDL", "LDLDIRECT" in the last 72 hours. Thyroid Function Tests: No results for input(s): "TSH", "T4TOTAL", "FREET4", "T3FREE", "THYROIDAB" in the last 72 hours. Anemia Panel: No results for input(s): "VITAMINB12", "FOLATE", "FERRITIN", "TIBC", "IRON", "RETICCTPCT" in the last 72 hours. Urine analysis:    Component Value Date/Time   COLORURINE YELLOW 12/02/2022 1711   APPEARANCEUR CLEAR 12/02/2022 1711   LABSPEC 1.024 12/02/2022 1711   PHURINE 5.0 12/02/2022 1711   GLUCOSEU NEGATIVE 12/02/2022 1711   HGBUR NEGATIVE 12/02/2022 1711   BILIRUBINUR NEGATIVE 12/02/2022 1711   KETONESUR NEGATIVE 12/02/2022 1711   PROTEINUR NEGATIVE 12/02/2022 1711   UROBILINOGEN 0.2 06/13/2014 1742   NITRITE NEGATIVE 12/02/2022 1711   LEUKOCYTESUR NEGATIVE 12/02/2022 1711   Sepsis Labs: !!!!!!!!!!!!!!!!!!!!!!!!!!!!!!!!!!!!!!!!!!!! @LABRCNTIP$ (procalcitonin:4,lacticidven:4) )No results found for this or any previous visit (from the past 240 hour(s)).   Radiological Exams on Admission: CT ABDOMEN PELVIS W CONTRAST  Result Date: 12/09/2022 CLINICAL DATA:  Abdominal pain, acute, nonlocalized. History of small-bowel obstruction. EXAM: CT ABDOMEN AND PELVIS WITH CONTRAST TECHNIQUE: Multidetector CT imaging of the abdomen and pelvis was performed using the standard protocol following bolus administration of intravenous contrast. RADIATION DOSE REDUCTION: This exam was performed according to the departmental dose-optimization program which includes automated exposure control, adjustment of the mA and/or kV according to patient size and/or use of iterative reconstruction technique. CONTRAST:  189m OMNIPAQUE IOHEXOL 300 MG/ML  SOLN COMPARISON:  CT abdomen and pelvis  12/02/2022 FINDINGS: Lower chest: Chronic fibrosis or scarring in the lung bases. No pleural effusion. Cardiomegaly and coronary atherosclerosis. Hepatobiliary: Diffusely decreased attenuation of the liver suggestive of steatosis. Mildly lobular liver contour without definite findings of cirrhosis. Unremarkable gallbladder. No biliary dilatation. Pancreas: Fatty atrophy, greatest in the pancreatic head and neck region. No ductal dilatation or acute inflammation. Spleen: Unremarkable. Adrenals/Urinary Tract: Mild chronic thickening of the adrenal glands. Bilateral renal atrophy. 18 mm  cyst in the interpolar left kidney for which no follow-up imaging is recommended. No renal calculi or hydronephrosis. Unremarkable bladder. Stomach/Bowel: The stomach is unremarkable. There are multiple dilated loops of small bowel with air-fluid levels measuring up to 4.6 cm in diameter in the central abdomen and right upper quadrant. A small bowel loop anteriorly in the right mid abdomen demonstrates wall thickening and hyperenhancement, and adjacent inflammatory changes track into the central mesentery. The distal small bowel is decompressed, with suspected transition in the central abdomen. Numerous small bowel diverticula are again noted. There is also widespread colonic diverticulosis. An annular, a suspected mass in the proximal ascending colon is unchanged. The appendix is unremarkable. Vascular/Lymphatic: Abdominal aortic atherosclerosis without aneurysm. No enlarged lymph nodes. Reproductive: Small or absent prostate. Other: Trace pelvic free fluid. No organized fluid collection. No pneumoperitoneum. Musculoskeletal: No acute osseous abnormality or suspicious osseous lesion. IMPRESSION: 1. Recurrent small bowel obstruction with suspected transition in the central abdomen. Associated inflammation of a small bowel loop in the right mid abdomen. 2. Unchanged suspected mass in the ascending colon. 3.  Aortic Atherosclerosis  (ICD10-I70.0). Electronically Signed   By: Logan Bores M.D.   On: 12/09/2022 15:36    EKG: Pending  Assessment/Plan Principal Problem:   SBO (small bowel obstruction) (HCC) Active Problems:   Chronic atrial fibrillation (HCC)   Chronic kidney disease, stage 3a (HCC)   Hypertension   Colonic mass   Hyperkalemia   Recurrent small bowel obstruction, recent history of diverticulitis -N.p.o., IV fluid, supportive care -General surgery consulted  Hyperkalemia -IV Lasix -EKG pending  Chronic A-fib -Hold metoprolol, Eliquis  Colon mass -Follow up with colonoscopy outpatient    DVT prophylaxis: SCD   Code Status: DNR.  Discussed at time of admission.  He declines resuscitative efforts such as CPR, mechanical ventilation in case of cardiac arrest Family Communication: No family at bedside Disposition Plan: Home Consults called: Gen surg     Severity of Illness: The appropriate patient status for this patient is INPATIENT. Inpatient status is judged to be reasonable and necessary in order to provide the required intensity of service to ensure the patient's safety. The patient's presenting symptoms, physical exam findings, and initial radiographic and laboratory data in the context of their chronic comorbidities is felt to place them at high risk for further clinical deterioration. Furthermore, it is not anticipated that the patient will be medically stable for discharge from the hospital within 2 midnights of admission.   * I certify that at the point of admission it is my clinical judgment that the patient will require inpatient hospital care spanning beyond 2 midnights from the point of admission due to high intensity of service, high risk for further deterioration and high frequency of surveillance required.Dessa Phi, DO [facility] 12/09/2022, 4:53 PM   Available via Epic secure chat 7am-7pm After these hours, please refer to coverage provider listed on  [email or web address]

## 2022-12-09 NOTE — ED Notes (Signed)
Pt attempted to provide a urine specimen. Pt was unsuccessful at this time.

## 2022-12-09 NOTE — ED Provider Triage Note (Signed)
Emergency Medicine Provider Triage Evaluation Note  Winson Olcott , a 82 y.o. male  was evaluated in triage.  Pt complains of abdominal pain that feels similar to his prior episodes of SBO.  Recently discharged on 2/16.  Did have a bowel movement yesterday and states he had a small BM this morning as well.  Is very nauseous but no emesis.  Last p.o. intake was an hour ago, but was very minimal.  Denies fever, dysuria..  Review of Systems  Positive: As above Negative: As above  Physical Exam  BP 124/79 (BP Location: Left Arm)   Pulse 60   Temp 97.6 F (36.4 C) (Oral)   Resp 16   SpO2 100%  Gen:   Awake, no distress   Resp:  Normal effort  MSK:   Moves extremities without difficulty  Other:  Distention noted, mild tenderness  Medical Decision Making  Medically screening exam initiated at 1:07 PM.  Appropriate orders placed.  Chalen Kuchenbecker was informed that the remainder of the evaluation will be completed by another provider, this initial triage assessment does not replace that evaluation, and the importance of remaining in the ED until their evaluation is complete.     Evlyn Courier, PA-C 12/09/22 [id number]

## 2022-12-09 NOTE — Progress Notes (Signed)
Subjective: CC: Discharged from hospital yesterday after short admission for SBO. Returns today with abdominal pain and nausea. Last BM was yesterday morning.  He is having some flatus.      Objective: Vital signs in last 24 hours: Temp:  [97.6 F (36.4 C)] 97.6 F (36.4 C) (02/21 1253) Pulse Rate:  [43-73] 43 (02/21 1545) Resp:  [14-18] 15 (02/21 1545) BP: (99-125)/(48-85) 99/48 (02/21 1545) SpO2:  [97 %-100 %] 97 % (02/21 1545) Weight:  [89.4 kg] 89.4 kg (02/21 1415)    Intake/Output from previous day: No intake/output data recorded. Intake/Output this shift: No intake/output data recorded.  PE: Gen:  Alert, NAD, pleasant Abd: Soft, NT, sl distended.    Lab Results:  Recent Labs    12/09/22 1307 12/09/22 1326  WBC 8.8  --   HGB 11.3* 12.9*  HCT 36.7* 38.0*  PLT 306  --    BMET Recent Labs    12/09/22 1307 12/09/22 1326  NA 136 134*  K 4.4 5.8*  CL 102 100  CO2 25  --   GLUCOSE 139* 136*  BUN 13 14  CREATININE 1.11 1.00  CALCIUM 8.9  --    PT/INR No results for input(s): "LABPROT", "INR" in the last 72 hours. CMP     Component Value Date/Time   NA 134 (L) 12/09/2022 1326   NA 138 01/12/2019 0852   K 5.8 (H) 12/09/2022 1326   CL 100 12/09/2022 1326   CO2 25 12/09/2022 1307   GLUCOSE 136 (H) 12/09/2022 1326   BUN 14 12/09/2022 1326   BUN 20 01/12/2019 0852   CREATININE 1.00 12/09/2022 1326   CALCIUM 8.9 12/09/2022 1307   PROT 6.7 12/09/2022 1307   ALBUMIN 3.1 (L) 12/09/2022 1307   AST 27 12/09/2022 1307   ALT 16 12/09/2022 1307   ALKPHOS 145 (H) 12/09/2022 1307   BILITOT 0.5 12/09/2022 1307   GFRNONAA >60 12/09/2022 1307   GFRAA 63 01/12/2019 0852   Lipase     Component Value Date/Time   LIPASE 36 12/09/2022 1307    Studies/Results: CT ABDOMEN PELVIS W CONTRAST  Result Date: 12/09/2022 CLINICAL DATA:  Abdominal pain, acute, nonlocalized. History of small-bowel obstruction. EXAM: CT ABDOMEN AND PELVIS WITH CONTRAST TECHNIQUE:  Multidetector CT imaging of the abdomen and pelvis was performed using the standard protocol following bolus administration of intravenous contrast. RADIATION DOSE REDUCTION: This exam was performed according to the departmental dose-optimization program which includes automated exposure control, adjustment of the mA and/or kV according to patient size and/or use of iterative reconstruction technique. CONTRAST:  163m OMNIPAQUE IOHEXOL 300 MG/ML  SOLN COMPARISON:  CT abdomen and pelvis 12/02/2022 FINDINGS: Lower chest: Chronic fibrosis or scarring in the lung bases. No pleural effusion. Cardiomegaly and coronary atherosclerosis. Hepatobiliary: Diffusely decreased attenuation of the liver suggestive of steatosis. Mildly lobular liver contour without definite findings of cirrhosis. Unremarkable gallbladder. No biliary dilatation. Pancreas: Fatty atrophy, greatest in the pancreatic head and neck region. No ductal dilatation or acute inflammation. Spleen: Unremarkable. Adrenals/Urinary Tract: Mild chronic thickening of the adrenal glands. Bilateral renal atrophy. 18 mm cyst in the interpolar left kidney for which no follow-up imaging is recommended. No renal calculi or hydronephrosis. Unremarkable bladder. Stomach/Bowel: The stomach is unremarkable. There are multiple dilated loops of small bowel with air-fluid levels measuring up to 4.6 cm in diameter in the central abdomen and right upper quadrant. A small bowel loop anteriorly in the right mid abdomen demonstrates wall thickening and  hyperenhancement, and adjacent inflammatory changes track into the central mesentery. The distal small bowel is decompressed, with suspected transition in the central abdomen. Numerous small bowel diverticula are again noted. There is also widespread colonic diverticulosis. An annular, a suspected mass in the proximal ascending colon is unchanged. The appendix is unremarkable. Vascular/Lymphatic: Abdominal aortic atherosclerosis without  aneurysm. No enlarged lymph nodes. Reproductive: Small or absent prostate. Other: Trace pelvic free fluid. No organized fluid collection. No pneumoperitoneum. Musculoskeletal: No acute osseous abnormality or suspicious osseous lesion. IMPRESSION: 1. Recurrent small bowel obstruction with suspected transition in the central abdomen. Associated inflammation of a small bowel loop in the right mid abdomen. 2. Unchanged suspected mass in the ascending colon. 3.  Aortic Atherosclerosis (ICD10-I70.0). Electronically Signed   By: Logan Bores M.D.   On: 12/09/2022 15:36    Anti-infectives: Anti-infectives (From admission, onward)    None        Assessment/Plan SBO, recurrent Patient with recent admission for diverticulitis in Jan 2024. During that admission he underwent IR drain placement. There was concern that the drain migrated through SB lumen and the drain was removed 1/22. Question if SBO is 2/2 to this.  - patient just discharged yesterday for SBO that was resolving - had contrast in his colon and was tolerating PO. Returns today with abdominal pain and nausea. CT scan performed today shows SBO with transition zone in central abdomen.  This is partial SBO given that he is still passing some gas. Will repeat SB protocol.     FEN - NPO,  NGT,  IVF per [facility] VTE - SCDs, Lovenox ID - None  I reviewed nursing notes, hospitalist notes, last 24 h vitals and pain scores, last 48 h intake and output, last 24 h labs and trends, and last 24 h imaging results.   LOS: 0 days   Milus Height, MD FACS Surgical Oncology, General Surgery, Trauma and [facility], [place] for weekday/non holidays Check [email or web address] for coverage night/weekend/holidays  Do not use SecureChat as it is not reliable for timely patient care.    [facility] 12/09/2022, 4:36 PM Please see Amion for pager number during day hours 7:00am-4:30pm

## 2022-12-09 NOTE — ED Triage Notes (Signed)
C/o generalized abd pain with nausea x3 days.  LBM this am and passing flatus with abd bloating per patient.  Recently d/c on 2/16 for SBO.  On eliquis for A-fib Colonoscopy scheduled for 3/4

## 2022-12-10 ENCOUNTER — Inpatient Hospital Stay (HOSPITAL_COMMUNITY): Payer: Medicare HMO

## 2022-12-10 DIAGNOSIS — K56609 Unspecified intestinal obstruction, unspecified as to partial versus complete obstruction: Secondary | ICD-10-CM

## 2022-12-10 LAB — COMPREHENSIVE METABOLIC PANEL
ALT: 13 U/L (ref 0–44)
AST: 29 U/L (ref 15–41)
Albumin: 2.6 g/dL — ABNORMAL LOW (ref 3.5–5.0)
Alkaline Phosphatase: 124 U/L (ref 38–126)
Anion gap: 10 (ref 5–15)
BUN: 11 mg/dL (ref 8–23)
CO2: 22 mmol/L (ref 22–32)
Calcium: 8.6 mg/dL — ABNORMAL LOW (ref 8.9–10.3)
Chloride: 103 mmol/L (ref 98–111)
Creatinine, Ser: 1.23 mg/dL (ref 0.61–1.24)
GFR, Estimated: 59 mL/min — ABNORMAL LOW (ref >60)
Glucose, Bld: 91 mg/dL (ref 70–99)
Potassium: 5.1 mmol/L (ref 3.5–5.1)
Sodium: 135 mmol/L (ref 135–145)
Total Bilirubin: 0.8 mg/dL (ref 0.3–1.2)
Total Protein: 5.8 g/dL — ABNORMAL LOW (ref 6.5–8.1)

## 2022-12-10 LAB — CBC
HCT: 31.7 % — ABNORMAL LOW (ref 39.0–52.0)
Hemoglobin: 9.9 g/dL — ABNORMAL LOW (ref 13.0–17.0)
MCH: 26.9 pg (ref 26.0–34.0)
MCHC: 31.2 g/dL (ref 30.0–36.0)
MCV: 86.1 fL (ref 80.0–100.0)
Platelets: 221 10*3/uL (ref 150–400)
RBC: 3.68 MIL/uL — ABNORMAL LOW (ref 4.22–5.81)
RDW: 16.6 % — ABNORMAL HIGH (ref 11.5–15.5)
WBC: 6.8 10*3/uL (ref 4.0–10.5)
nRBC: 0 % (ref 0.0–0.2)

## 2022-12-10 LAB — POTASSIUM: Potassium: 4.5 mmol/L (ref 3.5–5.1)

## 2022-12-10 MED ORDER — PEG 3350-KCL-NA BICARB-NACL 420 G PO SOLR
4000.0000 mL | Freq: Once | ORAL | Status: AC
  Administered 2022-12-10: 4000 mL via ORAL

## 2022-12-10 NOTE — Consult Note (Signed)
Reason for Consult: Ascending colon mass and hematochezia Referring Physician: [facility]  Ian Estes HPI: This is an 82 year old male with a PMH of a contained perforated sigmoid diverticulitis (10/2022), history of colonic polyps, HTN, hyperlipidemia, and CKD admitted for a recurrent small bowel obstruction.  This is the second admission this month for an SBO and previously it resolved with conservative measures.  It appears that his SBO again resolved with an NG tube.  It is reported that he is passing flatus and he had some formed bowel movements today.  Todays's KUB shows that there is contrast in the colon and the SBO appears resolved.  He also experienced hematochezia and his HGB dropped from 12.9 down to 9.9 g/dL.  His baseline HGB ranges anywhere from 10-12 g/dL.  In 10/2022 he was admitted twice for a contained perforated sigmoid diverticulitis and a recurrence a couple of weeks later.  During the second January admission the CT scan showed that he had a mass in the ascending colon.  He had colonoscopies with Dr. Collene Mares in 2004, 2009, and 2014.  His colonoscopy in 2009 was the only procedure that showed a TA and an SSA.  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"   Diverticulitis    Gout    High cholesterol    Hypertension    Obesity    Pneumonia 2014 X 1   Prostate cancer ([place])    Type II diabetes mellitus ([place])     Past Surgical History:  Procedure Laterality Date   INCISION AND DRAINAGE PERIRECTAL ABSCESS  1990's   "hemorrhoid"   KNEE ARTHROSCOPY Right ~ 2011   PROSTATE SURGERY  1996   SKIN CANCER EXCISION  ~ 2005 X 2   "left foot"   SUBXYPHOID PERICARDIAL WINDOW N/A 12/21/2018   Procedure: SUBXYPHOID PERICARDIAL WINDOW;  Surgeon: Ivin Poot, MD;  Location: [facility];  Service: Thoracic;  Laterality: N/A;   TEE WITHOUT CARDIOVERSION N/A 12/21/2018    Procedure: TRANSESOPHAGEAL ECHOCARDIOGRAM (TEE);  Surgeon: Prescott Gum, Collier Salina, MD;  Location: [facility] OR;  Service: Thoracic;  Laterality: N/A;    Family History  Problem Relation Age of Onset   CAD Neg Hx     Social History:  reports that he quit smoking about 35 years ago. His smoking use included cigarettes. He has a 48.00 pack-year smoking history. He has never used smokeless tobacco. He reports current alcohol use of about 21.0 standard drinks of alcohol per week. He reports that he does not use drugs.  Allergies: No Known Allergies  Medications: Scheduled:  furosemide  40 mg Intravenous Once   ondansetron (ZOFRAN) IV  4 mg Intravenous Once   polyethylene glycol-electrolytes  4,000 mL Oral Once   sodium chloride flush  3 mL Intravenous Q12H   Continuous:  sodium chloride 75 mL/hr at 12/10/22 1301    Results for orders placed or performed during the hospital encounter of 12/09/22 (from the past 24 hour(s))  Basic metabolic panel     Status: Abnormal   Collection Time: 12/09/22  6:12 PM  Result Value Ref Range   Sodium 134 (L) 135 - 145 mmol/L   Potassium 4.5 3.5 - 5.1 mmol/L   Chloride 102 98 - 111 mmol/L   CO2 22 22 - 32 mmol/L   Glucose, Bld 99 70 - 99 mg/dL   BUN  12 8 - 23 mg/dL   Creatinine, Ser 1.08 0.61 - 1.24 mg/dL   Calcium 8.8 (L) 8.9 - 10.3 mg/dL   GFR, Estimated >60 >60 mL/min   Anion gap 10 5 - 15  Potassium     Status: None   Collection Time: 12/10/22 12:34 AM  Result Value Ref Range   Potassium 4.5 3.5 - 5.1 mmol/L  CBC     Status: Abnormal   Collection Time: 12/10/22  5:23 AM  Result Value Ref Range   WBC 6.8 4.0 - 10.5 K/uL   RBC 3.68 (L) 4.22 - 5.81 MIL/uL   Hemoglobin 9.9 (L) 13.0 - 17.0 g/dL   HCT 31.7 (L) 39.0 - 52.0 %   MCV 86.1 80.0 - 100.0 fL   MCH 26.9 26.0 - 34.0 pg   MCHC 31.2 30.0 - 36.0 g/dL   RDW 16.6 (H) 11.5 - 15.5 %   Platelets 221 150 - 400 K/uL   nRBC 0.0 0.0 - 0.2 %  Comprehensive metabolic panel     Status: Abnormal   Collection  Time: 12/10/22  5:23 AM  Result Value Ref Range   Sodium 135 135 - 145 mmol/L   Potassium 5.1 3.5 - 5.1 mmol/L   Chloride 103 98 - 111 mmol/L   CO2 22 22 - 32 mmol/L   Glucose, Bld 91 70 - 99 mg/dL   BUN 11 8 - 23 mg/dL   Creatinine, Ser 1.23 0.61 - 1.24 mg/dL   Calcium 8.6 (L) 8.9 - 10.3 mg/dL   Total Protein 5.8 (L) 6.5 - 8.1 g/dL   Albumin 2.6 (L) 3.5 - 5.0 g/dL   AST 29 15 - 41 U/L   ALT 13 0 - 44 U/L   Alkaline Phosphatase 124 38 - 126 U/L   Total Bilirubin 0.8 0.3 - 1.2 mg/dL   GFR, Estimated 59 (L) >60 mL/min   Anion gap 10 5 - 15     DG Abd Portable 1V-Small Bowel Obstruction Protocol-initial, 8 hr delay  Result Date: 12/10/2022 CLINICAL DATA:  82 year old male with abdominal pain, CT appearance of small-bowel obstruction yesterday. 8 hours status post oral contrast administration yesterday. EXAM: PORTABLE ABDOMEN - 1 VIEW COMPARISON:  Abdominal radiographs yesterday 2052 hours and earlier. FINDINGS: Portable AP supine view at 0734 hours. Oral contrast appears to be primarily within redundant colon, including the rectum. Superimposed excreted IV contrast in the urinary bladder is also possible. Bowel-gas pattern compared to the CT scout view yesterday is improved. Mild residual gas distended small bowel loops. Stable visualized osseous structures. Pelvic sidewall surgical clips. IMPRESSION: Evidence of improved or resolved small bowel obstruction with administered oral contrast primarily within the colon. Mild residual gas distended small bowel loops. Electronically Signed   By: Genevie Ann M.D.   On: 12/10/2022 07:55   DG Abd 1 View  Result Date: 12/09/2022 CLINICAL DATA:  Check gastric catheter placement EXAM: ABDOMEN - 1 VIEW COMPARISON:  Film from earlier in the same day. FINDINGS: Gastric catheter has been advanced slightly deeper into the stomach. Proximal side port now lies in the gastric lumen. IMPRESSION: Gastric catheter has been advanced and now lies in satisfactory position.  Electronically Signed   By: Inez Catalina M.D.   On: 12/09/2022 23:02   DG Abd Portable 1V-Small Bowel Protocol-Position Verification  Result Date: 12/09/2022 CLINICAL DATA:  Check gastric catheter placement EXAM: PORTABLE ABDOMEN - 1 VIEW COMPARISON:  CT from earlier in the same day. FINDINGS: Gastric catheter is noted with  the tip in the stomach. Proximal side port however is noted in the distal esophagus. This could be advanced several cm deeper into the stomach. Persistent small bowel dilatation is noted in the right hemiabdomen. IMPRESSION: Gastric catheter as described. This should be advanced deeper into the stomach. Electronically Signed   By: Inez Catalina M.D.   On: 12/09/2022 19:03   CT ABDOMEN PELVIS W CONTRAST  Result Date: 12/09/2022 CLINICAL DATA:  Abdominal pain, acute, nonlocalized. History of small-bowel obstruction. EXAM: CT ABDOMEN AND PELVIS WITH CONTRAST TECHNIQUE: Multidetector CT imaging of the abdomen and pelvis was performed using the standard protocol following bolus administration of intravenous contrast. RADIATION DOSE REDUCTION: This exam was performed according to the departmental dose-optimization program which includes automated exposure control, adjustment of the mA and/or kV according to patient size and/or use of iterative reconstruction technique. CONTRAST:  175m OMNIPAQUE IOHEXOL 300 MG/ML  SOLN COMPARISON:  CT abdomen and pelvis 12/02/2022 FINDINGS: Lower chest: Chronic fibrosis or scarring in the lung bases. No pleural effusion. Cardiomegaly and coronary atherosclerosis. Hepatobiliary: Diffusely decreased attenuation of the liver suggestive of steatosis. Mildly lobular liver contour without definite findings of cirrhosis. Unremarkable gallbladder. No biliary dilatation. Pancreas: Fatty atrophy, greatest in the pancreatic head and neck region. No ductal dilatation or acute inflammation. Spleen: Unremarkable. Adrenals/Urinary Tract: Mild chronic thickening of the adrenal  glands. Bilateral renal atrophy. 18 mm cyst in the interpolar left kidney for which no follow-up imaging is recommended. No renal calculi or hydronephrosis. Unremarkable bladder. Stomach/Bowel: The stomach is unremarkable. There are multiple dilated loops of small bowel with air-fluid levels measuring up to 4.6 cm in diameter in the central abdomen and right upper quadrant. A small bowel loop anteriorly in the right mid abdomen demonstrates wall thickening and hyperenhancement, and adjacent inflammatory changes track into the central mesentery. The distal small bowel is decompressed, with suspected transition in the central abdomen. Numerous small bowel diverticula are again noted. There is also widespread colonic diverticulosis. An annular, a suspected mass in the proximal ascending colon is unchanged. The appendix is unremarkable. Vascular/Lymphatic: Abdominal aortic atherosclerosis without aneurysm. No enlarged lymph nodes. Reproductive: Small or absent prostate. Other: Trace pelvic free fluid. No organized fluid collection. No pneumoperitoneum. Musculoskeletal: No acute osseous abnormality or suspicious osseous lesion. IMPRESSION: 1. Recurrent small bowel obstruction with suspected transition in the central abdomen. Associated inflammation of a small bowel loop in the right mid abdomen. 2. Unchanged suspected mass in the ascending colon. 3.  Aortic Atherosclerosis (ICD10-I70.0). Electronically Signed   By: ALogan BoresM.D.   On: 12/09/2022 15:36    ROS:  As stated above in the HPI otherwise negative.  Blood pressure 128/71, pulse (!) 58, temperature 97.8 F (36.6 C), temperature source Oral, resp. rate 18, height 6' (1.829 m), weight 89.4 kg, SpO2 98 %.    PE: Gen: NAD, Alert and Oriented HEENT:  [place]/AT, EOMI Neck: Supple, no LAD Lungs: CTA Bilaterally CV: RRR without M/G/R ABD: Soft, NTND, +BS, mildly tympanic Ext: No C/C/E  Assessment/Plan: 1) Hematochezia. 2) Anemia. 3) Ascending colon  mass. 4) Diverticula. 5) SBO - resolved by KUB reading.  Clinically he appears resolved.   A colonoscopy will be performed.  He was already scheduled for an outpatient procedure with Dr. MCollene Mareson 12/21/2022.    Plan: 1) Colonoscopy tomorrow.  Ian Estes D 12/10/2022, 3:52 PM

## 2022-12-10 NOTE — Plan of Care (Signed)
  Problem: Clinical Measurements: Goal: Ability to maintain clinical measurements within normal limits will improve Outcome: Progressing Goal: Will remain free from infection Outcome: Progressing   Problem: Safety: Goal: Ability to remain free from injury will improve Outcome: Progressing

## 2022-12-10 NOTE — Progress Notes (Addendum)
PROGRESS NOTE    Ian Estes  [id number] DOB: 11-11-1940 DOA: 12/09/2022 PCP: Aletha Halim., PA-C     Brief Narrative:  Ian Estes is a 82 y.o. male with medical history significant of CKD stage III, A-fib on Eliquis, hypertension, hyperlipidemia who presents with abdominal pain and nausea.   He was admitted from 1/7 to 1/12 secondary to acute sigmoid diverticulitis with contained perforation/abscess.   He was also admitted again 1/21 to 1/25 secondary to recurrent diverticulitis.   Admitted again 2/14 to 2/16 secondary to small bowel obstruction.  At that time, his small bowel obstruction resolved with conservative measures.  He was recommended for outpatient colonoscopy.   He now returns with recurrent abdominal pain.  He states that he has been tolerating diet at home, abdominal pain started yesterday.  Significantly worsened today.  Denies any vomiting.  No other issues such as fevers, shortness of breath, chest pain or cough.  His last bowel movement was today, had a small amount.  He was admitted 2/21 for recurrent SBO.  General surgery was consulted.  New events last 24 hours / Subjective: Patient wanting to get his NG tube out.  States that he has no further abdominal pain.  Had bloody bowel movement this morning.  Assessment & Plan:  Principal Problem:   SBO (small bowel obstruction) (HCC) Active Problems:   Chronic atrial fibrillation (HCC)   Chronic kidney disease, stage 3a (HCC)   Hypertension   Colonic mass   Hyperkalemia   Recurrent small bowel obstruction, recent history of diverticulitis -General surgery following -SBO improving  Ascending colon mass -Patient was scheduled for outpatient colonoscopy with Dr. Collene Mares 3/4 -Having bloody bowel movements today as well as blood loss anemia -Consult GI today, spoke with Dr. Collene Mares   Hyperkalemia -Resolved   Chronic A-fib -Hold metoprolol (due to bradycardia), Eliquis (last dose 2/21 AM)     DVT  prophylaxis:  SCDs Start: 12/09/22 1744  Code Status: DNR Family Communication: No family at bedside Disposition Plan:  Status is: Inpatient Remains inpatient appropriate because: SBO and GI bleeding   Consultants:  General surgery GI  Antimicrobials:  Anti-infectives (From admission, onward)    None        Objective: Vitals:   12/09/22 1730 12/09/22 2109 12/10/22 0131 12/10/22 0528  BP: 131/66 129/65 121/61 116/75  Pulse: (!) 56 (!) 59 (!) 54 (!) 57  Resp: 18 20 20 20  $ Temp:  98.2 F (36.8 C) 97.8 F (36.6 C) 98 F (36.7 C)  TempSrc:  Oral Oral Oral  SpO2: 100% 97% 99% 100%  Weight:      Height:        Intake/Output Summary (Last 24 hours) at 12/10/2022 1040 Last data filed at 12/10/2022 0715 Gross per 24 hour  Intake 766.69 ml  Output 425 ml  Net 341.69 ml   Filed Weights   12/09/22 1415  Weight: 89.4 kg    Examination:  General exam: Appears calm and comfortable  Respiratory system: Clear to auscultation. Respiratory effort normal. No respiratory distress. No conversational dyspnea.  Cardiovascular system: S1 & S2 heard, Bradycardic rate. No murmurs. No pedal edema. Gastrointestinal system: Abdomen is nondistended, soft and nontender. Normal bowel sounds heard. Central nervous system: Alert and oriented. No focal neurological deficits. Speech clear.  Extremities: Symmetric in appearance  Skin: No rashes, lesions or ulcers on exposed skin  Psychiatry: Judgement and insight appear normal. Mood & affect appropriate.   Data Reviewed: I have personally reviewed following labs  and imaging studies  CBC: Recent Labs  Lab 12/04/22 0402 12/09/22 1307 12/09/22 1326 12/10/22 0523  WBC 7.2 8.8  --  6.8  NEUTROABS  --  6.0  --   --   HGB 9.9* 11.3* 12.9* 9.9*  HCT 32.5* 36.7* 38.0* 31.7*  MCV 86.0 85.3  --  86.1  PLT 244 306  --  [id number]   Basic Metabolic Panel: Recent Labs  Lab 12/04/22 0402 12/09/22 1307 12/09/22 1326 12/09/22 1812 12/10/22 0034  12/10/22 0523  NA 137 136 134* 134*  --  135  K 4.1 4.4 5.8* 4.5 4.5 5.1  CL 108 102 100 102  --  103  CO2 21* 25  --  22  --  22  GLUCOSE 94 139* 136* 99  --  91  BUN 12 13 14 12  $ --  11  CREATININE 1.00 1.11 1.00 1.08  --  1.23  CALCIUM 8.2* 8.9  --  8.8*  --  8.6*   GFR: Estimated Creatinine Clearance: 50.8 mL/min (by C-G formula based on SCr of 1.23 mg/dL). Liver Function Tests: Recent Labs  Lab 12/09/22 1307 12/10/22 0523  AST 27 29  ALT 16 13  ALKPHOS 145* 124  BILITOT 0.5 0.8  PROT 6.7 5.8*  ALBUMIN 3.1* 2.6*   Recent Labs  Lab 12/09/22 1307  LIPASE 36   No results for input(s): "AMMONIA" in the last 168 hours. Coagulation Profile: No results for input(s): "INR", "PROTIME" in the last 168 hours. Cardiac Enzymes: No results for input(s): "CKTOTAL", "CKMB", "CKMBINDEX", "TROPONINI" in the last 168 hours. BNP (last 3 results) No results for input(s): "PROBNP" in the last 8760 hours. HbA1C: No results for input(s): "HGBA1C" in the last 72 hours. CBG: Recent Labs  Lab 12/03/22 1648 12/03/22 2021 12/04/22 0015 12/04/22 0353 12/04/22 0753  GLUCAP 82 93 107* 91 111*   Lipid Profile: No results for input(s): "CHOL", "HDL", "LDLCALC", "TRIG", "CHOLHDL", "LDLDIRECT" in the last 72 hours. Thyroid Function Tests: No results for input(s): "TSH", "T4TOTAL", "FREET4", "T3FREE", "THYROIDAB" in the last 72 hours. Anemia Panel: No results for input(s): "VITAMINB12", "FOLATE", "FERRITIN", "TIBC", "IRON", "RETICCTPCT" in the last 72 hours. Sepsis Labs: No results for input(s): "PROCALCITON", "LATICACIDVEN" in the last 168 hours.  No results found for this or any previous visit (from the past 240 hour(s)).    Radiology Studies: DG Abd Portable 1V-Small Bowel Obstruction Protocol-initial, 8 hr delay  Result Date: 12/10/2022 CLINICAL DATA:  82 year old male with abdominal pain, CT appearance of small-bowel obstruction yesterday. 8 hours status post oral contrast  administration yesterday. EXAM: PORTABLE ABDOMEN - 1 VIEW COMPARISON:  Abdominal radiographs yesterday 2052 hours and earlier. FINDINGS: Portable AP supine view at 0734 hours. Oral contrast appears to be primarily within redundant colon, including the rectum. Superimposed excreted IV contrast in the urinary bladder is also possible. Bowel-gas pattern compared to the CT scout view yesterday is improved. Mild residual gas distended small bowel loops. Stable visualized osseous structures. Pelvic sidewall surgical clips. IMPRESSION: Evidence of improved or resolved small bowel obstruction with administered oral contrast primarily within the colon. Mild residual gas distended small bowel loops. Electronically Signed   By: Genevie Ann M.D.   On: 12/10/2022 07:55   DG Abd 1 View  Result Date: 12/09/2022 CLINICAL DATA:  Check gastric catheter placement EXAM: ABDOMEN - 1 VIEW COMPARISON:  Film from earlier in the same day. FINDINGS: Gastric catheter has been advanced slightly deeper into the stomach. Proximal side port now lies in  the gastric lumen. IMPRESSION: Gastric catheter has been advanced and now lies in satisfactory position. Electronically Signed   By: Inez Catalina M.D.   On: 12/09/2022 23:02   DG Abd Portable 1V-Small Bowel Protocol-Position Verification  Result Date: 12/09/2022 CLINICAL DATA:  Check gastric catheter placement EXAM: PORTABLE ABDOMEN - 1 VIEW COMPARISON:  CT from earlier in the same day. FINDINGS: Gastric catheter is noted with the tip in the stomach. Proximal side port however is noted in the distal esophagus. This could be advanced several cm deeper into the stomach. Persistent small bowel dilatation is noted in the right hemiabdomen. IMPRESSION: Gastric catheter as described. This should be advanced deeper into the stomach. Electronically Signed   By: Inez Catalina M.D.   On: 12/09/2022 19:03   CT ABDOMEN PELVIS W CONTRAST  Result Date: 12/09/2022 CLINICAL DATA:  Abdominal pain, acute,  nonlocalized. History of small-bowel obstruction. EXAM: CT ABDOMEN AND PELVIS WITH CONTRAST TECHNIQUE: Multidetector CT imaging of the abdomen and pelvis was performed using the standard protocol following bolus administration of intravenous contrast. RADIATION DOSE REDUCTION: This exam was performed according to the departmental dose-optimization program which includes automated exposure control, adjustment of the mA and/or kV according to patient size and/or use of iterative reconstruction technique. CONTRAST:  122m OMNIPAQUE IOHEXOL 300 MG/ML  SOLN COMPARISON:  CT abdomen and pelvis 12/02/2022 FINDINGS: Lower chest: Chronic fibrosis or scarring in the lung bases. No pleural effusion. Cardiomegaly and coronary atherosclerosis. Hepatobiliary: Diffusely decreased attenuation of the liver suggestive of steatosis. Mildly lobular liver contour without definite findings of cirrhosis. Unremarkable gallbladder. No biliary dilatation. Pancreas: Fatty atrophy, greatest in the pancreatic head and neck region. No ductal dilatation or acute inflammation. Spleen: Unremarkable. Adrenals/Urinary Tract: Mild chronic thickening of the adrenal glands. Bilateral renal atrophy. 18 mm cyst in the interpolar left kidney for which no follow-up imaging is recommended. No renal calculi or hydronephrosis. Unremarkable bladder. Stomach/Bowel: The stomach is unremarkable. There are multiple dilated loops of small bowel with air-fluid levels measuring up to 4.6 cm in diameter in the central abdomen and right upper quadrant. A small bowel loop anteriorly in the right mid abdomen demonstrates wall thickening and hyperenhancement, and adjacent inflammatory changes track into the central mesentery. The distal small bowel is decompressed, with suspected transition in the central abdomen. Numerous small bowel diverticula are again noted. There is also widespread colonic diverticulosis. An annular, a suspected mass in the proximal ascending colon is  unchanged. The appendix is unremarkable. Vascular/Lymphatic: Abdominal aortic atherosclerosis without aneurysm. No enlarged lymph nodes. Reproductive: Small or absent prostate. Other: Trace pelvic free fluid. No organized fluid collection. No pneumoperitoneum. Musculoskeletal: No acute osseous abnormality or suspicious osseous lesion. IMPRESSION: 1. Recurrent small bowel obstruction with suspected transition in the central abdomen. Associated inflammation of a small bowel loop in the right mid abdomen. 2. Unchanged suspected mass in the ascending colon. 3.  Aortic Atherosclerosis (ICD10-I70.0). Electronically Signed   By: ALogan BoresM.D.   On: 12/09/2022 15:36      Scheduled Meds:  furosemide  40 mg Intravenous Once   ondansetron (ZOFRAN) IV  4 mg Intravenous Once   sodium chloride flush  3 mL Intravenous Q12H   Continuous Infusions:  sodium chloride 75 mL/hr at 12/10/22 0047     LOS: 1 day   Time spent: 35 minutes   JDessa Phi DO [facility] 12/10/2022, 10:40 AM   Available via Epic secure chat 7am-7pm After these hours, please refer to coverage provider listed on  [email or web address]

## 2022-12-10 NOTE — TOC Initial Note (Signed)
Transition of Care [facility]) - Initial/Assessment Note    Patient Details  Name: Ian Estes MRN: [id number] Date of Birth: 14-Oct-1941  Transition of Care [facility]) CM/SW Contact:    Dessa Phi, RN Phone Number: 12/10/2022, 10:45 AM  Clinical Narrative: Monitor for d/c plans.                  Expected Discharge Plan: Home/Self Care Barriers to Discharge: Continued Medical Work up   Patient Goals and CMS Choice Patient states their goals for this hospitalization and ongoing recovery are::  (Home) CMS Medicare.gov Compare Post Acute Care list provided to:: Patient Choice offered to / list presented to : Patient [name] ownership interest in [facility].provided to:: Patient    Expected Discharge Plan and Services       Living arrangements for the past 2 months: [place]                                      Prior Living Arrangements/Services Living arrangements for the past 2 months: [place] of Daily Living Home Assistive Devices/Equipment: Eyeglasses, Grab bars around toilet, Shower chair with back ADL Screening (condition at time of admission) Patient's cognitive ability adequate to safely complete daily activities?: Yes Is the patient deaf or have difficulty hearing?: No Does the patient have difficulty seeing, even when wearing glasses/contacts?: No Does the patient have difficulty concentrating, remembering, or making decisions?: No Patient able to express need for assistance with ADLs?: Yes Does the patient have difficulty dressing or bathing?: No Independently performs ADLs?: Yes (appropriate for developmental age) Does the patient have difficulty walking or climbing stairs?: No Weakness of Legs: None Weakness of Arms/Hands: None  Permission Sought/Granted                  Emotional Assessment              Admission diagnosis:  Small bowel obstruction ([place])  [K56.609] SBO (small bowel obstruction) ([place]) [K56.609] Patient Active Problem List   Diagnosis Date Noted   Hyperkalemia 12/09/2022   Malnutrition of moderate degree 12/03/2022   Chronic anemia 12/02/2022   Hypotension 12/02/2022   SBO (small bowel obstruction) ([place]) 12/02/2022   Acute diverticulitis 11/08/2022   GERD (gastroesophageal reflux disease) 11/08/2022   Gout 11/08/2022   Anxiety 11/08/2022   Colonic mass 11/08/2022   Diverticulitis of intestine with perforation and abscess 10/25/2022   Chronic kidney disease, stage 3a ([place]) 10/25/2022   Educated about COVID-19 virus infection 06/10/2020   S/P pericardial window creation 12/21/2018   Chest pain 12/20/2018   Chronic atrial fibrillation ([place]) 12/20/2018   Pericardial effusion 12/20/2018   Chronic abdominal pain 06/13/2014   Hyperlipidemia 06/13/2014   Acute renal failure ([place]) 06/13/2014   Dehydration 06/13/2014   Diabetes mellitus without complication ([place])    Hypertension    PCP:  Aletha Halim., PA-C Pharmacy:   [id number] [place] [facility] [address] [place] [address]Phone: [phone]: [phone]    Social Determinants of Health (SDOH) Social History: SDOH Screenings   Food Insecurity: No Food Insecurity (12/09/2022)  Housing: Low Risk  (12/09/2022)  Transportation Needs: No Transportation Needs (12/09/2022)  Utilities:  Not At Risk (12/09/2022)  Tobacco Use: Medium Risk (12/09/2022)   SDOH Interventions:     Readmission Risk Interventions    12/03/2022    1:56 PM 10/27/2022    9:24 AM  Readmission Risk Prevention Plan  Post Dischage Appt  Complete  Medication Screening  Complete  Transportation Screening Complete Complete  PCP or Specialist Appt within 5-7 Days Complete   Home Care Screening Complete   Medication Review (RN CM) Complete

## 2022-12-10 NOTE — H&P (View-Only) (Signed)
Reason for Consult: Ascending colon mass and hematochezia Referring Physician: [facility]  [name] HPI: This is an 82 year old male with a PMH of a contained perforated sigmoid diverticulitis (10/2022), history of colonic polyps, HTN, hyperlipidemia, and CKD admitted for a recurrent small bowel obstruction.  This is the second admission this month for an SBO and previously it resolved with conservative measures.  It appears that his SBO again resolved with an NG tube.  It is reported that he is passing flatus and he had some formed bowel movements today.  Todays's KUB shows that there is contrast in the colon and the SBO appears resolved.  He also experienced hematochezia and his HGB dropped from 12.9 down to 9.9 g/dL.  His baseline HGB ranges anywhere from 10-12 g/dL.  In 10/2022 he was admitted twice for a contained perforated sigmoid diverticulitis and a recurrence a couple of weeks later.  During the second January admission the CT scan showed that he had a mass in the ascending colon.  He had colonoscopies with Dr. Collene Mares in 2004, 2009, and 2014.  His colonoscopy in 2009 was the only procedure that showed a TA and an SSA.  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"   Diverticulitis    Gout    High cholesterol    Hypertension    Obesity    Pneumonia 2014 X 1   Prostate cancer ([place])    Type II diabetes mellitus ([place])     Past Surgical History:  Procedure Laterality Date   INCISION AND DRAINAGE PERIRECTAL ABSCESS  1990's   "hemorrhoid"   KNEE ARTHROSCOPY Right ~ 2011   PROSTATE SURGERY  1996   SKIN CANCER EXCISION  ~ 2005 X 2   "left foot"   SUBXYPHOID PERICARDIAL WINDOW N/A 12/21/2018   Procedure: SUBXYPHOID PERICARDIAL WINDOW;  Surgeon: Ivin Poot, MD;  Location: [facility];  Service: Thoracic;  Laterality: N/A;   TEE WITHOUT CARDIOVERSION N/A 12/21/2018    Procedure: TRANSESOPHAGEAL ECHOCARDIOGRAM (TEE);  Surgeon: Prescott Gum, Collier Salina, MD;  Location: [facility] OR;  Service: Thoracic;  Laterality: N/A;    Family History  Problem Relation Age of Onset   CAD Neg Hx     Social History:  reports that he quit smoking about 35 years ago. His smoking use included cigarettes. He has a 48.00 pack-year smoking history. He has never used smokeless tobacco. He reports current alcohol use of about 21.0 standard drinks of alcohol per week. He reports that he does not use drugs.  Allergies: No Known Allergies  Medications: Scheduled:  furosemide  40 mg Intravenous Once   ondansetron (ZOFRAN) IV  4 mg Intravenous Once   polyethylene glycol-electrolytes  4,000 mL Oral Once   sodium chloride flush  3 mL Intravenous Q12H   Continuous:  sodium chloride 75 mL/hr at 12/10/22 1301    Results for orders placed or performed during the hospital encounter of 12/09/22 (from the past 24 hour(s))  Basic metabolic panel     Status: Abnormal   Collection Time: 12/09/22  6:12 PM  Result Value Ref Range   Sodium 134 (L) 135 - 145 mmol/L   Potassium 4.5 3.5 - 5.1 mmol/L   Chloride 102 98 - 111 mmol/L   CO2 22 22 - 32 mmol/L   Glucose, Bld 99 70 - 99 mg/dL   BUN  12 8 - 23 mg/dL   Creatinine, Ser 1.08 0.61 - 1.24 mg/dL   Calcium 8.8 (L) 8.9 - 10.3 mg/dL   GFR, Estimated >60 >60 mL/min   Anion gap 10 5 - 15  Potassium     Status: None   Collection Time: 12/10/22 12:34 AM  Result Value Ref Range   Potassium 4.5 3.5 - 5.1 mmol/L  CBC     Status: Abnormal   Collection Time: 12/10/22  5:23 AM  Result Value Ref Range   WBC 6.8 4.0 - 10.5 K/uL   RBC 3.68 (L) 4.22 - 5.81 MIL/uL   Hemoglobin 9.9 (L) 13.0 - 17.0 g/dL   HCT 31.7 (L) 39.0 - 52.0 %   MCV 86.1 80.0 - 100.0 fL   MCH 26.9 26.0 - 34.0 pg   MCHC 31.2 30.0 - 36.0 g/dL   RDW 16.6 (H) 11.5 - 15.5 %   Platelets 221 150 - 400 K/uL   nRBC 0.0 0.0 - 0.2 %  Comprehensive metabolic panel     Status: Abnormal   Collection  Time: 12/10/22  5:23 AM  Result Value Ref Range   Sodium 135 135 - 145 mmol/L   Potassium 5.1 3.5 - 5.1 mmol/L   Chloride 103 98 - 111 mmol/L   CO2 22 22 - 32 mmol/L   Glucose, Bld 91 70 - 99 mg/dL   BUN 11 8 - 23 mg/dL   Creatinine, Ser 1.23 0.61 - 1.24 mg/dL   Calcium 8.6 (L) 8.9 - 10.3 mg/dL   Total Protein 5.8 (L) 6.5 - 8.1 g/dL   Albumin 2.6 (L) 3.5 - 5.0 g/dL   AST 29 15 - 41 U/L   ALT 13 0 - 44 U/L   Alkaline Phosphatase 124 38 - 126 U/L   Total Bilirubin 0.8 0.3 - 1.2 mg/dL   GFR, Estimated 59 (L) >60 mL/min   Anion gap 10 5 - 15     DG Abd Portable 1V-Small Bowel Obstruction Protocol-initial, 8 hr delay  Result Date: 12/10/2022 CLINICAL DATA:  82 year old male with abdominal pain, CT appearance of small-bowel obstruction yesterday. 8 hours status post oral contrast administration yesterday. EXAM: PORTABLE ABDOMEN - 1 VIEW COMPARISON:  Abdominal radiographs yesterday 2052 hours and earlier. FINDINGS: Portable AP supine view at 0734 hours. Oral contrast appears to be primarily within redundant colon, including the rectum. Superimposed excreted IV contrast in the urinary bladder is also possible. Bowel-gas pattern compared to the CT scout view yesterday is improved. Mild residual gas distended small bowel loops. Stable visualized osseous structures. Pelvic sidewall surgical clips. IMPRESSION: Evidence of improved or resolved small bowel obstruction with administered oral contrast primarily within the colon. Mild residual gas distended small bowel loops. Electronically Signed   By: Genevie Ann M.D.   On: 12/10/2022 07:55   DG Abd 1 View  Result Date: 12/09/2022 CLINICAL DATA:  Check gastric catheter placement EXAM: ABDOMEN - 1 VIEW COMPARISON:  Film from earlier in the same day. FINDINGS: Gastric catheter has been advanced slightly deeper into the stomach. Proximal side port now lies in the gastric lumen. IMPRESSION: Gastric catheter has been advanced and now lies in satisfactory position.  Electronically Signed   By: Inez Catalina M.D.   On: 12/09/2022 23:02   DG Abd Portable 1V-Small Bowel Protocol-Position Verification  Result Date: 12/09/2022 CLINICAL DATA:  Check gastric catheter placement EXAM: PORTABLE ABDOMEN - 1 VIEW COMPARISON:  CT from earlier in the same day. FINDINGS: Gastric catheter is noted with  the tip in the stomach. Proximal side port however is noted in the distal esophagus. This could be advanced several cm deeper into the stomach. Persistent small bowel dilatation is noted in the right hemiabdomen. IMPRESSION: Gastric catheter as described. This should be advanced deeper into the stomach. Electronically Signed   By: Inez Catalina M.D.   On: 12/09/2022 19:03   CT ABDOMEN PELVIS W CONTRAST  Result Date: 12/09/2022 CLINICAL DATA:  Abdominal pain, acute, nonlocalized. History of small-bowel obstruction. EXAM: CT ABDOMEN AND PELVIS WITH CONTRAST TECHNIQUE: Multidetector CT imaging of the abdomen and pelvis was performed using the standard protocol following bolus administration of intravenous contrast. RADIATION DOSE REDUCTION: This exam was performed according to the departmental dose-optimization program which includes automated exposure control, adjustment of the mA and/or kV according to patient size and/or use of iterative reconstruction technique. CONTRAST:  125m OMNIPAQUE IOHEXOL 300 MG/ML  SOLN COMPARISON:  CT abdomen and pelvis 12/02/2022 FINDINGS: Lower chest: Chronic fibrosis or scarring in the lung bases. No pleural effusion. Cardiomegaly and coronary atherosclerosis. Hepatobiliary: Diffusely decreased attenuation of the liver suggestive of steatosis. Mildly lobular liver contour without definite findings of cirrhosis. Unremarkable gallbladder. No biliary dilatation. Pancreas: Fatty atrophy, greatest in the pancreatic head and neck region. No ductal dilatation or acute inflammation. Spleen: Unremarkable. Adrenals/Urinary Tract: Mild chronic thickening of the adrenal  glands. Bilateral renal atrophy. 18 mm cyst in the interpolar left kidney for which no follow-up imaging is recommended. No renal calculi or hydronephrosis. Unremarkable bladder. Stomach/Bowel: The stomach is unremarkable. There are multiple dilated loops of small bowel with air-fluid levels measuring up to 4.6 cm in diameter in the central abdomen and right upper quadrant. A small bowel loop anteriorly in the right mid abdomen demonstrates wall thickening and hyperenhancement, and adjacent inflammatory changes track into the central mesentery. The distal small bowel is decompressed, with suspected transition in the central abdomen. Numerous small bowel diverticula are again noted. There is also widespread colonic diverticulosis. An annular, a suspected mass in the proximal ascending colon is unchanged. The appendix is unremarkable. Vascular/Lymphatic: Abdominal aortic atherosclerosis without aneurysm. No enlarged lymph nodes. Reproductive: Small or absent prostate. Other: Trace pelvic free fluid. No organized fluid collection. No pneumoperitoneum. Musculoskeletal: No acute osseous abnormality or suspicious osseous lesion. IMPRESSION: 1. Recurrent small bowel obstruction with suspected transition in the central abdomen. Associated inflammation of a small bowel loop in the right mid abdomen. 2. Unchanged suspected mass in the ascending colon. 3.  Aortic Atherosclerosis (ICD10-I70.0). Electronically Signed   By: ALogan BoresM.D.   On: 12/09/2022 15:36    ROS:  As stated above in the HPI otherwise negative.  Blood pressure 128/71, pulse (!) 58, temperature 97.8 F (36.6 C), temperature source Oral, resp. rate 18, height 6' (1.829 m), weight 89.4 kg, SpO2 98 %.    PE: Gen: NAD, Alert and Oriented HEENT:  [place]/AT, EOMI Neck: Supple, no LAD Lungs: CTA Bilaterally CV: RRR without M/G/R ABD: Soft, NTND, +BS, mildly tympanic Ext: No C/C/E  Assessment/Plan: 1) Hematochezia. 2) Anemia. 3) Ascending colon  mass. 4) Diverticula. 5) SBO - resolved by KUB reading.  Clinically he appears resolved.   A colonoscopy will be performed.  He was already scheduled for an outpatient procedure with Dr. MCollene Mareson 12/21/2022.    Plan: 1) Colonoscopy tomorrow.  Tonnette Zwiebel D 12/10/2022, 3:52 PM

## 2022-12-10 NOTE — TOC Initial Note (Signed)
Transition of Care [facility]) - Initial/Assessment Note    Patient Details  Name: Ian Estes MRN: [id number] Date of Birth: [birth year]  [facility]) CM/SW Contact:    Dessa Phi, RN Phone Number: 12/10/2022, 3:24 PM  Clinical Narrative: already active w/[facility] resume orders if appropriate rep Brandi/Kelly following.                  Expected Discharge Plan: [place] Barriers to Discharge: Continued Medical Work up   Patient Goals and CMS Choice Patient states their goals for this hospitalization and ongoing recovery are::  (Home) CMS Medicare.gov Compare Post Acute Care list provided to:: Patient Choice offered to / list presented to : Patient [place] ownership interest in [facility].provided to:: Patient    Expected Discharge Plan and Services       Living arrangements for the past 2 months: [place]                                      Prior Living Arrangements/Services Living arrangements for the past 2 months: Single Family Home                Current home services: Home PT (Active w/[facility].)    Activities of Daily Living Home Assistive Devices/Equipment: Eyeglasses, Grab bars around toilet, Shower chair with back ADL Screening (condition at time of admission) Patient's cognitive ability adequate to safely complete daily activities?: Yes Is the patient deaf or have difficulty hearing?: No Does the patient have difficulty seeing, even when wearing glasses/contacts?: No Does the patient have difficulty concentrating, remembering, or making decisions?: No Patient able to express need for assistance with ADLs?: Yes Does the patient have difficulty dressing or bathing?: No Independently performs ADLs?: Yes (appropriate for developmental age) Does the patient have difficulty walking or climbing stairs?: No Weakness of Legs: None Weakness of Arms/Hands: None  Permission  Sought/Granted                  Emotional Assessment              Admission diagnosis:  Small bowel obstruction ([place]) [K56.609] SBO (small bowel obstruction) ([place]) [K56.609] Patient Active Problem List   Diagnosis Date Noted   Hyperkalemia 12/09/2022   Malnutrition of moderate degree 12/03/2022   Chronic anemia 12/02/2022   Hypotension 12/02/2022   SBO (small bowel obstruction) ([place]) 12/02/2022   Acute diverticulitis 11/08/2022   GERD (gastroesophageal reflux disease) 11/08/2022   Gout 11/08/2022   Anxiety 11/08/2022   Colonic mass 11/08/2022   Diverticulitis of intestine with perforation and abscess 10/25/2022   Chronic kidney disease, stage 3a ([place]) 10/25/2022   Educated about COVID-19 virus infection 06/10/2020   S/P pericardial window creation 12/21/2018   Chest pain 12/20/2018   Chronic atrial fibrillation ([place]) 12/20/2018   Pericardial effusion 12/20/2018   Chronic abdominal pain 06/13/2014   Hyperlipidemia 06/13/2014   Acute renal failure ([place]) 06/13/2014   Dehydration 06/13/2014   Diabetes mellitus without complication ([place])    Hypertension    PCP:  Aletha Halim., PA-C [facility]acy[facility]   [id number] [place] [address] [place] [address]Phone: [phone]: [phone]    Social Determinants of Health (SDOH) Social History: SDOH Screenings   Food Insecurity: No Food Insecurity (12/09/2022)  Housing: Low Risk  (12/09/2022)  Transportation Needs: No Transportation Needs (12/09/2022)  Utilities: Not At Risk (12/09/2022)  Tobacco Use: Medium Risk (12/09/2022)   SDOH Interventions:     Readmission Risk Interventions    12/03/2022    1:56 PM 10/27/2022    9:24 AM  Readmission Risk Prevention Plan  Post Dischage Appt  Complete  Medication Screening  Complete  Transportation Screening Complete Complete  PCP or Specialist Appt within 5-7 Days Complete   Home  Care Screening Complete   Medication Review (RN CM) Complete

## 2022-12-10 NOTE — Anesthesia Preprocedure Evaluation (Addendum)
Anesthesia Evaluation  Patient identified by MRN, date of birth, ID band Patient awake    Reviewed: Allergy & Precautions, NPO status , Patient's Chart, lab work & pertinent test results  History of Anesthesia Complications Negative for: history of anesthetic complications  Airway Mallampati: III  TM Distance: >3 FB Neck ROM: Full   Comment: Previous grade I view with Miller 3, mask with [facility]  (+) Dental Advisory Given, Poor Dentition Missing several teeth. Denies loose teeth.:   Pulmonary neg shortness of breath, neg sleep apnea, neg COPD, neg recent URI, former smoker   Pulmonary exam normal breath sounds clear to auscultation       Cardiovascular hypertension (metoprolol), Pt. on home beta blockers (-) angina (-) Past MI, (-) Cardiac Stents and (-) CABG + dysrhythmias (on Eliquis) Atrial Fibrillation  Rhythm:Regular Rate:Normal  HLD  TTE 12/20/2018: IMPRESSIONS     1. The left ventricle has normal systolic function with an ejection  fraction of 60-65%. The cavity size was normal. Left ventricular diastolic  Doppler parameters are indeterminate secondary to atrial fibrillation.   2. The right ventricle has normal systolic function. The cavity was  normal. There is no increase in right ventricular wall thickness.   3. Large pleural effusion.   4. There is excessive respiratory variation in the tricuspid valve  spectral Doppler velocities.   5. Large pericardial effusion measuring up to 2.5 cm anteriorly. There  appears to be a large thrombus anterior to RV. Cannot rule out a prominent  fat pad. However, there is free-flowing fluid both anterior and posterior,  making thrombus more likely.  There is no evidence of RA or RV diastolic collapse, but the IVC is  dilated and does not collapse. There is significant tricuspid valve inflow  variation. Mitral inflow pattern is unremarkable. Findings concerning for  early  tamponade.   6. The mitral valve is normal in structure.   7. The tricuspid valve is normal in structure.   8. The aortic valve was not well visualized.   9. The pulmonic valve was normal in structure.  10. The inferior vena cava was dilated in size with <50% respiratory  variability.     Neuro/Psych  PSYCHIATRIC DISORDERS Anxiety        GI/Hepatic Neg liver ROS,GERD  ,,  Endo/Other  diabetes, Type 2    Renal/GU CRFRenal disease     Musculoskeletal  (+) Arthritis ,    Abdominal   Peds  Hematology  (+) Blood dyscrasia, anemia   Anesthesia Other Findings Gout, h/o prostate cancer  82 year old male with a PMH of a contained perforated sigmoid diverticulitis (10/2022), history of colonic polyps, HTN, hyperlipidemia, and CKD admitted for a recurrent small bowel obstruction.  This is the second admission this month for an SBO and previously it resolved with conservative measures.  It appears that his SBO again resolved with an NG tube.  It is reported that he is passing flatus and he had some formed bowel movements today.  Todays's KUB shows that there is contrast in the colon and the SBO appears resolved.  He also experienced hematochezia and his HGB dropped from 12.9 down to 9.9 g/dL.  His baseline HGB ranges anywhere from 10-12 g/dL.  In 10/2022 he was admitted twice for a contained perforated sigmoid diverticulitis and a recurrence a couple of weeks later.  During the second January admission the CT scan showed that he had a mass in the ascending colon.     Reproductive/Obstetrics  Anesthesia Physical Anesthesia Plan  ASA: 3  Anesthesia Plan: MAC   Post-op Pain Management:    Induction: Intravenous  PONV Risk Score and Plan: 1 and Propofol infusion and Treatment may vary due to age or medical condition  Airway Management Planned: Natural Airway and Nasal Cannula  Additional Equipment:   Intra-op Plan:    Post-operative Plan:   Informed Consent: I have reviewed the patients History and Physical, chart, labs and discussed the procedure including the risks, benefits and alternatives for the proposed anesthesia with the patient or authorized representative who has indicated his/her understanding and acceptance.   Patient has DNR.  Discussed DNR with patient and Suspend DNR.     Plan Discussed with: Anesthesiologist and CRNA  Anesthesia Plan Comments: (Discussed with patient risks of MAC including, but not limited to, minor pain or discomfort, hearing people in the room, and possible need for backup general anesthesia. Risks for general anesthesia also discussed including, but not limited to, sore throat, hoarse voice, chipped/damaged teeth, injury to vocal cords, nausea and vomiting, allergic reactions, lung infection, heart attack, stroke, and death. All questions answered. )       Anesthesia Quick Evaluation

## 2022-12-10 NOTE — Progress Notes (Signed)
Subjective: CC: Feeling much better. No abdominal pain, bloating, or nausea since last night. Last pain medication was at 2309 last night. NGT w/ 150cc since placement. Passing flatus. He had 2 bm's this am with some formed stool but mainly BRB per RN. Hgb 12.9 > 9.9. Xray this am with contrast in the colon. Still some dilated small bowel loops but looks overall improved.   Objective: Vital signs in last 24 hours: Temp:  [97.6 F (36.4 C)-98.6 F (37 C)] 98 F (36.7 C) (02/22 0528) Pulse Rate:  [39-73] 57 (02/22 0528) Resp:  [13-20] 20 (02/22 0528) BP: (99-146)/(48-85) 116/75 (02/22 0528) SpO2:  [97 %-100 %] 100 % (02/22 0528) Weight:  [89.4 kg] 89.4 kg (02/21 1415) Last BM Date : 12/09/22  Intake/Output from previous day: 02/21 0701 - 02/22 0700 In: 766.7 [I.V.:766.7] Out: 275 [Urine:275] Intake/Output this shift: Total I/O In: -  Out: 150 [Emesis/NG output:150]  PE: Gen:  Alert, NAD, pleasant Abd: Soft, ND, NT, +BS, NGT in place with dark brown liquid in cannister. Lower midline abdominal scar well healed.   Lab Results:  Recent Labs    12/09/22 1307 12/09/22 1326 12/10/22 0523  WBC 8.8  --  6.8  HGB 11.3* 12.9* 9.9*  HCT 36.7* 38.0* 31.7*  PLT 306  --  221   BMET Recent Labs    12/09/22 1812 12/10/22 0034 12/10/22 0523  NA 134*  --  135  K 4.5 4.5 5.1  CL 102  --  103  CO2 22  --  22  GLUCOSE 99  --  91  BUN 12  --  11  CREATININE 1.08  --  1.23  CALCIUM 8.8*  --  8.6*   PT/INR No results for input(s): "LABPROT", "INR" in the last 72 hours. CMP     Component Value Date/Time   NA 135 12/10/2022 0523   NA 138 01/12/2019 0852   K 5.1 12/10/2022 0523   CL 103 12/10/2022 0523   CO2 22 12/10/2022 0523   GLUCOSE 91 12/10/2022 0523   BUN 11 12/10/2022 0523   BUN 20 01/12/2019 0852   CREATININE 1.23 12/10/2022 0523   CALCIUM 8.6 (L) 12/10/2022 0523   PROT 5.8 (L) 12/10/2022 0523   ALBUMIN 2.6 (L) 12/10/2022 0523   AST 29 12/10/2022 0523    ALT 13 12/10/2022 0523   ALKPHOS 124 12/10/2022 0523   BILITOT 0.8 12/10/2022 0523   GFRNONAA 59 (L) 12/10/2022 0523   GFRAA 63 01/12/2019 0852   Lipase     Component Value Date/Time   LIPASE 36 12/09/2022 1307    Studies/Results: DG Abd Portable 1V-Small Bowel Obstruction Protocol-initial, 8 hr delay  Result Date: 12/10/2022 CLINICAL DATA:  82 year old male with abdominal pain, CT appearance of small-bowel obstruction yesterday. 8 hours status post oral contrast administration yesterday. EXAM: PORTABLE ABDOMEN - 1 VIEW COMPARISON:  Abdominal radiographs yesterday 2052 hours and earlier. FINDINGS: Portable AP supine view at 0734 hours. Oral contrast appears to be primarily within redundant colon, including the rectum. Superimposed excreted IV contrast in the urinary bladder is also possible. Bowel-gas pattern compared to the CT scout view yesterday is improved. Mild residual gas distended small bowel loops. Stable visualized osseous structures. Pelvic sidewall surgical clips. IMPRESSION: Evidence of improved or resolved small bowel obstruction with administered oral contrast primarily within the colon. Mild residual gas distended small bowel loops. Electronically Signed   By: Genevie Ann M.D.   On: 12/10/2022 07:55  DG Abd 1 View  Result Date: 12/09/2022 CLINICAL DATA:  Check gastric catheter placement EXAM: ABDOMEN - 1 VIEW COMPARISON:  Film from earlier in the same day. FINDINGS: Gastric catheter has been advanced slightly deeper into the stomach. Proximal side port now lies in the gastric lumen. IMPRESSION: Gastric catheter has been advanced and now lies in satisfactory position. Electronically Signed   By: Inez Catalina M.D.   On: 12/09/2022 23:02   DG Abd Portable 1V-Small Bowel Protocol-Position Verification  Result Date: 12/09/2022 CLINICAL DATA:  Check gastric catheter placement EXAM: PORTABLE ABDOMEN - 1 VIEW COMPARISON:  CT from earlier in the same day. FINDINGS: Gastric catheter is  noted with the tip in the stomach. Proximal side port however is noted in the distal esophagus. This could be advanced several cm deeper into the stomach. Persistent small bowel dilatation is noted in the right hemiabdomen. IMPRESSION: Gastric catheter as described. This should be advanced deeper into the stomach. Electronically Signed   By: Inez Catalina M.D.   On: 12/09/2022 19:03   CT ABDOMEN PELVIS W CONTRAST  Result Date: 12/09/2022 CLINICAL DATA:  Abdominal pain, acute, nonlocalized. History of small-bowel obstruction. EXAM: CT ABDOMEN AND PELVIS WITH CONTRAST TECHNIQUE: Multidetector CT imaging of the abdomen and pelvis was performed using the standard protocol following bolus administration of intravenous contrast. RADIATION DOSE REDUCTION: This exam was performed according to the departmental dose-optimization program which includes automated exposure control, adjustment of the mA and/or kV according to patient size and/or use of iterative reconstruction technique. CONTRAST:  179m OMNIPAQUE IOHEXOL 300 MG/ML  SOLN COMPARISON:  CT abdomen and pelvis 12/02/2022 FINDINGS: Lower chest: Chronic fibrosis or scarring in the lung bases. No pleural effusion. Cardiomegaly and coronary atherosclerosis. Hepatobiliary: Diffusely decreased attenuation of the liver suggestive of steatosis. Mildly lobular liver contour without definite findings of cirrhosis. Unremarkable gallbladder. No biliary dilatation. Pancreas: Fatty atrophy, greatest in the pancreatic head and neck region. No ductal dilatation or acute inflammation. Spleen: Unremarkable. Adrenals/Urinary Tract: Mild chronic thickening of the adrenal glands. Bilateral renal atrophy. 18 mm cyst in the interpolar left kidney for which no follow-up imaging is recommended. No renal calculi or hydronephrosis. Unremarkable bladder. Stomach/Bowel: The stomach is unremarkable. There are multiple dilated loops of small bowel with air-fluid levels measuring up to 4.6 cm in  diameter in the central abdomen and right upper quadrant. A small bowel loop anteriorly in the right mid abdomen demonstrates wall thickening and hyperenhancement, and adjacent inflammatory changes track into the central mesentery. The distal small bowel is decompressed, with suspected transition in the central abdomen. Numerous small bowel diverticula are again noted. There is also widespread colonic diverticulosis. An annular, a suspected mass in the proximal ascending colon is unchanged. The appendix is unremarkable. Vascular/Lymphatic: Abdominal aortic atherosclerosis without aneurysm. No enlarged lymph nodes. Reproductive: Small or absent prostate. Other: Trace pelvic free fluid. No organized fluid collection. No pneumoperitoneum. Musculoskeletal: No acute osseous abnormality or suspicious osseous lesion. IMPRESSION: 1. Recurrent small bowel obstruction with suspected transition in the central abdomen. Associated inflammation of a small bowel loop in the right mid abdomen. 2. Unchanged suspected mass in the ascending colon. 3.  Aortic Atherosclerosis (ICD10-I70.0). Electronically Signed   By: ALogan BoresM.D.   On: 12/09/2022 15:36    Anti-infectives: Anti-infectives (From admission, onward)    None        Assessment/Plan SBO, recurrent Patient with recent admission for diverticulitis in Jan 2024. During that admission he underwent IR drain placement. There was  concern that the drain migrated through SB lumen and the drain was removed 1/22. He was just admitted last week for SBO that was felt likely 2/2 to above (also hx of prior prostatectomy so possible of intra-abdominal adhesions). He improved and was able to be discahrged on 2/16. Returned 2/21 with likely partial sbo.  - SBO appears to be improved. Patient with resolution of symptoms, return of bowel function and contrast in colon on xray. Can d/c NGT and place on cld.  - We will continue to follow   Ascending colon mass ABL anemia  I  am most concerned about patient BRB per rectum with ABL anemia today (hgb 12.9 > 9.9). Patient has suspected mass in ascending colon on prior CT's. I am concerned he may have bleeding colon mass that is the source of ABL anemia. He is already arranged to have colonoscopy with Dr. Collene Mares on 3/4. Recommend consultation of Dr. Lorie Apley team here to see if they will perform colonoscopy while inpatient and further evaluate this. Cont to trend hgb. His VSS without tachycardia or hypotension this am.      FEN - D/c NGT, CLD, IVF per [facility] VTE - SCDs, hold chem ppx for abl anemia  ID - None  I reviewed nursing notes, hospitalist notes, last 24 h vitals and pain scores, last 48 h intake and output, last 24 h labs and trends, and last 24 h imaging results.   LOS: 1 day    Jillyn Ledger , [facility] 12/10/2022, 10:03 AM Please see Amion for pager number during day hours 7:00am-4:30pm

## 2022-12-11 ENCOUNTER — Inpatient Hospital Stay (HOSPITAL_COMMUNITY): Payer: Medicare HMO | Admitting: Anesthesiology

## 2022-12-11 ENCOUNTER — Encounter (HOSPITAL_COMMUNITY): Payer: Self-pay | Admitting: Internal Medicine

## 2022-12-11 ENCOUNTER — Encounter (HOSPITAL_COMMUNITY)
Admission: EM | Disposition: A | Discharge status: Skilled Nursing Facility | Payer: Self-pay | Source: Home / Self Care | Attending: Internal Medicine

## 2022-12-11 DIAGNOSIS — D124 Benign neoplasm of descending colon: Secondary | ICD-10-CM

## 2022-12-11 DIAGNOSIS — K5730 Diverticulosis of large intestine without perforation or abscess without bleeding: Secondary | ICD-10-CM

## 2022-12-11 DIAGNOSIS — Z87891 Personal history of nicotine dependence: Secondary | ICD-10-CM

## 2022-12-11 DIAGNOSIS — C182 Malignant neoplasm of ascending colon: Secondary | ICD-10-CM | POA: Diagnosis not present

## 2022-12-11 DIAGNOSIS — K56609 Unspecified intestinal obstruction, unspecified as to partial versus complete obstruction: Secondary | ICD-10-CM | POA: Diagnosis not present

## 2022-12-11 DIAGNOSIS — I4891 Unspecified atrial fibrillation: Secondary | ICD-10-CM

## 2022-12-11 HISTORY — PX: BIOPSY: SHX5522

## 2022-12-11 HISTORY — PX: COLONOSCOPY WITH PROPOFOL: SHX5780

## 2022-12-11 HISTORY — PX: POLYPECTOMY: SHX5525

## 2022-12-11 HISTORY — PX: SUBMUCOSAL TATTOO INJECTION: SHX6856

## 2022-12-11 LAB — BASIC METABOLIC PANEL
Anion gap: 8 (ref 5–15)
BUN: 9 mg/dL (ref 8–23)
CO2: 23 mmol/L (ref 22–32)
Calcium: 8.3 mg/dL — ABNORMAL LOW (ref 8.9–10.3)
Chloride: 103 mmol/L (ref 98–111)
Creatinine, Ser: 1.1 mg/dL (ref 0.61–1.24)
GFR, Estimated: >60 mL/min (ref >60)
Glucose, Bld: 100 mg/dL — ABNORMAL HIGH (ref 70–99)
Potassium: 4.2 mmol/L (ref 3.5–5.1)
Sodium: 134 mmol/L — ABNORMAL LOW (ref 135–145)

## 2022-12-11 LAB — CBC
HCT: 31.3 % — ABNORMAL LOW (ref 39.0–52.0)
Hemoglobin: 9.7 g/dL — ABNORMAL LOW (ref 13.0–17.0)
MCH: 26.6 pg (ref 26.0–34.0)
MCHC: 31 g/dL (ref 30.0–36.0)
MCV: 86 fL (ref 80.0–100.0)
Platelets: 238 10*3/uL (ref 150–400)
RBC: 3.64 MIL/uL — ABNORMAL LOW (ref 4.22–5.81)
RDW: 16.6 % — ABNORMAL HIGH (ref 11.5–15.5)
WBC: 7 10*3/uL (ref 4.0–10.5)
nRBC: 0 % (ref 0.0–0.2)

## 2022-12-11 SURGERY — COLONOSCOPY WITH PROPOFOL
Anesthesia: Monitor Anesthesia Care

## 2022-12-11 MED ORDER — SPOT INK MARKER SYRINGE KIT
PACK | SUBMUCOSAL | Status: AC
  Filled 2022-12-11: qty 5

## 2022-12-11 MED ORDER — ENSURE ENLIVE PO LIQD
237.0000 mL | Freq: Three times a day (TID) | ORAL | Status: DC
  Administered 2022-12-11 – 2022-12-13 (×8): 237 mL via ORAL

## 2022-12-11 MED ORDER — PROPOFOL 500 MG/50ML IV EMUL
INTRAVENOUS | Status: DC | PRN
  Administered 2022-12-11: 150 ug/kg/min via INTRAVENOUS

## 2022-12-11 MED ORDER — LIDOCAINE 2% (20 MG/ML) 5 ML SYRINGE
INTRAMUSCULAR | Status: DC | PRN
  Administered 2022-12-11: 100 mg via INTRAVENOUS

## 2022-12-11 MED ORDER — LACTATED RINGERS IV SOLN
INTRAVENOUS | Status: AC | PRN
  Administered 2022-12-11: 1000 mL via INTRAVENOUS

## 2022-12-11 MED ORDER — PROPOFOL 10 MG/ML IV BOLUS
INTRAVENOUS | Status: DC | PRN
  Administered 2022-12-11: 70 mg via INTRAVENOUS

## 2022-12-11 MED ORDER — HYDROMORPHONE HCL 1 MG/ML IJ SOLN
0.5000 mg | INTRAMUSCULAR | Status: AC | PRN
  Administered 2022-12-11 – 2022-12-12 (×3): 0.5 mg via INTRAVENOUS
  Filled 2022-12-11 (×3): qty 0.5

## 2022-12-11 MED ORDER — METOPROLOL SUCCINATE ER 25 MG PO TB24
12.5000 mg | ORAL_TABLET | Freq: Every day | ORAL | Status: DC
  Administered 2022-12-11: 12.5 mg via ORAL
  Filled 2022-12-11 (×2): qty 1

## 2022-12-11 MED ORDER — EPHEDRINE SULFATE (PRESSORS) 50 MG/ML IJ SOLN
INTRAMUSCULAR | Status: DC | PRN
  Administered 2022-12-11: 10 mg via INTRAVENOUS

## 2022-12-11 MED ORDER — HYDROMORPHONE HCL 1 MG/ML IJ SOLN
0.5000 mg | Freq: Once | INTRAMUSCULAR | Status: AC
  Administered 2022-12-11: 0.5 mg via INTRAVENOUS
  Filled 2022-12-11: qty 0.5

## 2022-12-11 MED ORDER — ENSURE ENLIVE PO LIQD: 237.0000 mL | Freq: Two times a day (BID) | ORAL | Status: DC

## 2022-12-11 SURGICAL SUPPLY — 22 items

## 2022-12-11 NOTE — Anesthesia Postprocedure Evaluation (Signed)
Anesthesia Post Note  Patient: Ian Estes  Procedure(s) Performed: COLONOSCOPY WITH PROPOFOL BIOPSY SUBMUCOSAL TATTOO INJECTION POLYPECTOMY     Patient location during evaluation: PACU Anesthesia Type: MAC Level of consciousness: awake Pain management: pain level controlled Vital Signs Assessment: post-procedure vital signs reviewed and stable Respiratory status: spontaneous breathing, nonlabored ventilation and respiratory function stable Cardiovascular status: stable and blood pressure returned to baseline Postop Assessment: no apparent nausea or vomiting Anesthetic complications: no   No notable events documented.  Last Vitals:  Vitals:   12/11/22 1115 12/11/22 1129  BP:  131/70  Pulse: (!) 57 (!) 53  Resp: (!) 21 20  Temp:  (!) 36.4 C  SpO2: 100% 100%    Last Pain:  Vitals:   12/11/22 1129  TempSrc: Oral  PainSc:                  Nilda Simmer

## 2022-12-11 NOTE — Op Note (Signed)
[facility] Patient Name: Ian Estes Procedure Date: 12/11/2022 MRN: [id number] Attending MD: Carol Ada , MD, [id number] Date of Birth: 1941/07/07 CSN: [id number] Age: 82 Admit Type: Inpatient Procedure:                Colonoscopy Indications:              Hematochezia, Abnormal CT of the GI tract Providers:                Carol Ada, MD, Mikey College, RN, Brien Mates, Technician Referring MD:              Medicines:                Propofol per Anesthesia Complications:            No immediate complications. Estimated Blood Loss:     Estimated blood loss: none. Procedure:                Pre-Anesthesia Assessment:                           - Prior to the procedure, a History and Physical                            was performed, and patient medications and                            allergies were reviewed. The patient's tolerance of                            previous anesthesia was also reviewed. The risks                            and benefits of the procedure and the sedation                            options and risks were discussed with the patient.                            All questions were answered, and informed consent                            was obtained. Prior Anticoagulants: The patient has                            taken no anticoagulant or antiplatelet agents. ASA                            Grade Assessment: III - A patient with severe                            systemic disease. After reviewing the risks and  benefits, the patient was deemed in satisfactory                            condition to undergo the procedure.                           - Sedation was administered by an anesthesia                            professional. Deep sedation was attained.                           After obtaining informed consent, the colonoscope                            was passed under  direct vision. Throughout the                            procedure, the patient's blood pressure, pulse, and                            oxygen saturations were monitored continuously. The                            [id number]) Olympus colonoscope was                            introduced through the anus and advanced to the the                            cecum, identified by appendiceal orifice and                            ileocecal valve. The colonoscopy was performed                            without difficulty. The patient tolerated the                            procedure well. The quality of the bowel                            preparation was evaluated using the BBPS [facility]                            Bowel Preparation Scale) with scores of: Right                            Colon = 2 (minor amount of residual staining, small                            fragments of stool and/or opaque liquid, but mucosa  seen well), Transverse Colon = 2 (minor amount of                            residual staining, small fragments of stool and/or                            opaque liquid, but mucosa seen well) and Left Colon                            = 2 (minor amount of residual staining, small                            fragments of stool and/or opaque liquid, but mucosa                            seen well). The total BBPS score equals 6. The                            quality of the bowel preparation was good. The                            ileocecal valve, appendiceal orifice, and rectum                            were photographed. Scope In: 10:17:46 AM Scope Out: 10:42:15 AM Scope Withdrawal Time: 0 hours 17 minutes 47 seconds  Total Procedure Duration: 0 hours 24 minutes 29 seconds  Findings:      A fungating, infiltrative and ulcerated non-obstructing large mass was       found in the ascending colon. The mass was partially circumferential        (involving two-thirds of the lumen circumference). The mass measured       five cm in length. No bleeding was present. This was biopsied with a       cold forceps for histology. Area was tattooed with an injection of 5 mL       of Spot (carbon black).      A 10 mm polyp was found in the descending colon. The polyp was sessile.       The polyp was removed with a cold snare. Resection and retrieval were       complete.      Scattered large-mouthed and small-mouthed diverticula were found in the       sigmoid colon and descending colon.      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 It was 75%       circumfirential and nonobstruction. The length of the entire lesion was       difficult to ascertain as a stable position of the colonoscope was not       possible, but it was estimated to be 5 cm. It appeared to encompass the       entire ascending colon. 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 Biopsied.  Tattooed.                           - One 10 mm polyp in the descending colon, removed                            with a cold snare. Resected and retrieved.                           - Diverticulosis in the sigmoid colon and in the                            descending colon. Moderate Sedation:      Not Applicable - Patient had care per Anesthesia. Recommendation:           - Return patient to hospital ward for ongoing care.                           - Clear liquid diet.                           - Continue present medications.                           - Await pathology results.                           - Recheck CEA.                           - Repeat colonoscopy in 1 year. Procedure Code(s):        --- Professional ---                           [phone], Colonoscopy, flexible; with removal of                            tumor(s), polyp(s), or other lesion(s) by snare                            technique                            45380, 59, Colonoscopy, flexible; with biopsy,                            single or multiple                           45381, Colonoscopy, flexible; with directed                            submucosal injection(s), any substance Diagnosis Code(s):        --- Professional ---                           C18.2, Malignant neoplasm of ascending colon  D12.4, Benign neoplasm of descending colon                           K92.1, Melena (includes Hematochezia)                           K57.30, Diverticulosis of large intestine without                            perforation or abscess without bleeding                           R93.3, Abnormal findings on diagnostic imaging of                            other parts of digestive tract CPT copyright 2022 [employer]. All rights reserved. The codes documented in this report are preliminary and upon coder review may  be revised to meet current compliance requirements. Carol Ada, MD Carol Ada, MD 12/11/2022 10:51:22 AM This report has been signed electronically. Number of Addenda: 0

## 2022-12-11 NOTE — Interval H&P Note (Signed)
History and Physical Interval Note:  12/11/2022 9:51 AM  Ian Estes  has presented today for surgery, with the diagnosis of GI bleed and abnormal CT scan.  The various methods of treatment have been discussed with the patient and family. After consideration of risks, benefits and other options for treatment, the patient has consented to  Procedure(s): COLONOSCOPY WITH PROPOFOL (N/A) as a surgical intervention.  The patient's history has been reviewed, patient examined, no change in status, stable for surgery.  I have reviewed the patient's chart and labs.  Questions were answered to the patient's satisfaction.     Kyllie Pettijohn D

## 2022-12-11 NOTE — Progress Notes (Addendum)
PROGRESS NOTE    Ian Estes  [id number] DOB: August 01, 1941 DOA: 12/09/2022 PCP: Aletha Halim., PA-C     Brief Narrative:  Ian Estes is a 82 y.o. male with medical history significant of CKD stage III, A-fib on Eliquis, hypertension, hyperlipidemia who presents with abdominal pain and nausea.   He was admitted from 1/7 to 1/12 secondary to acute sigmoid diverticulitis with contained perforation/abscess.   He was also admitted again 1/21 to 1/25 secondary to recurrent diverticulitis.   Admitted again 2/14 to 2/16 secondary to small bowel obstruction.  At that time, his small bowel obstruction resolved with conservative measures.  He was recommended for outpatient colonoscopy.   He now returns with recurrent abdominal pain.  He states that he has been tolerating diet at home, abdominal pain started yesterday.  Significantly worsened today.  Denies any vomiting.  No other issues such as fevers, shortness of breath, chest pain or cough.  His last bowel movement was today, had a small amount.  He was admitted 2/21 for recurrent SBO.  General surgery was consulted.  SBO resolved and NGT removed. Due to hematochezia, GI consulted.   New events last 24 hours / Subjective: Feeling well, seen prior to colonoscopy. No abd pain.   Assessment & Plan:  Principal Problem:   SBO (small bowel obstruction) (HCC) Active Problems:   Chronic atrial fibrillation (HCC)   Chronic kidney disease, stage 3a (HCC)   Hypertension   Colonic mass   Hyperkalemia   Recurrent small bowel obstruction, recent history of diverticulitis -General surgery following -SBO improved   Ascending colon mass and hematochezia  -Colonoscopy today    Hyperkalemia -Resolved   Chronic A-fib -Resume metoprolol, hold Eliquis (last dose 2/21 AM)     DVT prophylaxis:  SCDs Start: 12/09/22 1744  Code Status: DNR Family Communication: No family at bedside Disposition Plan:  Status is: Inpatient Remains  inpatient appropriate because: Colonoscopy today   Consultants:  General surgery GI  Antimicrobials:  Anti-infectives (From admission, onward)    None        Objective: Vitals:   12/11/22 1105 12/11/22 1110 12/11/22 1115 12/11/22 1129  BP:  116/71  131/70  Pulse: 62 61 (!) 57 (!) 53  Resp: 14 12 (!) 21 20  Temp:    (!) 97.5 F (36.4 C)  TempSrc:    Oral  SpO2: 100% 100% 100% 100%  Weight:      Height:        Intake/Output Summary (Last 24 hours) at 12/11/2022 1143 Last data filed at 12/11/2022 1049 Gross per 24 hour  Intake 1454.71 ml  Output 1500 ml  Net -45.29 ml    Filed Weights   12/09/22 1415  Weight: 89.4 kg    Examination:  General exam: Appears calm and comfortable  Respiratory system: Clear to auscultation. Respiratory effort normal. No respiratory distress. No conversational dyspnea.  Cardiovascular system: S1 & S2 heard, Bradycardic rate. No murmurs. No pedal edema. Gastrointestinal system: Abdomen is nondistended, soft and nontender. Normal bowel sounds heard. Central nervous system: Alert and oriented. No focal neurological deficits. Speech clear.  Extremities: Symmetric in appearance  Skin: No rashes, lesions or ulcers on exposed skin  Psychiatry: Judgement and insight appear normal. Mood & affect appropriate.   Data Reviewed: I have personally reviewed following labs and imaging studies  CBC: Recent Labs  Lab 12/09/22 1307 12/09/22 1326 12/10/22 0523 12/11/22 0450  WBC 8.8  --  6.8 7.0  NEUTROABS 6.0  --   --   --  HGB 11.3* 12.9* 9.9* 9.7*  HCT 36.7* 38.0* 31.7* 31.3*  MCV 85.3  --  86.1 86.0  PLT 306  --  221 [id number]    Basic Metabolic Panel: Recent Labs  Lab 12/09/22 1307 12/09/22 1326 12/09/22 1812 12/10/22 0034 12/10/22 0523 12/11/22 0450  NA 136 134* 134*  --  135 134*  K 4.4 5.8* 4.5 4.5 5.1 4.2  CL 102 100 102  --  103 103  CO2 25  --  22  --  22 23  GLUCOSE 139* 136* 99  --  91 100*  BUN '13 14 12  '$ --  11 9   CREATININE 1.11 1.00 1.08  --  1.23 1.10  CALCIUM 8.9  --  8.8*  --  8.6* 8.3*    GFR: Estimated Creatinine Clearance: 56.8 mL/min (by C-G formula based on SCr of 1.1 mg/dL). Liver Function Tests: Recent Labs  Lab 12/09/22 1307 12/10/22 0523  AST 27 29  ALT 16 13  ALKPHOS 145* 124  BILITOT 0.5 0.8  PROT 6.7 5.8*  ALBUMIN 3.1* 2.6*    Recent Labs  Lab 12/09/22 1307  LIPASE 36    No results for input(s): "AMMONIA" in the last 168 hours. Coagulation Profile: No results for input(s): "INR", "PROTIME" in the last 168 hours. Cardiac Enzymes: No results for input(s): "CKTOTAL", "CKMB", "CKMBINDEX", "TROPONINI" in the last 168 hours. BNP (last 3 results) No results for input(s): "PROBNP" in the last 8760 hours. HbA1C: No results for input(s): "HGBA1C" in the last 72 hours. CBG: No results for input(s): "GLUCAP" in the last 168 hours.  Lipid Profile: No results for input(s): "CHOL", "HDL", "LDLCALC", "TRIG", "CHOLHDL", "LDLDIRECT" in the last 72 hours. Thyroid Function Tests: No results for input(s): "TSH", "T4TOTAL", "FREET4", "T3FREE", "THYROIDAB" in the last 72 hours. Anemia Panel: No results for input(s): "VITAMINB12", "FOLATE", "FERRITIN", "TIBC", "IRON", "RETICCTPCT" in the last 72 hours. Sepsis Labs: No results for input(s): "PROCALCITON", "LATICACIDVEN" in the last 168 hours.  No results found for this or any previous visit (from the past 240 hour(s)).    Radiology Studies: DG Abd Portable 1V-Small Bowel Obstruction Protocol-initial, 8 hr delay  Result Date: 12/10/2022 CLINICAL DATA:  82 year old male with abdominal pain, CT appearance of small-bowel obstruction yesterday. 8 hours status post oral contrast administration yesterday. EXAM: PORTABLE ABDOMEN - 1 VIEW COMPARISON:  Abdominal radiographs yesterday 2052 hours and earlier. FINDINGS: Portable AP supine view at 0734 hours. Oral contrast appears to be primarily within redundant colon, including the rectum.  Superimposed excreted IV contrast in the urinary bladder is also possible. Bowel-gas pattern compared to the CT scout view yesterday is improved. Mild residual gas distended small bowel loops. Stable visualized osseous structures. Pelvic sidewall surgical clips. IMPRESSION: Evidence of improved or resolved small bowel obstruction with administered oral contrast primarily within the colon. Mild residual gas distended small bowel loops. Electronically Signed   By: Genevie Ann M.D.   On: 12/10/2022 07:55   DG Abd 1 View  Result Date: 12/09/2022 CLINICAL DATA:  Check gastric catheter placement EXAM: ABDOMEN - 1 VIEW COMPARISON:  Film from earlier in the same day. FINDINGS: Gastric catheter has been advanced slightly deeper into the stomach. Proximal side port now lies in the gastric lumen. IMPRESSION: Gastric catheter has been advanced and now lies in satisfactory position. Electronically Signed   By: Inez Catalina M.D.   On: 12/09/2022 23:02   DG Abd Portable 1V-Small Bowel Protocol-Position Verification  Result Date: 12/09/2022 CLINICAL DATA:  Check  gastric catheter placement EXAM: PORTABLE ABDOMEN - 1 VIEW COMPARISON:  CT from earlier in the same day. FINDINGS: Gastric catheter is noted with the tip in the stomach. Proximal side port however is noted in the distal esophagus. This could be advanced several cm deeper into the stomach. Persistent small bowel dilatation is noted in the right hemiabdomen. IMPRESSION: Gastric catheter as described. This should be advanced deeper into the stomach. Electronically Signed   By: Inez Catalina M.D.   On: 12/09/2022 19:03   CT ABDOMEN PELVIS W CONTRAST  Result Date: 12/09/2022 CLINICAL DATA:  Abdominal pain, acute, nonlocalized. History of small-bowel obstruction. EXAM: CT ABDOMEN AND PELVIS WITH CONTRAST TECHNIQUE: Multidetector CT imaging of the abdomen and pelvis was performed using the standard protocol following bolus administration of intravenous contrast. RADIATION  DOSE REDUCTION: This exam was performed according to the departmental dose-optimization program which includes automated exposure control, adjustment of the mA and/or kV according to patient size and/or use of iterative reconstruction technique. CONTRAST:  127m OMNIPAQUE IOHEXOL 300 MG/ML  SOLN COMPARISON:  CT abdomen and pelvis 12/02/2022 FINDINGS: Lower chest: Chronic fibrosis or scarring in the lung bases. No pleural effusion. Cardiomegaly and coronary atherosclerosis. Hepatobiliary: Diffusely decreased attenuation of the liver suggestive of steatosis. Mildly lobular liver contour without definite findings of cirrhosis. Unremarkable gallbladder. No biliary dilatation. Pancreas: Fatty atrophy, greatest in the pancreatic head and neck region. No ductal dilatation or acute inflammation. Spleen: Unremarkable. Adrenals/Urinary Tract: Mild chronic thickening of the adrenal glands. Bilateral renal atrophy. 18 mm cyst in the interpolar left kidney for which no follow-up imaging is recommended. No renal calculi or hydronephrosis. Unremarkable bladder. Stomach/Bowel: The stomach is unremarkable. There are multiple dilated loops of small bowel with air-fluid levels measuring up to 4.6 cm in diameter in the central abdomen and right upper quadrant. A small bowel loop anteriorly in the right mid abdomen demonstrates wall thickening and hyperenhancement, and adjacent inflammatory changes track into the central mesentery. The distal small bowel is decompressed, with suspected transition in the central abdomen. Numerous small bowel diverticula are again noted. There is also widespread colonic diverticulosis. An annular, a suspected mass in the proximal ascending colon is unchanged. The appendix is unremarkable. Vascular/Lymphatic: Abdominal aortic atherosclerosis without aneurysm. No enlarged lymph nodes. Reproductive: Small or absent prostate. Other: Trace pelvic free fluid. No organized fluid collection. No pneumoperitoneum.  Musculoskeletal: No acute osseous abnormality or suspicious osseous lesion. IMPRESSION: 1. Recurrent small bowel obstruction with suspected transition in the central abdomen. Associated inflammation of a small bowel loop in the right mid abdomen. 2. Unchanged suspected mass in the ascending colon. 3.  Aortic Atherosclerosis (ICD10-I70.0). Electronically Signed   By: ALogan BoresM.D.   On: 12/09/2022 15:36      Scheduled Meds:  furosemide  40 mg Intravenous Once   metoprolol succinate  12.5 mg Oral Daily   ondansetron (ZOFRAN) IV  4 mg Intravenous Once   sodium chloride flush  3 mL Intravenous Q12H   Continuous Infusions:  sodium chloride 75 mL/hr at 12/10/22 1301     LOS: 2 days   Time spent: 25 minutes   JDessa Phi DO [facility] 12/11/2022, 11:43 AM   Available via Epic secure chat 7am-7pm After these hours, please refer to coverage provider listed on [email or web address]

## 2022-12-11 NOTE — Transfer of Care (Signed)
Immediate Anesthesia Transfer of Care Note  Patient: Ian Estes  Procedure(s) Performed: COLONOSCOPY WITH PROPOFOL BIOPSY SUBMUCOSAL TATTOO INJECTION POLYPECTOMY  Patient Location: PACU and Endoscopy Unit  Anesthesia Type:MAC  Level of Consciousness: awake, drowsy, and responds to stimulation  Airway & Oxygen Therapy: Patient Spontanous Breathing and Patient connected to face mask oxygen  Post-op Assessment: Report given to RN and Post -op Vital signs reviewed and stable  Post vital signs: Reviewed and stable  Last Vitals:  Vitals Value Taken Time  BP 105/56 12/11/22 1048  Temp    Pulse 71 12/11/22 1049  Resp 10 12/11/22 1049  SpO2 100 % 12/11/22 1049  Vitals shown include unvalidated device data.  Last Pain:  Vitals:   12/11/22 0949  TempSrc: Temporal  PainSc: 0-No pain      Patients Stated Pain Goal: 0 ([id number])  Complications: No notable events documented.

## 2022-12-11 NOTE — Progress Notes (Signed)
Subjective: CC: GI planning colonoscopy today.  Reports he tolerated 2/3 prep but then started getting some crampy abdominal pain. This has resolved today. No current abdominal pain. No n/v. Reports 10+ liquid bloody bm's.  No tachycardia or hypotension. Hgb stable at 9.7.   Objective: Vital signs in last 24 hours: Temp:  [97.5 F (36.4 C)-98.3 F (36.8 C)] 98.3 F (36.8 C) (02/23 0404) Pulse Rate:  [58] 58 (02/23 0404) Resp:  [17-18] 17 (02/23 0404) BP: (119-128)/(66-71) 119/70 (02/23 0404) SpO2:  [97 %-100 %] 97 % (02/23 0404) Last BM Date : 12/10/22  Intake/Output from previous day: 02/22 0701 - 02/23 0700 In: 1318.8 [P.O.:920; I.V.:398.8] Out: 1650 [Urine:850; Emesis/NG output:600; Stool:200] Intake/Output this shift: No intake/output data recorded.  PE: Gen:  Alert, NAD, pleasant Abd: Soft, ND, NT, +BS. Lower midline abdominal scar well healed.   Lab Results:  Recent Labs    12/10/22 0523 12/11/22 0450  WBC 6.8 7.0  HGB 9.9* 9.7*  HCT 31.7* 31.3*  PLT 221 238    BMET Recent Labs    12/10/22 0523 12/11/22 0450  NA 135 134*  K 5.1 4.2  CL 103 103  CO2 22 23  GLUCOSE 91 100*  BUN 11 9  CREATININE 1.23 1.10  CALCIUM 8.6* 8.3*    PT/INR No results for input(s): "LABPROT", "INR" in the last 72 hours. CMP     Component Value Date/Time   NA 134 (L) 12/11/2022 0450   NA 138 01/12/2019 0852   K 4.2 12/11/2022 0450   CL 103 12/11/2022 0450   CO2 23 12/11/2022 0450   GLUCOSE 100 (H) 12/11/2022 0450   BUN 9 12/11/2022 0450   BUN 20 01/12/2019 0852   CREATININE 1.10 12/11/2022 0450   CALCIUM 8.3 (L) 12/11/2022 0450   PROT 5.8 (L) 12/10/2022 0523   ALBUMIN 2.6 (L) 12/10/2022 0523   AST 29 12/10/2022 0523   ALT 13 12/10/2022 0523   ALKPHOS 124 12/10/2022 0523   BILITOT 0.8 12/10/2022 0523   GFRNONAA >60 12/11/2022 0450   GFRAA 63 01/12/2019 0852   Lipase     Component Value Date/Time   LIPASE 36 12/09/2022 1307     Studies/Results: DG Abd Portable 1V-Small Bowel Obstruction Protocol-initial, 8 hr delay  Result Date: 12/10/2022 CLINICAL DATA:  82 year old male with abdominal pain, CT appearance of small-bowel obstruction yesterday. 8 hours status post oral contrast administration yesterday. EXAM: PORTABLE ABDOMEN - 1 VIEW COMPARISON:  Abdominal radiographs yesterday 2052 hours and earlier. FINDINGS: Portable AP supine view at 0734 hours. Oral contrast appears to be primarily within redundant colon, including the rectum. Superimposed excreted IV contrast in the urinary bladder is also possible. Bowel-gas pattern compared to the CT scout view yesterday is improved. Mild residual gas distended small bowel loops. Stable visualized osseous structures. Pelvic sidewall surgical clips. IMPRESSION: Evidence of improved or resolved small bowel obstruction with administered oral contrast primarily within the colon. Mild residual gas distended small bowel loops. Electronically Signed   By: Genevie Ann M.D.   On: 12/10/2022 07:55   DG Abd 1 View  Result Date: 12/09/2022 CLINICAL DATA:  Check gastric catheter placement EXAM: ABDOMEN - 1 VIEW COMPARISON:  Film from earlier in the same day. FINDINGS: Gastric catheter has been advanced slightly deeper into the stomach. Proximal side port now lies in the gastric lumen. IMPRESSION: Gastric catheter has been advanced and now lies in satisfactory position. Electronically Signed   By: Linus Mako.D.  On: 12/09/2022 23:02   DG Abd Portable 1V-Small Bowel Protocol-Position Verification  Result Date: 12/09/2022 CLINICAL DATA:  Check gastric catheter placement EXAM: PORTABLE ABDOMEN - 1 VIEW COMPARISON:  CT from earlier in the same day. FINDINGS: Gastric catheter is noted with the tip in the stomach. Proximal side port however is noted in the distal esophagus. This could be advanced several cm deeper into the stomach. Persistent small bowel dilatation is noted in the right hemiabdomen.  IMPRESSION: Gastric catheter as described. This should be advanced deeper into the stomach. Electronically Signed   By: Inez Catalina M.D.   On: 12/09/2022 19:03   CT ABDOMEN PELVIS W CONTRAST  Result Date: 12/09/2022 CLINICAL DATA:  Abdominal pain, acute, nonlocalized. History of small-bowel obstruction. EXAM: CT ABDOMEN AND PELVIS WITH CONTRAST TECHNIQUE: Multidetector CT imaging of the abdomen and pelvis was performed using the standard protocol following bolus administration of intravenous contrast. RADIATION DOSE REDUCTION: This exam was performed according to the departmental dose-optimization program which includes automated exposure control, adjustment of the mA and/or kV according to patient size and/or use of iterative reconstruction technique. CONTRAST:  133m OMNIPAQUE IOHEXOL 300 MG/ML  SOLN COMPARISON:  CT abdomen and pelvis 12/02/2022 FINDINGS: Lower chest: Chronic fibrosis or scarring in the lung bases. No pleural effusion. Cardiomegaly and coronary atherosclerosis. Hepatobiliary: Diffusely decreased attenuation of the liver suggestive of steatosis. Mildly lobular liver contour without definite findings of cirrhosis. Unremarkable gallbladder. No biliary dilatation. Pancreas: Fatty atrophy, greatest in the pancreatic head and neck region. No ductal dilatation or acute inflammation. Spleen: Unremarkable. Adrenals/Urinary Tract: Mild chronic thickening of the adrenal glands. Bilateral renal atrophy. 18 mm cyst in the interpolar left kidney for which no follow-up imaging is recommended. No renal calculi or hydronephrosis. Unremarkable bladder. Stomach/Bowel: The stomach is unremarkable. There are multiple dilated loops of small bowel with air-fluid levels measuring up to 4.6 cm in diameter in the central abdomen and right upper quadrant. A small bowel loop anteriorly in the right mid abdomen demonstrates wall thickening and hyperenhancement, and adjacent inflammatory changes track into the central  mesentery. The distal small bowel is decompressed, with suspected transition in the central abdomen. Numerous small bowel diverticula are again noted. There is also widespread colonic diverticulosis. An annular, a suspected mass in the proximal ascending colon is unchanged. The appendix is unremarkable. Vascular/Lymphatic: Abdominal aortic atherosclerosis without aneurysm. No enlarged lymph nodes. Reproductive: Small or absent prostate. Other: Trace pelvic free fluid. No organized fluid collection. No pneumoperitoneum. Musculoskeletal: No acute osseous abnormality or suspicious osseous lesion. IMPRESSION: 1. Recurrent small bowel obstruction with suspected transition in the central abdomen. Associated inflammation of a small bowel loop in the right mid abdomen. 2. Unchanged suspected mass in the ascending colon. 3.  Aortic Atherosclerosis (ICD10-I70.0). Electronically Signed   By: ALogan BoresM.D.   On: 12/09/2022 15:36    Anti-infectives: Anti-infectives (From admission, onward)    None        Assessment/Plan SBO, recurrent Patient with recent admission for diverticulitis in Jan 2024. During that admission he underwent IR drain placement. There was concern that the drain migrated through SB lumen and the drain was removed 1/22. He was just admitted last week for SBO that was felt likely 2/2 to above (also hx of prior prostatectomy so possible of intra-abdominal adhesions). He improved and was able to be discahrged on 2/16. Returned 2/21 with likely partial sbo.  - SBO appears resolved. Pt w/ resolution of symptoms, return of bowel function and contrast in  colon on xray.  - Tolerated cld yesterday. NPO for colonoscopy. Can have FLD post op with ADAT order to soft.  - We will continue to follow   Ascending colon mass on CT ABL anemia  BRB per rectum/GI bleed - No tachycardia or hypotension. Hgb stable at 9.7 today.  - GI planning colonoscopy today to evaluate.    FEN - NPO for colonoscopy.  Can have FLD post op with ADAT order to soft. IVF per [facility] VTE - SCDs, hold chem ppx for abl anemia  ID - None  I reviewed nursing notes, hospitalist notes, last 24 h vitals and pain scores, last 48 h intake and output, last 24 h labs and trends, and last 24 h imaging results.   LOS: 2 days    Jillyn Ledger , [facility] 12/11/2022, 9:24 AM Please see Amion for pager number during day hours 7:00am-4:30pm

## 2022-12-12 DIAGNOSIS — K56609 Unspecified intestinal obstruction, unspecified as to partial versus complete obstruction: Secondary | ICD-10-CM | POA: Diagnosis not present

## 2022-12-12 LAB — CBC
HCT: 32.3 % — ABNORMAL LOW (ref 39.0–52.0)
Hemoglobin: 9.8 g/dL — ABNORMAL LOW (ref 13.0–17.0)
MCH: 26.3 pg (ref 26.0–34.0)
MCHC: 30.3 g/dL (ref 30.0–36.0)
MCV: 86.6 fL (ref 80.0–100.0)
Platelets: 203 10*3/uL (ref 150–400)
RBC: 3.73 MIL/uL — ABNORMAL LOW (ref 4.22–5.81)
RDW: 16.3 % — ABNORMAL HIGH (ref 11.5–15.5)
WBC: 8.4 10*3/uL (ref 4.0–10.5)
nRBC: 0 % (ref 0.0–0.2)

## 2022-12-12 LAB — BASIC METABOLIC PANEL
Anion gap: 8 (ref 5–15)
BUN: 8 mg/dL (ref 8–23)
CO2: 22 mmol/L (ref 22–32)
Calcium: 8.3 mg/dL — ABNORMAL LOW (ref 8.9–10.3)
Chloride: 104 mmol/L (ref 98–111)
Creatinine, Ser: 1.09 mg/dL (ref 0.61–1.24)
GFR, Estimated: >60 mL/min (ref >60)
Glucose, Bld: 125 mg/dL — ABNORMAL HIGH (ref 70–99)
Potassium: 4.5 mmol/L (ref 3.5–5.1)
Sodium: 134 mmol/L — ABNORMAL LOW (ref 135–145)

## 2022-12-12 MED ORDER — OXYCODONE-ACETAMINOPHEN 5-325 MG PO TABS
1.0000 | ORAL_TABLET | ORAL | Status: DC | PRN
  Administered 2022-12-12: 2 via ORAL
  Administered 2022-12-12: 1 via ORAL
  Administered 2022-12-12: 2 via ORAL
  Administered 2022-12-13: 1 via ORAL
  Administered 2022-12-13: 2 via ORAL
  Filled 2022-12-12 (×2): qty 2
  Filled 2022-12-12: qty 1
  Filled 2022-12-12: qty 2
  Filled 2022-12-12: qty 1

## 2022-12-12 MED ORDER — HYDROMORPHONE HCL 1 MG/ML IJ SOLN
0.5000 mg | INTRAMUSCULAR | Status: DC | PRN
  Administered 2022-12-12 – 2022-12-15 (×8): 0.5 mg via INTRAVENOUS
  Filled 2022-12-12 (×8): qty 0.5

## 2022-12-12 MED ORDER — SODIUM CHLORIDE 0.9 % IV SOLN: INTRAVENOUS | Status: DC

## 2022-12-12 NOTE — Progress Notes (Addendum)
PROGRESS NOTE    Ian Estes  [id number] DOB: 1941-08-29 DOA: 12/09/2022 PCP: Aletha Halim., PA-C     Brief Narrative:  Ian Estes is a 82 y.o. male with medical history significant of CKD stage III, A-fib on Eliquis, hypertension, hyperlipidemia who presents with abdominal pain and nausea.   He was admitted from 1/7 to 1/12 secondary to acute sigmoid diverticulitis with contained perforation/abscess.   He was also admitted again 1/21 to 1/25 secondary to recurrent diverticulitis.   Admitted again 2/14 to 2/16 secondary to small bowel obstruction.  At that time, his small bowel obstruction resolved with conservative measures.  He was recommended for outpatient colonoscopy.   He now returns with recurrent abdominal pain.  He states that he has been tolerating diet at home, abdominal pain started yesterday.  Significantly worsened today.  Denies any vomiting.  No other issues such as fevers, shortness of breath, chest pain or cough.  His last bowel movement was today, had a small amount.  He was admitted 2/21 for recurrent SBO.  General surgery was consulted.  SBO resolved and NGT removed. Due to hematochezia, GI consulted.  Patient underwent colonoscopy by Dr. Benson Norway 2/23.  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  New events last 24 hours / Subjective: Having abdominal pain, especially right side and lower abdomen.  Had episode of vomiting last night.  Assessment & Plan:  Principal Problem:   SBO (small bowel obstruction) (HCC) Active Problems:   Chronic atrial fibrillation (HCC)   Chronic kidney disease, stage 3a (HCC)   Hypertension   Colonic mass   Hyperkalemia   Recurrent small bowel obstruction, recent history of diverticulitis -General surgery following -SBO improved  -Remains on full liquid diet  Colon cancer -Status post colonoscopy 2/23, Dr. Benson Norway -Pathology pending -Awaiting surgical plan, will likely need right colectomy, sigmoid  colectomy and small bowel resection. Discussed with Dr. Marlou Starks today    Hyperkalemia -Resolved   Chronic A-fib -Resume metoprolol, hold Eliquis (last dose 2/21 AM)     DVT prophylaxis:  SCDs Start: 12/09/22 1744  Code Status: DNR Family Communication: No family at bedside Disposition Plan:  Status is: Inpatient Remains inpatient appropriate because: Pending further surgical plan  Consultants:  General surgery GI  Antimicrobials:  Anti-infectives (From admission, onward)    None        Objective: Vitals:   12/11/22 2241 12/12/22 0614 12/12/22 1024 12/12/22 1156  BP: 109/62 115/76  122/71  Pulse: 62 61 (!) 48 (!) 47  Resp:  19    Temp: 98.7 F (37.1 C) 97.8 F (36.6 C)    TempSrc: Oral Oral    SpO2: 98% 98%    Weight:      Height:        Intake/Output Summary (Last 24 hours) at 12/12/2022 1200 Last data filed at 12/12/2022 0306 Gross per 24 hour  Intake 3108.9 ml  Output --  Net 3108.9 ml    Filed Weights   12/09/22 1415  Weight: 89.4 kg    Examination:  General exam: Appears calm and comfortable  Respiratory system: Clear to auscultation. Respiratory effort normal. No respiratory distress. No conversational dyspnea.  Cardiovascular system: S1 & S2 heard, Bradycardic rate. No murmurs. No pedal edema. Gastrointestinal system: Abdomen is nondistended, soft and tender to palpation right hemiabdomen, bowel sounds present Central nervous system: Alert and oriented. No focal neurological deficits. Speech clear.  Extremities: Symmetric in appearance  Skin: No rashes, lesions or ulcers on exposed skin  Psychiatry: Judgement and insight appear normal. Mood & affect appropriate.   Data Reviewed: I have personally reviewed following labs and imaging studies  CBC: Recent Labs  Lab 12/09/22 1307 12/09/22 1326 12/10/22 0523 12/11/22 0450 12/12/22 0923  WBC 8.8  --  6.8 7.0 8.4  NEUTROABS 6.0  --   --   --   --   HGB 11.3* 12.9* 9.9* 9.7* 9.8*  HCT 36.7*  38.0* 31.7* 31.3* 32.3*  MCV 85.3  --  86.1 86.0 86.6  PLT 306  --  221 238 [id number]    Basic Metabolic Panel: Recent Labs  Lab 12/09/22 1307 12/09/22 1326 12/09/22 1812 12/10/22 0034 12/10/22 0523 12/11/22 0450 12/12/22 0546  NA 136 134* 134*  --  135 134* 134*  K 4.4 5.8* 4.5 4.5 5.1 4.2 4.5  CL 102 100 102  --  103 103 104  CO2 25  --  22  --  '22 23 22  '$ GLUCOSE 139* 136* 99  --  91 100* 125*  BUN '13 14 12  '$ --  '11 9 8  '$ CREATININE 1.11 1.00 1.08  --  1.23 1.10 1.09  CALCIUM 8.9  --  8.8*  --  8.6* 8.3* 8.3*    GFR: Estimated Creatinine Clearance: 57.3 mL/min (by C-G formula based on SCr of 1.09 mg/dL). Liver Function Tests: Recent Labs  Lab 12/09/22 1307 12/10/22 0523  AST 27 29  ALT 16 13  ALKPHOS 145* 124  BILITOT 0.5 0.8  PROT 6.7 5.8*  ALBUMIN 3.1* 2.6*    Recent Labs  Lab 12/09/22 1307  LIPASE 36    No results for input(s): "AMMONIA" in the last 168 hours. Coagulation Profile: No results for input(s): "INR", "PROTIME" in the last 168 hours. Cardiac Enzymes: No results for input(s): "CKTOTAL", "CKMB", "CKMBINDEX", "TROPONINI" in the last 168 hours. BNP (last 3 results) No results for input(s): "PROBNP" in the last 8760 hours. HbA1C: No results for input(s): "HGBA1C" in the last 72 hours. CBG: No results for input(s): "GLUCAP" in the last 168 hours.  Lipid Profile: No results for input(s): "CHOL", "HDL", "LDLCALC", "TRIG", "CHOLHDL", "LDLDIRECT" in the last 72 hours. Thyroid Function Tests: No results for input(s): "TSH", "T4TOTAL", "FREET4", "T3FREE", "THYROIDAB" in the last 72 hours. Anemia Panel: No results for input(s): "VITAMINB12", "FOLATE", "FERRITIN", "TIBC", "IRON", "RETICCTPCT" in the last 72 hours. Sepsis Labs: No results for input(s): "PROCALCITON", "LATICACIDVEN" in the last 168 hours.  No results found for this or any previous visit (from the past 240 hour(s)).    Radiology Studies: No results found.    Scheduled Meds:  feeding  supplement  237 mL Oral TID BM   furosemide  40 mg Intravenous Once   metoprolol succinate  12.5 mg Oral Daily   ondansetron (ZOFRAN) IV  4 mg Intravenous Once   sodium chloride flush  3 mL Intravenous Q12H   Continuous Infusions:  sodium chloride 75 mL/hr at 12/11/22 1943     LOS: 3 days   Time spent: 25 minutes   Dessa Phi, DO [facility] 12/12/2022, 12:00 PM   Available via Epic secure chat 7am-7pm After these hours, please refer to coverage provider listed on [email or web address]

## 2022-12-12 NOTE — Progress Notes (Signed)
1 Day Post-Op   Subjective/Chief Complaint: No complaints. Right colon mass   Objective: Vital signs in last 24 hours: Temp:  [97.5 F (36.4 C)-98.7 F (37.1 C)] 97.8 F (36.6 C) (02/24 0614) Pulse Rate:  [52-76] 61 (02/24 0614) Resp:  [12-21] 19 (02/24 0614) BP: (105-131)/(49-76) 115/76 (02/24 0614) SpO2:  [98 %-100 %] 98 % (02/24 0614) Last BM Date : 12/10/22  Intake/Output from previous day: 02/23 0701 - 02/24 0700 In: 3643.6 [P.O.:870; I.V.:2773.6] Out: -  Intake/Output this shift: No intake/output data recorded.  General appearance: alert and cooperative Resp: clear to auscultation bilaterally Cardio: regular rate and rhythm GI: soft, nontender  Lab Results:  Recent Labs    12/11/22 0450 12/12/22 0923  WBC 7.0 8.4  HGB 9.7* 9.8*  HCT 31.3* 32.3*  PLT 238 203   BMET Recent Labs    12/11/22 0450 12/12/22 0546  NA 134* 134*  K 4.2 4.5  CL 103 104  CO2 23 22  GLUCOSE 100* 125*  BUN 9 8  CREATININE 1.10 1.09  CALCIUM 8.3* 8.3*   PT/INR No results for input(s): "LABPROT", "INR" in the last 72 hours. ABG No results for input(s): "PHART", "HCO3" in the last 72 hours.  Invalid input(s): "PCO2", "PO2"  Studies/Results: No results found.  Anti-infectives: Anti-infectives (From admission, onward)    None       Assessment/Plan: s/p Procedure(s): COLONOSCOPY WITH PROPOFOL (N/A) BIOPSY SUBMUCOSAL TATTOO INJECTION POLYPECTOMY Allow clears Hg stable SBO, recurrent Patient with recent admission for diverticulitis in Jan 2024. During that admission he underwent IR drain placement. There was concern that the drain migrated through SB lumen and the drain was removed 1/22. He was just admitted last week for SBO that was felt likely 2/2 to above (also hx of prior prostatectomy so possible of intra-abdominal adhesions). He improved and was able to be discahrged on 2/16. Returned 2/21 with likely partial sbo.  - SBO appears resolved. Pt w/ resolution of  symptoms, return of bowel function and contrast in colon on xray.  - Tolerated cld yesterday. NPO for colonoscopy. Can have FLD post op with ADAT order to soft.  - We will continue to follow    Ascending colon mass on CT ABL anemia  BRB per rectum/GI bleed - No tachycardia or hypotension. Hgb stable at 9.7 today.  - GI planning colonoscopy today to evaluate.    FEN - NPO for colonoscopy. Can have FLD post op with ADAT order to soft. IVF per [facility] VTE - SCDs, hold chem ppx for abl anemia  ID - None May need right colectomy next week. Await path  LOS: 3 days    Ian Estes 12/12/2022

## 2022-12-13 DIAGNOSIS — K56609 Unspecified intestinal obstruction, unspecified as to partial versus complete obstruction: Secondary | ICD-10-CM | POA: Diagnosis not present

## 2022-12-13 LAB — TYPE AND SCREEN
ABO/RH(D): A POS
Antibody Screen: NEGATIVE

## 2022-12-13 MED ORDER — METOPROLOL SUCCINATE ER 25 MG PO TB24
12.5000 mg | ORAL_TABLET | Freq: Every day | ORAL | Status: DC
  Administered 2022-12-13: 12.5 mg via ORAL
  Filled 2022-12-13 (×2): qty 1

## 2022-12-13 MED ORDER — SODIUM CHLORIDE 0.9% IV SOLUTION: Freq: Once | INTRAVENOUS | Status: DC

## 2022-12-13 NOTE — Progress Notes (Signed)
CCMD called, the pt had 2.39 seconds SVR. RN assessed the pt, pt denied any discomfort. On-call notified.

## 2022-12-13 NOTE — Progress Notes (Signed)
2 Days Post-Op   Subjective/Chief Complaint: Complains of some soreness in abdomen   Objective: Vital signs in last 24 hours: Temp:  [98.2 F (36.8 C)-98.6 F (37 C)] 98.6 F (37 C) (02/25 0403) Pulse Rate:  [47-109] 70 (02/25 0403) Resp:  [18-20] 18 (02/25 0403) BP: (117-143)/(61-75) 123/75 (02/25 0403) SpO2:  [97 %-100 %] 97 % (02/25 0403) Last BM Date : 12/10/22  Intake/Output from previous day: 02/24 0701 - 02/25 0700 In: 720 [P.O.:720] Out: -  Intake/Output this shift: No intake/output data recorded.  General appearance: alert and cooperative Resp: clear to auscultation bilaterally Cardio: regular rate and rhythm GI: soft, minimal tenderness  Lab Results:  Recent Labs    12/11/22 0450 12/12/22 0923  WBC 7.0 8.4  HGB 9.7* 9.8*  HCT 31.3* 32.3*  PLT 238 203   BMET Recent Labs    12/11/22 0450 12/12/22 0546  NA 134* 134*  K 4.2 4.5  CL 103 104  CO2 23 22  GLUCOSE 100* 125*  BUN 9 8  CREATININE 1.10 1.09  CALCIUM 8.3* 8.3*   PT/INR No results for input(s): "LABPROT", "INR" in the last 72 hours. ABG No results for input(s): "PHART", "HCO3" in the last 72 hours.  Invalid input(s): "PCO2", "PO2"  Studies/Results: No results found.  Anti-infectives: Anti-infectives (From admission, onward)    None       Assessment/Plan: s/p Procedure(s): COLONOSCOPY WITH PROPOFOL (N/A) BIOPSY SUBMUCOSAL TATTOO INJECTION POLYPECTOMY Continue clears. Npo after midnight Near obstructing right colon mass. Path pending. Will likely need surgery this week for right colectomy. No evidence of metastatic disease Type and cross. Hg 9.7 Ambulate SBO, recurrent Patient with recent admission for diverticulitis in Jan 2024. During that admission he underwent IR drain placement. There was concern that the drain migrated through SB lumen and the drain was removed 1/22. He was just admitted last week for SBO that was felt likely 2/2 to above (also hx of prior  prostatectomy so possible of intra-abdominal adhesions). He improved and was able to be discahrged on 2/16. Returned 2/21 with likely partial sbo.  - SBO appears resolved. Pt w/ resolution of symptoms, return of bowel function and contrast in colon on xray.  - Tolerated cld yesterday. NPO for colonoscopy. Can have FLD post op with ADAT order to soft.  - We will continue to follow    Ascending colon mass on CT ABL anemia  BRB per rectum/GI bleed - No tachycardia or hypotension. Hgb stable at 9.7 today.  - GI planning colonoscopy today to evaluate.    FEN - NPO for colonoscopy. Can have FLD post op with ADAT order to soft. IVF per [facility] VTE - SCDs, hold chem ppx for abl anemia  ID - None May need right colectomy next week. Await path  LOS: 4 days    Autumn Messing III 12/13/2022

## 2022-12-13 NOTE — Progress Notes (Signed)
PROGRESS NOTE    Ian Estes  [id number] DOB: [birth year] DOA: 12/09/2022 PCP: Aletha Halim., PA-C     Brief Narrative:  Ian Estes is a 82 y.o. male with medical history significant of CKD stage III, A-fib on Eliquis, hypertension, hyperlipidemia who presents with abdominal pain and nausea.   He was admitted from 1/7 to 1/12 secondary to acute sigmoid diverticulitis with contained perforation/abscess.   He was also admitted again 1/21 to 1/25 secondary to recurrent diverticulitis.   Admitted again 2/14 to 2/16 secondary to small bowel obstruction.  At that time, his small bowel obstruction resolved with conservative measures.  He was recommended for outpatient colonoscopy.   He now returns with recurrent abdominal pain.  He states that he has been tolerating diet at home, abdominal pain started yesterday.  Significantly worsened today.  Denies any vomiting.  No other issues such as fevers, shortness of breath, chest pain or cough.  His last bowel movement was today, had a small amount.  He was admitted 2/21 for recurrent SBO.  General surgery was consulted.  SBO resolved and NGT removed. Due to hematochezia, GI consulted.  Patient underwent colonoscopy by Dr. Benson Norway 2/23.  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  New events last 24 hours / Subjective: Doing ok. Still having abdominal soreness, no vomiting. Passed gas last night.   Assessment & Plan:  Principal Problem:   SBO (small bowel obstruction) (HCC) Active Problems:   Chronic atrial fibrillation (HCC)   Chronic kidney disease, stage 3a (HCC)   Hypertension   Colonic mass   Hyperkalemia   Recurrent small bowel obstruction, recent history of diverticulitis -General surgery following -SBO improved  -Remains on full liquid diet  Colon cancer -Status post colonoscopy 2/23, Dr. Benson Norway -Pathology pending -Awaiting surgical plan, will likely need right colectomy, sigmoid colectomy and small  bowel resection next week    Hyperkalemia -Resolved   Chronic A-fib -Resume metoprolol, hold Eliquis (last dose 2/21 AM)     DVT prophylaxis:  SCDs Start: 12/09/22 1744  Code Status: DNR Family Communication: No family at bedside Disposition Plan:  Status is: Inpatient Remains inpatient appropriate because: Pending further surgical plan  Consultants:  General surgery GI  Antimicrobials:  Anti-infectives (From admission, onward)    None        Objective: Vitals:   12/12/22 1305 12/12/22 1701 12/12/22 2026 12/13/22 0403  BP: 117/65 (!) 143/66 124/61 123/75  Pulse: (!) 109 (!) 51 (!) 54 70  Resp: '20 18 18 18  '$ Temp: 98.5 F (36.9 C)  98.2 F (36.8 C) 98.6 F (37 C)  TempSrc: Oral  Oral Oral  SpO2: 100%  98% 97%  Weight:      Height:        Intake/Output Summary (Last 24 hours) at 12/13/2022 0957 Last data filed at 12/12/2022 1700 Gross per 24 hour  Intake 480 ml  Output --  Net 480 ml    Filed Weights   12/09/22 1415  Weight: 89.4 kg    Examination:  General exam: Appears calm and comfortable  Respiratory system: Clear to auscultation. Respiratory effort normal. No respiratory distress. No conversational dyspnea.  Cardiovascular system: S1 & S2 heard, Bradycardic rate, irreg rhythm. No murmurs. No pedal edema. Gastrointestinal system: Abdomen is nondistended, soft and tender to palpation right hemiabdomen, bowel sounds present Central nervous system: Alert and oriented. No focal neurological deficits. Speech clear.  Extremities: Symmetric in appearance  Skin: No rashes, lesions or ulcers on exposed skin  Psychiatry: Judgement and insight appear normal. Mood & affect appropriate.   Data Reviewed: I have personally reviewed following labs and imaging studies  CBC: Recent Labs  Lab 12/09/22 1307 12/09/22 1326 12/10/22 0523 12/11/22 0450 12/12/22 0923  WBC 8.8  --  6.8 7.0 8.4  NEUTROABS 6.0  --   --   --   --   HGB 11.3* 12.9* 9.9* 9.7* 9.8*   HCT 36.7* 38.0* 31.7* 31.3* 32.3*  MCV 85.3  --  86.1 86.0 86.6  PLT 306  --  221 238 [id number]    Basic Metabolic Panel: Recent Labs  Lab 12/09/22 1307 12/09/22 1326 12/09/22 1812 12/10/22 0034 12/10/22 0523 12/11/22 0450 12/12/22 0546  NA 136 134* 134*  --  135 134* 134*  K 4.4 5.8* 4.5 4.5 5.1 4.2 4.5  CL 102 100 102  --  103 103 104  CO2 25  --  22  --  '22 23 22  '$ GLUCOSE 139* 136* 99  --  91 100* 125*  BUN '13 14 12  '$ --  '11 9 8  '$ CREATININE 1.11 1.00 1.08  --  1.23 1.10 1.09  CALCIUM 8.9  --  8.8*  --  8.6* 8.3* 8.3*    GFR: Estimated Creatinine Clearance: 57.3 mL/min (by C-G formula based on SCr of 1.09 mg/dL). Liver Function Tests: Recent Labs  Lab 12/09/22 1307 12/10/22 0523  AST 27 29  ALT 16 13  ALKPHOS 145* 124  BILITOT 0.5 0.8  PROT 6.7 5.8*  ALBUMIN 3.1* 2.6*    Recent Labs  Lab 12/09/22 1307  LIPASE 36    No results for input(s): "AMMONIA" in the last 168 hours. Coagulation Profile: No results for input(s): "INR", "PROTIME" in the last 168 hours. Cardiac Enzymes: No results for input(s): "CKTOTAL", "CKMB", "CKMBINDEX", "TROPONINI" in the last 168 hours. BNP (last 3 results) No results for input(s): "PROBNP" in the last 8760 hours. HbA1C: No results for input(s): "HGBA1C" in the last 72 hours. CBG: No results for input(s): "GLUCAP" in the last 168 hours.  Lipid Profile: No results for input(s): "CHOL", "HDL", "LDLCALC", "TRIG", "CHOLHDL", "LDLDIRECT" in the last 72 hours. Thyroid Function Tests: No results for input(s): "TSH", "T4TOTAL", "FREET4", "T3FREE", "THYROIDAB" in the last 72 hours. Anemia Panel: No results for input(s): "VITAMINB12", "FOLATE", "FERRITIN", "TIBC", "IRON", "RETICCTPCT" in the last 72 hours. Sepsis Labs: No results for input(s): "PROCALCITON", "LATICACIDVEN" in the last 168 hours.  No results found for this or any previous visit (from the past 240 hour(s)).    Radiology Studies: No results found.    Scheduled  Meds:  sodium chloride   Intravenous Once   feeding supplement  237 mL Oral TID BM   metoprolol succinate  12.5 mg Oral Daily   sodium chloride flush  3 mL Intravenous Q12H   Continuous Infusions:     LOS: 4 days   Time spent: 25 minutes   Dessa Phi, DO [facility] 12/13/2022, 9:57 AM   Available via Epic secure chat 7am-7pm After these hours, please refer to coverage provider listed on [email or web address]

## 2022-12-13 NOTE — Progress Notes (Signed)
Mobility Specialist Cancellation/Refusal Note:   Reason for Cancellation/Refusal: Pt declined mobility at this time. C/o stomach pain stating he wanted to rest at the moment. RN notified. Will check back as schedule permits.    Ferd Hibbs Mobility Specialist

## 2022-12-13 NOTE — Plan of Care (Signed)
  Problem: Education: Goal: Knowledge of General Education information will improve Description: Including pain rating scale, medication(s)/side effects and non-pharmacologic comfort measures Outcome: Completed/Met   

## 2022-12-14 ENCOUNTER — Encounter (HOSPITAL_COMMUNITY): Payer: Self-pay | Admitting: Internal Medicine

## 2022-12-14 ENCOUNTER — Inpatient Hospital Stay (HOSPITAL_COMMUNITY): Payer: Medicare HMO

## 2022-12-14 DIAGNOSIS — K56609 Unspecified intestinal obstruction, unspecified as to partial versus complete obstruction: Secondary | ICD-10-CM | POA: Diagnosis not present

## 2022-12-14 LAB — CBC
HCT: 32.6 % — ABNORMAL LOW (ref 39.0–52.0)
Hemoglobin: 9.8 g/dL — ABNORMAL LOW (ref 13.0–17.0)
MCH: 25.9 pg — ABNORMAL LOW (ref 26.0–34.0)
MCHC: 30.1 g/dL (ref 30.0–36.0)
MCV: 86 fL (ref 80.0–100.0)
Platelets: 227 10*3/uL (ref 150–400)
RBC: 3.79 MIL/uL — ABNORMAL LOW (ref 4.22–5.81)
RDW: 16.1 % — ABNORMAL HIGH (ref 11.5–15.5)
WBC: 6.5 10*3/uL (ref 4.0–10.5)
nRBC: 0 % (ref 0.0–0.2)

## 2022-12-14 LAB — CEA: CEA: 4.9 ng/mL — ABNORMAL HIGH (ref 0.0–4.7)

## 2022-12-14 LAB — BASIC METABOLIC PANEL
Anion gap: 8 (ref 5–15)
BUN: 10 mg/dL (ref 8–23)
CO2: 22 mmol/L (ref 22–32)
Calcium: 8 mg/dL — ABNORMAL LOW (ref 8.9–10.3)
Chloride: 103 mmol/L (ref 98–111)
Creatinine, Ser: 1.05 mg/dL (ref 0.61–1.24)
GFR, Estimated: >60 mL/min (ref >60)
Glucose, Bld: 88 mg/dL (ref 70–99)
Potassium: 4 mmol/L (ref 3.5–5.1)
Sodium: 133 mmol/L — ABNORMAL LOW (ref 135–145)

## 2022-12-14 LAB — SURGICAL PATHOLOGY

## 2022-12-14 MED ORDER — ONDANSETRON HCL 4 MG/2ML IJ SOLN
4.0000 mg | Freq: Four times a day (QID) | INTRAMUSCULAR | Status: DC | PRN
  Administered 2022-12-14 – 2022-12-24 (×6): 4 mg via INTRAVENOUS
  Filled 2022-12-14 (×7): qty 2

## 2022-12-14 MED ORDER — SODIUM CHLORIDE 0.9 % IV SOLN
12.5000 mg | Freq: Four times a day (QID) | INTRAVENOUS | Status: DC | PRN
  Administered 2022-12-14: 12.5 mg via INTRAVENOUS
  Filled 2022-12-14: qty 12.5
  Filled 2022-12-14: qty 0.5

## 2022-12-14 NOTE — Care Management Important Message (Signed)
Important Message  Patient Details IM Letter given. Name: Ian Estes MRN: [id number] Date of Birth: 10-22-1940   Medicare Important Message Given:  Yes     Kerin Salen 12/14/2022, 1:38 PM

## 2022-12-14 NOTE — Progress Notes (Signed)
PROGRESS NOTE    Ian Estes  [id number] DOB: Dec 26, 1940 DOA: 12/09/2022 PCP: Aletha Halim., PA-C     Brief Narrative:  Ian Estes is a 82 y.o. male with medical history significant of CKD stage III, A-fib on Eliquis, hypertension, hyperlipidemia who presents with abdominal pain and nausea.   He was admitted from 1/7 to 1/12 secondary to acute sigmoid diverticulitis with contained perforation/abscess.   He was also admitted again 1/21 to 1/25 secondary to recurrent diverticulitis.   Admitted again 2/14 to 2/16 secondary to small bowel obstruction.  At that time, his small bowel obstruction resolved with conservative measures.  He was recommended for outpatient colonoscopy.   He now returns with recurrent abdominal pain.  He states that he has been tolerating diet at home, abdominal pain started yesterday.  Significantly worsened today.  Denies any vomiting.  No other issues such as fevers, shortness of breath, chest pain or cough.  His last bowel movement was today, had a small amount.  He was admitted 2/21 for recurrent SBO.  General surgery was consulted.  SBO resolved and NGT removed. Due to hematochezia, GI consulted.  Patient underwent colonoscopy by Dr. Benson Norway 2/23.  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  New events last 24 hours / Subjective: Still having abdominal pain, tolerable with IV dilaudid. Awaiting surgical plan.   Assessment & Plan:  Principal Problem:   SBO (small bowel obstruction) (HCC) Active Problems:   Chronic atrial fibrillation (HCC)   Chronic kidney disease, stage 3a (HCC)   Hypertension   Colonic mass   Hyperkalemia   Recurrent small bowel obstruction, recent history of diverticulitis -General surgery following -SBO improved   Colon cancer -Status post colonoscopy 2/23, Dr. Benson Norway -Pathology pending -Awaiting surgical plan, will likely need right colectomy, sigmoid colectomy and small bowel resection this week     Hyperkalemia -Resolved   Chronic A-fib -Resume metoprolol, hold Eliquis (last dose 2/21 AM)     DVT prophylaxis:  SCDs Start: 12/09/22 1744  Code Status: DNR Family Communication: Sister at bedside  Disposition Plan:  Status is: Inpatient Remains inpatient appropriate because: Pending further surgical plan  Consultants:  General surgery GI  Antimicrobials:  Anti-infectives (From admission, onward)    None        Objective: Vitals:   12/13/22 1258 12/13/22 2019 12/14/22 0437 12/14/22 0918  BP: 137/70 116/73 109/65 95/69  Pulse: (!) 57 64 61 62  Resp: 16 (!) 22 17   Temp: 98.3 F (36.8 C) 99 F (37.2 C) 98 F (36.7 C)   TempSrc: Oral Oral Oral   SpO2: 97% 97% 98%   Weight:      Height:        Intake/Output Summary (Last 24 hours) at 12/14/2022 0933 Last data filed at 12/13/2022 1830 Gross per 24 hour  Intake 840 ml  Output --  Net 840 ml    Filed Weights   12/09/22 1415  Weight: 89.4 kg    Examination:  General exam: Appears calm and comfortable  Respiratory system: Clear to auscultation. Respiratory effort normal. No respiratory distress. No conversational dyspnea.  Cardiovascular system: S1 & S2 heard, Bradycardic rate, irreg rhythm. No murmurs. No pedal edema. Gastrointestinal system: Abdomen is mildly distended, soft and tender to palpation right hemiabdomen Central nervous system: Alert and oriented. No focal neurological deficits. Speech clear.  Extremities: Symmetric in appearance  Skin: No rashes, lesions or ulcers on exposed skin  Psychiatry: Judgement and insight appear normal. Mood & affect  appropriate.   Data Reviewed: I have personally reviewed following labs and imaging studies  CBC: Recent Labs  Lab 12/09/22 1307 12/09/22 1326 12/10/22 0523 12/11/22 0450 12/12/22 0923 12/14/22 0404  WBC 8.8  --  6.8 7.0 8.4 6.5  NEUTROABS 6.0  --   --   --   --   --   HGB 11.3* 12.9* 9.9* 9.7* 9.8* 9.8*  HCT 36.7* 38.0* 31.7* 31.3* 32.3*  32.6*  MCV 85.3  --  86.1 86.0 86.6 86.0  PLT 306  --  221 238 203 [id number]    Basic Metabolic Panel: Recent Labs  Lab 12/09/22 1812 12/10/22 0034 12/10/22 0523 12/11/22 0450 12/12/22 0546 12/14/22 0404  NA 134*  --  135 134* 134* 133*  K 4.5 4.5 5.1 4.2 4.5 4.0  CL 102  --  103 103 104 103  CO2 22  --  '22 23 22 22  '$ GLUCOSE 99  --  91 100* 125* 88  BUN 12  --  '11 9 8 10  '$ CREATININE 1.08  --  1.23 1.10 1.09 1.05  CALCIUM 8.8*  --  8.6* 8.3* 8.3* 8.0*    GFR: Estimated Creatinine Clearance: 59.5 mL/min (by C-G formula based on SCr of 1.05 mg/dL). Liver Function Tests: Recent Labs  Lab 12/09/22 1307 12/10/22 0523  AST 27 29  ALT 16 13  ALKPHOS 145* 124  BILITOT 0.5 0.8  PROT 6.7 5.8*  ALBUMIN 3.1* 2.6*    Recent Labs  Lab 12/09/22 1307  LIPASE 36    No results for input(s): "AMMONIA" in the last 168 hours. Coagulation Profile: No results for input(s): "INR", "PROTIME" in the last 168 hours. Cardiac Enzymes: No results for input(s): "CKTOTAL", "CKMB", "CKMBINDEX", "TROPONINI" in the last 168 hours. BNP (last 3 results) No results for input(s): "PROBNP" in the last 8760 hours. HbA1C: No results for input(s): "HGBA1C" in the last 72 hours. CBG: No results for input(s): "GLUCAP" in the last 168 hours.  Lipid Profile: No results for input(s): "CHOL", "HDL", "LDLCALC", "TRIG", "CHOLHDL", "LDLDIRECT" in the last 72 hours. Thyroid Function Tests: No results for input(s): "TSH", "T4TOTAL", "FREET4", "T3FREE", "THYROIDAB" in the last 72 hours. Anemia Panel: No results for input(s): "VITAMINB12", "FOLATE", "FERRITIN", "TIBC", "IRON", "RETICCTPCT" in the last 72 hours. Sepsis Labs: No results for input(s): "PROCALCITON", "LATICACIDVEN" in the last 168 hours.  No results found for this or any previous visit (from the past 240 hour(s)).    Radiology Studies: CT CHEST WO CONTRAST  Result Date: 12/14/2022 CLINICAL DATA:  Ascending colonic mass for staging EXAM: CT  CHEST WITHOUT CONTRAST TECHNIQUE: Multidetector CT imaging of the chest was performed following the standard protocol without IV contrast. RADIATION DOSE REDUCTION: This exam was performed according to the departmental dose-optimization program which includes automated exposure control, adjustment of the mA and/or kV according to patient size and/or use of iterative reconstruction technique. COMPARISON:  CTA chest dated 12/20/2018 FINDINGS: Cardiovascular: Normal heart size. No significant pericardial fluid/thickening. Coronary artery calcifications and aortic atherosclerosis. Great vessels are normal in course and caliber. Mediastinum/Nodes: Imaged thyroid gland without nodules meeting criteria for imaging follow-up by size. Normal esophagus. No pathologically enlarged axillary, supraclavicular, mediastinal, or hilar lymph nodes. Lungs/Pleura: Focus of adherent secretions along the right lateral distal trachea. The central airways are patent. Diffuse bronchial wall thickening and bilateral lower lobe mild bronchiectasis. No focal consolidation. Bilateral upper lobe calcified granulomata. Bilateral lower lobe subpleural scarring. No pneumothorax. Trace bilateral pleural effusions. Upper abdomen: Increased small volume  ascites. Distended gallbladder. Musculoskeletal: No acute or abnormal lytic or blastic osseous lesions. IMPRESSION: 1. No evidence of metastatic disease in the chest. 2. Trace bilateral pleural effusions. 3. Increased small volume ascites. 4. Aortic Atherosclerosis (ICD10-I70.0). Coronary artery calcifications. Assessment for potential risk factor modification, dietary therapy or pharmacologic therapy may be warranted, if clinically indicated. Electronically Signed   By: Darrin Nipper M.D.   On: 12/14/2022 09:26      Scheduled Meds:  sodium chloride   Intravenous Once   feeding supplement  237 mL Oral TID BM   metoprolol succinate  12.5 mg Oral Daily   sodium chloride flush  3 mL Intravenous Q12H    Continuous Infusions:     LOS: 5 days   Time spent: 25 minutes   Dessa Phi, DO [facility] 12/14/2022, 9:33 AM   Available via Epic secure chat 7am-7pm After these hours, please refer to coverage provider listed on [email or web address]

## 2022-12-14 NOTE — Progress Notes (Signed)
Mobility Specialist Cancellation/Refusal Note:   Reason for Cancellation/Refusal: Pt declined mobility at this time. Stated being sick and nauseated this afternoon. Will check back as schedule permits.   Ferd Hibbs Mobility Specialist

## 2022-12-14 NOTE — Anesthesia Preprocedure Evaluation (Signed)
Anesthesia Evaluation  Patient identified by MRN, date of birth, ID band Patient awake    Reviewed: Allergy & Precautions, NPO status , Patient's Chart, lab work & pertinent test results  History of Anesthesia Complications Negative for: history of anesthetic complications  Airway Mallampati: III  TM Distance: >3 FB Neck ROM: Full   Comment: Previous grade I view with Miller 3, mask with OPA Dental no notable dental hx. (+) Dental Advisory Given, Poor Dentition, Partial Upper, Partial Lower Missing several teeth. Denies loose teeth.:   Pulmonary neg shortness of breath, neg sleep apnea, neg COPD, neg recent URI, former smoker   Pulmonary exam normal breath sounds clear to auscultation       Cardiovascular hypertension, Pt. on home beta blockers (-) angina (-) Past MI, (-) Cardiac Stents and (-) CABG Normal cardiovascular exam+ dysrhythmias (on Eliquis) Atrial Fibrillation  Rhythm:Irregular Rate:Normal  HLD  TTE 12/20/2018: IMPRESSIONS     1. The left ventricle has normal systolic function with an ejection  fraction of 60-65%. The cavity size was normal. Left ventricular diastolic  Doppler parameters are indeterminate secondary to atrial fibrillation.   2. The right ventricle has normal systolic function. The cavity was  normal. There is no increase in right ventricular wall thickness.   3. Large pleural effusion.   4. There is excessive respiratory variation in the tricuspid valve  spectral Doppler velocities.   5. Large pericardial effusion measuring up to 2.5 cm anteriorly. There  appears to be a large thrombus anterior to RV. Cannot rule out a prominent  fat pad. However, there is free-flowing fluid both anterior and posterior,  making thrombus more likely.  There is no evidence of RA or RV diastolic collapse, but the IVC is  dilated and does not collapse. There is significant tricuspid valve inflow  variation. Mitral inflow  pattern is unremarkable. Findings concerning for  early tamponade.   6. The mitral valve is normal in structure.   7. The tricuspid valve is normal in structure.   8. The aortic valve was not well visualized.   9. The pulmonic valve was normal in structure.  10. The inferior vena cava was dilated in size with <50% respiratory  variability.     Neuro/Psych  PSYCHIATRIC DISORDERS Anxiety        GI/Hepatic Neg liver ROS,GERD  ,,Colon ca   Endo/Other  diabetes, Type 2 Hyperthyroidism   Renal/GU Renal InsufficiencyRenal diseaseLab Results      Component                Value               Date                      CREATININE               1.05                12/14/2022                BUN                      10                  12/14/2022                NA  133 (L)             12/14/2022                K                        4.0                 12/14/2022                CL                       103                 12/14/2022                CO2                      22                  12/14/2022                Musculoskeletal  (+) Arthritis ,    Abdominal   Peds  Hematology  (+) Blood dyscrasia, anemia Lab Results      Component                Value               Date                      WBC                      6.5                 12/14/2022                HGB                      9.8 (L)             12/14/2022                HCT                      32.6 (L)            12/14/2022                MCV                      86.0                12/14/2022                PLT                      227                 12/14/2022              Anesthesia Other Findings Gout, h/o prostate cancer  82 year old male with a PMH of a contained perforated sigmoid diverticulitis (10/2022), history of colonic polyps, HTN, hyperlipidemia, and CKD admitted for a recurrent small bowel obstruction.  This is the second admission this month for an SBO and previously it resolved  with conservative measures.  It appears that his SBO again resolved with an NG tube.  It is reported that he is passing flatus and he had some formed bowel movements today.  Todays's KUB shows that there is contrast in the colon and the SBO appears resolved.  He also experienced hematochezia and his HGB dropped from 12.9 down to 9.9 g/dL.  His baseline HGB ranges anywhere from 10-12 g/dL.  In 10/2022 he was admitted twice for a contained perforated sigmoid diverticulitis and a recurrence a couple of weeks later.  During the second January admission the CT scan showed that he had a mass in the ascending colon.     Reproductive/Obstetrics                              Anesthesia Physical Anesthesia Plan  ASA: 3  Anesthesia Plan: General   Post-op Pain Management: Ofirmev IV (intra-op)* and Precedex   Induction: Intravenous  PONV Risk Score and Plan: 3 and Treatment may vary due to age or medical condition and Ondansetron  Airway Management Planned: Oral ETT  Additional Equipment:   Intra-op Plan:   Post-operative Plan: Extubation in OR  Informed Consent: I have reviewed the patients History and Physical, chart, labs and discussed the procedure including the risks, benefits and alternatives for the proposed anesthesia with the patient or authorized representative who has indicated his/her understanding and acceptance.   Patient has DNR.  Suspend DNR.   Dental advisory given  Plan Discussed with: Anesthesiologist, CRNA and Surgeon  Anesthesia Plan Comments:        Anesthesia Quick Evaluation

## 2022-12-14 NOTE — Progress Notes (Signed)
3 Days Post-Op   Subjective/Chief Complaint: Continues to have mild abdominal pain, no vomiting.   Objective: Vital signs in last 24 hours: Temp:  [98 F (36.7 C)-99 F (37.2 C)] 98 F (36.7 C) (02/26 0437) Pulse Rate:  [57-64] 62 (02/26 0918) Resp:  [16-22] 17 (02/26 0437) BP: (95-137)/(65-73) 95/69 (02/26 0918) SpO2:  [97 %-98 %] 98 % (02/26 0437) Last BM Date : 12/10/22  Intake/Output from previous day: 02/25 0701 - 02/26 0700 In: 840 [P.O.:240; I.V.:600] Out: -  Intake/Output this shift: No intake/output data recorded.  General: resting comfortably, NAD Neuro: alert and oriented, no focal deficits Resp: normal work of breathing on room air Abdomen: soft, nondistended, mild RLQ tenderness to palpation. Well-healed lower midline surgical scar. Extremities: warm and well-perfused   Lab Results:  Recent Labs    12/12/22 0923 12/14/22 0404  WBC 8.4 6.5  HGB 9.8* 9.8*  HCT 32.3* 32.6*  PLT 203 227   BMET Recent Labs    12/12/22 0546 12/14/22 0404  NA 134* 133*  K 4.5 4.0  CL 104 103  CO2 22 22  GLUCOSE 125* 88  BUN 8 10  CREATININE 1.09 1.05  CALCIUM 8.3* 8.0*   PT/INR No results for input(s): "LABPROT", "INR" in the last 72 hours. ABG No results for input(s): "PHART", "HCO3" in the last 72 hours.  Invalid input(s): "PCO2", "PO2"  Studies/Results: CT CHEST WO CONTRAST  Result Date: 12/14/2022 CLINICAL DATA:  Ascending colonic mass for staging EXAM: CT CHEST WITHOUT CONTRAST TECHNIQUE: Multidetector CT imaging of the chest was performed following the standard protocol without IV contrast. RADIATION DOSE REDUCTION: This exam was performed according to the departmental dose-optimization program which includes automated exposure control, adjustment of the mA and/or kV according to patient size and/or use of iterative reconstruction technique. COMPARISON:  CTA chest dated 12/20/2018 FINDINGS: Cardiovascular: Normal heart size. No significant pericardial  fluid/thickening. Coronary artery calcifications and aortic atherosclerosis. Great vessels are normal in course and caliber. Mediastinum/Nodes: Imaged thyroid gland without nodules meeting criteria for imaging follow-up by size. Normal esophagus. No pathologically enlarged axillary, supraclavicular, mediastinal, or hilar lymph nodes. Lungs/Pleura: Focus of adherent secretions along the right lateral distal trachea. The central airways are patent. Diffuse bronchial wall thickening and bilateral lower lobe mild bronchiectasis. No focal consolidation. Bilateral upper lobe calcified granulomata. Bilateral lower lobe subpleural scarring. No pneumothorax. Trace bilateral pleural effusions. Upper abdomen: Increased small volume ascites. Distended gallbladder. Musculoskeletal: No acute or abnormal lytic or blastic osseous lesions. IMPRESSION: 1. No evidence of metastatic disease in the chest. 2. Trace bilateral pleural effusions. 3. Increased small volume ascites. 4. Aortic Atherosclerosis (ICD10-I70.0). Coronary artery calcifications. Assessment for potential risk factor modification, dietary therapy or pharmacologic therapy may be warranted, if clinically indicated. Electronically Signed   By: Darrin Nipper M.D.   On: 12/14/2022 09:26    Anti-infectives: Anti-infectives (From admission, onward)    None       Assessment/Plan: s/p Procedure(s): COLONOSCOPY WITH PROPOFOL (N/A) BIOPSY SUBMUCOSAL TATTOO INJECTION POLYPECTOMY   SBO, recurrent Patient with recent admission for diverticulitis in Jan 2024. During that admission he underwent IR drain placement. There was concern that the drain migrated through SB lumen and the drain was removed 1/22. He was just admitted last week for SBO that was felt likely 2/2 to above (also hx of prior prostatectomy so possible of intra-abdominal adhesions). He improved and was able to be discahrged on 2/16. Returned 2/21 with likely partial sbo.  - SBO resolved. - Colonoscopy  with large non-obstructing malignant appearing mass. Path pending. Patient will need right hemicolectomy. Plan for OR tomorrow. - Has had very minimal solid food since colonoscopy prep. Will keep on clear liquids today, NPO at 4am tomorrow for surgery. I reviewed the details of the planned surgery with the patient and his sister today. - Staging CT chest pending    LOS: 5 days    Dwan Bolt 12/14/2022

## 2022-12-15 ENCOUNTER — Encounter (HOSPITAL_COMMUNITY)
Admission: EM | Disposition: A | Discharge status: Skilled Nursing Facility | Payer: Self-pay | Source: Home / Self Care | Attending: Internal Medicine

## 2022-12-15 ENCOUNTER — Other Ambulatory Visit: Payer: Self-pay

## 2022-12-15 ENCOUNTER — Encounter (HOSPITAL_COMMUNITY): Payer: Self-pay | Admitting: Internal Medicine

## 2022-12-15 ENCOUNTER — Inpatient Hospital Stay (HOSPITAL_COMMUNITY): Payer: Medicare HMO | Admitting: Anesthesiology

## 2022-12-15 DIAGNOSIS — I10 Essential (primary) hypertension: Secondary | ICD-10-CM

## 2022-12-15 DIAGNOSIS — Z87891 Personal history of nicotine dependence: Secondary | ICD-10-CM

## 2022-12-15 DIAGNOSIS — C189 Malignant neoplasm of colon, unspecified: Secondary | ICD-10-CM

## 2022-12-15 DIAGNOSIS — I4891 Unspecified atrial fibrillation: Secondary | ICD-10-CM

## 2022-12-15 DIAGNOSIS — K56609 Unspecified intestinal obstruction, unspecified as to partial versus complete obstruction: Secondary | ICD-10-CM | POA: Diagnosis not present

## 2022-12-15 HISTORY — PX: COLON RESECTION: SHX5231

## 2022-12-15 LAB — GLUCOSE, CAPILLARY
Glucose-Capillary: 82 mg/dL (ref 70–99)
Glucose-Capillary: 105 mg/dL — ABNORMAL HIGH (ref 70–99)

## 2022-12-15 SURGERY — LAPAROSCOPIC RIGHT COLON RESECTION
Anesthesia: General

## 2022-12-15 MED ORDER — SODIUM CHLORIDE 0.9 % IR SOLN
Status: DC | PRN
  Administered 2022-12-15: 2000 mL

## 2022-12-15 MED ORDER — ACETAMINOPHEN 10 MG/ML IV SOLN
1000.0000 mg | Freq: Four times a day (QID) | INTRAVENOUS | Status: AC
  Administered 2022-12-15 – 2022-12-16 (×4): 1000 mg via INTRAVENOUS
  Filled 2022-12-15 (×4): qty 100

## 2022-12-15 MED ORDER — ACETAMINOPHEN 10 MG/ML IV SOLN
INTRAVENOUS | Status: DC | PRN
  Administered 2022-12-15: 1000 mg via INTRAVENOUS

## 2022-12-15 MED ORDER — FENTANYL CITRATE (PF) 100 MCG/2ML IJ SOLN
INTRAMUSCULAR | Status: AC
  Filled 2022-12-15: qty 2

## 2022-12-15 MED ORDER — ENOXAPARIN SODIUM 40 MG/0.4ML IJ SOSY
40.0000 mg | PREFILLED_SYRINGE | INTRAMUSCULAR | Status: DC
  Administered 2022-12-16 – 2022-12-19 (×4): 40 mg via SUBCUTANEOUS
  Filled 2022-12-15 (×4): qty 0.4

## 2022-12-15 MED ORDER — LIDOCAINE 5 % EX PTCH
1.0000 | MEDICATED_PATCH | CUTANEOUS | Status: DC
  Administered 2022-12-15 – 2022-12-23 (×9): 1 via TRANSDERMAL
  Filled 2022-12-15 (×10): qty 1

## 2022-12-15 MED ORDER — FENTANYL CITRATE (PF) 100 MCG/2ML IJ SOLN
INTRAMUSCULAR | Status: DC | PRN
  Administered 2022-12-15 (×4): 50 ug via INTRAVENOUS

## 2022-12-15 MED ORDER — SODIUM CHLORIDE (PF) 0.9 % IJ SOLN
INTRAMUSCULAR | Status: AC
  Filled 2022-12-15: qty 20

## 2022-12-15 MED ORDER — SODIUM CHLORIDE 0.9 % IV SOLN
2.0000 g | INTRAVENOUS | Status: AC
  Administered 2022-12-15: 2 g via INTRAVENOUS

## 2022-12-15 MED ORDER — ROCURONIUM BROMIDE 10 MG/ML (PF) SYRINGE
PREFILLED_SYRINGE | INTRAVENOUS | Status: AC
  Filled 2022-12-15: qty 10

## 2022-12-15 MED ORDER — BUPIVACAINE LIPOSOME 1.3 % IJ SUSP
INTRAMUSCULAR | Status: AC
  Filled 2022-12-15: qty 20

## 2022-12-15 MED ORDER — METOPROLOL TARTRATE 5 MG/5ML IV SOLN
5.0000 mg | Freq: Four times a day (QID) | INTRAVENOUS | Status: DC
  Administered 2022-12-15 – 2022-12-17 (×6): 5 mg via INTRAVENOUS
  Filled 2022-12-15 (×7): qty 5

## 2022-12-15 MED ORDER — LACTATED RINGERS IV SOLN: INTRAVENOUS | Status: DC | PRN

## 2022-12-15 MED ORDER — PROPOFOL 10 MG/ML IV BOLUS
INTRAVENOUS | Status: DC | PRN
  Administered 2022-12-15: 120 mg via INTRAVENOUS

## 2022-12-15 MED ORDER — LACTATED RINGERS IV SOLN: Freq: Once | INTRAVENOUS | Status: AC

## 2022-12-15 MED ORDER — ONDANSETRON HCL 4 MG/2ML IJ SOLN
INTRAMUSCULAR | Status: AC
  Filled 2022-12-15: qty 2

## 2022-12-15 MED ORDER — METHOCARBAMOL 1000 MG/10ML IJ SOLN
500.0000 mg | Freq: Three times a day (TID) | INTRAVENOUS | Status: DC | PRN
  Administered 2022-12-18: 500 mg via INTRAVENOUS
  Filled 2022-12-15: qty 500

## 2022-12-15 MED ORDER — SODIUM CHLORIDE 0.9 % IV SOLN
INTRAVENOUS | Status: AC
  Filled 2022-12-15: qty 2

## 2022-12-15 MED ORDER — LACTATED RINGERS IR SOLN
Status: DC | PRN
  Administered 2022-12-15: 1000 mL

## 2022-12-15 MED ORDER — ONDANSETRON HCL 4 MG/2ML IJ SOLN
INTRAMUSCULAR | Status: DC | PRN
  Administered 2022-12-15: 4 mg via INTRAVENOUS

## 2022-12-15 MED ORDER — CHLORHEXIDINE GLUCONATE CLOTH 2 % EX PADS
6.0000 | MEDICATED_PAD | Freq: Once | CUTANEOUS | Status: DC
  Administered 2022-12-15: 6 via TOPICAL

## 2022-12-15 MED ORDER — LIDOCAINE HCL 2 % IJ SOLN
INTRAMUSCULAR | Status: AC
  Filled 2022-12-15: qty 20

## 2022-12-15 MED ORDER — KETAMINE HCL 10 MG/ML IJ SOLN
INTRAMUSCULAR | Status: DC | PRN
  Administered 2022-12-15: 20 mg via INTRAVENOUS

## 2022-12-15 MED ORDER — MIDAZOLAM HCL 2 MG/2ML IJ SOLN
INTRAMUSCULAR | Status: DC | PRN
  Administered 2022-12-15: .5 mg via INTRAVENOUS

## 2022-12-15 MED ORDER — SODIUM CHLORIDE (PF) 0.9 % IJ SOLN
INTRAMUSCULAR | Status: DC | PRN
  Administered 2022-12-15: 40 mL

## 2022-12-15 MED ORDER — DEXMEDETOMIDINE HCL IN NACL 80 MCG/20ML IV SOLN
INTRAVENOUS | Status: DC | PRN
  Administered 2022-12-15: 4 ug via BUCCAL

## 2022-12-15 MED ORDER — ACETAMINOPHEN 10 MG/ML IV SOLN
INTRAVENOUS | Status: AC
  Filled 2022-12-15: qty 100

## 2022-12-15 MED ORDER — AMISULPRIDE (ANTIEMETIC) 5 MG/2ML IV SOLN
10.0000 mg | Freq: Once | INTRAVENOUS | Status: AC
  Administered 2022-12-15: 10 mg via INTRAVENOUS

## 2022-12-15 MED ORDER — HYDROMORPHONE HCL 1 MG/ML IJ SOLN: 0.2500 mg | INTRAMUSCULAR | Status: DC | PRN

## 2022-12-15 MED ORDER — HYDROMORPHONE HCL 1 MG/ML IJ SOLN
INTRAMUSCULAR | Status: DC | PRN
  Administered 2022-12-15: .5 mg via INTRAVENOUS

## 2022-12-15 MED ORDER — LIDOCAINE 2% (20 MG/ML) 5 ML SYRINGE
INTRAMUSCULAR | Status: DC | PRN
  Administered 2022-12-15: 60 mg via INTRAVENOUS

## 2022-12-15 MED ORDER — MIDAZOLAM HCL 2 MG/2ML IJ SOLN
INTRAMUSCULAR | Status: AC
  Filled 2022-12-15: qty 2

## 2022-12-15 MED ORDER — AMISULPRIDE (ANTIEMETIC) 5 MG/2ML IV SOLN
INTRAVENOUS | Status: AC
  Filled 2022-12-15: qty 4

## 2022-12-15 MED ORDER — CHLORHEXIDINE GLUCONATE 0.12 % MT SOLN
15.0000 mL | Freq: Once | OROMUCOSAL | Status: AC
  Administered 2022-12-15: 15 mL via OROMUCOSAL

## 2022-12-15 MED ORDER — HYDROMORPHONE HCL 2 MG/ML IJ SOLN
INTRAMUSCULAR | Status: AC
  Filled 2022-12-15: qty 1

## 2022-12-15 MED ORDER — LIDOCAINE HCL (PF) 2 % IJ SOLN
INTRAMUSCULAR | Status: DC | PRN
  Administered 2022-12-15: 1.5 mg/kg/h

## 2022-12-15 MED ORDER — HYDROMORPHONE HCL 1 MG/ML IJ SOLN
0.5000 mg | INTRAMUSCULAR | Status: DC | PRN
  Administered 2022-12-15 – 2022-12-16 (×6): 0.5 mg via INTRAVENOUS
  Filled 2022-12-15 (×6): qty 0.5

## 2022-12-15 MED ORDER — PHENYLEPHRINE 80 MCG/ML (10ML) SYRINGE FOR IV PUSH (FOR BLOOD PRESSURE SUPPORT)
PREFILLED_SYRINGE | INTRAVENOUS | Status: DC | PRN
  Administered 2022-12-15: 80 ug via INTRAVENOUS
  Administered 2022-12-15 (×2): 120 ug via INTRAVENOUS

## 2022-12-15 MED ORDER — ROCURONIUM BROMIDE 10 MG/ML (PF) SYRINGE
PREFILLED_SYRINGE | INTRAVENOUS | Status: DC | PRN
  Administered 2022-12-15: 60 mg via INTRAVENOUS
  Administered 2022-12-15: 40 mg via INTRAVENOUS

## 2022-12-15 MED ORDER — SUGAMMADEX SODIUM 500 MG/5ML IV SOLN
INTRAVENOUS | Status: DC | PRN
  Administered 2022-12-15: 250 mg via INTRAVENOUS

## 2022-12-15 MED ORDER — DEXMEDETOMIDINE HCL IN NACL 80 MCG/20ML IV SOLN
INTRAVENOUS | Status: AC
  Filled 2022-12-15: qty 20

## 2022-12-15 MED ORDER — KETAMINE HCL 50 MG/5ML IJ SOSY
PREFILLED_SYRINGE | INTRAMUSCULAR | Status: AC
  Filled 2022-12-15: qty 5

## 2022-12-15 SURGICAL SUPPLY — 67 items
APPLIER CLIP 5 13 M/L LIGAMAX5 (MISCELLANEOUS)
BAG COUNTER SPONGE SURGICOUNT (BAG) ×1 IMPLANT
BLADE EXTENDED COATED 6.5IN (ELECTRODE) IMPLANT
CABLE HIGH FREQUENCY MONO STRZ (ELECTRODE) IMPLANT
CELLS DAT CNTRL 66122 CELL SVR (MISCELLANEOUS) IMPLANT
CHLORAPREP W/TINT 26 (MISCELLANEOUS) ×1 IMPLANT
CLIP APPLIE 5 13 M/L LIGAMAX5 (MISCELLANEOUS) IMPLANT
DERMABOND ADVANCED .7 DNX12 (GAUZE/BANDAGES/DRESSINGS) ×1 IMPLANT
DRAIN CHANNEL 19F RND (DRAIN) IMPLANT
DRAPE LAPAROSCOPIC ABDOMINAL (DRAPES) ×1 IMPLANT
DRAPE SURG IRRIG POUCH 19X23 (DRAPES) ×1 IMPLANT
DRSG OPSITE POSTOP 4X10 (GAUZE/BANDAGES/DRESSINGS) IMPLANT
DRSG OPSITE POSTOP 4X6 (GAUZE/BANDAGES/DRESSINGS) IMPLANT
DRSG OPSITE POSTOP 4X8 (GAUZE/BANDAGES/DRESSINGS) IMPLANT
ELECT REM PT RETURN 15FT ADLT (MISCELLANEOUS) ×1 IMPLANT
EVACUATOR SILICONE 100CC (DRAIN) IMPLANT
GAUZE SPONGE 4X4 12PLY STRL (GAUZE/BANDAGES/DRESSINGS) IMPLANT
GLOVE BIO SURGEON STRL SZ 6.5 (GLOVE) ×2 IMPLANT
GLOVE BIOGEL PI IND STRL 7.0 (GLOVE) ×2 IMPLANT
GLOVE INDICATOR 6.5 STRL GRN (GLOVE) ×1 IMPLANT
GOWN STRL REUS W/ TWL XL LVL3 (GOWN DISPOSABLE) ×6 IMPLANT
GOWN STRL REUS W/TWL XL LVL3 (GOWN DISPOSABLE) ×6
HOLDER FOLEY CATH W/STRAP (MISCELLANEOUS) ×1 IMPLANT
IRRIG SUCT STRYKERFLOW 2 WTIP (MISCELLANEOUS) ×1
IRRIGATION SUCT STRKRFLW 2 WTP (MISCELLANEOUS) ×1 IMPLANT
KIT TURNOVER KIT A (KITS) IMPLANT
LIGASURE BLUNT TIP 5 LONG 44CM (ELECTROSURGICAL) IMPLANT
PACK COLON (CUSTOM PROCEDURE TRAY) ×1 IMPLANT
PAD POSITIONING PINK XL (MISCELLANEOUS) ×1 IMPLANT
PENCIL SMOKE EVACUATOR (MISCELLANEOUS) IMPLANT
PROTECTOR NERVE ULNAR (MISCELLANEOUS) ×1 IMPLANT
RELOAD PROXIMATE 75MM BLUE (ENDOMECHANICALS) ×2 IMPLANT
RELOAD STAPLE 75 3.8 BLU REG (ENDOMECHANICALS) IMPLANT
RETRACTOR WND ALEXIS 18 MED (MISCELLANEOUS) IMPLANT
RTRCTR WOUND ALEXIS 18CM MED (MISCELLANEOUS)
SCISSORS LAP 5X35 DISP (ENDOMECHANICALS) ×1 IMPLANT
SEALER TISSUE G2 STRG ARTC 35C (ENDOMECHANICALS) ×1 IMPLANT
SET TUBE SMOKE EVAC HIGH FLOW (TUBING) ×1 IMPLANT
SLEEVE Z-THREAD 5X100MM (TROCAR) ×1 IMPLANT
SPIKE FLUID TRANSFER (MISCELLANEOUS) ×1 IMPLANT
SPONGE DRAIN TRACH 4X4 STRL 2S (GAUZE/BANDAGES/DRESSINGS) IMPLANT
STAPLER GUN LINEAR PROX 60 (STAPLE) IMPLANT
SURGILUBE 2OZ TUBE FLIPTOP (MISCELLANEOUS) ×1 IMPLANT
SUT ETHILON 2 0 PS N (SUTURE) IMPLANT
SUT NOVA NAB DX-16 0-1 5-0 T12 (SUTURE) ×2 IMPLANT
SUT PDS AB 1 TP1 96 (SUTURE) IMPLANT
SUT PROLENE 2 0 KS (SUTURE) IMPLANT
SUT SILK 2 0 (SUTURE) ×1
SUT SILK 2 0 SH CR/8 (SUTURE) IMPLANT
SUT SILK 2-0 18XBRD TIE 12 (SUTURE) ×1 IMPLANT
SUT SILK 3 0 (SUTURE)
SUT SILK 3 0 SH CR/8 (SUTURE) ×1 IMPLANT
SUT SILK 3-0 18XBRD TIE 12 (SUTURE) IMPLANT
SUT VIC AB 2-0 SH 18 (SUTURE) ×1 IMPLANT
SUT VIC AB 3-0 SH 27 (SUTURE) ×1
SUT VIC AB 3-0 SH 27X BRD (SUTURE) IMPLANT
SUT VIC AB 4-0 PS2 27 (SUTURE) ×1 IMPLANT
SUT VICRYL 0 UR6 27IN ABS (SUTURE) ×1 IMPLANT
SYS LAPSCP GELPORT 120MM (MISCELLANEOUS)
SYS WOUND ALEXIS 18CM MED (MISCELLANEOUS) ×1
SYSTEM LAPSCP GELPORT 120MM (MISCELLANEOUS) IMPLANT
SYSTEM WOUND ALEXIS 18CM MED (MISCELLANEOUS) ×1 IMPLANT
TOWEL OR NON WOVEN STRL DISP B (DISPOSABLE) ×1 IMPLANT
TRAY FOLEY MTR SLVR 16FR STAT (SET/KITS/TRAYS/PACK) IMPLANT
TROCAR BALLN 12MMX100 BLUNT (TROCAR) IMPLANT
TROCAR Z-THREAD OPTICAL 5X100M (TROCAR) ×1 IMPLANT
TUBING CONNECTING 10 (TUBING) ×1 IMPLANT

## 2022-12-15 NOTE — Op Note (Signed)
Date: 12/15/22  Patient: Ian Estes MRN: [id number]  Preoperative Diagnosis: Right colon mass Postoperative Diagnosis: Right colon mass, small bowel obstruction secondary to intraabdominal adhesions  Procedure: Laparoscopic converted to open right hemicolectomy with ileocolic reanastomosis, lysis of adhesions  Surgeon: Michaelle Birks, MD Assistant: Obie Dredge, PA-C  EBL: Minimal  Anesthesia: General endotracheal  Specimens: Right colon, stitch marks distal margin  Indications: Mr. Kalajian is an 82 yo male with a recent history of sigmoid diverticulitis and concerning for an ascending colon mass on CT, who presented with a small bowel obstruction and hematochezia. His obstructive symptoms improved. He recently underwent a colonoscopy on 2/23, which confirmed a malignant mass in the ascending colon. Biopsies showed invasive adenocarcinoma, and staging workup did not show any evidence of metastatic disease. After a discussion of the risks and benefits of surgery, he agreed to proceed with a right hemicolectomy.  Findings: Obstruction of the proximal jejunum due to a long adhesive band between the central small bowel mesentery and the sigmoid colon, presumably secondary to previous diverticulitis with drain placement within the small bowel. Large firm mass in the cecum, corresponding to colonoscopy findings. No evidence of metastatic disease within the abdomen.  Procedure details: Informed consent was obtained in the preoperative area prior to the procedure. The patient was brought to the operating room and placed on the table in the supine position. General anesthesia was induced and appropriate lines and drains were placed for intraoperative monitoring. Perioperative antibiotics were administered per SCIP guidelines. The abdomen was prepped and draped in the usual sterile fashion. A pre-procedure timeout was taken verifying patient identity, surgical site and procedure to be performed.  A  midline skin incision was made at the umbilicus and the subcutaneous tissue was divided with cautery. The fascia was elevated and opened along the linea alba, and the peritoneum was opened. No adhesions to the abdominal wall were present. A small wound protector was placed with a Gelport cap and the abdomen was insufflated. 75m ports were placed in the RLQ and RUQ under direct visualization. The right colon was examined and there was a firm mass in the cecum, with tattooing visible just distal to the mass. The ligament of Treitz was then identified and the small bowel was run. There was an adhesive band crossing the proximal jejunum, and this was sharply divided. This appeared to be adjacent to inflammatory tissue within the central small bowel mesentery. Just distal to this a loop of jejunum appeared to be tethered to the mesentery, and could not be fully visualized laparoscopically. The decision was made to mobilize the right colon laparoscopically, prior to creating a mini laparotomy to further examine the small bowel. Ligasure was used to mobilize the ascending colon in a lateral to medial fashion along the line of Toldt. The hepatic flexure was then taken down using Ligasure. The duodenum was visualized and protected. Once the right colon was fully mobilized, the Gelport cap was removed and the abdomen was desufflated. The umbilical incision was extended superiorly and a larger wound protector was placed. The small bowel was eviscerated and run from the ligament of Treitz to the terminal ileum. Another adhesive band was identified in the proximal jejunum, which was very long and extended to the very redundant sigmoid colon. This band was sharply lysed and this completely freed the small bowel. There were some adhesions between the sigmoid colon and terminal ileum, which were taken down with blunt dissection and cautery. All of these findings were presumably secondary to  the patient's previous perforated  diverticulitis, for which he had a percutaneous drain placed that migrated into the small bowel. The remainder of the small bowel was normal in appearance. The small bowel was placed back into the abdomen and the right colon and proximal transverse colon were exteriorized. The ileocolic pedicle was identified. A mesenteric window was created in the terminal ileum about 15cm proximal to the ileocecal valve, and the ileum was divided at this point using a 38m GIA stapler with a blue load. Next the middle colic vessels were identified, and a mesenteric window was created next to the colon just proximal to the middle colic vessels. The colon was divided with a 786mGIA stapler with a blue load. The intervening mesentery was divided with Ligasure until the ileocolic pedicle was isolated. A high ligation of the ileocolic pedical was performed with a 2-0 silk suture ligature. The specimen was removed, the distal margin was marked, and the specimen was sent for routine pathology.  Next the end of the ileum was laid next to the end of the transverse colon. An enterotomy and colotomy were created with cautery, and a side-to-side antiperistaltic ileocolic anastomosis was created with a 7562mIA stapler with a blue load. The common enterotomy was closed with a TA 60 blue stapler. A 3-0 silk stay suture was placed in the apex of the anastomosis. The anastomosis was palpated and was widely patent. The anastomosis was placed back into the abdomen. The abdomen was irrigated and blood was welling in the RLQ. This was found to be from bleeding at the RLQ port site. The port site was oversewn on the inside with a 2-0 Vicryl suture, which achieved hemostasis. No further intraabdominal bleeding was noted. The small bowel was again run and was patent with no sites of obstruction or injury. The wound protector was removed and new sterile drapes were applied per the colorectal ERAS protocol. The fascia was closed at midline with a  running looped 1 PDS suture, and Scarpa's layer was closed with a running 3-0 Vicryl suture. The skin at midline and the port sites was closed with subcuticular 4-0 monocryl suture. Steristrips and a sterile dressing were applied.  The patient tolerated the procedure well with no apparent complications. All counts were correct x2 at the end of the procedure. The patient was extubated and taken to PACU in stable condition.  SheMichaelle BirksD 12/15/22 1:26 PM

## 2022-12-15 NOTE — Anesthesia Procedure Notes (Signed)
Procedure Name: Intubation Date/Time: 12/15/2022 11:20 AM  Performed by: Claudia Desanctis, CRNAPre-anesthesia Checklist: Patient identified, Emergency Drugs available, Suction available and Patient being monitored Patient Re-evaluated:Patient Re-evaluated prior to induction Oxygen Delivery Method: Circle system utilized Preoxygenation: Pre-oxygenation with 100% oxygen Induction Type: IV induction Ventilation: Mask ventilation without difficulty and Oral airway inserted - appropriate to patient size Laryngoscope Size: 2 and Miller Grade View: Grade I Tube type: Oral Tube size: 7.5 mm Number of attempts: 1 Airway Equipment and Method: Stylet Placement Confirmation: ETT inserted through vocal cords under direct vision, positive ETCO2 and breath sounds checked- equal and bilateral Secured at: 21 cm Tube secured with: Tape Dental Injury: Teeth and Oropharynx as per pre-operative assessment

## 2022-12-15 NOTE — Progress Notes (Addendum)
PROGRESS NOTE    Ian Estes  [id number] DOB: 1941-04-12 DOA: 12/09/2022 PCP: Aletha Halim., PA-C     Brief Narrative:  Ian Estes is a 82 y.o. male with medical history significant of CKD stage III, A-fib on Eliquis, hypertension, hyperlipidemia who presents with abdominal pain and nausea.   He was admitted from 1/7 to 1/12 secondary to acute sigmoid diverticulitis with contained perforation/abscess.   He was also admitted again 1/21 to 1/25 secondary to recurrent diverticulitis.   Admitted again 2/14 to 2/16 secondary to small bowel obstruction.  At that time, his small bowel obstruction resolved with conservative measures.  He was recommended for outpatient colonoscopy.   He now returns with recurrent abdominal pain.  He states that he has been tolerating diet at home, abdominal pain started yesterday.  Significantly worsened today.  Denies any vomiting.  No other issues such as fevers, shortness of breath, chest pain or cough.  His last bowel movement was today, had a small amount.  He was admitted 2/21 for recurrent SBO.  General surgery was consulted.  SBO resolved and NGT removed. Due to hematochezia, GI consulted.  Patient underwent colonoscopy by Dr. Benson Norway 2/23.  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  New events last 24 hours / Subjective: Patient seen in preop.  Had 1 episode of nausea and vomiting last night.  Continues to have abdominal pain.  No other complaints.  Assessment & Plan:  Principal Problem:   SBO (small bowel obstruction) (HCC) Active Problems:   Chronic atrial fibrillation (HCC)   Chronic kidney disease, stage 3a (HCC)   Hypertension   Colonic mass   Hyperkalemia   Recurrent small bowel obstruction, recent history of diverticulitis Ascending colon cancer -Status post colonoscopy 2/23, Dr. Benson Norway -Pathology revealed invasive  moderately differentiated adenocarcinoma -CEA 4.9 -Discussed with Dr. Chryl Heck, oncology today. Plan to await hemicolectomy pathology and have patient establish in office outpatient. Outpatient referral to hematology/oncology placed -Right hemicolectomy today   Hyperkalemia -Resolved   Chronic A-fib -Resume metoprolol, hold Eliquis (last dose 2/21 AM)     DVT prophylaxis:  SCD's Start: 12/15/22 0907 SCDs Start: 12/09/22 1744  Code Status: DNR Family Communication: Sister at bedside  Disposition Plan:  Status is: Inpatient Remains inpatient appropriate because: Surgery today  Consultants:  General surgery GI  Antimicrobials:  Anti-infectives (From admission, onward)    Start     Dose/Rate Route Frequency Ordered Stop   12/15/22 0915  cefoTEtan (CEFOTAN) 2 g in sodium chloride 0.9 % 100 mL IVPB        2 g 200 mL/hr over 30 Minutes Intravenous On call to O.R. 12/15/22 0906 12/16/22 0559   12/15/22 0909  sodium chloride 0.9 % with cefoTEtan (CEFOTAN) ADS Med       Note to Pharmacy: [name] B: cabinet override      12/15/22 0909 12/15/22 2114        Objective: Vitals:   12/14/22 2028 12/15/22 0610 12/15/22 0700 12/15/22 0807  BP: 134/67 (!) 120/55 (!) 141/57   Pulse: 78 69    Resp:   16   Temp: 97.8 F (36.6 C) 98 F (36.7 C) (!) 97.5 F (36.4 C)   TempSrc: Oral Oral Oral   SpO2: 96% 98% 100%   Weight:    89.4 kg  Height:    6' (1.829 m)    Intake/Output Summary (Last 24 hours) at 12/15/2022 1021 Last data  filed at 12/15/2022 0600 Gross per 24 hour  Intake 290 ml  Output 200 ml  Net 90 ml    Filed Weights   12/09/22 1415 12/15/22 0807  Weight: 89.4 kg 89.4 kg    Examination:  General exam: Appears calm and comfortable  Respiratory system: Clear to auscultation. Respiratory effort normal. No respiratory distress. No conversational dyspnea.  Cardiovascular system: S1 & S2 heard, RR. No murmurs. No pedal edema. Gastrointestinal system: Abdomen is  mildly distended, soft and tender to palpation right hemiabdomen Central nervous system: Alert and oriented. No focal neurological deficits. Speech clear.  Extremities: Symmetric in appearance  Skin: No rashes, lesions or ulcers on exposed skin  Psychiatry: Judgement and insight appear normal. Mood & affect appropriate.   Data Reviewed: I have personally reviewed following labs and imaging studies  CBC: Recent Labs  Lab 12/09/22 1307 12/09/22 1326 12/10/22 0523 12/11/22 0450 12/12/22 0923 12/14/22 0404  WBC 8.8  --  6.8 7.0 8.4 6.5  NEUTROABS 6.0  --   --   --   --   --   HGB 11.3* 12.9* 9.9* 9.7* 9.8* 9.8*  HCT 36.7* 38.0* 31.7* 31.3* 32.3* 32.6*  MCV 85.3  --  86.1 86.0 86.6 86.0  PLT 306  --  221 238 203 [id number]    Basic Metabolic Panel: Recent Labs  Lab 12/09/22 1812 12/10/22 0034 12/10/22 0523 12/11/22 0450 12/12/22 0546 12/14/22 0404  NA 134*  --  135 134* 134* 133*  K 4.5 4.5 5.1 4.2 4.5 4.0  CL 102  --  103 103 104 103  CO2 22  --  '22 23 22 22  '$ GLUCOSE 99  --  91 100* 125* 88  BUN 12  --  '11 9 8 10  '$ CREATININE 1.08  --  1.23 1.10 1.09 1.05  CALCIUM 8.8*  --  8.6* 8.3* 8.3* 8.0*    GFR: Estimated Creatinine Clearance: 59.5 mL/min (by C-G formula based on SCr of 1.05 mg/dL). Liver Function Tests: Recent Labs  Lab 12/09/22 1307 12/10/22 0523  AST 27 29  ALT 16 13  ALKPHOS 145* 124  BILITOT 0.5 0.8  PROT 6.7 5.8*  ALBUMIN 3.1* 2.6*    Recent Labs  Lab 12/09/22 1307  LIPASE 36    No results for input(s): "AMMONIA" in the last 168 hours. Coagulation Profile: No results for input(s): "INR", "PROTIME" in the last 168 hours. Cardiac Enzymes: No results for input(s): "CKTOTAL", "CKMB", "CKMBINDEX", "TROPONINI" in the last 168 hours. BNP (last 3 results) No results for input(s): "PROBNP" in the last 8760 hours. HbA1C: No results for input(s): "HGBA1C" in the last 72 hours. CBG: Recent Labs  Lab 12/15/22 0636  GLUCAP 82    Lipid Profile: No  results for input(s): "CHOL", "HDL", "LDLCALC", "TRIG", "CHOLHDL", "LDLDIRECT" in the last 72 hours. Thyroid Function Tests: No results for input(s): "TSH", "T4TOTAL", "FREET4", "T3FREE", "THYROIDAB" in the last 72 hours. Anemia Panel: No results for input(s): "VITAMINB12", "FOLATE", "FERRITIN", "TIBC", "IRON", "RETICCTPCT" in the last 72 hours. Sepsis Labs: No results for input(s): "PROCALCITON", "LATICACIDVEN" in the last 168 hours.  No results found for this or any previous visit (from the past 240 hour(s)).    Radiology Studies: CT CHEST WO CONTRAST  Result Date: 12/14/2022 CLINICAL DATA:  Ascending colonic mass for staging EXAM: CT CHEST WITHOUT CONTRAST TECHNIQUE: Multidetector CT imaging of the chest was performed following the standard protocol without IV contrast. RADIATION DOSE REDUCTION: This exam was performed according to the departmental  dose-optimization program which includes automated exposure control, adjustment of the mA and/or kV according to patient size and/or use of iterative reconstruction technique. COMPARISON:  CTA chest dated 12/20/2018 FINDINGS: Cardiovascular: Normal heart size. No significant pericardial fluid/thickening. Coronary artery calcifications and aortic atherosclerosis. Great vessels are normal in course and caliber. Mediastinum/Nodes: Imaged thyroid gland without nodules meeting criteria for imaging follow-up by size. Normal esophagus. No pathologically enlarged axillary, supraclavicular, mediastinal, or hilar lymph nodes. Lungs/Pleura: Focus of adherent secretions along the right lateral distal trachea. The central airways are patent. Diffuse bronchial wall thickening and bilateral lower lobe mild bronchiectasis. No focal consolidation. Bilateral upper lobe calcified granulomata. Bilateral lower lobe subpleural scarring. No pneumothorax. Trace bilateral pleural effusions. Upper abdomen: Increased small volume ascites. Distended gallbladder. Musculoskeletal: No  acute or abnormal lytic or blastic osseous lesions. IMPRESSION: 1. No evidence of metastatic disease in the chest. 2. Trace bilateral pleural effusions. 3. Increased small volume ascites. 4. Aortic Atherosclerosis (ICD10-I70.0). Coronary artery calcifications. Assessment for potential risk factor modification, dietary therapy or pharmacologic therapy may be warranted, if clinically indicated. Electronically Signed   By: Darrin Nipper M.D.   On: 12/14/2022 09:26      Scheduled Meds:  [id number] Hold] sodium chloride   Intravenous Once   Chlorhexidine Gluconate Cloth  6 each Topical Once   [MAR Hold] feeding supplement  237 mL Oral TID BM   [MAR Hold] metoprolol succinate  12.5 mg Oral Daily   [MAR Hold] sodium chloride flush  3 mL Intravenous Q12H   Continuous Infusions:  cefoTEtan (CEFOTAN) IV     [MAR Hold] promethazine (PHENERGAN) injection (IM or IVPB) 12.5 mg (12/14/22 1532)   sodium chloride 0.9 % with cefoTEtan (CEFOTAN) ADS Med        LOS: 6 days   Time spent: 30 minutes   Dessa Phi, DO [facility] 12/15/2022, 10:21 AM   Available via Epic secure chat 7am-7pm After these hours, please refer to coverage provider listed on [email or web address]

## 2022-12-15 NOTE — Progress Notes (Signed)
Day of Surgery   Subjective/Chief Complaint: No acute changes. Reports continued abdominal pain.   Objective: Vital signs in last 24 hours: Temp:  [97.5 F (36.4 C)-98.4 F (36.9 C)] 97.5 F (36.4 C) (02/27 0700) Pulse Rate:  [63-78] 69 (02/27 0610) Resp:  [16] 16 (02/27 0700) BP: (120-141)/(55-67) 141/57 (02/27 0700) SpO2:  [96 %-100 %] 100 % (02/27 0700) Weight:  [89.4 kg] 89.4 kg (02/27 0807) Last BM Date : 12/10/22  Intake/Output from previous day: 02/26 0701 - 02/27 0700 In: 290 [P.O.:240; IV Piggyback:50] Out: 200 [Urine:200] Intake/Output this shift: No intake/output data recorded.  General: resting comfortably, NAD Neuro: alert and oriented, no focal deficits Resp: normal work of breathing on room air Abdomen: soft, nondistended, mild RLQ tenderness to palpation. Well-healed lower midline surgical scar. Extremities: warm and well-perfused   Lab Results:  Recent Labs    12/14/22 0404  WBC 6.5  HGB 9.8*  HCT 32.6*  PLT 227   BMET Recent Labs    12/14/22 0404  NA 133*  K 4.0  CL 103  CO2 22  GLUCOSE 88  BUN 10  CREATININE 1.05  CALCIUM 8.0*   PT/INR No results for input(s): "LABPROT", "INR" in the last 72 hours. ABG No results for input(s): "PHART", "HCO3" in the last 72 hours.  Invalid input(s): "PCO2", "PO2"  Studies/Results: CT CHEST WO CONTRAST  Result Date: 12/14/2022 CLINICAL DATA:  Ascending colonic mass for staging EXAM: CT CHEST WITHOUT CONTRAST TECHNIQUE: Multidetector CT imaging of the chest was performed following the standard protocol without IV contrast. RADIATION DOSE REDUCTION: This exam was performed according to the departmental dose-optimization program which includes automated exposure control, adjustment of the mA and/or kV according to patient size and/or use of iterative reconstruction technique. COMPARISON:  CTA chest dated 12/20/2018 FINDINGS: Cardiovascular: Normal heart size. No significant pericardial fluid/thickening.  Coronary artery calcifications and aortic atherosclerosis. Great vessels are normal in course and caliber. Mediastinum/Nodes: Imaged thyroid gland without nodules meeting criteria for imaging follow-up by size. Normal esophagus. No pathologically enlarged axillary, supraclavicular, mediastinal, or hilar lymph nodes. Lungs/Pleura: Focus of adherent secretions along the right lateral distal trachea. The central airways are patent. Diffuse bronchial wall thickening and bilateral lower lobe mild bronchiectasis. No focal consolidation. Bilateral upper lobe calcified granulomata. Bilateral lower lobe subpleural scarring. No pneumothorax. Trace bilateral pleural effusions. Upper abdomen: Increased small volume ascites. Distended gallbladder. Musculoskeletal: No acute or abnormal lytic or blastic osseous lesions. IMPRESSION: 1. No evidence of metastatic disease in the chest. 2. Trace bilateral pleural effusions. 3. Increased small volume ascites. 4. Aortic Atherosclerosis (ICD10-I70.0). Coronary artery calcifications. Assessment for potential risk factor modification, dietary therapy or pharmacologic therapy may be warranted, if clinically indicated. Electronically Signed   By: Darrin Nipper M.D.   On: 12/14/2022 09:26    Anti-infectives: Anti-infectives (From admission, onward)    Start     Dose/Rate Route Frequency Ordered Stop   12/15/22 0915  cefoTEtan (CEFOTAN) 2 g in sodium chloride 0.9 % 100 mL IVPB        2 g 200 mL/hr over 30 Minutes Intravenous On call to O.R. 12/15/22 0906 12/16/22 0559   12/15/22 0909  sodium chloride 0.9 % with cefoTEtan (CEFOTAN) ADS Med       Note to Pharmacy: [name] B: cabinet override      12/15/22 0909 12/15/22 2114       Assessment/Plan: s/p Procedure(s): LAPAROSCOPIC HAND ASSISTED RIGHT HEMI COLECTOMY, POSSIBLE OPEN (N/A)   SBO, recurrent. Right colon  mass. Patient with recent admission for diverticulitis in Jan 2024. During that admission he underwent IR drain  placement. There was concern that the drain migrated through SB lumen and the drain was removed 1/22. He was just admitted last week for SBO that was felt likely 2/2 to above (also hx of prior prostatectomy so possible of intra-abdominal adhesions). He improved and was able to be discahrged on 2/16. Returned 2/21 with likely partial sbo.  - SBO resolved. - Colonoscopy with large non-obstructing malignant appearing mass in the ascending colon. Pathology confirmed moderately differentiated adenocarcinoma. Descending colon polyp was a benign tubular adenoma with no dysplasia. - Staging workup negative for metastatic disease. - Proceed to OR today for laparoscopic-assisted possible open right hemicolectomy. Patient is NPO for OR, periop antibiotics ordered.    LOS: 6 days    Dwan Bolt 12/15/2022

## 2022-12-15 NOTE — Transfer of Care (Signed)
Immediate Anesthesia Transfer of Care Note  Patient: Ian Estes  Procedure(s) Performed: LAPAROSCOPIC HAND ASSISTED RIGHT HEMI COLECTOMY, POSSIBLE OPEN  Patient Location: PACU  Anesthesia Type:General  Level of Consciousness: awake and patient cooperative  Airway & Oxygen Therapy: Patient Spontanous Breathing and Patient connected to face mask oxygen  Post-op Assessment: Report given to RN and Post -op Vital signs reviewed and stable  Post vital signs: Reviewed and stable  Last Vitals:  Vitals Value Taken Time  BP 127/64 12/15/22 1334  Temp    Pulse 73 12/15/22 1338  Resp 10 12/15/22 1338  SpO2 100 % 12/15/22 1338  Vitals shown include unvalidated device data.  Last Pain:  Vitals:   12/15/22 0807  TempSrc:   PainSc: 0-No pain      Patients Stated Pain Goal: 2 ([id number])  Complications: No notable events documented.

## 2022-12-16 ENCOUNTER — Encounter (HOSPITAL_COMMUNITY): Payer: Self-pay | Admitting: Surgery

## 2022-12-16 DIAGNOSIS — K6389 Other specified diseases of intestine: Secondary | ICD-10-CM

## 2022-12-16 DIAGNOSIS — N179 Acute kidney failure, unspecified: Secondary | ICD-10-CM | POA: Diagnosis not present

## 2022-12-16 DIAGNOSIS — K56609 Unspecified intestinal obstruction, unspecified as to partial versus complete obstruction: Secondary | ICD-10-CM | POA: Diagnosis not present

## 2022-12-16 LAB — BASIC METABOLIC PANEL
Anion gap: 13 (ref 5–15)
BUN: 27 mg/dL — ABNORMAL HIGH (ref 8–23)
CO2: 18 mmol/L — ABNORMAL LOW (ref 22–32)
Calcium: 7.8 mg/dL — ABNORMAL LOW (ref 8.9–10.3)
Chloride: 101 mmol/L (ref 98–111)
Creatinine, Ser: 2.23 mg/dL — ABNORMAL HIGH (ref 0.61–1.24)
GFR, Estimated: 29 mL/min — ABNORMAL LOW (ref >60)
Glucose, Bld: 122 mg/dL — ABNORMAL HIGH (ref 70–99)
Potassium: 4.8 mmol/L (ref 3.5–5.1)
Sodium: 132 mmol/L — ABNORMAL LOW (ref 135–145)

## 2022-12-16 LAB — CBC
HCT: 40 % (ref 39.0–52.0)
Hemoglobin: 11.7 g/dL — ABNORMAL LOW (ref 13.0–17.0)
MCH: 26.4 pg (ref 26.0–34.0)
MCHC: 29.3 g/dL — ABNORMAL LOW (ref 30.0–36.0)
MCV: 90.1 fL (ref 80.0–100.0)
Platelets: 324 10*3/uL (ref 150–400)
RBC: 4.44 MIL/uL (ref 4.22–5.81)
RDW: 16.3 % — ABNORMAL HIGH (ref 11.5–15.5)
WBC: 7.8 10*3/uL (ref 4.0–10.5)
nRBC: 0 % (ref 0.0–0.2)

## 2022-12-16 MED ORDER — SODIUM CHLORIDE 0.9 % IV BOLUS
1000.0000 mL | Freq: Once | INTRAVENOUS | Status: AC
  Administered 2022-12-16: 1000 mL via INTRAVENOUS

## 2022-12-16 MED ORDER — CHLORHEXIDINE GLUCONATE CLOTH 2 % EX PADS
6.0000 | MEDICATED_PAD | Freq: Every day | CUTANEOUS | Status: DC
  Administered 2022-12-16 – 2022-12-17 (×2): 6 via TOPICAL

## 2022-12-16 MED ORDER — SODIUM CHLORIDE 0.9 % IV BOLUS
250.0000 mL | Freq: Once | INTRAVENOUS | Status: AC
  Administered 2022-12-16: 250 mL via INTRAVENOUS

## 2022-12-16 MED ORDER — SODIUM CHLORIDE 0.9 % IV SOLN: INTRAVENOUS | Status: DC

## 2022-12-16 NOTE — Progress Notes (Addendum)
PROGRESS NOTE    Ian Estes  [id number] DOB: May 23, 1941 DOA: 12/09/2022 PCP: Aletha Halim., PA-C     Brief Narrative:  Ian Estes is a 82 y.o. male with medical history significant of CKD stage III, A-fib on Eliquis, hypertension, hyperlipidemia who presents with abdominal pain and nausea. He was admitted from 1/7 to 1/12 secondary to acute sigmoid diverticulitis with contained perforation/abscess. He was also admitted again 1/21 to 1/25 secondary to recurrent diverticulitis. Admitted again 2/14 to 2/16 secondary to small bowel obstruction.  At that time, his small bowel obstruction resolved with conservative measures.  He was recommended for outpatient colonoscopy. Pt returned with recurrent abdominal pain, found to have recurrent SBO. General surgery was consulted. Due to hematochezia, GI consulted.  Patient underwent colonoscopy by Dr. Benson Norway 2/23.  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 Pt underwent open R hemicolectomy on 12/15/22 with lysis of adhesion.   New events last 24 hours / Subjective: Patient with controlled post op pain. Noted NGT with bilious drainage   Assessment & Plan:  Principal Problem:   SBO (small bowel obstruction) (HCC) Active Problems:   Chronic atrial fibrillation (HCC)   Chronic kidney disease, stage 3a (HCC)   Hypertension   Colonic mass   Hyperkalemia   Recurrent small bowel obstruction, recent history of diverticulitis R colon adenocarcinoma S/p laparoscopic converted to open right hemicolectomy and lysis of adhesions on 12/15/2022 Status post colonoscopy 2/23, by Dr. Benson Norway Pathology revealed invasive moderately differentiated adenocarcinoma CEA 4.9 General surgery on board, status post right hemicolectomy-plan to continue n.p.o. status, continue NG decompression.  Surgical pathology pending Discussed with Dr. Chryl Heck, oncology,  plan to await hemicolectomy pathology and have patient establish in office outpatient. Outpatient referral to hematology/oncology placed  AKI Cr bumped to 2.23 Started on IV fluids  Hyponatremia Continue IV fluids Daily BMP   Chronic A-fib Resume metoprolol, hold Eliquis (last dose 2/21 AM)     DVT prophylaxis:  enoxaparin (LOVENOX) injection 40 mg Start: 12/16/22 1200 SCDs Start: 12/09/22 1744  Code Status: DNR Family Communication: None at bedside  Disposition Plan:  Status is: Inpatient Remains inpatient appropriate because: Level of care  Consultants:  General surgery GI  Antimicrobials:  Anti-infectives (From admission, onward)    Start     Dose/Rate Route Frequency Ordered Stop   12/15/22 0915  cefoTEtan (CEFOTAN) 2 g in sodium chloride 0.9 % 100 mL IVPB        2 g 200 mL/hr over 30 Minutes Intravenous On call to O.R. 12/15/22 0906 12/15/22 1123   12/15/22 0909  sodium chloride 0.9 % with cefoTEtan (CEFOTAN) ADS Med       Note to Pharmacy: [name] B: cabinet override      12/15/22 0909 12/15/22 1122        Objective: Vitals:   12/16/22 0019 12/16/22 0238 12/16/22 1225 12/16/22 1500  BP: 125/64 122/67 106/63 (!) 113/53  Pulse:  84 77 76  Resp:  15    Temp:  97.7 F (36.5 C) 97.7 F (36.5 C) 97.8 F (36.6 C)  TempSrc:  Oral Oral Oral  SpO2:  98% 97% 98%  Weight:      Height:        Intake/Output Summary (Last 24 hours) at 12/16/2022 1748 Last data filed at 12/16/2022 1736 Gross per 24 hour  Intake 105.57 ml  Output 300 ml  Net -194.43 ml   [employer]  12/09/22 1415 12/15/22 0807  Weight: 89.4 kg 89.4 kg    Examination:  General: NAD, NGT noted Cardiovascular: S1, S2 present Respiratory: CTAB Abdomen: Soft, tender, nondistended, no bowel sounds, dressing intact, with minimal drainage  Musculoskeletal: No bilateral pedal edema noted Skin: Normal Psychiatry: Normal mood    Data Reviewed: I have personally reviewed following  labs and imaging studies  CBC: Recent Labs  Lab 12/10/22 0523 12/11/22 0450 12/12/22 0923 12/14/22 0404 12/16/22 0541  WBC 6.8 7.0 8.4 6.5 7.8  HGB 9.9* 9.7* 9.8* 9.8* 11.7*  HCT 31.7* 31.3* 32.3* 32.6* 40.0  MCV 86.1 86.0 86.6 86.0 90.1  PLT 221 238 203 227 0000000   Basic Metabolic Panel: Recent Labs  Lab 12/10/22 0523 12/11/22 0450 12/12/22 0546 12/14/22 0404 12/16/22 0541  NA 135 134* 134* 133* 132*  K 5.1 4.2 4.5 4.0 4.8  CL 103 103 104 103 101  CO2 '22 23 22 22 '$ 18*  GLUCOSE 91 100* 125* 88 122*  BUN '11 9 8 10 '$ 27*  CREATININE 1.23 1.10 1.09 1.05 2.23*  CALCIUM 8.6* 8.3* 8.3* 8.0* 7.8*   GFR: Estimated Creatinine Clearance: 28 mL/min (A) (by C-G formula based on SCr of 2.23 mg/dL (H)). Liver Function Tests: Recent Labs  Lab 12/10/22 0523  AST 29  ALT 13  ALKPHOS 124  BILITOT 0.8  PROT 5.8*  ALBUMIN 2.6*   No results for input(s): "LIPASE", "AMYLASE" in the last 168 hours. No results for input(s): "AMMONIA" in the last 168 hours. Coagulation Profile: No results for input(s): "INR", "PROTIME" in the last 168 hours. Cardiac Enzymes: No results for input(s): "CKTOTAL", "CKMB", "CKMBINDEX", "TROPONINI" in the last 168 hours. BNP (last 3 results) No results for input(s): "PROBNP" in the last 8760 hours. HbA1C: No results for input(s): "HGBA1C" in the last 72 hours. CBG: Recent Labs  Lab 12/15/22 0636 12/15/22 1348  GLUCAP 82 105*   Lipid Profile: No results for input(s): "CHOL", "HDL", "LDLCALC", "TRIG", "CHOLHDL", "LDLDIRECT" in the last 72 hours. Thyroid Function Tests: No results for input(s): "TSH", "T4TOTAL", "FREET4", "T3FREE", "THYROIDAB" in the last 72 hours. Anemia Panel: No results for input(s): "VITAMINB12", "FOLATE", "FERRITIN", "TIBC", "IRON", "RETICCTPCT" in the last 72 hours. Sepsis Labs: No results for input(s): "PROCALCITON", "LATICACIDVEN" in the last 168 hours.  No results found for this or any previous visit (from the past 240  hour(s)).    Radiology Studies: No results found.    Scheduled Meds:  Chlorhexidine Gluconate Cloth  6 each Topical Daily   enoxaparin (LOVENOX) injection  40 mg Subcutaneous Q24H   lidocaine  1 patch Transdermal Q24H   metoprolol tartrate  5 mg Intravenous Q6H   sodium chloride flush  3 mL Intravenous Q12H   Continuous Infusions:  sodium chloride 100 mL/hr at 12/16/22 [id number]   methocarbamol (ROBAXIN) IV     promethazine (PHENERGAN) injection (IM or IVPB) 12.5 mg (12/14/22 1532)      LOS: 7 days     Alma Friendly, MD [facility] 12/16/2022, 5:48 PM   Available via Epic secure chat 7am-7pm After these hours, please refer to coverage provider listed on [email or web address]

## 2022-12-16 NOTE — Anesthesia Postprocedure Evaluation (Signed)
Anesthesia Post Note  Patient: Ian Estes  Procedure(s) Performed: LAPAROSCOPIC HAND ASSISTED RIGHT HEMI COLECTOMY, POSSIBLE OPEN     Patient location during evaluation: PACU Anesthesia Type: General Level of consciousness: awake and alert Pain management: pain level controlled Vital Signs Assessment: post-procedure vital signs reviewed and stable Respiratory status: spontaneous breathing, nonlabored ventilation, respiratory function stable and patient connected to nasal cannula oxygen Cardiovascular status: blood pressure returned to baseline and stable Postop Assessment: no apparent nausea or vomiting Anesthetic complications: no  No notable events documented.  Last Vitals:  Vitals:   12/16/22 0019 12/16/22 0238  BP: 125/64 122/67  Pulse:  84  Resp:  15  Temp:  36.5 C  SpO2:  98%    Last Pain:  Vitals:   12/16/22 0630  TempSrc:   PainSc: 8                  Cheril Slattery L Sameera Betton

## 2022-12-16 NOTE — Progress Notes (Signed)
1 Day Post-Op   Subjective/Chief Complaint: Creatinine up to 2.2 this morning, BUN elevated. No UOP recorded overnight. Pain controlled, no bowel function.   Objective: Vital signs in last 24 hours: Temp:  [97.4 F (36.3 C)-98.1 F (36.7 C)] 97.7 F (36.5 C) (02/28 0238) Pulse Rate:  [67-94] 84 (02/28 0238) Resp:  [10-18] 15 (02/28 0238) BP: (100-133)/(63-71) 122/67 (02/28 0238) SpO2:  [98 %-100 %] 98 % (02/28 0238) Last BM Date : 12/10/22  Intake/Output from previous day: 02/27 0701 - 02/28 0700 In: 1704 [I.V.:1403; IV Piggyback:301] Out: 725 [Urine:600; Emesis/NG output:100; Blood:25] Intake/Output this shift: No intake/output data recorded.  General: resting comfortably, NAD Neuro: alert and oriented, no focal deficits HEENT: NG in place with bilious drainage Resp: normal work of breathing on room air Abdomen: soft, nondistended, incisions clean and dry, honeycomb in place over midline incision. Extremities: warm and well-perfused GU: foley draining dark yellow urine   Lab Results:  Recent Labs    12/14/22 0404 12/16/22 0541  WBC 6.5 7.8  HGB 9.8* 11.7*  HCT 32.6* 40.0  PLT 227 324   BMET Recent Labs    12/14/22 0404 12/16/22 0541  NA 133* 132*  K 4.0 4.8  CL 103 101  CO2 22 18*  GLUCOSE 88 122*  BUN 10 27*  CREATININE 1.05 2.23*  CALCIUM 8.0* 7.8*   PT/INR No results for input(s): "LABPROT", "INR" in the last 72 hours. ABG No results for input(s): "PHART", "HCO3" in the last 72 hours.  Invalid input(s): "PCO2", "PO2"  Studies/Results: No results found.  Anti-infectives: Anti-infectives (From admission, onward)    Start     Dose/Rate Route Frequency Ordered Stop   12/15/22 0915  cefoTEtan (CEFOTAN) 2 g in sodium chloride 0.9 % 100 mL IVPB        2 g 200 mL/hr over 30 Minutes Intravenous On call to O.R. 12/15/22 0906 12/15/22 1123   12/15/22 0909  sodium chloride 0.9 % with cefoTEtan (CEFOTAN) ADS Med       Note to Pharmacy: [name]  B: cabinet override      12/15/22 0909 12/15/22 1122       Assessment/Plan: s/p Procedure(s): LAPAROSCOPIC HAND ASSISTED RIGHT HEMI COLECTOMY, POSSIBLE OPEN (N/A)   SBO, recurrent, secondary to adhesive disease Right colon adenocarcinoma POD1 s/p laparoscopic converted to open right hemicolectomy and lysis of adhesions. - Remain NPO today, continue NG decompression - AKI likely secondary to volume depletion, no fluids running overnight. 1L crystalloid bolus and maintenance fluids ordered this morning. Continue foley, discussed UOP charting with nursing. - Surgical pathology pending - Mobilize today, PT ordered - VTE: lovenox, SCDs    LOS: 7 days    Dwan Bolt 12/16/2022

## 2022-12-16 NOTE — Evaluation (Signed)
Physical Therapy Evaluation Patient Details Name: Ian Estes MRN: [id number] DOB: 25-Dec-1940 Today's Date: 12/16/2022  History of Present Illness  82 yo male admitted with colon mass. S/P R hemicolectomy 12/15/22, CKD, Afib  Clinical Impression  On eval, pt required Min assist for mobility. He walked ~15 feet with a RW. Pt presents with general weakness, decreased activity tolerance, and impaired gait and balance. Moderate pain with activity. He fatigued very easily with activity on today. He is generally frustrated with his recovery/current status. Will plan to follow and progress activity as tolerated. Recommend HHPT f/u and RW for ambulation safety at this time.        Recommendations for follow up therapy are one component of a multi-disciplinary discharge planning process, led by the attending physician.  Recommendations may be updated based on patient status, additional functional criteria and insurance authorization.  Follow Up Recommendations Home health PT      Assistance Recommended at Discharge Frequent or constant Supervision/Assistance  Patient can return home with the following  A little help with walking and/or transfers;A little help with bathing/dressing/bathroom;Assistance with cooking/housework;Assist for transportation;Help with stairs or ramp for entrance    Equipment Recommendations Rolling walker (2 wheels)  Recommendations for Other Services  OT consult    Functional Status Assessment Patient has had a recent decline in their functional status and demonstrates the ability to make significant improvements in function in a reasonable and predictable amount of time.     Precautions / Restrictions Precautions Precautions: Fall Precaution Comments: NG tube; abd surgery Restrictions Weight Bearing Restrictions: No      Mobility  Bed Mobility Overal bed mobility: Needs Assistance Bed Mobility: Supine to Sit, Sit to Supine     Supine to sit: HOB elevated,  Min assist Sit to supine: Min assist, HOB elevated   General bed mobility comments: Pt requested to perform on his own with HOB maximally elevated. Increased time. 1 HHA given for trunk to upright and for LEs back onto bed.    Transfers Overall transfer level: Needs assistance Equipment used: Rolling walker (2 wheels) Transfers: Sit to/from Stand Sit to Stand: From elevated surface, Min assist           General transfer comment: Assist to rise, steady, control descent. Cues for safety, technique, hand placement. Increased time.    Ambulation/Gait Ambulation/Gait assistance: Min assist Gait Distance (Feet): 15 Feet Assistive device: Rolling walker (2 wheels) Gait Pattern/deviations: Step-through pattern, Decreased stride length, Trunk flexed       General Gait Details: Cues for safety, posture, RW proximity. Pt is weak and fatigues quickly/easily. He only made it just outside of his room door before having to turn back. Sat abruptly before safely positioned in front of bed.  Stairs            Wheelchair Mobility    Modified Rankin (Stroke Patients Only)       Balance Overall balance assessment: Needs assistance         Standing balance support: Bilateral upper extremity supported, During functional activity, Reliant on assistive device for balance Standing balance-Leahy Scale: Poor                               Pertinent Vitals/Pain Pain Assessment Pain Assessment: 0-10 Pain Score: 8  Pain Location: abdomen Pain Descriptors / Indicators: Operative site guarding, Grimacing, Moaning Pain Intervention(s): Limited activity within patient's tolerance, Monitored during session, Repositioned, Patient requesting pain meds-RN  notified, Ice applied    Home Living Family/patient expects to be discharged to:: Private residence Living Arrangements: Children Available Help at Discharge: Family;Available PRN/intermittently Type of Home: House Home  Access: Stairs to enter Entrance Stairs-Rails: None Entrance Stairs-Number of Steps: 1-2   Home Layout: One level;Laundry or work area in [employer]: None Additional Comments: lives with son currently while building of his house being completed; denies falls    Prior Function Prior Level of Function : Independent/Modified Independent;Driving                     Journalist, newspaper        Extremity/Trunk Assessment   Upper Extremity Assessment Upper Extremity Assessment: Defer to OT evaluation    Lower Extremity Assessment Lower Extremity Assessment: Generalized weakness    Cervical / Trunk Assessment Cervical / Trunk Assessment: Normal  Communication   Communication: No difficulties  Cognition Arousal/Alertness: Awake/alert Behavior During Therapy: WFL for tasks assessed/performed Overall Cognitive Status: Within Functional Limits for tasks assessed                                          General Comments      Exercises     Assessment/Plan    PT Assessment Patient needs continued PT services  PT Problem List Decreased strength;Decreased balance;Decreased activity tolerance;Decreased range of motion;Decreased mobility;Pain;Decreased knowledge of use of DME       PT Treatment Interventions DME instruction;Gait training;Functional mobility training;Therapeutic activities;Balance training;Patient/family education;Therapeutic exercise    PT Goals (Current goals can be found in the Care Plan section)  Acute Rehab PT Goals Patient Stated Goal: to get NG tube out of nose and walk PT Goal Formulation: With patient Time For Goal Achievement: 12/30/22 Potential to Achieve Goals: Good    Frequency Min 3X/week     Co-evaluation               AM-PAC PT "6 Clicks" Mobility  Outcome Measure Help needed turning from your back to your side while in a flat bed without using bedrails?: A Little Help needed moving from lying on your  back to sitting on the side of a flat bed without using bedrails?: A Little Help needed moving to and from a bed to a chair (including a wheelchair)?: A Little Help needed standing up from a chair using your arms (e.g., wheelchair or bedside chair)?: A Little Help needed to walk in hospital room?: A Lot Help needed climbing 3-5 steps with a railing? : A Lot 6 Click Score: 16    End of Session   Activity Tolerance: Patient limited by pain;Patient limited by fatigue Patient left: in bed;with call bell/phone within reach;with bed alarm set Nurse Communication: Patient requests pain meds PT Visit Diagnosis: Muscle weakness (generalized) (M62.81);Pain;Difficulty in walking, not elsewhere classified (R26.2) Pain - part of body:  (abdomen)    Time: [id number] PT Time Calculation (min) (ACUTE ONLY): 24 min   Charges:   PT Evaluation $PT Eval Low Complexity: 1 Low PT Treatments $Gait Training: 8-22 mins           Doreatha Massed, PT Acute Rehabilitation  Office: [phone]

## 2022-12-17 ENCOUNTER — Other Ambulatory Visit: Payer: Self-pay

## 2022-12-17 DIAGNOSIS — K56609 Unspecified intestinal obstruction, unspecified as to partial versus complete obstruction: Secondary | ICD-10-CM | POA: Diagnosis not present

## 2022-12-17 DIAGNOSIS — N179 Acute kidney failure, unspecified: Secondary | ICD-10-CM | POA: Diagnosis not present

## 2022-12-17 DIAGNOSIS — K6389 Other specified diseases of intestine: Secondary | ICD-10-CM | POA: Diagnosis not present

## 2022-12-17 LAB — CBC WITH DIFFERENTIAL/PLATELET
Abs Immature Granulocytes: 0.04 K/uL (ref 0.00–0.07)
Basophils Absolute: 0 K/uL (ref 0.0–0.1)
Basophils Relative: 0 %
Eosinophils Absolute: 0 K/uL (ref 0.0–0.5)
Eosinophils Relative: 0 %
HCT: 34.2 % — ABNORMAL LOW (ref 39.0–52.0)
Hemoglobin: 10.4 g/dL — ABNORMAL LOW (ref 13.0–17.0)
Immature Granulocytes: 1 %
Lymphocytes Relative: 12 %
Lymphs Abs: 1.1 K/uL (ref 0.7–4.0)
MCH: 26 pg (ref 26.0–34.0)
MCHC: 30.4 g/dL (ref 30.0–36.0)
MCV: 85.5 fL (ref 80.0–100.0)
Monocytes Absolute: 1 K/uL (ref 0.1–1.0)
Monocytes Relative: 12 %
Neutro Abs: 6.6 K/uL (ref 1.7–7.7)
Neutrophils Relative %: 75 %
Platelets: 316 K/uL (ref 150–400)
RBC: 4 MIL/uL — ABNORMAL LOW (ref 4.22–5.81)
RDW: 16.3 % — ABNORMAL HIGH (ref 11.5–15.5)
WBC: 8.8 K/uL (ref 4.0–10.5)
nRBC: 0 % (ref 0.0–0.2)

## 2022-12-17 LAB — BASIC METABOLIC PANEL WITH GFR
Anion gap: 9 (ref 5–15)
BUN: 36 mg/dL — ABNORMAL HIGH (ref 8–23)
CO2: 21 mmol/L — ABNORMAL LOW (ref 22–32)
Calcium: 7.9 mg/dL — ABNORMAL LOW (ref 8.9–10.3)
Chloride: 106 mmol/L (ref 98–111)
Creatinine, Ser: 2.06 mg/dL — ABNORMAL HIGH (ref 0.61–1.24)
GFR, Estimated: 32 mL/min — ABNORMAL LOW
Glucose, Bld: 101 mg/dL — ABNORMAL HIGH (ref 70–99)
Potassium: 4.1 mmol/L (ref 3.5–5.1)
Sodium: 136 mmol/L (ref 135–145)

## 2022-12-17 MED ORDER — METOPROLOL TARTRATE 5 MG/5ML IV SOLN: 5.0000 mg | Freq: Two times a day (BID) | INTRAVENOUS | Status: DC

## 2022-12-17 MED ORDER — METOPROLOL SUCCINATE ER 25 MG PO TB24
12.5000 mg | ORAL_TABLET | Freq: Every day | ORAL | Status: DC
  Administered 2022-12-18 – 2022-12-24 (×7): 12.5 mg via ORAL
  Filled 2022-12-17 (×7): qty 1

## 2022-12-17 MED ORDER — METOPROLOL TARTRATE 5 MG/5ML IV SOLN: 5.0000 mg | Freq: Two times a day (BID) | INTRAVENOUS | Status: DC | PRN

## 2022-12-17 MED ORDER — ACETAMINOPHEN 325 MG PO TABS
650.0000 mg | ORAL_TABLET | Freq: Four times a day (QID) | ORAL | Status: DC
  Administered 2022-12-17 – 2022-12-24 (×26): 650 mg via ORAL
  Filled 2022-12-17 (×26): qty 2

## 2022-12-17 MED ORDER — OXYCODONE HCL 5 MG PO TABS
5.0000 mg | ORAL_TABLET | ORAL | Status: DC | PRN
  Administered 2022-12-18 – 2022-12-21 (×10): 5 mg via ORAL
  Filled 2022-12-17 (×10): qty 1

## 2022-12-17 MED ORDER — HYDROMORPHONE HCL 1 MG/ML IJ SOLN
0.5000 mg | INTRAMUSCULAR | Status: DC | PRN
  Administered 2022-12-19: 0.5 mg via INTRAVENOUS
  Filled 2022-12-17: qty 0.5

## 2022-12-17 NOTE — Progress Notes (Signed)
PROGRESS NOTE    Ian Estes  [id number] DOB: 07-18-1941 DOA: 12/09/2022 PCP: Ian Estes., PA-C     Brief Narrative:  Ian Estes is a 82 y.o. male with medical history significant of CKD stage III, A-fib on Eliquis, hypertension, hyperlipidemia who presents with abdominal pain and nausea. He was admitted from 1/7 to 1/12 secondary to acute sigmoid diverticulitis with contained perforation/abscess. He was also admitted again 1/21 to 1/25 secondary to recurrent diverticulitis. Admitted again 2/14 to 2/16 secondary to small bowel obstruction.  At that time, his small bowel obstruction resolved with conservative measures.  He was recommended for outpatient colonoscopy. Pt returned with recurrent abdominal pain, found to have recurrent SBO. General surgery was consulted. Due to hematochezia, GI consulted.  Patient underwent colonoscopy by Dr. Benson Estes 2/23.  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 Pt underwent open R hemicolectomy on 12/15/22 with lysis of adhesion.   New events last 24 hours / Subjective: Patient reports passing gas, denies any worsening abdominal pain. NGT removed.    Assessment & Plan:  Principal Problem:   SBO (small bowel obstruction) (HCC) Active Problems:   Chronic atrial fibrillation (HCC)   Chronic kidney disease, stage 3a (HCC)   Hypertension   Colonic mass   Hyperkalemia   Recurrent small bowel obstruction, recent history of diverticulitis R colon adenocarcinoma S/p laparoscopic converted to open right hemicolectomy and lysis of adhesions on 12/15/2022 Status post colonoscopy 2/23, by Dr. 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 Chryl Estes, oncology, rec Outpatient  referral to hematology/oncology placed  AKI Cr bumped to 2.23, trending down slowly  Continue on IV fluids  Hyponatremia Continue IV fluids Daily BMP   Chronic A-fib Resume metoprolol, hold Eliquis (last dose 2/21 AM)     DVT prophylaxis:  enoxaparin (LOVENOX) injection 40 mg Start: 12/16/22 1200 SCDs Start: 12/09/22 1744  Code Status: DNR Family Communication: None at bedside  Disposition Plan:  Status is: Inpatient Remains inpatient appropriate because: Level of care  Consultants:  General surgery GI  Antimicrobials:  Anti-infectives (From admission, onward)    Start     Dose/Rate Route Frequency Ordered Stop   12/15/22 0915  cefoTEtan (CEFOTAN) 2 g in sodium chloride 0.9 % 100 mL IVPB        2 g 200 mL/hr over 30 Minutes Intravenous On call to O.R. 12/15/22 0906 12/15/22 1123   12/15/22 0909  sodium chloride 0.9 % with cefoTEtan (CEFOTAN) ADS Med       Note to Pharmacy: Ian Estes B: cabinet override      12/15/22 0909 12/15/22 1122        Objective: Vitals:   12/16/22 2038 12/16/22 2351 12/17/22 0438 12/17/22 1401  BP: 123/61 (!) 108/54 (!) 109/58 105/63  Pulse: 80 65 76 72  Resp:    16  Temp: 97.9 F (36.6 C)  97.7 F (36.5 C) 97.7 F (36.5 C)  TempSrc: Oral  Oral Oral  SpO2: 100%  99% 100%  Weight:      Height:        Intake/Output Summary (Last 24 hours) at 12/17/2022 1735 Last data filed at 12/17/2022 0900 Gross per 24 hour  Intake 1109.79 ml  Output 1295 ml  Net -185.21 ml   Filed Weights   12/09/22 1415 12/15/22 0807  Weight: 89.4 kg  89.4 kg    Examination:  General: NAD Cardiovascular: S1, S2 present Respiratory: CTAB Abdomen: Soft, tender, nondistended, no bowel sounds, dressing intact, with minimal drainage  Musculoskeletal: No bilateral pedal edema noted Skin: Normal Psychiatry: Normal mood    Data Reviewed: I have personally reviewed following labs and imaging studies  CBC: Recent Labs  Lab 12/11/22 0450  12/12/22 0923 12/14/22 0404 12/16/22 0541 12/17/22 0759  WBC 7.0 8.4 6.5 7.8 8.8  NEUTROABS  --   --   --   --  6.6  HGB 9.7* 9.8* 9.8* 11.7* 10.4*  HCT 31.3* 32.3* 32.6* 40.0 34.2*  MCV 86.0 86.6 86.0 90.1 85.5  PLT 238 203 227 324 [id number]   Basic Metabolic Panel: Recent Labs  Lab 12/11/22 0450 12/12/22 0546 12/14/22 0404 12/16/22 0541 12/17/22 0759  NA 134* 134* 133* 132* 136  K 4.2 4.5 4.0 4.8 4.1  CL 103 104 103 101 106  CO2 '23 22 22 '$ 18* 21*  GLUCOSE 100* 125* 88 122* 101*  BUN '9 8 10 '$ 27* 36*  CREATININE 1.10 1.09 1.05 2.23* 2.06*  CALCIUM 8.3* 8.3* 8.0* 7.8* 7.9*   GFR: Estimated Creatinine Clearance: 30.3 mL/min (A) (by C-G formula based on SCr of 2.06 mg/dL (H)). Liver Function Tests: No results for input(s): "AST", "ALT", "ALKPHOS", "BILITOT", "PROT", "ALBUMIN" in the last 168 hours.  No results for input(s): "LIPASE", "AMYLASE" in the last 168 hours. No results for input(s): "AMMONIA" in the last 168 hours. Coagulation Profile: No results for input(s): "INR", "PROTIME" in the last 168 hours. Cardiac Enzymes: No results for input(s): "CKTOTAL", "CKMB", "CKMBINDEX", "TROPONINI" in the last 168 hours. BNP (last 3 results) No results for input(s): "PROBNP" in the last 8760 hours. HbA1C: No results for input(s): "HGBA1C" in the last 72 hours. CBG: Recent Labs  Lab 12/15/22 0636 12/15/22 1348  GLUCAP 82 105*   Lipid Profile: No results for input(s): "CHOL", "HDL", "LDLCALC", "TRIG", "CHOLHDL", "LDLDIRECT" in the last 72 hours. Thyroid Function Tests: No results for input(s): "TSH", "T4TOTAL", "FREET4", "T3FREE", "THYROIDAB" in the last 72 hours. Anemia Panel: No results for input(s): "VITAMINB12", "FOLATE", "FERRITIN", "TIBC", "IRON", "RETICCTPCT" in the last 72 hours. Sepsis Labs: No results for input(s): "PROCALCITON", "LATICACIDVEN" in the last 168 hours.  No results found for this or any previous visit (from the past 240 hour(s)).    Radiology  Studies: No results found.    Scheduled Meds:  acetaminophen  650 mg Oral QID   Chlorhexidine Gluconate Cloth  6 each Topical Daily   enoxaparin (LOVENOX) injection  40 mg Subcutaneous Q24H   lidocaine  1 patch Transdermal Q24H   metoprolol tartrate  5 mg Intravenous Q12H   sodium chloride flush  3 mL Intravenous Q12H   Continuous Infusions:  sodium chloride 100 mL/hr at 12/16/22 2222   methocarbamol (ROBAXIN) IV     promethazine (PHENERGAN) injection (IM or IVPB) 12.5 mg (12/14/22 1532)      LOS: 8 days     Alma Friendly, MD [facility] 12/17/2022, 5:35 PM   Available via Epic secure chat 7am-7pm After these hours, please refer to coverage provider listed on [email or web address]

## 2022-12-17 NOTE — Plan of Care (Signed)
  Problem: Clinical Measurements: Goal: Will remain free from infection Outcome: Progressing Goal: Diagnostic test results will improve Outcome: Progressing   Problem: Coping: Goal: Level of anxiety will decrease Outcome: Progressing   

## 2022-12-17 NOTE — TOC Progression Note (Signed)
Transition of Care [facility]) - Progression Note    Patient Details  Name: Ian Estes MRN: [id number] Date of Birth: 06/08/1941  Transition of Care [facility]) CM/SW Contact  Zaneta Lightcap, Juliann Pulse, RN Phone Number: 12/17/2022, 10:51 AM  Clinical Narrative:NGT in place. Active [facility], Patient states he has a rw already.      Expected Discharge Plan: Home/Self Care Barriers to Discharge: Continued Medical Work up  Expected Discharge Plan and Services       Living arrangements for the past 2 months: Single Family Home                                       Social Determinants of Health (SDOH) Interventions SDOH Screenings   Food Insecurity: No Food Insecurity (12/09/2022)  Housing: Low Risk  (12/09/2022)  Transportation Needs: No Transportation Needs (12/09/2022)  Utilities: Not At Risk (12/09/2022)  Tobacco Use: Medium Risk (12/16/2022)    Readmission Risk Interventions    12/03/2022    1:56 PM 10/27/2022    9:24 AM  Readmission Risk Prevention Plan  Post Dischage Appt  Complete  Medication Screening  Complete  Transportation Screening Complete Complete  PCP or Specialist Appt within 5-7 Days Complete   Home Care Screening Complete   Medication Review (RN CM) Complete

## 2022-12-17 NOTE — Progress Notes (Signed)
OT Cancellation Note  Patient Details Name: Noemi Brissey MRN: [id number] DOB: November 09, 1940   Cancelled Treatment:    Reason Eval/Treat Not Completed: Other (comment). Reports he is not getting out of bed today. He did this morning and nursing stepped on his foot and pulled his catheter were his reasons. Will follow.  Demarrio Menges L Monti Jilek 12/17/2022, 1:57 PM

## 2022-12-17 NOTE — Progress Notes (Signed)
2 Days Post-Op   Subjective/Chief Complaint: UOP improved overnight, BMP pending this morning. No nausea or vomiting, minimal NG output. Patient reports he is passing some flatus. Ambulated with PT yesterday.   Objective: Vital signs in last 24 hours: Temp:  [97.7 F (36.5 C)-97.9 F (36.6 C)] 97.7 F (36.5 C) (02/29 0438) Pulse Rate:  [65-80] 76 (02/29 0438) BP: (106-129)/(53-63) 109/58 (02/29 0438) SpO2:  [97 %-100 %] 99 % (02/29 0438) Last BM Date : 12/10/22  Intake/Output from previous day: 02/28 0701 - 02/29 0700 In: 1109.8 [P.O.:120; I.V.:989.8] Out: [id number] [Urine:1045; Emesis/NG output:150] Intake/Output this shift: No intake/output data recorded.  General: resting comfortably, NAD Neuro: alert and oriented, no focal deficits HEENT: NG in place draining gastric contents Resp: normal work of breathing on room air Abdomen: soft, nondistended, incisions clean and dry, honeycomb in place over midline incision. Extremities: warm and well-perfused GU: foley draining light yellow urine   Lab Results:  Recent Labs    12/16/22 0541 12/17/22 0759  WBC 7.8 8.8  HGB 11.7* 10.4*  HCT 40.0 34.2*  PLT 324 316   BMET Recent Labs    12/16/22 0541  NA 132*  K 4.8  CL 101  CO2 18*  GLUCOSE 122*  BUN 27*  CREATININE 2.23*  CALCIUM 7.8*   PT/INR No results for input(s): "LABPROT", "INR" in the last 72 hours. ABG No results for input(s): "PHART", "HCO3" in the last 72 hours.  Invalid input(s): "PCO2", "PO2"  Studies/Results: No results found.     Assessment/Plan: s/p Procedure(s): LAPAROSCOPIC HAND ASSISTED RIGHT HEMI COLECTOMY, POSSIBLE OPEN (N/A)   SBO, recurrent, secondary to adhesive disease Right colon adenocarcinoma POD2 s/p laparoscopic converted to open right hemicolectomy and lysis of adhesions. - Remove NG tube, advance to clear liquid diet - AKI: UOP improved with IV fluid hydration, BMP pending. Plan to remove foley if creatinine improved. -  Multimodal pain control, PO pain meds added today - Surgical pathology pending - Mobilize, PT following - Pulmonary toilet - VTE: lovenox, SCDs. Home eliquis on hold.    LOS: 8 days    Dwan Bolt 12/17/2022

## 2022-12-18 ENCOUNTER — Inpatient Hospital Stay (HOSPITAL_COMMUNITY): Payer: Medicare HMO

## 2022-12-18 DIAGNOSIS — I10 Essential (primary) hypertension: Secondary | ICD-10-CM

## 2022-12-18 DIAGNOSIS — K6389 Other specified diseases of intestine: Secondary | ICD-10-CM | POA: Diagnosis not present

## 2022-12-18 DIAGNOSIS — K56609 Unspecified intestinal obstruction, unspecified as to partial versus complete obstruction: Secondary | ICD-10-CM | POA: Diagnosis not present

## 2022-12-18 LAB — CBC WITH DIFFERENTIAL/PLATELET
Abs Immature Granulocytes: 0.03 10*3/uL (ref 0.00–0.07)
Basophils Absolute: 0 10*3/uL (ref 0.0–0.1)
Basophils Relative: 0 %
Eosinophils Absolute: 0.1 10*3/uL (ref 0.0–0.5)
Eosinophils Relative: 1 %
HCT: 30.2 % — ABNORMAL LOW (ref 39.0–52.0)
Hemoglobin: 9.2 g/dL — ABNORMAL LOW (ref 13.0–17.0)
Immature Granulocytes: 0 %
Lymphocytes Relative: 16 %
Lymphs Abs: 1.1 10*3/uL (ref 0.7–4.0)
MCH: 26.1 pg (ref 26.0–34.0)
MCHC: 30.5 g/dL (ref 30.0–36.0)
MCV: 85.6 fL (ref 80.0–100.0)
Monocytes Absolute: 0.8 10*3/uL (ref 0.1–1.0)
Monocytes Relative: 12 %
Neutro Abs: 4.8 10*3/uL (ref 1.7–7.7)
Neutrophils Relative %: 71 %
Platelets: 238 10*3/uL (ref 150–400)
RBC: 3.53 MIL/uL — ABNORMAL LOW (ref 4.22–5.81)
RDW: 16.5 % — ABNORMAL HIGH (ref 11.5–15.5)
WBC: 6.8 10*3/uL (ref 4.0–10.5)
nRBC: 0 % (ref 0.0–0.2)

## 2022-12-18 LAB — BASIC METABOLIC PANEL
Anion gap: 7 (ref 5–15)
BUN: 28 mg/dL — ABNORMAL HIGH (ref 8–23)
CO2: 20 mmol/L — ABNORMAL LOW (ref 22–32)
Calcium: 7.4 mg/dL — ABNORMAL LOW (ref 8.9–10.3)
Chloride: 108 mmol/L (ref 98–111)
Creatinine, Ser: 1.35 mg/dL — ABNORMAL HIGH (ref 0.61–1.24)
GFR, Estimated: 52 mL/min — ABNORMAL LOW (ref >60)
Glucose, Bld: 95 mg/dL (ref 70–99)
Potassium: 3.5 mmol/L (ref 3.5–5.1)
Sodium: 135 mmol/L (ref 135–145)

## 2022-12-18 LAB — SURGICAL PATHOLOGY

## 2022-12-18 MED ORDER — OXYCODONE HCL 5 MG PO TABS: 5.0000 mg | ORAL_TABLET | Freq: Four times a day (QID) | ORAL | 0 refills | Status: DC | PRN

## 2022-12-18 NOTE — TOC Progression Note (Signed)
Transition of Care [facility]) - Progression Note    Patient Details  Name: Ian Estes MRN: [id number] Date of Birth: 06-16-1941  Transition of Care [facility]) CM/SW Contact  Tabbitha Janvrin, Juliann Pulse, RN Phone Number: 12/18/2022, 12:18 PM  Clinical Narrative: Active [facility].      Expected Discharge Plan: [place] Barriers to Discharge: Continued Medical Work up  Expected Discharge Plan and Services       Living arrangements for the past 2 months: Single Family Home                                       Social Determinants of Health (SDOH) Interventions SDOH Screenings   Food Insecurity: No Food Insecurity (12/09/2022)  Housing: Low Risk  (12/09/2022)  Transportation Needs: No Transportation Needs (12/09/2022)  Utilities: Not At Risk (12/09/2022)  Tobacco Use: Medium Risk (12/16/2022)    Readmission Risk Interventions    12/03/2022    1:56 PM 10/27/2022    9:24 AM  Readmission Risk Prevention Plan  Post Dischage Appt  Complete  Medication Screening  Complete  Transportation Screening Complete Complete  PCP or Specialist Appt within 5-7 Days Complete   Home Care Screening Complete   Medication Review (RN CM) Complete

## 2022-12-18 NOTE — Progress Notes (Signed)
OT Cancellation Note  Patient Details Name: Ian Estes MRN: [id number] DOB: 1940-11-29   Cancelled Treatment:    Reason Eval/Treat Not Completed: Pain limiting ability to participate: Attempted OT again this afternoon, however pt declined as he recently took pain meds, "They're just beginning to work and I don't want to move and get into pain again." Pt's needs met prior to OT leaving room and RN notified.    Julien Girt 12/18/2022, 1:54 PM

## 2022-12-18 NOTE — Progress Notes (Signed)
PROGRESS NOTE    Ian Estes  [id number] DOB: 1940-12-06 DOA: 12/09/2022 PCP: Aletha Halim., PA-C     Brief Narrative:  Ian Estes is a 82 y.o. male with medical history significant of CKD stage III, A-fib on Eliquis, hypertension, hyperlipidemia who presents with abdominal pain and nausea. He was admitted from 1/7 to 1/12 secondary to acute sigmoid diverticulitis with contained perforation/abscess. He was also admitted again 1/21 to 1/25 secondary to recurrent diverticulitis. Admitted again 2/14 to 2/16 secondary to small bowel obstruction.  At that time, his small bowel obstruction resolved with conservative measures.  He was recommended for outpatient colonoscopy. Pt returned with recurrent abdominal pain, found to have recurrent SBO. General surgery was consulted. Due to hematochezia, GI consulted.  Patient underwent colonoscopy by Dr. Benson Norway 2/23.  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 Pt underwent open R hemicolectomy on 12/15/22 with lysis of adhesion.   New events last 24 hours / Subjective: Last night, patient went to use the bathroom, and had a mechanical fall.  Family/sister present at bedside, upset about situation I wanted to talk to you today administrators.  Unsure exactly if patient asks for help to the bathroom or not.  Patient reported that he did and was not attended to.  Today pt complained of generalized body aches.    Assessment & Plan:  Principal Problem:   SBO (small bowel obstruction) (HCC) Active Problems:   Chronic atrial fibrillation (HCC)   Chronic kidney disease, stage 3a (HCC)   Hypertension   Colonic mass   Hyperkalemia   Recurrent small bowel obstruction, recent history of diverticulitis R colon adenocarcinoma S/p laparoscopic converted to open right hemicolectomy and lysis of adhesions on 12/15/2022 Status post colonoscopy  2/23, by Dr. Benson Norway Pathology revealed invasive moderately differentiated adenocarcinoma CEA 4.9 General surgery on board, status post right hemicolectomy- advanced diet Surgical pathology confirmed colon ca, with clear margins Primary attending Dr. Maylene Roes, discussed with Dr. Chryl Heck, oncology, rec Outpatient referral to hematology/oncology placed  AKI Cr bumped to 2.23, trending down nicely D/C IV fluids Daily BMP  Hyponatremia S/P IV fluids Daily BMP   Chronic A-fib Resume metoprolol, hold Eliquis (last dose 2/21 AM)   Mechanical fall Denies hitting head or passing out Plain film images without any fracture or any acute issues Fall precautions PT/OT    DVT prophylaxis:  enoxaparin (LOVENOX) injection 40 mg Start: 12/16/22 1200 SCDs Start: 12/09/22 1744  Code Status: DNR Family Communication: None at bedside  Disposition Plan:  Status is: Inpatient Remains inpatient appropriate because: Level of care  Consultants:  General surgery GI  Antimicrobials:  Anti-infectives (From admission, onward)    Start     Dose/Rate Route Frequency Ordered Stop   12/15/22 0915  cefoTEtan (CEFOTAN) 2 g in sodium chloride 0.9 % 100 mL IVPB        2 g 200 mL/hr over 30 Minutes Intravenous On call to O.R. 12/15/22 0906 12/15/22 1123   12/15/22 0909  sodium chloride 0.9 % with cefoTEtan (CEFOTAN) ADS Med       Note to Pharmacy: [name] B: cabinet override      12/15/22 0909 12/15/22 1122        Objective: Vitals:   12/18/22 0524 12/18/22 0630 12/18/22 1039 12/18/22 1315  BP: 116/72 123/79 129/85 123/74  Pulse: 79 76 91 90  Resp: '16 18 20 20  '$ Temp: 97.9 F (36.6 C) (!) 97.5 F (  36.4 C)  97.7 F (36.5 C)  TempSrc: Oral Oral  Oral  SpO2: 99% 99% 100% 100%  Weight:      Height:        Intake/Output Summary (Last 24 hours) at 12/18/2022 1611 Last data filed at 12/18/2022 0550 Gross per 24 hour  Intake 1726.75 ml  Output 1275 ml  Net 451.75 ml   Filed Weights   12/09/22  1415 12/15/22 0807  Weight: 89.4 kg 89.4 kg    Examination:  General: NAD Cardiovascular: S1, S2 present Respiratory: CTAB Abdomen: Soft, tender, nondistended, no bowel sounds, dressing intact, with minimal drainage  Musculoskeletal: No bilateral pedal edema noted Skin: Normal Psychiatry: Normal mood    Data Reviewed: I have personally reviewed following labs and imaging studies  CBC: Recent Labs  Lab 12/12/22 0923 12/14/22 0404 12/16/22 0541 12/17/22 0759 12/18/22 0536  WBC 8.4 6.5 7.8 8.8 6.8  NEUTROABS  --   --   --  6.6 4.8  HGB 9.8* 9.8* 11.7* 10.4* 9.2*  HCT 32.3* 32.6* 40.0 34.2* 30.2*  MCV 86.6 86.0 90.1 85.5 85.6  PLT 203 227 324 316 [id number]   Basic Metabolic Panel: Recent Labs  Lab 12/12/22 0546 12/14/22 0404 12/16/22 0541 12/17/22 0759 12/18/22 0536  NA 134* 133* 132* 136 135  K 4.5 4.0 4.8 4.1 3.5  CL 104 103 101 106 108  CO2 22 22 18* 21* 20*  GLUCOSE 125* 88 122* 101* 95  BUN 8 10 27* 36* 28*  CREATININE 1.09 1.05 2.23* 2.06* 1.35*  CALCIUM 8.3* 8.0* 7.8* 7.9* 7.4*   GFR: Estimated Creatinine Clearance: 46.3 mL/min (A) (by C-G formula based on SCr of 1.35 mg/dL (H)). Liver Function Tests: No results for input(s): "AST", "ALT", "ALKPHOS", "BILITOT", "PROT", "ALBUMIN" in the last 168 hours.  No results for input(s): "LIPASE", "AMYLASE" in the last 168 hours. No results for input(s): "AMMONIA" in the last 168 hours. Coagulation Profile: No results for input(s): "INR", "PROTIME" in the last 168 hours. Cardiac Enzymes: No results for input(s): "CKTOTAL", "CKMB", "CKMBINDEX", "TROPONINI" in the last 168 hours. BNP (last 3 results) No results for input(s): "PROBNP" in the last 8760 hours. HbA1C: No results for input(s): "HGBA1C" in the last 72 hours. CBG: Recent Labs  Lab 12/15/22 0636 12/15/22 1348  GLUCAP 82 105*   Lipid Profile: No results for input(s): "CHOL", "HDL", "LDLCALC", "TRIG", "CHOLHDL", "LDLDIRECT" in the last 72 hours. Thyroid  Function Tests: No results for input(s): "TSH", "T4TOTAL", "FREET4", "T3FREE", "THYROIDAB" in the last 72 hours. Anemia Panel: No results for input(s): "VITAMINB12", "FOLATE", "FERRITIN", "TIBC", "IRON", "RETICCTPCT" in the last 72 hours. Sepsis Labs: No results for input(s): "PROCALCITON", "LATICACIDVEN" in the last 168 hours.  No results found for this or any previous visit (from the past 240 hour(s)).    Radiology Studies: DG Ribs Bilateral  Result Date: 12/18/2022 CLINICAL DATA:  Fall EXAM: BILATERAL RIBS - 3+ VIEW COMPARISON:  None Available. FINDINGS: No fracture or other bone lesions are seen involving the ribs. IMPRESSION: Negative. Electronically Signed   By: Margaretha Sheffield M.D.   On: 12/18/2022 08:20   DG Forearm Left  Result Date: 12/18/2022 CLINICAL DATA:  Unwitnessed fall.  Left hand swelling and bruising EXAM: LEFT FOREARM - 2 VIEW COMPARISON:  None Available. FINDINGS: No acute fracture or dislocation. Generalized osteopenia. Metallic foreign body measuring 2 mm at the medial forearm, age indeterminate. Atheromatous calcification. IMPRESSION: 1. No acute fracture dislocation. 2. 2 mm metallic foreign body at the medial forearm.  Electronically Signed   By: Jorje Guild M.D.   On: 12/18/2022 08:20   DG Hand 2 View Left  Result Date: 12/18/2022 CLINICAL DATA:  Unwitnessed fall with left hand swelling. EXAM: LEFT HAND - 2 VIEW COMPARISON:  None Available. FINDINGS: No acute fracture or dislocation. MCP and interphalangeal osteoarthritis with erosive changes at the D IP joint of the index, middle, and little fingers. Subjective osteopenia. IMPRESSION: 1. No acute finding. 2. Erosive osteoarthritis. Electronically Signed   By: Jorje Guild M.D.   On: 12/18/2022 08:19      Scheduled Meds:  acetaminophen  650 mg Oral QID   Chlorhexidine Gluconate Cloth  6 each Topical Daily   enoxaparin (LOVENOX) injection  40 mg Subcutaneous Q24H   lidocaine  1 patch Transdermal Q24H    metoprolol succinate  12.5 mg Oral Daily   sodium chloride flush  3 mL Intravenous Q12H   Continuous Infusions:  methocarbamol (ROBAXIN) IV 500 mg (12/18/22 1042)   promethazine (PHENERGAN) injection (IM or IVPB) 12.5 mg (12/14/22 1532)      LOS: 9 days     Alma Friendly, MD [facility] 12/18/2022, 4:11 PM   Available via Epic secure chat 7am-7pm After these hours, please refer to coverage provider listed on [email or web address]

## 2022-12-18 NOTE — Progress Notes (Signed)
   12/18/22 0647  Musculoskeletal Details  RUE Full movement  LUE Full movement  RLE Full movement  LLE Full movement

## 2022-12-18 NOTE — Progress Notes (Signed)
   12/18/22 0647  What Happened  Was fall witnessed? No  Was patient injured? Unsure  Patient found other (Comment) (in bed)  Found by No one-pt stated they fell  Stated prior activity other (comment) (assisted to bathrrom)  Provider Notification  Provider Name/Title Zebedee Iba, NP  Date Provider Notified 12/18/22  Time Provider Notified [phone]  Method of Notification Page (secure message)  Notification Reason Fall  Provider response See new orders  Date of Provider Response 12/18/22  Time of Provider Response [phone]  Follow Up  Family notified Yes - comment  Time family notified 0657  Progress note created (see row info) Yes  Adult Fall Risk Assessment  Risk Factor Category (scoring not indicated) Fall has occurred during this admission (document High fall risk)  Age 82  Fall History: Fall within 6 months prior to admission 0  Elimination; Bowel and/or Urine Incontinence 0  Elimination; Bowel and/or Urine Urgency/Frequency 0  Medications: includes PCA/Opiates, Anti-convulsants, Anti-hypertensives, Diuretics, Hypnotics, Laxatives, Sedatives, and Psychotropics 3  Patient Care Equipment 2  Mobility-Assistance 2  Mobility-Gait 2  Mobility-Sensory Deficit 0  Altered awareness of immediate physical environment 0  Impulsiveness 0  Lack of understanding of one's physical/cognitive limitations 0  Total Score 12  Patient Fall Risk Level High fall risk  Adult Fall Risk Interventions  Required Bundle Interventions *See Row Information* Moderate fall risk - low and moderate requirements implemented  Additional Interventions Use of appropriate toileting equipment (bedpan, BSC, etc.);PT/OT need assessed if change in mobility from baseline  Fall intervention(s) refused/Patient educated regarding refusal Nonskid socks;Bed alarm  Neurological  Neuro (WDL) WDL  Orientation Level Oriented X4  Cognition Appropriate at baseline;Follows commands;No memory impairment  Speech Clear  R Pupil Size (mm)  3  R Pupil Shape Round  R Pupil Reaction Brisk  L Pupil Size (mm) 3  L Pupil Shape Round  L Pupil Reaction Brisk

## 2022-12-18 NOTE — Progress Notes (Signed)
3 Days Post-Op   Subjective/Chief Complaint: Creatinine improving, good UOP. Tolerating clear liquids, reports having loose bowel movements this morning. No nausea or vomiting. Had a fall while using the bathroom, did not hit head, oriented on exam.   Objective: Vital signs in last 24 hours: Temp:  [97.5 F (36.4 C)-98.7 F (37.1 C)] 97.5 F (36.4 C) (03/01 0630) Pulse Rate:  [70-79] 76 (03/01 0630) Resp:  [16-18] 18 (03/01 0630) BP: (105-123)/(63-79) 123/79 (03/01 0630) SpO2:  [84 %-100 %] 99 % (03/01 0630) Last BM Date : 12/10/22  Intake/Output from previous day: 02/29 0701 - 03/01 0700 In: 2807.9 [P.O.:800; I.V.:2007.9] Out: 1725 [Urine:1725] Intake/Output this shift: No intake/output data recorded.  General: resting comfortably, NAD Neuro: alert and oriented, no focal deficits Resp: normal work of breathing on room air Abdomen: soft, nondistended, incisions clean and dry, honeycomb in place over midline incision. Extremities: warm and well-perfused GU: foley draining light yellow urine   Lab Results:  Recent Labs    12/17/22 0759 12/18/22 0536  WBC 8.8 6.8  HGB 10.4* 9.2*  HCT 34.2* 30.2*  PLT 316 238   BMET Recent Labs    12/17/22 0759 12/18/22 0536  NA 136 135  K 4.1 3.5  CL 106 108  CO2 21* 20*  GLUCOSE 101* 95  BUN 36* 28*  CREATININE 2.06* 1.35*  CALCIUM 7.9* 7.4*   PT/INR No results for input(s): "LABPROT", "INR" in the last 72 hours. ABG No results for input(s): "PHART", "HCO3" in the last 72 hours.  Invalid input(s): "PCO2", "PO2"  Studies/Results: DG Ribs Bilateral  Result Date: 12/18/2022 CLINICAL DATA:  Fall EXAM: BILATERAL RIBS - 3+ VIEW COMPARISON:  None Available. FINDINGS: No fracture or other bone lesions are seen involving the ribs. IMPRESSION: Negative. Electronically Signed   By: Margaretha Sheffield M.D.   On: 12/18/2022 08:20   DG Forearm Left  Result Date: 12/18/2022 CLINICAL DATA:  Unwitnessed fall.  Left hand swelling and  bruising EXAM: LEFT FOREARM - 2 VIEW COMPARISON:  None Available. FINDINGS: No acute fracture or dislocation. Generalized osteopenia. Metallic foreign body measuring 2 mm at the medial forearm, age indeterminate. Atheromatous calcification. IMPRESSION: 1. No acute fracture dislocation. 2. 2 mm metallic foreign body at the medial forearm. Electronically Signed   By: Jorje Guild M.D.   On: 12/18/2022 08:20   DG Hand 2 View Left  Result Date: 12/18/2022 CLINICAL DATA:  Unwitnessed fall with left hand swelling. EXAM: LEFT HAND - 2 VIEW COMPARISON:  None Available. FINDINGS: No acute fracture or dislocation. MCP and interphalangeal osteoarthritis with erosive changes at the D IP joint of the index, middle, and little fingers. Subjective osteopenia. IMPRESSION: 1. No acute finding. 2. Erosive osteoarthritis. Electronically Signed   By: Jorje Guild M.D.   On: 12/18/2022 08:19       Assessment/Plan: s/p Procedure(s): LAPAROSCOPIC HAND ASSISTED RIGHT HEMI COLECTOMY, POSSIBLE OPEN (N/A)   SBO, recurrent, secondary to adhesive disease Right colon adenocarcinoma POD3 s/p laparoscopic converted to open right hemicolectomy and lysis of adhesions. - Advance to soft diet, SLIV - AKI: Improving, remove foley - Multimodal pain control - Surgical pathology shows a pT3N0 adenocarcinoma with negative margins. I reviewed this with the patient and his sister this morning. - Fall this morning: plain films negative, no extremity or rib fractures noted, no chest wall pain on exam today. - Mobilize, PT following - Pulmonary toilet - VTE: lovenox, SCDs. Ok to resume home Eliquis from a surgical standpoint.  LOS: 9 days    Dwan Bolt 12/18/2022

## 2022-12-18 NOTE — Progress Notes (Signed)
PT Cancellation Note  Patient Details Name: Ian Estes MRN: [id number] DOB: Mar 03, 1941   Cancelled Treatment:     Per chart review, pt fell in bathroom this morning.  Awaiting X Ray results before attempting any PT.  Attempted to see in PM after X rays show no acute issues, however pt politely declined.  "I'm just now getting comfortable (pain earlier) and not up to walking today".  Pt did ask if we could "come back tomorrow".     Rica Koyanagi  PTA Acute  Rehabilitation Services Office M-F          [phone] Weekend pager [phone]

## 2022-12-18 NOTE — Discharge Instructions (Signed)
[facility]      [facility] [phone]  OPEN ABDOMINAL SURGERY: POST OP INSTRUCTIONS  Always review your discharge instruction sheet given to you by the facility where your surgery was performed.  IF YOU HAVE DISABILITY OR FAMILY LEAVE FORMS, YOU MUST BRING THEM TO THE OFFICE FOR PROCESSING.  PLEASE DO NOT GIVE THEM TO YOUR DOCTOR.  A prescription for pain medication may be given to you upon discharge.  Take your pain medication as prescribed, if needed.  If narcotic pain medicine is not needed, then you may take acetaminophen (Tylenol) or ibuprofen (Advil) as needed. Take your usually prescribed medications unless otherwise directed. If you need a refill on your pain medication, please contact your pharmacy. They will contact our office to request authorization.  Prescriptions will not be filled after 5pm or on week-ends. You should follow a light diet the first few days after arrival home, such as soup and crackers, pudding, etc.unless your doctor has advised otherwise. A high-fiber, low fat diet can be resumed as tolerated.   Be sure to include lots of fluids daily. Most patients will experience some swelling and bruising on the chest and neck area.  Ice packs will help.  Swelling and bruising can take several days to resolve Most patients will experience some swelling and bruising in the area of the incision. Ice pack will help. Swelling and bruising can take several days to resolve..  It is common to experience some constipation if taking pain medication after surgery.  Increasing fluid intake and taking a stool softener will usually help or prevent this problem from occurring.  A mild laxative (Milk of Magnesia or Miralax) should be taken according to package directions if there are no bowel movements after 48 hours.  You may have steri-strips (small skin tapes) in place directly over the incision.  These strips should be left on the skin for 7-10 days.  If your surgeon used skin  glue on the incision, you may shower in 24 hours.  The glue will flake off over the next 2-3 weeks.  Any sutures or staples will be removed at the office during your follow-up visit. You may find that a light gauze bandage over your incision may keep your staples from being rubbed or pulled. You may shower and replace the bandage daily. ACTIVITIES:  You may resume regular (light) daily activities beginning the next day--such as daily self-care, walking, climbing stairs--gradually increasing activities as tolerated.  You may have sexual intercourse when it is comfortable.  Refrain from any heavy lifting or straining until approved by your doctor. You may drive when you no longer are taking prescription pain medication, you can comfortably wear a seatbelt, and you can safely maneuver your car and apply brakes Return to Work: ___________________________________ You should see your doctor in the office for a follow-up appointment approximately two weeks after your surgery.  Make sure that you call for this appointment within a day or two after you arrive home to insure a convenient appointment time. OTHER INSTRUCTIONS:  _____________________________________________________________ _____________________________________________________________  WHEN TO CALL YOUR DOCTOR: Fever over 101.0 Inability to urinate Nausea and/or vomiting Extreme swelling or bruising Continued bleeding from incision. Increased pain, redness, or drainage from the incision. Difficulty swallowing or breathing Muscle cramping or spasms. Numbness or tingling in hands or feet or around lips.  The clinic staff is available to answer your questions during regular business hours.  Please don't hesitate to call and ask to speak to one of   the nurses if you have concerns.  For further questions, please visit [email or web address]    Managing Your Pain After Surgery Without Opioids    Thank you for participating in our  program to help patients manage their pain after surgery without opioids. This is part of our effort to provide you with the best care possible, without exposing you or your family to the risk that opioids pose.  What pain can I expect after surgery? You can expect to have some pain after surgery. This is normal. The pain is typically worse the day after surgery, and quickly begins to get better. Many studies have found that many patients are able to manage their pain after surgery with Over-the-Counter (OTC) medications such as Tylenol and Motrin. If you have a condition that does not allow you to take Tylenol or Motrin, notify your surgical team.  How will I manage my pain? The best strategy for controlling your pain after surgery is around the clock pain control with Tylenol (acetaminophen) and Motrin (ibuprofen or Advil). Alternating these medications with each other allows you to maximize your pain control. In addition to Tylenol and Motrin, you can use heating pads or ice packs on your incisions to help reduce your pain.  How will I alternate your regular strength over-the-counter pain medication? You will take a dose of pain medication every three hours. Start by taking 650 mg of Tylenol (2 pills of 325 mg) 3 hours later take 600 mg of Motrin (3 pills of 200 mg) 3 hours after taking the Motrin take 650 mg of Tylenol 3 hours after that take 600 mg of Motrin.   - 1 -  See example - if your first dose of Tylenol is at 12:00 PM   12:00 PM Tylenol 650 mg (2 pills of 325 mg)  3:00 PM Motrin 600 mg (3 pills of 200 mg)  6:00 PM Tylenol 650 mg (2 pills of 325 mg)  9:00 PM Motrin 600 mg (3 pills of 200 mg)  Continue alternating every 3 hours   We recommend that you follow this schedule around-the-clock for at least 3 days after surgery, or until you feel that it is no longer needed. Use the table on the last page of this handout to keep track of the medications you are  taking. Important: Do not take more than '3000mg'$  of Tylenol or '3200mg'$  of Motrin in a 24-hour period. Do not take ibuprofen/Motrin if you have a history of bleeding stomach ulcers, severe kidney disease, &/or actively taking a blood thinner  What if I still have pain? If you have pain that is not controlled with the over-the-counter pain medications (Tylenol and Motrin or Advil) you might have what we call "breakthrough" pain. You will receive a prescription for a small amount of an opioid pain medication such as Oxycodone, Tramadol, or Tylenol with Codeine. Use these opioid pills in the first 24 hours after surgery if you have breakthrough pain. Do not take more than 1 pill every 4-6 hours.  If you still have uncontrolled pain after using all opioid pills, don't hesitate to call our staff using the number provided. We will help make sure you are managing your pain in the best way possible, and if necessary, we can provide a prescription for additional pain medication.   Day 1    Time  Name of Medication Number of pills taken  Amount of Acetaminophen  Pain Level   Comments  AM PM  AM PM       AM PM       AM PM       AM PM       AM PM       AM PM       AM PM       Total Daily amount of Acetaminophen Do not take more than  3,000 mg per day      Day 2    Time  Name of Medication Number of pills taken  Amount of Acetaminophen  Pain Level   Comments  AM PM       AM PM       AM PM       AM PM       AM PM       AM PM       AM PM       AM PM       Total Daily amount of Acetaminophen Do not take more than  3,000 mg per day      Day 3    Time  Name of Medication Number of pills taken  Amount of Acetaminophen  Pain Level   Comments  AM PM       AM PM       AM PM       AM PM         AM PM       AM PM       AM PM       AM PM       Total Daily amount of Acetaminophen Do not take more than  3,000 mg per day      Day 4    Time  Name of Medication Number  of pills taken  Amount of Acetaminophen  Pain Level   Comments  AM PM       AM PM       AM PM       AM PM       AM PM       AM PM       AM PM       AM PM       Total Daily amount of Acetaminophen Do not take more than  3,000 mg per day      Day 5    Time  Name of Medication Number of pills taken  Amount of Acetaminophen  Pain Level   Comments  AM PM       AM PM       AM PM       AM PM       AM PM       AM PM       AM PM       AM PM       Total Daily amount of Acetaminophen Do not take more than  3,000 mg per day      Day 6    Time  Name of Medication Number of pills taken  Amount of Acetaminophen  Pain Level  Comments  AM PM       AM PM       AM PM       AM PM       AM PM       AM PM       AM PM       AM PM  Total Daily amount of Acetaminophen Do not take more than  3,000 mg per day      Day 7    Time  Name of Medication Number of pills taken  Amount of Acetaminophen  Pain Level   Comments  AM PM       AM PM       AM PM       AM PM       AM PM       AM PM       AM PM       AM PM       Total Daily amount of Acetaminophen Do not take more than  3,000 mg per day        For additional information about how and where to safely dispose of unused opioid medications - [employer]  Disclaimer: This document contains information and/or instructional materials adapted from [place] for the typical patient with your condition. It does not replace medical advice from your health care provider because your experience may differ from that of the typical patient. Talk to your health care provider if you have any questions about this document, your condition or your treatment plan. Adapted from [place]

## 2022-12-18 NOTE — Progress Notes (Signed)
       CROSS COVER NOTE  NAME: Ian Estes MRN: [id number] DOB : 10/02/1941    Date of Service   12/18/2022     HPI/Events of Note   Patient had unwitnessed fall.   He denies hitting his head. Per RN patient complains of pain to right rib(s) and left arm/ hand. Will order images.  No change in range of motion or skin integrity was reported.  No obvious signs of fracture or dislocation noted by nursing staff.   Interventions/ Plan   Xrays- pending       Raenette Rover, DNP, [place]

## 2022-12-18 NOTE — Progress Notes (Signed)
OT Cancellation Note  Patient Details Name: Ian Estes MRN: [id number] DOB: Jun 20, 1941   Cancelled Treatment:    Reason Eval/Treat Not Completed: Patient declined, no reason specified: OT entered room and pt's family member waved OT away. Appeared to be another staff member present. OT will continue efforts as time allows.   Julien Girt 12/18/2022, 10:28 AM

## 2022-12-18 NOTE — Progress Notes (Signed)
   12/18/22 0647  Musculoskeletal  Musculoskeletal (WDL) X  Assistive Device Front wheel walker  Generalized Weakness Yes  Weight Bearing Restrictions No  Musculoskeletal Details  RUE Full movement  LUE Full movement  RLE Full movement  LLE Full movement  Integumentary  Integumentary (WDL) X  Skin Color Appropriate for ethnicity  Skin Condition Dry  Skin Integrity Intact  Ecchymosis Location Arm  Ecchymosis Location Orientation Bilateral  Skin Turgor Non-tenting

## 2022-12-18 NOTE — Progress Notes (Signed)
Patient started on solid food, not much meal intake. Rating pain 7/8 out of 10 pain scale, more abdominal pain,  PRN pain med given. Foley removed, patient voiding.

## 2022-12-18 NOTE — Progress Notes (Signed)
During this a.m. medication pass this nurse observed another RN providing peri care to this patient, he stated that, "She ( the nurse tech) left me in that bathroom and I fell, she saw me and left me." The patient went on to say"I got up on my own, my left arm and hand hurts." Patient with AROM to LUE without incident. This RN notifying the provider on duty.

## 2022-12-19 DIAGNOSIS — K56609 Unspecified intestinal obstruction, unspecified as to partial versus complete obstruction: Secondary | ICD-10-CM | POA: Diagnosis not present

## 2022-12-19 DIAGNOSIS — I10 Essential (primary) hypertension: Secondary | ICD-10-CM | POA: Diagnosis not present

## 2022-12-19 DIAGNOSIS — K6389 Other specified diseases of intestine: Secondary | ICD-10-CM | POA: Diagnosis not present

## 2022-12-19 LAB — CBC WITH DIFFERENTIAL/PLATELET
Abs Immature Granulocytes: 0.04 10*3/uL (ref 0.00–0.07)
Basophils Absolute: 0 10*3/uL (ref 0.0–0.1)
Basophils Relative: 0 %
Eosinophils Absolute: 0.2 10*3/uL (ref 0.0–0.5)
Eosinophils Relative: 3 %
HCT: 29.9 % — ABNORMAL LOW (ref 39.0–52.0)
Hemoglobin: 9.1 g/dL — ABNORMAL LOW (ref 13.0–17.0)
Immature Granulocytes: 1 %
Lymphocytes Relative: 16 %
Lymphs Abs: 1.1 10*3/uL (ref 0.7–4.0)
MCH: 25.9 pg — ABNORMAL LOW (ref 26.0–34.0)
MCHC: 30.4 g/dL (ref 30.0–36.0)
MCV: 84.9 fL (ref 80.0–100.0)
Monocytes Absolute: 0.7 10*3/uL (ref 0.1–1.0)
Monocytes Relative: 10 %
Neutro Abs: 4.8 10*3/uL (ref 1.7–7.7)
Neutrophils Relative %: 70 %
Platelets: 222 10*3/uL (ref 150–400)
RBC: 3.52 MIL/uL — ABNORMAL LOW (ref 4.22–5.81)
RDW: 16.3 % — ABNORMAL HIGH (ref 11.5–15.5)
WBC: 6.8 10*3/uL (ref 4.0–10.5)
nRBC: 0 % (ref 0.0–0.2)

## 2022-12-19 LAB — BASIC METABOLIC PANEL
Anion gap: 7 (ref 5–15)
BUN: 21 mg/dL (ref 8–23)
CO2: 21 mmol/L — ABNORMAL LOW (ref 22–32)
Calcium: 7.6 mg/dL — ABNORMAL LOW (ref 8.9–10.3)
Chloride: 110 mmol/L (ref 98–111)
Creatinine, Ser: 1.09 mg/dL (ref 0.61–1.24)
GFR, Estimated: >60 mL/min (ref >60)
Glucose, Bld: 93 mg/dL (ref 70–99)
Potassium: 3.7 mmol/L (ref 3.5–5.1)
Sodium: 138 mmol/L (ref 135–145)

## 2022-12-19 MED ORDER — APIXABAN 5 MG PO TABS
5.0000 mg | ORAL_TABLET | Freq: Two times a day (BID) | ORAL | Status: DC
  Administered 2022-12-19 – 2022-12-24 (×10): 5 mg via ORAL
  Filled 2022-12-19 (×10): qty 1

## 2022-12-19 NOTE — Progress Notes (Signed)
PT Cancellation Note  Patient Details Name: Ian Estes MRN: [id number] DOB: 03/21/1941   Cancelled Treatment:    Reason Eval/Treat Not Completed: (P) Patient declined, no reason specified; pt reporting "I already worked with rehab today," citing previous OT session. Pt declining despite max encouragement and a variety of functional activities presented: transfer to the chair, ambulation, bathroom visit, bed level exercises; pt remained adamant. Pt agreeable to follow up and check back tomorrow. We will continue to follow acutely.  Coolidge Breeze, PT, DPT [facility] Department Office: [phone] Weekend pager: [phone]   Coolidge Breeze 12/19/2022, 2:44 PM

## 2022-12-19 NOTE — Evaluation (Signed)
Occupational Therapy Evaluation Patient Details Name: Ian Estes MRN: [id number] DOB: [birth year] Today's Date: 12/19/2022   History of Present Illness Patient is a 82 year old male who presented on 2/21 with recurrent abdominal pain. Patient underwent colonoscopy on 2/23 that revealed malignant tumor in ascending colon. 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 on 12/15/22 ,Pt underwent open R hemicolectomy with lysis of adhesion.of note patient reporting having a fall at this hospital on 3/1 with imaging negative for acute injury, [id number] from 1/7 to 1/12 secondary to acute sigmoid diverticulitis with contained perforation/abscess. He was also admitted again 1/21 to 1/25 secondary to recurrent diverticulitis. Admitted again 2/14 to 2/16 secondary to small bowel obstruction.   Clinical Impression   Patient is a 82 year old male who was admitted for above. Patient was living at home with son while his house is being built next door with no AD per patient report. Currently, patient is mod A for bed mobility and transfers with increased pain reported in abdomen while following precautions. Patient demonstrated self limiting behaviors declining to engage in almost all tasks offered during session. Patient would continue to benefit from skilled OT services at this time while admitted and after d/c to address noted deficits in order to improve overall safety and independence in ADLs.       Recommendations for follow up therapy are one component of a multi-disciplinary discharge planning process, led by the attending physician.  Recommendations may be updated based on patient status, additional functional criteria and insurance authorization.   Follow Up Recommendations  Skilled nursing-short term rehab (<3 hours/day)     Assistance Recommended at Discharge Frequent or constant Supervision/Assistance  Patient can return home with the following A  lot of help with bathing/dressing/bathroom;A lot of help with walking and/or transfers;Direct supervision/assist for financial management;Help with stairs or ramp for entrance;Assist for transportation;Direct supervision/assist for medications management;Assistance with cooking/housework    Functional Status Assessment  Patient has had a recent decline in their functional status and demonstrates the ability to make significant improvements in function in a reasonable and predictable amount of time.  Equipment Recommendations  None recommended by OT       Precautions / Restrictions Precautions Precautions: Fall Precaution Comments: abd surgery Restrictions Weight Bearing Restrictions: No      Mobility Bed Mobility Overal bed mobility: Needs Assistance Bed Mobility: Supine to Sit     Supine to sit: Mod assist     General bed mobility comments: with education on log rolling and sequencing to reduce pulling on belly. patient needed to use bed railswith increased time to compelte task.        Balance Overall balance assessment: Needs assistance Sitting-balance support: Feet supported Sitting balance-Leahy Scale: Fair     Standing balance support: Bilateral upper extremity supported, During functional activity, Reliant on assistive device for balance Standing balance-Leahy Scale: Poor           ADL either performed or assessed with clinical judgement   ADL Overall ADL's : Needs assistance/impaired Eating/Feeding: Modified independent;Sitting   Grooming: Wash/dry face;Set up;Sitting Grooming Details (indicate cue type and reason): patient declined to brush teeth or wash up reporting that he "would do it later" nurse tech made aware. Upper Body Bathing: Set up;Sitting Upper Body Bathing Details (indicate cue type and reason): see above note. suimulated task would indicate patient is ableto complete bathign with Set up for UB Lower Body Bathing: Maximal assistance;Sit  to/from stand;Sitting/lateral leans Lower Body Bathing  Details (indicate cue type and reason): patient with increased pain in abdomen with movements for simulated reaching tasks. and unable to complete figure four postiioning Upper Body Dressing : Set up;Sitting   Lower Body Dressing: Maximal assistance;Sit to/from stand;Sitting/lateral leans   Toilet Transfer: Moderate assistance;Stand-pivot Toilet Transfer Details (indicate cue type and reason): to recliner with patient declining to use RW but noted to be unsteady. patient needed mod A for push up off recliner to place chair alarm under patient. Toileting- Clothing Manipulation and Hygiene: Maximal assistance;Sit to/from stand               Vision Baseline Vision/History: 1 Wears glasses              Pertinent Vitals/Pain Pain Assessment Pain Assessment: 0-10 Pain Score:  ("right next to a 10") Pain Location: abdomen Pain Descriptors / Indicators: Operative site guarding, Grimacing Pain Intervention(s): Limited activity within patient's tolerance, Monitored during session, Repositioned, Premedicated before session     Hand Dominance Right   Extremity/Trunk Assessment Upper Extremity Assessment Upper Extremity Assessment: LUE deficits/detail;RUE deficits/detail RUE Deficits / Details: noted to have signs of possible arthritis in digits joints bilaterally. shoulder wfl. noted alot of brusing on BUE LUE Deficits / Details: MMT not tested with large brusing on arms from IV draws per patient report.           Communication Communication Communication: No difficulties   Cognition Arousal/Alertness: Awake/alert Behavior During Therapy: WFL for tasks assessed/performed Overall Cognitive Status: Within Functional Limits for tasks assessed     General Comments: patient was cooperative but self limiting at times.                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: lives with son currently while building of his house being completed; denies falls      Prior Functioning/Environment Prior Level of Function : Independent/Modified Independent;Driving                        OT Problem List: Decreased activity tolerance;Impaired balance (sitting and/or standing);Decreased coordination;Decreased knowledge of precautions;Decreased knowledge of use of DME or AE;Pain;Decreased safety awareness      OT Treatment/Interventions: Self-care/ADL training;Energy conservation;Therapeutic exercise;DME and/or AE instruction;Therapeutic activities;Patient/family education;Balance training    OT Goals(Current goals can be found in the care plan section) Acute Rehab OT Goals Patient Stated Goal: to go back to sons house OT Goal Formulation: With patient Time For Goal Achievement: 01/02/23 Potential to Achieve Goals: Fair  OT Frequency: Min 2X/week       AM-PAC OT "6 Clicks" Daily Activity     Outcome Measure Help from another person eating meals?: A Little Help from another person taking care of personal grooming?: A Little Help from another person toileting, which includes using toliet, bedpan, or urinal?: A Lot Help from another person bathing (including washing, rinsing, drying)?: A Lot Help from another person to put on and taking off regular upper body clothing?: A Little Help from another person to put on and taking off regular lower body clothing?: A Lot 6 Click Score: 15   End of  Session Nurse Communication: Other (comment) (ok to participate in session)  Activity Tolerance: Patient  tolerated treatment well Patient left: in chair;with call bell/phone within reach;with chair alarm set  OT Visit Diagnosis: Unsteadiness on feet (R26.81);Other abnormalities of gait and mobility (R26.89);Muscle weakness (generalized) (M62.81);Pain                Time: [id number] OT Time Calculation (min): 25 min Charges:  OT General Charges $OT Visit: 1 Visit OT Evaluation $OT Eval Moderate Complexity: 1 Mod OT Treatments $Therapeutic Activity: 8-22 mins  Rennie Plowman, MS Acute Rehabilitation Department Office# [phone]   Willa Rough 12/19/2022, 9:23 AM

## 2022-12-19 NOTE — Plan of Care (Signed)
  Problem: Activity: Goal: Risk for activity intolerance will decrease Outcome: Progressing   Problem: Pain Managment: Goal: General experience of comfort will improve Outcome: Progressing   

## 2022-12-19 NOTE — Progress Notes (Signed)
4 Days Post-Op   Subjective/Chief Complaint: Patient is tolerating diet No nausea or vomiting Patient's sister is at bedside - concerned about him going home.  May need rehab Patient is not able to get out of bed without assistance  Objective: Vital signs in last 24 hours: Temp:  [97.4 F (36.3 C)-97.8 F (36.6 C)] 97.4 F (36.3 C) (03/02 0419) Pulse Rate:  [70-90] 70 (03/02 0834) Resp:  [19-20] 19 (03/02 0419) BP: (102-123)/(67-74) 114/67 (03/02 0834) SpO2:  [97 %-100 %] 97 % (03/02 0419) Last BM Date : 12/10/22  Intake/Output from previous day: 03/01 0701 - 03/02 0700 In: 415 [P.O.:360; IV Piggyback:55] Out: 550 [Urine:550] Intake/Output this shift: No intake/output data recorded.  WDWN in NAD Abd - soft, minimal tenderness Incision c/d/i  Lab Results:  Recent Labs    12/18/22 0536 12/19/22 0536  WBC 6.8 6.8  HGB 9.2* 9.1*  HCT 30.2* 29.9*  PLT 238 222   BMET Recent Labs    12/18/22 0536 12/19/22 0536  NA 135 138  K 3.5 3.7  CL 108 110  CO2 20* 21*  GLUCOSE 95 93  BUN 28* 21  CREATININE 1.35* 1.09  CALCIUM 7.4* 7.6*   PT/INR No results for input(s): "LABPROT", "INR" in the last 72 hours. ABG No results for input(s): "PHART", "HCO3" in the last 72 hours.  Invalid input(s): "PCO2", "PO2"  Studies/Results: DG Ribs Bilateral  Result Date: 12/18/2022 CLINICAL DATA:  Fall EXAM: BILATERAL RIBS - 3+ VIEW COMPARISON:  None Available. FINDINGS: No fracture or other bone lesions are seen involving the ribs. IMPRESSION: Negative. Electronically Signed   By: Margaretha Sheffield M.D.   On: 12/18/2022 08:20   DG Forearm Left  Result Date: 12/18/2022 CLINICAL DATA:  Unwitnessed fall.  Left hand swelling and bruising EXAM: LEFT FOREARM - 2 VIEW COMPARISON:  None Available. FINDINGS: No acute fracture or dislocation. Generalized osteopenia. Metallic foreign body measuring 2 mm at the medial forearm, age indeterminate. Atheromatous calcification. IMPRESSION: 1. No  acute fracture dislocation. 2. 2 mm metallic foreign body at the medial forearm. Electronically Signed   By: Jorje Guild M.D.   On: 12/18/2022 08:20   DG Hand 2 View Left  Result Date: 12/18/2022 CLINICAL DATA:  Unwitnessed fall with left hand swelling. EXAM: LEFT HAND - 2 VIEW COMPARISON:  None Available. FINDINGS: No acute fracture or dislocation. MCP and interphalangeal osteoarthritis with erosive changes at the D IP joint of the index, middle, and little fingers. Subjective osteopenia. IMPRESSION: 1. No acute finding. 2. Erosive osteoarthritis. Electronically Signed   By: Jorje Guild M.D.   On: 12/18/2022 08:19    Anti-infectives: Anti-infectives (From admission, onward)    Start     Dose/Rate Route Frequency Ordered Stop   12/15/22 0915  cefoTEtan (CEFOTAN) 2 g in sodium chloride 0.9 % 100 mL IVPB        2 g 200 mL/hr over 30 Minutes Intravenous On call to O.R. 12/15/22 0906 12/15/22 1123   12/15/22 0909  sodium chloride 0.9 % with cefoTEtan (CEFOTAN) ADS Med       Note to Pharmacy: [name] B: cabinet override      12/15/22 0909 12/15/22 1122       Assessment/Plan: SBO, recurrent, secondary to adhesive disease Right colon adenocarcinoma POD4 s/p laparoscopic converted to open right hemicolectomy and lysis of adhesions. - Soft diet, SLIV - AKI: Improving, remove foley - Multimodal pain control - Surgical pathology shows a pT3N0 adenocarcinoma with negative margins. I reviewed  this with the patient and his sister this morning. - Fall yesterday plain films negative, no extremity or rib fractures noted, no chest wall pain on exam today. - Mobilize, PT following - Pulmonary toilet - VTE: lovenox, SCDs. Ok to resume home Eliquis from a surgical standpoint.  Discharge planning per primary team.  May need rehab placement No acute surgical issues  LOS: 10 days    Maia Petties 12/19/2022

## 2022-12-19 NOTE — Progress Notes (Signed)
PROGRESS NOTE    Ian Estes  [id number] DOB: [birth year] DOA: 12/09/2022 PCP: Aletha Halim., PA-C     Brief Narrative:  Ian Estes is a 82 y.o. male with medical history significant of CKD stage III, A-fib on Eliquis, hypertension, hyperlipidemia who presents with abdominal pain and nausea. He was admitted from 1/7 to 1/12 secondary to acute sigmoid diverticulitis with contained perforation/abscess. He was also admitted again 1/21 to 1/25 secondary to recurrent diverticulitis. Admitted again 2/14 to 2/16 secondary to small bowel obstruction.  At that time, his small bowel obstruction resolved with conservative measures.  He was recommended for outpatient colonoscopy. Pt returned with recurrent abdominal pain, found to have recurrent SBO. General surgery was consulted. Due to hematochezia, GI consulted.  Patient underwent colonoscopy by Dr. Benson Norway 2/23.  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 Pt underwent open R hemicolectomy on 12/15/22 with lysis of adhesion.   New events last 24 hours / Subjective: Today, met pt sitting on chair, wanting to get out. Reports some post op abd pain. Refused PT today    Assessment & Plan:  Principal Problem:   SBO (small bowel obstruction) (HCC) Active Problems:   Chronic atrial fibrillation (HCC)   Chronic kidney disease, stage 3a (HCC)   Hypertension   Colonic mass   Hyperkalemia   Recurrent small bowel obstruction, recent history of diverticulitis R colon adenocarcinoma S/p laparoscopic converted to open right hemicolectomy and lysis of adhesions on 12/15/2022 Status post colonoscopy 2/23, by Dr. Benson Norway Pathology revealed invasive moderately differentiated adenocarcinoma CEA 4.9 General surgery on board, status post right hemicolectomy- advanced diet Surgical pathology confirmed colon ca, with clear margins Primary attending  Dr. Maylene Roes, discussed with Dr. Chryl Heck, oncology, rec Outpatient referral to hematology/oncology placed  AKI Cr bumped to 2.23, trending down nicely D/C IV fluids Daily BMP  Hyponatremia S/P IV fluids Daily BMP   Chronic A-fib Resume metoprolol, resumed Eliquis on 3/2  Mechanical fall Denies hitting head or passing out Plain film images without any fracture or any acute issues Fall precautions PT/OT    DVT prophylaxis:  SCDs Start: 12/09/22 1744 apixaban (ELIQUIS) tablet 5 mg  Code Status: DNR Family Communication: None at bedside  Disposition Plan:  Status is: Inpatient Remains inpatient appropriate because: Level of care  Consultants:  General surgery GI  Antimicrobials:  Anti-infectives (From admission, onward)    Start     Dose/Rate Route Frequency Ordered Stop   12/15/22 0915  cefoTEtan (CEFOTAN) 2 g in sodium chloride 0.9 % 100 mL IVPB        2 g 200 mL/hr over 30 Minutes Intravenous On call to O.R. 12/15/22 0906 12/15/22 1123   12/15/22 0909  sodium chloride 0.9 % with cefoTEtan (CEFOTAN) ADS Med       Note to Pharmacy: [name] B: cabinet override      12/15/22 0909 12/15/22 1122        Objective: Vitals:   12/18/22 1940 12/19/22 0419 12/19/22 0834 12/19/22 1249  BP: 105/71 102/67 114/67 109/66  Pulse: 78 79 70 81  Resp: '19 19  20  '$ Temp: 97.8 F (36.6 C) (!) 97.4 F (36.3 C)  98.3 F (36.8 C)  TempSrc: Oral Oral  Oral  SpO2: 98% 97%  99%  Weight:      Height:        Intake/Output Summary (Last 24 hours) at 12/19/2022 1712 Last data filed at 12/19/2022 1612  Gross per 24 hour  Intake 360 ml  Output 550 ml  Net -190 ml   Filed Weights   12/09/22 1415 12/15/22 0807  Weight: 89.4 kg 89.4 kg    Examination:  General: NAD Cardiovascular: S1, S2 present Respiratory: CTAB Abdomen: Soft, tender, nondistended, no bowel sounds, dressing intact Musculoskeletal: No bilateral pedal edema noted Skin: Normal Psychiatry: Normal mood    Data  Reviewed: I have personally reviewed following labs and imaging studies  CBC: Recent Labs  Lab 12/14/22 0404 12/16/22 0541 12/17/22 0759 12/18/22 0536 12/19/22 0536  WBC 6.5 7.8 8.8 6.8 6.8  NEUTROABS  --   --  6.6 4.8 4.8  HGB 9.8* 11.7* 10.4* 9.2* 9.1*  HCT 32.6* 40.0 34.2* 30.2* 29.9*  MCV 86.0 90.1 85.5 85.6 84.9  PLT 227 324 316 238 [id number]   Basic Metabolic Panel: Recent Labs  Lab 12/14/22 0404 12/16/22 0541 12/17/22 0759 12/18/22 0536 12/19/22 0536  NA 133* 132* 136 135 138  K 4.0 4.8 4.1 3.5 3.7  CL 103 101 106 108 110  CO2 22 18* 21* 20* 21*  GLUCOSE 88 122* 101* 95 93  BUN 10 27* 36* 28* 21  CREATININE 1.05 2.23* 2.06* 1.35* 1.09  CALCIUM 8.0* 7.8* 7.9* 7.4* 7.6*   GFR: Estimated Creatinine Clearance: 57.3 mL/min (by C-G formula based on SCr of 1.09 mg/dL). Liver Function Tests: No results for input(s): "AST", "ALT", "ALKPHOS", "BILITOT", "PROT", "ALBUMIN" in the last 168 hours.  No results for input(s): "LIPASE", "AMYLASE" in the last 168 hours. No results for input(s): "AMMONIA" in the last 168 hours. Coagulation Profile: No results for input(s): "INR", "PROTIME" in the last 168 hours. Cardiac Enzymes: No results for input(s): "CKTOTAL", "CKMB", "CKMBINDEX", "TROPONINI" in the last 168 hours. BNP (last 3 results) No results for input(s): "PROBNP" in the last 8760 hours. HbA1C: No results for input(s): "HGBA1C" in the last 72 hours. CBG: Recent Labs  Lab 12/15/22 0636 12/15/22 1348  GLUCAP 82 105*   Lipid Profile: No results for input(s): "CHOL", "HDL", "LDLCALC", "TRIG", "CHOLHDL", "LDLDIRECT" in the last 72 hours. Thyroid Function Tests: No results for input(s): "TSH", "T4TOTAL", "FREET4", "T3FREE", "THYROIDAB" in the last 72 hours. Anemia Panel: No results for input(s): "VITAMINB12", "FOLATE", "FERRITIN", "TIBC", "IRON", "RETICCTPCT" in the last 72 hours. Sepsis Labs: No results for input(s): "PROCALCITON", "LATICACIDVEN" in the last 168  hours.  No results found for this or any previous visit (from the past 240 hour(s)).    Radiology Studies: DG Ribs Bilateral  Result Date: 12/18/2022 CLINICAL DATA:  Fall EXAM: BILATERAL RIBS - 3+ VIEW COMPARISON:  None Available. FINDINGS: No fracture or other bone lesions are seen involving the ribs. IMPRESSION: Negative. Electronically Signed   By: Margaretha Sheffield M.D.   On: 12/18/2022 08:20   DG Forearm Left  Result Date: 12/18/2022 CLINICAL DATA:  Unwitnessed fall.  Left hand swelling and bruising EXAM: LEFT FOREARM - 2 VIEW COMPARISON:  None Available. FINDINGS: No acute fracture or dislocation. Generalized osteopenia. Metallic foreign body measuring 2 mm at the medial forearm, age indeterminate. Atheromatous calcification. IMPRESSION: 1. No acute fracture dislocation. 2. 2 mm metallic foreign body at the medial forearm. Electronically Signed   By: Jorje Guild M.D.   On: 12/18/2022 08:20   DG Hand 2 View Left  Result Date: 12/18/2022 CLINICAL DATA:  Unwitnessed fall with left hand swelling. EXAM: LEFT HAND - 2 VIEW COMPARISON:  None Available. FINDINGS: No acute fracture or dislocation. MCP and interphalangeal osteoarthritis with erosive  changes at the D IP joint of the index, middle, and little fingers. Subjective osteopenia. IMPRESSION: 1. No acute finding. 2. Erosive osteoarthritis. Electronically Signed   By: Jorje Guild M.D.   On: 12/18/2022 08:19      Scheduled Meds:  acetaminophen  650 mg Oral QID   apixaban  5 mg Oral BID   lidocaine  1 patch Transdermal Q24H   metoprolol succinate  12.5 mg Oral Daily   sodium chloride flush  3 mL Intravenous Q12H   Continuous Infusions:  methocarbamol (ROBAXIN) IV 500 mg (12/18/22 1042)   promethazine (PHENERGAN) injection (IM or IVPB) 12.5 mg (12/14/22 1532)      LOS: 10 days     Alma Friendly, MD [facility] 12/19/2022, 5:12 PM   Available via Epic secure chat 7am-7pm After these hours, please refer to  coverage provider listed on [email or web address]

## 2022-12-20 DIAGNOSIS — K56609 Unspecified intestinal obstruction, unspecified as to partial versus complete obstruction: Secondary | ICD-10-CM | POA: Diagnosis not present

## 2022-12-20 DIAGNOSIS — K6389 Other specified diseases of intestine: Secondary | ICD-10-CM | POA: Diagnosis not present

## 2022-12-20 DIAGNOSIS — I10 Essential (primary) hypertension: Secondary | ICD-10-CM | POA: Diagnosis not present

## 2022-12-20 LAB — CBC WITH DIFFERENTIAL/PLATELET
Abs Immature Granulocytes: 0.06 10*3/uL (ref 0.00–0.07)
Basophils Absolute: 0 10*3/uL (ref 0.0–0.1)
Basophils Relative: 0 %
Eosinophils Absolute: 0.3 10*3/uL (ref 0.0–0.5)
Eosinophils Relative: 3 %
HCT: 29.7 % — ABNORMAL LOW (ref 39.0–52.0)
Hemoglobin: 9.2 g/dL — ABNORMAL LOW (ref 13.0–17.0)
Immature Granulocytes: 1 %
Lymphocytes Relative: 13 %
Lymphs Abs: 1.1 10*3/uL (ref 0.7–4.0)
MCH: 26.5 pg (ref 26.0–34.0)
MCHC: 31 g/dL (ref 30.0–36.0)
MCV: 85.6 fL (ref 80.0–100.0)
Monocytes Absolute: 1 10*3/uL (ref 0.1–1.0)
Monocytes Relative: 11 %
Neutro Abs: 6.6 10*3/uL (ref 1.7–7.7)
Neutrophils Relative %: 72 %
Platelets: 233 10*3/uL (ref 150–400)
RBC: 3.47 MIL/uL — ABNORMAL LOW (ref 4.22–5.81)
RDW: 16.4 % — ABNORMAL HIGH (ref 11.5–15.5)
WBC: 9 10*3/uL (ref 4.0–10.5)
nRBC: 0 % (ref 0.0–0.2)

## 2022-12-20 LAB — BASIC METABOLIC PANEL
Anion gap: 6 (ref 5–15)
BUN: 18 mg/dL (ref 8–23)
CO2: 19 mmol/L — ABNORMAL LOW (ref 22–32)
Calcium: 7.3 mg/dL — ABNORMAL LOW (ref 8.9–10.3)
Chloride: 110 mmol/L (ref 98–111)
Creatinine, Ser: 1.05 mg/dL (ref 0.61–1.24)
GFR, Estimated: >60 mL/min (ref >60)
Glucose, Bld: 125 mg/dL — ABNORMAL HIGH (ref 70–99)
Potassium: 3.7 mmol/L (ref 3.5–5.1)
Sodium: 135 mmol/L (ref 135–145)

## 2022-12-20 NOTE — Progress Notes (Signed)
Mobility Specialist - Progress Note   12/20/22 1506  Mobility  Activity Ambulated with assistance in hallway  Level of Assistance Standby assist, set-up cues, supervision of patient - no hands on  Assistive Device Front wheel walker  Distance Ambulated (ft) 100 ft  Range of Motion/Exercises Active  Activity Response Tolerated well  $Mobility charge 1 Mobility   Pt was found in bed and agreeable to ambulate with encouragement from RN. Pt able to complete bed mobility with supervision and ambulation. Required cues for body placement when using the RW. Upon returning to room stated wanting to use bathroom and completed bathroom ambulation without RW. Pt was told to pull call bell string in bathroom when finished and verbalized understanding. RN notified.  Ferd Hibbs Mobility Specialist

## 2022-12-20 NOTE — Progress Notes (Signed)
5 Days Post-Op   Subjective/Chief Complaint: Tolerating diet Flatus, no BM OT is recommending short term rehab Pain with movement   Objective: Vital signs in last 24 hours: Temp:  [97.6 F (36.4 C)-98.5 F (36.9 C)] 97.6 F (36.4 C) (03/03 0520) Pulse Rate:  [70-86] 86 (03/03 0520) Resp:  [16-20] 16 (03/03 0520) BP: (104-117)/(62-67) 104/62 (03/03 0520) SpO2:  [98 %-99 %] 98 % (03/03 0520) Last BM Date : 12/18/22 (per patient)  Intake/Output from previous day: 03/02 0701 - 03/03 0700 In: 600 [P.O.:600] Out: 450 [Urine:450] Intake/Output this shift: Total I/O In: 120 [P.O.:120] Out: -   WDWN in NAD Abd - soft, minimal tenderness Incision c/d/i  Lab Results:  Recent Labs    12/19/22 0536 12/20/22 0453  WBC 6.8 9.0  HGB 9.1* 9.2*  HCT 29.9* 29.7*  PLT 222 233   BMET Recent Labs    12/19/22 0536 12/20/22 0453  NA 138 135  K 3.7 3.7  CL 110 110  CO2 21* 19*  GLUCOSE 93 125*  BUN 21 18  CREATININE 1.09 1.05  CALCIUM 7.6* 7.3*   PT/INR No results for input(s): "LABPROT", "INR" in the last 72 hours. ABG No results for input(s): "PHART", "HCO3" in the last 72 hours.  Invalid input(s): "PCO2", "PO2"  Studies/Results: No results found.  Anti-infectives: Anti-infectives (From admission, onward)    Start     Dose/Rate Route Frequency Ordered Stop   12/15/22 0915  cefoTEtan (CEFOTAN) 2 g in sodium chloride 0.9 % 100 mL IVPB        2 g 200 mL/hr over 30 Minutes Intravenous On call to O.R. 12/15/22 0906 12/15/22 1123   12/15/22 0909  sodium chloride 0.9 % with cefoTEtan (CEFOTAN) ADS Med       Note to Pharmacy: [name] B: cabinet override      12/15/22 0909 12/15/22 1122       Assessment/Plan: SBO, recurrent, secondary to adhesive disease Right colon adenocarcinoma s/p laparoscopic converted to open right hemicolectomy and lysis of adhesions 12/15/22 Dr. Zenia Resides - [place], SLIV - AKI: Improving, - Multimodal pain control - Surgical  pathology shows a pT3N0 adenocarcinoma with negative margins. I reviewed this with the patient and his sister  - Fall Thursday plain films negative, no extremity or rib fractures noted, no chest wall pain on exam today. - Mobilize, PT following - Pulmonary toilet - VTE: lovenox, SCDs. Ok to resume home Eliquis from a surgical standpoint.   Discharge planning per primary team.  Needs rehab placement per OT recommendation No acute surgical issues  LOS: 11 days    Maia Petties 12/20/2022

## 2022-12-20 NOTE — Progress Notes (Signed)
PROGRESS NOTE    Ian Estes  [id number] DOB: 03/20/1941 DOA: 12/09/2022 PCP: Aletha Halim., PA-C     Brief Narrative:  Ian Estes is a 82 y.o. male with medical history significant of CKD stage III, A-fib on Eliquis, hypertension, hyperlipidemia who presents with abdominal pain and nausea. He was admitted from 1/7 to 1/12 secondary to acute sigmoid diverticulitis with contained perforation/abscess. He was also admitted again 1/21 to 1/25 secondary to recurrent diverticulitis. Admitted again 2/14 to 2/16 secondary to small bowel obstruction.  At that time, his small bowel obstruction resolved with conservative measures.  He was recommended for outpatient colonoscopy. Pt returned with recurrent abdominal pain, found to have recurrent SBO. General surgery was consulted. Due to hematochezia, GI consulted.  Patient underwent colonoscopy by Dr. Benson Norway 2/23.  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 Pt underwent open R hemicolectomy on 12/15/22 with lysis of adhesion.   New events last 24 hours / Subjective: Today, pt still reporting abd discomfort, encouraged pt to ambulate. In agreement with possible snf discharge.    Assessment & Plan:  Principal Problem:   SBO (small bowel obstruction) (HCC) Active Problems:   Chronic atrial fibrillation (HCC)   Chronic kidney disease, stage 3a (HCC)   Hypertension   Colonic mass   Hyperkalemia   Recurrent small bowel obstruction, recent history of diverticulitis R colon adenocarcinoma S/p laparoscopic converted to open right hemicolectomy and lysis of adhesions on 12/15/2022 Status post colonoscopy 2/23, by Dr. Benson Norway Pathology revealed invasive moderately differentiated adenocarcinoma CEA 4.9 General surgery on board, status post right hemicolectomy- advanced diet Surgical pathology confirmed colon ca, with clear  margins Primary attending Dr. Maylene Roes, discussed with Dr. Chryl Heck, oncology, rec Outpatient referral to hematology/oncology placed  AKI Cr bumped to 2.23, trending down to normal D/C IV fluids  Hyponatremia S/P IV fluids   Chronic A-fib Resume metoprolol, resumed Eliquis on 3/2  Mechanical fall Denies hitting head or passing out Plain film images without any fracture or any acute issues Fall precautions PT/OT    DVT prophylaxis:  SCDs Start: 12/09/22 1744 apixaban (ELIQUIS) tablet 5 mg  Code Status: DNR Family Communication: None at bedside  Disposition Plan:  Status is: Inpatient Remains inpatient appropriate because: Level of care  Consultants:  General surgery GI  Antimicrobials:  Anti-infectives (From admission, onward)    Start     Dose/Rate Route Frequency Ordered Stop   12/15/22 0915  cefoTEtan (CEFOTAN) 2 g in sodium chloride 0.9 % 100 mL IVPB        2 g 200 mL/hr over 30 Minutes Intravenous On call to O.R. 12/15/22 0906 12/15/22 1123   12/15/22 0909  sodium chloride 0.9 % with cefoTEtan (CEFOTAN) ADS Med       Note to Pharmacy: [name] B: cabinet override      12/15/22 0909 12/15/22 1122        Objective: Vitals:   12/19/22 1249 12/19/22 2049 12/20/22 0520 12/20/22 1306  BP: 109/66 117/66 104/62 115/60  Pulse: 81 86 86 82  Resp: '20 16 16 16  '$ Temp: 98.3 F (36.8 C) 98.5 F (36.9 C) 97.6 F (36.4 C) 97.6 F (36.4 C)  TempSrc: Oral Oral Oral Oral  SpO2: 99% 99% 98% 99%  Weight:      Height:        Intake/Output Summary (Last 24 hours) at 12/20/2022 1540 Last data filed at 12/20/2022 1129 Gross per 24 hour  Intake 360 ml  Output 500 ml  Net -140 ml   Filed Weights   12/09/22 1415 12/15/22 0807  Weight: 89.4 kg 89.4 kg    Examination:  General: NAD Cardiovascular: S1, S2 present Respiratory: CTAB Abdomen: Soft, tender, nondistended, no bowel sounds, dressing intact Musculoskeletal: No bilateral pedal edema noted Skin:  Normal Psychiatry: Normal mood    Data Reviewed: I have personally reviewed following labs and imaging studies  CBC: Recent Labs  Lab 12/16/22 0541 12/17/22 0759 12/18/22 0536 12/19/22 0536 12/20/22 0453  WBC 7.8 8.8 6.8 6.8 9.0  NEUTROABS  --  6.6 4.8 4.8 6.6  HGB 11.7* 10.4* 9.2* 9.1* 9.2*  HCT 40.0 34.2* 30.2* 29.9* 29.7*  MCV 90.1 85.5 85.6 84.9 85.6  PLT 324 316 238 222 0000000   Basic Metabolic Panel: Recent Labs  Lab 12/16/22 0541 12/17/22 0759 12/18/22 0536 12/19/22 0536 12/20/22 0453  NA 132* 136 135 138 135  K 4.8 4.1 3.5 3.7 3.7  CL 101 106 108 110 110  CO2 18* 21* 20* 21* 19*  GLUCOSE 122* 101* 95 93 125*  BUN 27* 36* 28* 21 18  CREATININE 2.23* 2.06* 1.35* 1.09 1.05  CALCIUM 7.8* 7.9* 7.4* 7.6* 7.3*   GFR: Estimated Creatinine Clearance: 59.5 mL/min (by C-G formula based on SCr of 1.05 mg/dL). Liver Function Tests: No results for input(s): "AST", "ALT", "ALKPHOS", "BILITOT", "PROT", "ALBUMIN" in the last 168 hours.  No results for input(s): "LIPASE", "AMYLASE" in the last 168 hours. No results for input(s): "AMMONIA" in the last 168 hours. Coagulation Profile: No results for input(s): "INR", "PROTIME" in the last 168 hours. Cardiac Enzymes: No results for input(s): "CKTOTAL", "CKMB", "CKMBINDEX", "TROPONINI" in the last 168 hours. BNP (last 3 results) No results for input(s): "PROBNP" in the last 8760 hours. HbA1C: No results for input(s): "HGBA1C" in the last 72 hours. CBG: Recent Labs  Lab 12/15/22 0636 12/15/22 1348  GLUCAP 82 105*   Lipid Profile: No results for input(s): "CHOL", "HDL", "LDLCALC", "TRIG", "CHOLHDL", "LDLDIRECT" in the last 72 hours. Thyroid Function Tests: No results for input(s): "TSH", "T4TOTAL", "FREET4", "T3FREE", "THYROIDAB" in the last 72 hours. Anemia Panel: No results for input(s): "VITAMINB12", "FOLATE", "FERRITIN", "TIBC", "IRON", "RETICCTPCT" in the last 72 hours. Sepsis Labs: No results for input(s):  "PROCALCITON", "LATICACIDVEN" in the last 168 hours.  No results found for this or any previous visit (from the past 240 hour(s)).    Radiology Studies: No results found.    Scheduled Meds:  acetaminophen  650 mg Oral QID   apixaban  5 mg Oral BID   lidocaine  1 patch Transdermal Q24H   metoprolol succinate  12.5 mg Oral Daily   sodium chloride flush  3 mL Intravenous Q12H   Continuous Infusions:  methocarbamol (ROBAXIN) IV 500 mg (12/18/22 1042)   promethazine (PHENERGAN) injection (IM or IVPB) 12.5 mg (12/14/22 1532)      LOS: 11 days     Alma Friendly, MD [facility] 12/20/2022, 3:40 PM   Available via Epic secure chat 7am-7pm After these hours, please refer to coverage provider listed on [email or web address]

## 2022-12-21 DIAGNOSIS — K6389 Other specified diseases of intestine: Secondary | ICD-10-CM | POA: Diagnosis not present

## 2022-12-21 DIAGNOSIS — I10 Essential (primary) hypertension: Secondary | ICD-10-CM | POA: Diagnosis not present

## 2022-12-21 DIAGNOSIS — K56609 Unspecified intestinal obstruction, unspecified as to partial versus complete obstruction: Secondary | ICD-10-CM | POA: Diagnosis not present

## 2022-12-21 LAB — CBC WITH DIFFERENTIAL/PLATELET
Abs Immature Granulocytes: 0.11 10*3/uL — ABNORMAL HIGH (ref 0.00–0.07)
Basophils Absolute: 0 10*3/uL (ref 0.0–0.1)
Basophils Relative: 0 %
Eosinophils Absolute: 0.3 10*3/uL (ref 0.0–0.5)
Eosinophils Relative: 4 %
HCT: 32.6 % — ABNORMAL LOW (ref 39.0–52.0)
Hemoglobin: 10.2 g/dL — ABNORMAL LOW (ref 13.0–17.0)
Immature Granulocytes: 1 %
Lymphocytes Relative: 14 %
Lymphs Abs: 1.3 10*3/uL (ref 0.7–4.0)
MCH: 26.4 pg (ref 26.0–34.0)
MCHC: 31.3 g/dL (ref 30.0–36.0)
MCV: 84.2 fL (ref 80.0–100.0)
Monocytes Absolute: 1.1 10*3/uL — ABNORMAL HIGH (ref 0.1–1.0)
Monocytes Relative: 13 %
Neutro Abs: 6.1 10*3/uL (ref 1.7–7.7)
Neutrophils Relative %: 68 %
Platelets: 249 10*3/uL (ref 150–400)
RBC: 3.87 MIL/uL — ABNORMAL LOW (ref 4.22–5.81)
RDW: 16.3 % — ABNORMAL HIGH (ref 11.5–15.5)
WBC: 8.9 10*3/uL (ref 4.0–10.5)
nRBC: 0 % (ref 0.0–0.2)

## 2022-12-21 LAB — BASIC METABOLIC PANEL
Anion gap: 6 (ref 5–15)
BUN: 14 mg/dL (ref 8–23)
CO2: 22 mmol/L (ref 22–32)
Calcium: 7.9 mg/dL — ABNORMAL LOW (ref 8.9–10.3)
Chloride: 107 mmol/L (ref 98–111)
Creatinine, Ser: 0.95 mg/dL (ref 0.61–1.24)
GFR, Estimated: >60 mL/min (ref >60)
Glucose, Bld: 109 mg/dL — ABNORMAL HIGH (ref 70–99)
Potassium: 4.3 mmol/L (ref 3.5–5.1)
Sodium: 135 mmol/L (ref 135–145)

## 2022-12-21 MED ORDER — DOCUSATE SODIUM 100 MG PO CAPS
100.0000 mg | ORAL_CAPSULE | Freq: Two times a day (BID) | ORAL | Status: DC
  Administered 2022-12-21 (×2): 100 mg via ORAL
  Filled 2022-12-21 (×2): qty 1

## 2022-12-21 MED ORDER — POLYETHYLENE GLYCOL 3350 17 G PO PACK
17.0000 g | PACK | Freq: Every day | ORAL | Status: DC
  Administered 2022-12-21 – 2022-12-24 (×4): 17 g via ORAL
  Filled 2022-12-21 (×4): qty 1

## 2022-12-21 NOTE — Progress Notes (Signed)
6 Days Post-Op   Subjective/Chief Complaint: Tolerating diet Flatus, no BM in a few days Pain with movement he describes as soreness not really pain   Objective: Vital signs in last 24 hours: Temp:  [97.6 F (36.4 C)-97.9 F (36.6 C)] 97.9 F (36.6 C) (03/04 0606) Pulse Rate:  [68-82] 76 (03/04 0606) Resp:  [16-20] 16 (03/04 0606) BP: (110-115)/(60-71) 110/71 (03/04 0606) SpO2:  [99 %] 99 % (03/04 0606) Last BM Date : 12/18/22  Intake/Output from previous day: 03/03 0701 - 03/04 0700 In: 360 [P.O.:360] Out: 800 [Urine:800] Intake/Output this shift: Total I/O In: 240 [P.O.:240] Out: -   WDWN in NAD Abd - soft, minimal tenderness Incision c/d/i  Lab Results:  Recent Labs    12/20/22 0453 12/21/22 0443  WBC 9.0 8.9  HGB 9.2* 10.2*  HCT 29.7* 32.6*  PLT 233 249   BMET Recent Labs    12/20/22 0453 12/21/22 0443  NA 135 135  K 3.7 4.3  CL 110 107  CO2 19* 22  GLUCOSE 125* 109*  BUN 18 14  CREATININE 1.05 0.95  CALCIUM 7.3* 7.9*   PT/INR No results for input(s): "LABPROT", "INR" in the last 72 hours. ABG No results for input(s): "PHART", "HCO3" in the last 72 hours.  Invalid input(s): "PCO2", "PO2"  Studies/Results: No results found.  Anti-infectives: Anti-infectives (From admission, onward)    Start     Dose/Rate Route Frequency Ordered Stop   12/15/22 0915  cefoTEtan (CEFOTAN) 2 g in sodium chloride 0.9 % 100 mL IVPB        2 g 200 mL/hr over 30 Minutes Intravenous On call to O.R. 12/15/22 0906 12/15/22 1123   12/15/22 0909  sodium chloride 0.9 % with cefoTEtan (CEFOTAN) ADS Med       Note to Pharmacy: [name] B: cabinet override      12/15/22 0909 12/15/22 1122       Assessment/Plan: SBO, recurrent, secondary to adhesive disease Right colon adenocarcinoma s/p laparoscopic converted to open right hemicolectomy and lysis of adhesions 12/15/22 Dr. Zenia Resides - Soft diet - AKI: Improving, - Multimodal pain control - Surgical pathology  shows a pT3N0 adenocarcinoma with negative margins. - Fall Thursday plain films negative, no extremity or rib fractures noted, no chest wall pain on exam today. - Mobilize, PT following - Pulmonary toilet - VTE: lovenox, SCDs. Ok to resume home Eliquis from a surgical standpoint.   Discharge planning per primary team.  Needs rehab placement per OT recommendation No acute surgical issues, table for discharge from [facility] standpoint  LOS: 12 days    Jill Alexanders 12/21/2022

## 2022-12-21 NOTE — Progress Notes (Signed)
Physical Therapy Treatment Patient Details Name: Ian Estes MRN: [id number] DOB: [birth year] Today's Date: 12/21/2022   History of Present Illness Patient is a 82 year old male who presented on 2/21 with recurrent abdominal pain. Patient underwent colonoscopy on 2/23 that revealed malignant tumor in ascending colon. 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 on 12/15/22 ,Pt underwent open R hemicolectomy with lysis of adhesion.of note patient reporting having a fall at this hospital on 3/1 with imaging negative for acute injury, [id number] from 1/7 to 1/12 secondary to acute sigmoid diverticulitis with contained perforation/abscess. He was also admitted again 1/21 to 1/25 secondary to recurrent diverticulitis. Admitted again 2/14 to 2/16 secondary to small bowel obstruction.    PT Comments    Pt agreeable to working with therapy. He rated pain 6/10 during session. He continues to report feeling weak. Bed mobility remains a challenge. Discussed d/c plan on today-he is agreeable to short term rehab stay since he will not have 24/7 supervision/assist available at home. Will update PT recommendation on today. Will continue to progress activity as tolerated.     Recommendations for follow up therapy are one component of a multi-disciplinary discharge planning process, led by the attending physician.  Recommendations may be updated based on patient status, additional functional criteria and insurance authorization.  Follow Up Recommendations  Skilled nursing-short term rehab (<3 hours/day) Can patient physically be transported by private vehicle: Yes   Assistance Recommended at Discharge Frequent or constant Supervision/Assistance  Patient can return home with the following A little help with walking and/or transfers;A little help with bathing/dressing/bathroom;Assistance with cooking/housework;Assist for transportation;Help with stairs or ramp for  entrance   Equipment Recommendations  Rolling walker (2 wheels)    Recommendations for Other Services       Precautions / Restrictions Precautions Precautions: Fall Precaution Comments: abd surgery Restrictions Weight Bearing Restrictions: No     Mobility  Bed Mobility Overal bed mobility: Needs Assistance Bed Mobility: Supine to Sit, Sit to Supine     Supine to sit: Min guard, HOB elevated Sit to supine: Mod assist, HOB elevated   General bed mobility comments: Assist for LEs back onto bed. Increased time. Pt required use of bedrail.    Transfers Overall transfer level: Needs assistance Equipment used: Rolling walker (2 wheels) Transfers: Sit to/from Stand Sit to Stand: Min guard, From elevated surface           General transfer comment: Close Min guard A. Cues for safety, technique, hand placement. Increased time.    Ambulation/Gait Ambulation/Gait assistance: Min guard Gait Distance (Feet): 55 Feet Assistive device: Rolling walker (2 wheels) Gait Pattern/deviations: Step-through pattern, Decreased stride length       General Gait Details: Cues for safety, posture, RW proximity. Pt continues to report feeling weak. Tolerated distance fairly well.   Stairs             Wheelchair Mobility    Modified Rankin (Stroke Patients Only)       Balance Overall balance assessment: Needs assistance         Standing balance support: Bilateral upper extremity supported, During functional activity, Reliant on assistive device for balance Standing balance-Leahy Scale: Poor                              Cognition Arousal/Alertness: Awake/alert Behavior During Therapy: WFL for tasks assessed/performed Overall Cognitive Status: Within Functional Limits for tasks assessed  Exercises      General Comments        Pertinent Vitals/Pain Pain Assessment Pain Assessment: 0-10 Pain  Score: 6  Pain Location: abdomen Pain Descriptors / Indicators: Operative site guarding, Discomfort, Sore Pain Intervention(s): Monitored during session, Repositioned, Premedicated before session    Home Living                          Prior Function            PT Goals (current goals can now be found in the care plan section) Progress towards PT goals: Progressing toward goals    Frequency    Min 2X/week      PT Plan Discharge plan needs to be updated;Frequency needs to be updated    Co-evaluation              AM-PAC PT "6 Clicks" Mobility   Outcome Measure  Help needed turning from your back to your side while in a flat bed without using bedrails?: A Little Help needed moving from lying on your back to sitting on the side of a flat bed without using bedrails?: A Little Help needed moving to and from a bed to a chair (including a wheelchair)?: A Little Help needed standing up from a chair using your arms (e.g., wheelchair or bedside chair)?: A Little Help needed to walk in hospital room?: A Little Help needed climbing 3-5 steps with a railing? : A Lot 6 Click Score: 17    End of Session Equipment Utilized During Treatment: Gait belt Activity Tolerance: Patient tolerated treatment well Patient left: in bed;with call bell/phone within reach;with bed alarm set   PT Visit Diagnosis: Muscle weakness (generalized) (M62.81);Pain;Difficulty in walking, not elsewhere classified (R26.2)     Time: [id number] PT Time Calculation (min) (ACUTE ONLY): 14 min  Charges:  $Gait Training: 8-22 mins                         Doreatha Massed, PT Acute Rehabilitation  Office: [phone]

## 2022-12-21 NOTE — Progress Notes (Signed)
PROGRESS NOTE    Ian Estes  [id number] DOB: [birth year] DOA: 12/09/2022 PCP: Aletha Halim., PA-C     Brief Narrative:  Ian Estes is a 82 y.o. male with medical history significant of CKD stage III, A-fib on Eliquis, hypertension, hyperlipidemia who presents with abdominal pain and nausea. He was admitted from 1/7 to 1/12 secondary to acute sigmoid diverticulitis with contained perforation/abscess. He was also admitted again 1/21 to 1/25 secondary to recurrent diverticulitis. Admitted again 2/14 to 2/16 secondary to small bowel obstruction.  At that time, his small bowel obstruction resolved with conservative measures.  He was recommended for outpatient colonoscopy. Pt returned with recurrent abdominal pain, found to have recurrent SBO. General surgery was consulted. Due to hematochezia, GI consulted.  Patient underwent colonoscopy by Dr. Benson Norway 2/23.  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 Pt underwent open R hemicolectomy on 12/15/22 with lysis of adhesion.   New events last 24 hours / Subjective: Today pt still c/o post op pain. Denies any new complaints.     Assessment & Plan:  Principal Problem:   SBO (small bowel obstruction) (HCC) Active Problems:   Chronic atrial fibrillation (HCC)   Chronic kidney disease, stage 3a (HCC)   Hypertension   Colonic mass   Hyperkalemia   Recurrent small bowel obstruction, recent history of diverticulitis R colon adenocarcinoma S/p laparoscopic converted to open right hemicolectomy and lysis of adhesions on 12/15/2022 Status post colonoscopy 2/23, by Dr. Benson Norway Pathology revealed invasive moderately differentiated adenocarcinoma CEA 4.9 General surgery on board, status post right hemicolectomy- advanced diet Surgical pathology confirmed colon ca, with clear margins Primary attending Dr. Maylene Roes, discussed with Dr. Chryl Heck,  oncology, rec Outpatient referral to hematology/oncology placed  AKI Cr bumped to 2.23, trended down to normal D/C IV fluids  Hyponatremia S/P IV fluids   Chronic A-fib Resume metoprolol, resumed Eliquis on 3/2  Mechanical fall Denies hitting head or passing out Plain film images without any fracture or any acute issues Fall precautions PT/OT    DVT prophylaxis:  SCDs Start: 12/09/22 1744 apixaban (ELIQUIS) tablet 5 mg  Code Status: DNR Family Communication: None at bedside  Disposition Plan:  Status is: Inpatient Remains inpatient appropriate because: Level of care  Consultants:  General surgery GI  Antimicrobials:  Anti-infectives (From admission, onward)    Start     Dose/Rate Route Frequency Ordered Stop   12/15/22 0915  cefoTEtan (CEFOTAN) 2 g in sodium chloride 0.9 % 100 mL IVPB        2 g 200 mL/hr over 30 Minutes Intravenous On call to O.R. 12/15/22 0906 12/15/22 1123   12/15/22 0909  sodium chloride 0.9 % with cefoTEtan (CEFOTAN) ADS Med       Note to Pharmacy: [name] B: cabinet override      12/15/22 0909 12/15/22 1122        Objective: Vitals:   12/20/22 1306 12/20/22 2058 12/21/22 0606 12/21/22 1400  BP: 115/60 113/67 110/71 114/67  Pulse: 82 68 76 78  Resp: '16 20 16 20  '$ Temp: 97.6 F (36.4 C) 97.8 F (36.6 C) 97.9 F (36.6 C) 97.6 F (36.4 C)  TempSrc: Oral Oral Oral Oral  SpO2: 99% 99% 99% 98%  Weight:      Height:        Intake/Output Summary (Last 24 hours) at 12/21/2022 1532 Last data filed at 12/21/2022 1507 Gross per 24 hour  Intake 720 ml  Output 600 ml  Net 120 ml   Filed Weights   12/09/22 1415 12/15/22 0807  Weight: 89.4 kg 89.4 kg    Examination:  General: NAD Cardiovascular: S1, S2 present Respiratory: CTAB Abdomen: Soft, tender, nondistended, no bowel sounds, dressing intact Musculoskeletal: No bilateral pedal edema noted Skin: Normal Psychiatry: Normal mood    Data Reviewed: I have personally reviewed  following labs and imaging studies  CBC: Recent Labs  Lab 12/17/22 0759 12/18/22 0536 12/19/22 0536 12/20/22 0453 12/21/22 0443  WBC 8.8 6.8 6.8 9.0 8.9  NEUTROABS 6.6 4.8 4.8 6.6 6.1  HGB 10.4* 9.2* 9.1* 9.2* 10.2*  HCT 34.2* 30.2* 29.9* 29.7* 32.6*  MCV 85.5 85.6 84.9 85.6 84.2  PLT 316 238 222 233 0000000   Basic Metabolic Panel: Recent Labs  Lab 12/17/22 0759 12/18/22 0536 12/19/22 0536 12/20/22 0453 12/21/22 0443  NA 136 135 138 135 135  K 4.1 3.5 3.7 3.7 4.3  CL 106 108 110 110 107  CO2 21* 20* 21* 19* 22  GLUCOSE 101* 95 93 125* 109*  BUN 36* 28* '21 18 14  '$ CREATININE 2.06* 1.35* 1.09 1.05 0.95  CALCIUM 7.9* 7.4* 7.6* 7.3* 7.9*   GFR: Estimated Creatinine Clearance: 65.8 mL/min (by C-G formula based on SCr of 0.95 mg/dL). Liver Function Tests: No results for input(s): "AST", "ALT", "ALKPHOS", "BILITOT", "PROT", "ALBUMIN" in the last 168 hours.  No results for input(s): "LIPASE", "AMYLASE" in the last 168 hours. No results for input(s): "AMMONIA" in the last 168 hours. Coagulation Profile: No results for input(s): "INR", "PROTIME" in the last 168 hours. Cardiac Enzymes: No results for input(s): "CKTOTAL", "CKMB", "CKMBINDEX", "TROPONINI" in the last 168 hours. BNP (last 3 results) No results for input(s): "PROBNP" in the last 8760 hours. HbA1C: No results for input(s): "HGBA1C" in the last 72 hours. CBG: Recent Labs  Lab 12/15/22 0636 12/15/22 1348  GLUCAP 82 105*   Lipid Profile: No results for input(s): "CHOL", "HDL", "LDLCALC", "TRIG", "CHOLHDL", "LDLDIRECT" in the last 72 hours. Thyroid Function Tests: No results for input(s): "TSH", "T4TOTAL", "FREET4", "T3FREE", "THYROIDAB" in the last 72 hours. Anemia Panel: No results for input(s): "VITAMINB12", "FOLATE", "FERRITIN", "TIBC", "IRON", "RETICCTPCT" in the last 72 hours. Sepsis Labs: No results for input(s): "PROCALCITON", "LATICACIDVEN" in the last 168 hours.  No results found for this or any  previous visit (from the past 240 hour(s)).    Radiology Studies: No results found.    Scheduled Meds:  acetaminophen  650 mg Oral QID   apixaban  5 mg Oral BID   docusate sodium  100 mg Oral BID   lidocaine  1 patch Transdermal Q24H   metoprolol succinate  12.5 mg Oral Daily   polyethylene glycol  17 g Oral Daily   sodium chloride flush  3 mL Intravenous Q12H   Continuous Infusions:  methocarbamol (ROBAXIN) IV 500 mg (12/18/22 1042)   promethazine (PHENERGAN) injection (IM or IVPB) 12.5 mg (12/14/22 1532)      LOS: 12 days     Alma Friendly, MD [facility] 12/21/2022, 3:32 PM   Available via Epic secure chat 7am-7pm After these hours, please refer to coverage provider listed on [email or web address]

## 2022-12-21 NOTE — Care Management Important Message (Signed)
Important Message  Patient Details IM letter given. Name: Ian Estes MRN: [id number] Date of Birth: [birth year]   Medicare Important Message Given:  Yes     Kerin Salen 12/21/2022, 1:04 PM

## 2022-12-22 DIAGNOSIS — K6389 Other specified diseases of intestine: Secondary | ICD-10-CM | POA: Diagnosis not present

## 2022-12-22 DIAGNOSIS — K56609 Unspecified intestinal obstruction, unspecified as to partial versus complete obstruction: Secondary | ICD-10-CM | POA: Diagnosis not present

## 2022-12-22 DIAGNOSIS — I10 Essential (primary) hypertension: Secondary | ICD-10-CM | POA: Diagnosis not present

## 2022-12-22 MED ORDER — METHOCARBAMOL 500 MG PO TABS: 500.0000 mg | ORAL_TABLET | Freq: Four times a day (QID) | ORAL | Status: DC | PRN

## 2022-12-22 MED ORDER — DOCUSATE SODIUM 100 MG PO CAPS
100.0000 mg | ORAL_CAPSULE | Freq: Two times a day (BID) | ORAL | Status: DC
  Administered 2022-12-22 – 2022-12-24 (×5): 100 mg via ORAL
  Filled 2022-12-22 (×5): qty 1

## 2022-12-22 MED ORDER — OXYCODONE HCL 5 MG PO TABS
5.0000 mg | ORAL_TABLET | ORAL | Status: DC | PRN
  Administered 2022-12-22 – 2022-12-23 (×3): 5 mg via ORAL
  Filled 2022-12-22 (×3): qty 1

## 2022-12-22 MED ORDER — SENNOSIDES-DOCUSATE SODIUM 8.6-50 MG PO TABS
1.0000 | ORAL_TABLET | Freq: Two times a day (BID) | ORAL | Status: DC
  Administered 2022-12-22 – 2022-12-24 (×5): 1 via ORAL
  Filled 2022-12-22 (×5): qty 1

## 2022-12-22 NOTE — NC FL2 (Signed)
[place] LEVEL OF CARE FORM     IDENTIFICATION  Patient Name: Ian Estes Birthdate: August 22, 1941 Sex: male Admission Date (Current Location): 12/09/2022  [facility] and [place] Number:  Herbalist and Address:  [facility],  [place], [place]      Provider Number: [id number]  Attending Physician Name and Address:  Alma Friendly, MD  Relative Name and Phone Number:  sister, Shary Decamp @ [phone]    Current Level of Care: Hospital Recommended Level of Care: [place] Prior Approval Number:    Date Approved/Denied:   PASRR Number: [id number]  Discharge Plan: SNF    Current Diagnoses: Patient Active Problem List   Diagnosis Date Noted   Hyperkalemia 12/09/2022   Malnutrition of moderate degree 12/03/2022   Chronic anemia 12/02/2022   Hypotension 12/02/2022   SBO (small bowel obstruction) ([place]) 12/02/2022   Acute diverticulitis 11/08/2022   GERD (gastroesophageal reflux disease) 11/08/2022   Gout 11/08/2022   Anxiety 11/08/2022   Colonic mass 11/08/2022   Diverticulitis of intestine with perforation and abscess 10/25/2022   Chronic kidney disease, stage 3a ([place]) 10/25/2022   Educated about COVID-19 virus infection 06/10/2020   S/P pericardial window creation 12/21/2018   Chest pain 12/20/2018   Chronic atrial fibrillation ([place]) 12/20/2018   Pericardial effusion 12/20/2018   Chronic abdominal pain 06/13/2014   Hyperlipidemia 06/13/2014   Acute renal failure ([place]) 06/13/2014   Dehydration 06/13/2014   Diabetes mellitus without complication (HCC)    Hypertension     Orientation RESPIRATION BLADDER Height & Weight     Self, Time, Situation, Place  Normal Continent Weight: 197 lb 1.5 oz (89.4 kg) Height:  6' (182.9 cm)  BEHAVIORAL SYMPTOMS/MOOD NEUROLOGICAL BOWEL NUTRITION STATUS      Continent    AMBULATORY STATUS COMMUNICATION OF NEEDS Skin   Limited Assist Verbally Normal                        Personal Care Assistance Level of Assistance  Bathing, Dressing Bathing Assistance: Limited assistance   Dressing Assistance: Limited assistance     Functional Limitations Info  Sight, Hearing, Speech Sight Info: Adequate Hearing Info: Adequate Speech Info: Adequate    SPECIAL CARE FACTORS FREQUENCY  PT (By licensed PT), OT (By licensed OT)     PT Frequency: 5x/wk OT Frequency: 5x/wk            Contractures Contractures Info: Not present    Additional Factors Info  Code Status, Allergies Code Status Info: DNR Allergies Info: NKDA           Current Medications (12/22/2022):  This is the current hospital active medication list Current Facility-Administered Medications  Medication Dose Route Frequency Provider Last Rate Last Admin   acetaminophen (TYLENOL) tablet 650 mg  650 mg Oral QID Dwan Bolt, MD   650 mg at 12/22/22 1105   apixaban (ELIQUIS) tablet 5 mg  5 mg Oral BID Alma Friendly, MD   5 mg at 12/22/22 1106   docusate sodium (COLACE) capsule 100 mg  100 mg Oral BID Meuth, Brooke A, PA-C   100 mg at 12/22/22 1107   lidocaine (LIDODERM) 5 % 1 patch  1 patch Transdermal Q24H Jill Alexanders, PA-C   1 patch at 12/21/22 2156   methocarbamol (ROBAXIN) tablet 500 mg  500 mg Oral Q6H PRN Meuth, Brooke A, PA-C       metoprolol succinate (TOPROL-XL)  24 hr tablet 12.5 mg  12.5 mg Oral Daily Alma Friendly, MD   12.5 mg at 12/22/22 1106   metoprolol tartrate (LOPRESSOR) injection 5 mg  5 mg Intravenous Q12H PRN Alma Friendly, MD       ondansetron [facility]) injection 4 mg  4 mg Intravenous Q6H PRN Jill Alexanders, PA-C   4 mg at 12/21/22 1444   oxyCODONE (Oxy IR/ROXICODONE) immediate release tablet 5 mg  5 mg Oral Q4H PRN Meuth, Brooke A, PA-C       polyethylene glycol (MIRALAX / GLYCOLAX) packet 17 g  17 g Oral Daily Jill Alexanders, PA-C   17 g at 12/22/22 1109   promethazine (PHENERGAN) 12.5 mg in sodium chloride  0.9 % 50 mL IVPB  12.5 mg Intravenous Q6H PRN Jill Alexanders, PA-C 200 mL/hr at 12/14/22 1532 12.5 mg at 12/14/22 1532   senna-docusate (Senokot-S) tablet 1 tablet  1 tablet Oral BID Alma Friendly, MD   1 tablet at 12/22/22 1106   sodium chloride flush (NS) 0.9 % injection 3 mL  3 mL Intravenous Q12H Jill Alexanders, PA-C   3 mL at 12/22/22 1109     Discharge Medications: Please see discharge summary for a list of discharge medications.  Relevant Imaging Results:  Relevant Lab Results:   Additional Information SS# [id number]  Lennart Pall, LCSW

## 2022-12-22 NOTE — Progress Notes (Signed)
Patient ID: Sami Blunk, male   DOB: October 17, 1941, 82 y.o.   MRN: [id number] [facility] Surgery Progress Note  7 Days Post-Op  Subjective: CC-  Sitting up in bed eating breakfast. Abdomen still sore but pain well controlled on oral medications. Denies n/v. Passing flatus, BM recorded yesterday but patient does not remember having a bowel movement.  Objective: Vital signs in last 24 hours: Temp:  [97.6 F (36.4 C)-97.7 F (36.5 C)] 97.7 F (36.5 C) (03/04 2012) Pulse Rate:  [72-78] 72 (03/04 2012) Resp:  [18-20] 18 (03/04 2012) BP: (98-114)/(54-67) 98/54 (03/04 2012) SpO2:  [98 %] 98 % (03/04 2012) Last BM Date : 12/18/22  Intake/Output from previous day: 03/04 0701 - 03/05 0700 In: 480 [P.O.:480] Out: 100 [Urine:100] Intake/Output this shift: No intake/output data recorded.  PE: Gen:  Alert, NAD, pleasant Abd: soft, ND, nontender, incision cdi without erythema or drainage  Lab Results:  Recent Labs    12/20/22 0453 12/21/22 0443  WBC 9.0 8.9  HGB 9.2* 10.2*  HCT 29.7* 32.6*  PLT 233 249   BMET Recent Labs    12/20/22 0453 12/21/22 0443  NA 135 135  K 3.7 4.3  CL 110 107  CO2 19* 22  GLUCOSE 125* 109*  BUN 18 14  CREATININE 1.05 0.95  CALCIUM 7.3* 7.9*   PT/INR No results for input(s): "LABPROT", "INR" in the last 72 hours. CMP     Component Value Date/Time   NA 135 12/21/2022 0443   NA 138 01/12/2019 0852   K 4.3 12/21/2022 0443   CL 107 12/21/2022 0443   CO2 22 12/21/2022 0443   GLUCOSE 109 (H) 12/21/2022 0443   BUN 14 12/21/2022 0443   BUN 20 01/12/2019 0852   CREATININE 0.95 12/21/2022 0443   CALCIUM 7.9 (L) 12/21/2022 0443   PROT 5.8 (L) 12/10/2022 0523   ALBUMIN 2.6 (L) 12/10/2022 0523   AST 29 12/10/2022 0523   ALT 13 12/10/2022 0523   ALKPHOS 124 12/10/2022 0523   BILITOT 0.8 12/10/2022 0523   GFRNONAA >60 12/21/2022 0443   GFRAA 63 01/12/2019 0852   Lipase     Component Value Date/Time   LIPASE 36 12/09/2022 1307        Studies/Results: No results found.  Anti-infectives: Anti-infectives (From admission, onward)    Start     Dose/Rate Route Frequency Ordered Stop   12/15/22 0915  cefoTEtan (CEFOTAN) 2 g in sodium chloride 0.9 % 100 mL IVPB        2 g 200 mL/hr over 30 Minutes Intravenous On call to O.R. 12/15/22 0906 12/15/22 1123   12/15/22 0909  sodium chloride 0.9 % with cefoTEtan (CEFOTAN) ADS Med       Note to Pharmacy: [name] B: cabinet override      12/15/22 0909 12/15/22 1122        Assessment/Plan SBO, recurrent, secondary to adhesive disease Right colon adenocarcinoma POD#7 S/p laparoscopic converted to open right hemicolectomy and lysis of adhesions 12/15/22 Dr. Zenia Resides - Soft diet  - Multimodal pain control - Pulmonary toilet - Surgical pathology shows a pT3N0 adenocarcinoma with negative margins. - Mobilize, continue therapies - recommending SNF  ID: cefotetan periop VTE: eliquis FEN: soft diet   Dispo: Discharge planning per primary team.  Awaiting SNF.  No acute surgical issues, stable for discharge from CCS standpoint    LOS: 13 days    Wellington Hampshire, [facility] Surgery 12/22/2022, 8:09 AM Please see Amion for pager number  during day hours 7:00am-4:30pm

## 2022-12-22 NOTE — TOC Progression Note (Signed)
Transition of Care [facility]) - Progression Note    Patient Details  Name: Ian Estes MRN: [id number] Date of Birth: 12-Dec-1940  [facility] CM/SW Contact  Lennart Pall, LCSW Phone Number: 12/22/2022, 12:38 PM  Clinical Narrative:    Met with pt to review therapy recommendations for SNF rehab - pt is agreeable and we discussed preferred facilities.  Will begin bed search process and insurance auth once SNF confirmed.   Expected Discharge Plan: [place] Barriers to Discharge: Continued Medical Work up  Expected Discharge Plan and Services       Living arrangements for the past 2 months: Single Family Home                                       Social Determinants of Health (SDOH) Interventions SDOH Screenings   Food Insecurity: No Food Insecurity (12/09/2022)  Housing: Low Risk  (12/09/2022)  Transportation Needs: No Transportation Needs (12/09/2022)  Utilities: Not At Risk (12/09/2022)  Tobacco Use: Medium Risk (12/16/2022)    Readmission Risk Interventions    12/03/2022    1:56 PM 10/27/2022    9:24 AM  Readmission Risk Prevention Plan  Post Dischage Appt  Complete  Medication Screening  Complete  Transportation Screening Complete Complete  PCP or Specialist Appt within 5-7 Days Complete   Home Care Screening Complete   Medication Review (RN CM) Complete

## 2022-12-22 NOTE — Progress Notes (Signed)
Mobility Specialist - Progress Note   12/22/22 1529  Mobility  Activity Ambulated independently to bathroom  Level of Assistance Modified independent, requires aide device or extra time  Assistive Device None  Distance Ambulated (ft) 20 ft  Activity Response Tolerated well  Mobility Referral Yes  $Mobility charge 1 Mobility   Pt received in bed requesting assistance to bathroom for BM. Pt could not pass BM & was encouraged to ambulate to help w/ passing a BM. Pt declined hallway ambulation this afternoon & proceeded to get back in bed. No complaints during session. Pt to bed w/ all necessities in reach. NT in room.  [facility]

## 2022-12-22 NOTE — Progress Notes (Signed)
PROGRESS NOTE    Ian Estes  [id number] DOB: October 18, 1941 DOA: 12/09/2022 PCP: Aletha Halim., PA-C     Brief Narrative:  Ian Estes is a 82 y.o. male with medical history significant of CKD stage III, A-fib on Eliquis, hypertension, hyperlipidemia who presents with abdominal pain and nausea. He was admitted from 1/7 to 1/12 secondary to acute sigmoid diverticulitis with contained perforation/abscess. He was also admitted again 1/21 to 1/25 secondary to recurrent diverticulitis. Admitted again 2/14 to 2/16 secondary to small bowel obstruction.  At that time, his small bowel obstruction resolved with conservative measures.  He was recommended for outpatient colonoscopy. Pt returned with recurrent abdominal pain, found to have recurrent SBO. General surgery was consulted. Due to hematochezia, GI consulted.  Patient underwent colonoscopy by Dr. Benson Norway 2/23.  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 Pt underwent open R hemicolectomy on 12/15/22 with lysis of adhesion.   New events last 24 hours / Subjective: Today, patient denies any new complaints.    Assessment & Plan:  Principal Problem:   SBO (small bowel obstruction) (HCC) Active Problems:   Chronic atrial fibrillation (HCC)   Chronic kidney disease, stage 3a (HCC)   Hypertension   Colonic mass   Hyperkalemia   Recurrent small bowel obstruction, recent history of diverticulitis R colon adenocarcinoma S/p laparoscopic converted to open right hemicolectomy and lysis of adhesions on 12/15/2022 Status post colonoscopy 2/23, by Dr. Benson Norway Pathology revealed invasive moderately differentiated adenocarcinoma CEA 4.9 General surgery on board, status post right hemicolectomy- advanced diet Surgical pathology confirmed colon ca, with clear margins Primary attending Dr. Maylene Roes, discussed with Dr. Chryl Heck, oncology, rec  Outpatient referral to hematology/oncology placed  AKI Cr bumped to 2.23, trended down to normal D/C IV fluids  Hyponatremia S/P IV fluids   Chronic A-fib Resume metoprolol, resumed Eliquis on 3/2  Mechanical fall Denies hitting head or passing out Plain film images without any fracture or any acute issues Fall precautions PT/OT    DVT prophylaxis:  SCDs Start: 12/09/22 1744 apixaban (ELIQUIS) tablet 5 mg  Code Status: DNR Family Communication: None at bedside  Disposition Plan:  Status is: Inpatient Remains inpatient appropriate because: Level of care  Consultants:  General surgery GI  Antimicrobials:  Anti-infectives (From admission, onward)    Start     Dose/Rate Route Frequency Ordered Stop   12/15/22 0915  cefoTEtan (CEFOTAN) 2 g in sodium chloride 0.9 % 100 mL IVPB        2 g 200 mL/hr over 30 Minutes Intravenous On call to O.R. 12/15/22 0906 12/15/22 1123   12/15/22 0909  sodium chloride 0.9 % with cefoTEtan (CEFOTAN) ADS Med       Note to Pharmacy: [name] B: cabinet override      12/15/22 0909 12/15/22 1122        Objective: Vitals:   12/21/22 0606 12/21/22 1400 12/21/22 2012 12/22/22 1332  BP: 110/71 114/67 (!) 98/54 (!) 115/58  Pulse: 76 78 72 67  Resp: '16 20 18 18  '$ Temp: 97.9 F (36.6 C) 97.6 F (36.4 C) 97.7 F (36.5 C) 98.2 F (36.8 C)  TempSrc: Oral Oral Oral Oral  SpO2: 99% 98% 98% 99%  Weight:      Height:        Intake/Output Summary (Last 24 hours) at 12/22/2022 1641 Last data filed at 12/22/2022 0844 Gross per 24 hour  Intake 240 ml  Output --  Net 240 ml   Filed Weights   12/09/22 1415 12/15/22 0807  Weight: 89.4 kg 89.4 kg    Examination:  General: NAD Cardiovascular: S1, S2 present Respiratory: CTAB Abdomen: Soft, tender, nondistended, no bowel sounds, dressing intact Musculoskeletal: No bilateral pedal edema noted Skin: Normal Psychiatry: Normal mood    Data Reviewed: I have personally reviewed following  labs and imaging studies  CBC: Recent Labs  Lab 12/17/22 0759 12/18/22 0536 12/19/22 0536 12/20/22 0453 12/21/22 0443  WBC 8.8 6.8 6.8 9.0 8.9  NEUTROABS 6.6 4.8 4.8 6.6 6.1  HGB 10.4* 9.2* 9.1* 9.2* 10.2*  HCT 34.2* 30.2* 29.9* 29.7* 32.6*  MCV 85.5 85.6 84.9 85.6 84.2  PLT 316 238 222 233 0000000   Basic Metabolic Panel: Recent Labs  Lab 12/17/22 0759 12/18/22 0536 12/19/22 0536 12/20/22 0453 12/21/22 0443  NA 136 135 138 135 135  K 4.1 3.5 3.7 3.7 4.3  CL 106 108 110 110 107  CO2 21* 20* 21* 19* 22  GLUCOSE 101* 95 93 125* 109*  BUN 36* 28* '21 18 14  '$ CREATININE 2.06* 1.35* 1.09 1.05 0.95  CALCIUM 7.9* 7.4* 7.6* 7.3* 7.9*   GFR: Estimated Creatinine Clearance: 65.8 mL/min (by C-G formula based on SCr of 0.95 mg/dL). Liver Function Tests: No results for input(s): "AST", "ALT", "ALKPHOS", "BILITOT", "PROT", "ALBUMIN" in the last 168 hours.  No results for input(s): "LIPASE", "AMYLASE" in the last 168 hours. No results for input(s): "AMMONIA" in the last 168 hours. Coagulation Profile: No results for input(s): "INR", "PROTIME" in the last 168 hours. Cardiac Enzymes: No results for input(s): "CKTOTAL", "CKMB", "CKMBINDEX", "TROPONINI" in the last 168 hours. BNP (last 3 results) No results for input(s): "PROBNP" in the last 8760 hours. HbA1C: No results for input(s): "HGBA1C" in the last 72 hours. CBG: No results for input(s): "GLUCAP" in the last 168 hours.  Lipid Profile: No results for input(s): "CHOL", "HDL", "LDLCALC", "TRIG", "CHOLHDL", "LDLDIRECT" in the last 72 hours. Thyroid Function Tests: No results for input(s): "TSH", "T4TOTAL", "FREET4", "T3FREE", "THYROIDAB" in the last 72 hours. Anemia Panel: No results for input(s): "VITAMINB12", "FOLATE", "FERRITIN", "TIBC", "IRON", "RETICCTPCT" in the last 72 hours. Sepsis Labs: No results for input(s): "PROCALCITON", "LATICACIDVEN" in the last 168 hours.  No results found for this or any previous visit (from  the past 240 hour(s)).    Radiology Studies: No results found.    Scheduled Meds:  acetaminophen  650 mg Oral QID   apixaban  5 mg Oral BID   docusate sodium  100 mg Oral BID   lidocaine  1 patch Transdermal Q24H   metoprolol succinate  12.5 mg Oral Daily   polyethylene glycol  17 g Oral Daily   senna-docusate  1 tablet Oral BID   sodium chloride flush  3 mL Intravenous Q12H   Continuous Infusions:  promethazine (PHENERGAN) injection (IM or IVPB) 12.5 mg (12/14/22 1532)      LOS: 13 days     Alma Friendly, MD [facility] 12/22/2022, 4:41 PM   Available via Epic secure chat 7am-7pm After these hours, please refer to coverage provider listed on [email or web address]

## 2022-12-22 NOTE — Progress Notes (Signed)
Mobility Specialist - Progress Note   12/22/22 1528  Mobility  Activity Ambulated with assistance in hallway  Level of Assistance Standby assist, set-up cues, supervision of patient - no hands on  Assistive Device Front wheel walker  Distance Ambulated (ft) 240 ft  Activity Response Tolerated well  Mobility Referral Yes  $Mobility charge 1 Mobility   Pt received in bed and agreeable to mobility. No complaints during session. Pt to bed after session with all needs met.     (Pt ambulated earlier this morning, but mobility specialist forgot to record)  [place] Specialist

## 2022-12-22 NOTE — Progress Notes (Signed)
Patient transferred to room 1607 in no distress with all belongings.

## 2022-12-23 ENCOUNTER — Telehealth: Payer: Self-pay | Admitting: Nurse Practitioner

## 2022-12-23 ENCOUNTER — Other Ambulatory Visit: Payer: Self-pay

## 2022-12-23 DIAGNOSIS — K6389 Other specified diseases of intestine: Secondary | ICD-10-CM | POA: Diagnosis not present

## 2022-12-23 DIAGNOSIS — K56609 Unspecified intestinal obstruction, unspecified as to partial versus complete obstruction: Secondary | ICD-10-CM | POA: Diagnosis not present

## 2022-12-23 NOTE — Progress Notes (Signed)
Occupational Therapy Treatment Patient Details Name: Ian Estes MRN: [id number] DOB: 1941/10/02 Today's Date: 12/23/2022   History of present illness Patient is a 82 year old male who presented on 2/21 with recurrent abdominal pain. Patient underwent colonoscopy on 2/23 that revealed malignant tumor in ascending colon. 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 on 12/15/22 ,Pt underwent open R hemicolectomy with lysis of adhesion.of note patient reporting having a fall at this hospital on 3/1 with imaging negative for acute injury, [id number] from 1/7 to 1/12 secondary to acute sigmoid diverticulitis with contained perforation/abscess. He was also admitted again 1/21 to 1/25 secondary to recurrent diverticulitis. Admitted again 2/14 to 2/16 secondary to small bowel obstruction.   OT comments  Mr Ian Estes is making excellent progress. Overall min A with mobility and ADL tasks @ RW level. Improved activity tolerance noted. Continue to recommend rehab at SNF to maximize functional level of independence with goal of returning home. Will continue to follow.    Recommendations for follow up therapy are one component of a multi-disciplinary discharge planning process, led by the attending physician.  Recommendations may be updated based on patient status, additional functional criteria and insurance authorization.    Follow Up Recommendations  Skilled nursing-short term rehab (<3 hours/day)     Assistance Recommended at Discharge Frequent or constant Supervision/Assistance  Patient can return home with the following  Direct supervision/assist for financial management;Help with stairs or ramp for entrance;Assist for transportation;Direct supervision/assist for medications management;Assistance with cooking/housework;A little help with walking and/or transfers;A little help with bathing/dressing/bathroom   Equipment Recommendations  None recommended by OT     Recommendations for Other Services      Precautions / Restrictions Precautions Precautions: Fall Precaution Comments: abd surgery Required Braces or Orthoses: Other Brace (ab binder)       Mobility Bed Mobility Overal bed mobility: Needs Assistance       Supine to sit: Supervision, HOB elevated Sit to supine: Min guard        Transfers Overall transfer level: Needs assistance Equipment used: Rolling walker (2 wheels) Transfers: Sit to/from Stand Sit to Stand: Min guard                 Balance Overall balance assessment: Needs assistance Sitting-balance support: Feet supported Sitting balance-Leahy Scale: Good       Standing balance-Leahy Scale: Poor                             ADL either performed or assessed with clinical judgement   ADL Overall ADL's : Needs assistance/impaired         Upper Body Bathing: Set up   Lower Body Bathing: Min guard   Upper Body Dressing : Set up   Lower Body Dressing: Min guard   Toilet Transfer: Min guard;Ambulation   Toileting- Clothing Manipulation and Hygiene: Minimal assistance       Functional mobility during ADLs: Min guard;Rolling walker (2 wheels); ambulated @ 150 ft with RW; not wanting to walk without it at this time General ADL Comments: encouraged rolling to sidelying to protect abdomen however pt has his "own way of moving"    Extremity/Trunk Assessment Upper Extremity Assessment RUE Deficits / Details: noted to have signs of possible arthritis in digits joints bilaterally. shoulder wfl. noted alot of brusing on BUE   Lower Extremity Assessment Lower Extremity Assessment: Defer to PT evaluation        Vision  Additional Comments: wears glasses   Perception     Praxis      Cognition Arousal/Alertness: Awake/alert Behavior During Therapy: WFL for tasks assessed/performed Overall Cognitive Status: No family/caregiver present to determine baseline cognitive functioning                                  General Comments: most likely baseline        Exercises Exercises: Other exercises Other Exercises Other Exercises: sit - stand x 5 with focus on controlled descent    Shoulder Instructions       General Comments      Pertinent Vitals/ Pain       Pain Assessment Pain Assessment: Faces Faces Pain Scale: Hurts a little bit Pain Location: abdomen Pain Descriptors / Indicators: Discomfort Pain Intervention(s): Limited activity within patient's tolerance  Home Living                                          Prior Functioning/Environment              Frequency  Min 2X/week        Progress Toward Goals  OT Goals(current goals can now be found in the care plan section)  Progress towards OT goals: Progressing toward goals  Acute Rehab OT Goals Patient Stated Goal: to get better and go home OT Goal Formulation: With patient Time For Goal Achievement: 01/02/23 Potential to Achieve Goals: Good ADL Goals Pt Will Perform Lower Body Dressing: with supervision;with adaptive equipment;sit to/from stand Pt Will Transfer to Toilet: with supervision;ambulating;regular height toilet Pt Will Perform Toileting - Clothing Manipulation and hygiene: with supervision;sit to/from stand Additional ADL Goal #1: Patient will perform 10 min functional activity or exercise activity as evidence of improving activity tolerance  Plan Discharge plan remains appropriate    Co-evaluation                 AM-PAC OT "6 Clicks" Daily Activity     Outcome Measure   Help from another person eating meals?: None Help from another person taking care of personal grooming?: A Little Help from another person toileting, which includes using toliet, bedpan, or urinal?: A Little Help from another person bathing (including washing, rinsing, drying)?: A Little Help from another person to put on and taking off regular upper body  clothing?: A Little Help from another person to put on and taking off regular lower body clothing?: A Little 6 Click Score: 19    End of Session Equipment Utilized During Treatment: Rolling walker (2 wheels);Gait belt  OT Visit Diagnosis: Unsteadiness on feet (R26.81);Other abnormalities of gait and mobility (R26.89);Muscle weakness (generalized) (M62.81);Pain Pain - part of body:  (abdomen)   Activity Tolerance Patient tolerated treatment well   Patient Left in bed;with call bell/phone within reach;with bed alarm set   Nurse Communication Mobility status        Time: :[id number] OT Time Calculation (min): 24 min  Charges: OT General Charges $OT Visit: 1 Visit OT Treatments $Self Care/Home Management : 23-37 mins  Maurie Boettcher, OT/L   Acute OT Clinical Specialist [place] Pager [phone] Office [phone]   [facility] 12/23/2022, 12:45 PM

## 2022-12-23 NOTE — Progress Notes (Signed)
The proposed treatment discussed in conference is for discussion purpose only and is not a binding recommendation.  The patients have not been physically examined, or presented with their treatment options.  Therefore, final treatment plans cannot be decided.  

## 2022-12-23 NOTE — Progress Notes (Signed)
PROGRESS NOTE    Ian Estes  [id number] DOB: 05-28-1941 DOA: 12/09/2022 PCP: Aletha Halim., PA-C   Brief Narrative:   82 y.o. male with medical history significant of CKD stage III, A-fib on Eliquis, hypertension, hyperlipidemia, admission from 10/25/2022-10/30/2022 due to acute sigmoid diverticulitis with contained perforation/abscess which was treated with antibiotics and medical management; admission again from 11/08/2022-11/12/2022 secondary to recurrent diverticulitis treated medically; readmission from 12/02/2022-12/04/2022 due to small bowel obstruction which improved with conservative management with recommendations for outpatient colonoscopy presented again with recurrent abdominal pain and was found to have recurrent SBO.  General surgery was consulted.  Due to hematochezia, GI was consulted.  He underwent colonoscopy on 12/11/2022 by Dr. 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  He underwent open right hemicolectomy on 12/15/2022 with lysis of adhesions.  PT recommending SNF placement.  Assessment & Plan:   Recurrent small bowel obstruction, recent history of diverticulitis New diagnosis of right colon adenocarcinoma status post laparoscopic converted to open right hemicolectomy and lysis of adhesions on 12/15/2022 -Currently tolerating advanced diet as per general surgery.  Wound care as per general surgery recommendations. -Prior hospitalist discussed with Dr. Iruku/oncology who recommended outpatient follow-up with oncology.  AKI on CKD stage IIIa -Resolved with IV fluids.  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  Hemoglobin stable.  Chronic A-fib -Currently rate controlled.  Continue Eliquis and metoprolol succinate  Hypertension--blood pressure on the lower side.  Continue metoprolol  succinate  Mechanical fall -PT recommending SNF placement.  Fall precautions.  TOC following.  DVT prophylaxis: Eliquis Code Status: DNR Family Communication: None at bedside Disposition Plan: Status is: Inpatient Remains inpatient appropriate because: Of need for SNF placement.  Currently medically stable for discharge to SNF  Consultants: GI/general surgery  Procedures: As above  Antimicrobials:  Anti-infectives (From admission, onward)    Start     Dose/Rate Route Frequency Ordered Stop   12/15/22 0915  cefoTEtan (CEFOTAN) 2 g in sodium chloride 0.9 % 100 mL IVPB        2 g 200 mL/hr over 30 Minutes Intravenous On call to O.R. 12/15/22 0906 12/15/22 1123   12/15/22 0909  sodium chloride 0.9 % with cefoTEtan (CEFOTAN) ADS Med       Note to Pharmacy: [name] B: cabinet override      12/15/22 0909 12/15/22 1122        Subjective: Patient seen and examined at bedside.  Tolerating diet.  Denies worsening abdominal pain, vomiting, chest pain or shortness of breath.  Objective: Vitals:   12/21/22 1400 12/21/22 2012 12/22/22 1332 12/23/22 0520  BP: 114/67 (!) 98/54 (!) 115/58 105/61  Pulse: 78 72 67 76  Resp: '20 18 18 20  '$ Temp: 97.6 F (36.4 C) 97.7 F (36.5 C) 98.2 F (36.8 C) (!) 97.4 F (36.3 C)  TempSrc: Oral Oral Oral Oral  SpO2: 98% 98% 99% 100%  Weight:      Height:        Intake/Output Summary (Last 24 hours) at 12/23/2022 0954 Last data filed at 12/23/2022 [id number] Gross per 24 hour  Intake 240 ml  Output --  Net 240 ml   Filed Weights   12/09/22 1415 12/15/22 0807  Weight: 89.4 kg 89.4 kg    Examination:  General exam: Appears calm and comfortable.  Looks chronically ill and deconditioned.  Currently on room air. Respiratory  system: Bilateral decreased breath sounds at bases with some scattered crackles Cardiovascular system: S1 & S2 heard, Rate controlled Gastrointestinal system: Abdomen is slightly distended; soft and nontender. Normal bowel  sounds heard.  Abdominal incision present. Extremities: No cyanosis, clubbing; trace lower extremity edema present     Data Reviewed: I have personally reviewed following labs and imaging studies  CBC: Recent Labs  Lab 12/17/22 0759 12/18/22 0536 12/19/22 0536 12/20/22 0453 12/21/22 0443  WBC 8.8 6.8 6.8 9.0 8.9  NEUTROABS 6.6 4.8 4.8 6.6 6.1  HGB 10.4* 9.2* 9.1* 9.2* 10.2*  HCT 34.2* 30.2* 29.9* 29.7* 32.6*  MCV 85.5 85.6 84.9 85.6 84.2  PLT 316 238 222 233 0000000   Basic Metabolic Panel: Recent Labs  Lab 12/17/22 0759 12/18/22 0536 12/19/22 0536 12/20/22 0453 12/21/22 0443  NA 136 135 138 135 135  K 4.1 3.5 3.7 3.7 4.3  CL 106 108 110 110 107  CO2 21* 20* 21* 19* 22  GLUCOSE 101* 95 93 125* 109*  BUN 36* 28* '21 18 14  '$ CREATININE 2.06* 1.35* 1.09 1.05 0.95  CALCIUM 7.9* 7.4* 7.6* 7.3* 7.9*   GFR: Estimated Creatinine Clearance: 65.8 mL/min (by C-G formula based on SCr of 0.95 mg/dL). Liver Function Tests: No results for input(s): "AST", "ALT", "ALKPHOS", "BILITOT", "PROT", "ALBUMIN" in the last 168 hours. No results for input(s): "LIPASE", "AMYLASE" in the last 168 hours. No results for input(s): "AMMONIA" in the last 168 hours. Coagulation Profile: No results for input(s): "INR", "PROTIME" in the last 168 hours. Cardiac Enzymes: No results for input(s): "CKTOTAL", "CKMB", "CKMBINDEX", "TROPONINI" in the last 168 hours. BNP (last 3 results) No results for input(s): "PROBNP" in the last 8760 hours. HbA1C: No results for input(s): "HGBA1C" in the last 72 hours. CBG: No results for input(s): "GLUCAP" in the last 168 hours. Lipid Profile: No results for input(s): "CHOL", "HDL", "LDLCALC", "TRIG", "CHOLHDL", "LDLDIRECT" in the last 72 hours. Thyroid Function Tests: No results for input(s): "TSH", "T4TOTAL", "FREET4", "T3FREE", "THYROIDAB" in the last 72 hours. Anemia Panel: No results for input(s): "VITAMINB12", "FOLATE", "FERRITIN", "TIBC", "IRON", "RETICCTPCT"  in the last 72 hours. Sepsis Labs: No results for input(s): "PROCALCITON", "LATICACIDVEN" in the last 168 hours.  No results found for this or any previous visit (from the past 240 hour(s)).       Radiology Studies: No results found.      Scheduled Meds:  acetaminophen  650 mg Oral QID   apixaban  5 mg Oral BID   docusate sodium  100 mg Oral BID   lidocaine  1 patch Transdermal Q24H   metoprolol succinate  12.5 mg Oral Daily   polyethylene glycol  17 g Oral Daily   senna-docusate  1 tablet Oral BID   sodium chloride flush  3 mL Intravenous Q12H   Continuous Infusions:  promethazine (PHENERGAN) injection (IM or IVPB) 12.5 mg (12/14/22 1532)          Aline August, MD [facility] 12/23/2022, 9:54 AM

## 2022-12-23 NOTE — TOC Progression Note (Signed)
Transition of Care [facility]) - Progression Note    Patient Details  Name: Ian Estes MRN: [id number] Date of Birth: [birth year]  Transition of Care [facility] CM/SW Contact  Lennart Pall, LCSW Phone Number: 12/23/2022, 11:17 AM  Clinical Narrative:     Have reviewed SNF bed offers with pt and sister and he has accepted bed with [facility] in [place] [employer].  Have begun insurance authorization.  Expected Discharge Plan: [place] Barriers to Discharge: Continued Medical Work up  Expected Discharge Plan and Services       Living arrangements for the past 2 months: Single Family Home                                       Social Determinants of Health (SDOH) Interventions SDOH Screenings   Food Insecurity: No Food Insecurity (12/09/2022)  Housing: Low Risk  (12/09/2022)  Transportation Needs: No Transportation Needs (12/09/2022)  Utilities: Not At Risk (12/09/2022)  Tobacco Use: Medium Risk (12/16/2022)    Readmission Risk Interventions    12/03/2022    1:56 PM 10/27/2022    9:24 AM  Readmission Risk Prevention Plan  Post Dischage Appt  Complete  Medication Screening  Complete  Transportation Screening Complete Complete  PCP or Specialist Appt within 5-7 Days Complete   Home Care Screening Complete   Medication Review (RN CM) Complete

## 2022-12-23 NOTE — Telephone Encounter (Signed)
Scheduled appt per 2/27 referral. Pt is aware of appt date and time. Pt is aware to arrive 15 mins prior to appt time and to bring and updated insurance card. Pt is aware of appt location.   °

## 2022-12-23 NOTE — Progress Notes (Signed)
PT Cancellation Note  Patient Details Name: Ian Estes MRN: [id number] DOB: 1940-12-08   Cancelled Treatment:    Reason Eval/Treat Not Completed: Other (comment). Pt politely declines, reports recently working with OT and wanting to rest. Pt reports "I exercise my legs in the bed" and requests therapy check on him another day. Will continue to follow.    Talbot Grumbling PT, DPT 12/23/22, 12:53 PM

## 2022-12-24 DIAGNOSIS — E440 Moderate protein-calorie malnutrition: Secondary | ICD-10-CM | POA: Diagnosis not present

## 2022-12-24 DIAGNOSIS — C182 Malignant neoplasm of ascending colon: Secondary | ICD-10-CM | POA: Diagnosis not present

## 2022-12-24 DIAGNOSIS — Z7401 Bed confinement status: Secondary | ICD-10-CM | POA: Diagnosis not present

## 2022-12-24 DIAGNOSIS — Z8719 Personal history of other diseases of the digestive system: Secondary | ICD-10-CM | POA: Diagnosis not present

## 2022-12-24 DIAGNOSIS — E1122 Type 2 diabetes mellitus with diabetic chronic kidney disease: Secondary | ICD-10-CM | POA: Diagnosis not present

## 2022-12-24 DIAGNOSIS — K6389 Other specified diseases of intestine: Secondary | ICD-10-CM | POA: Diagnosis not present

## 2022-12-24 DIAGNOSIS — K56699 Other intestinal obstruction unspecified as to partial versus complete obstruction: Secondary | ICD-10-CM | POA: Diagnosis not present

## 2022-12-24 DIAGNOSIS — E7849 Other hyperlipidemia: Secondary | ICD-10-CM | POA: Diagnosis not present

## 2022-12-24 DIAGNOSIS — K56609 Unspecified intestinal obstruction, unspecified as to partial versus complete obstruction: Secondary | ICD-10-CM | POA: Diagnosis not present

## 2022-12-24 DIAGNOSIS — I10 Essential (primary) hypertension: Secondary | ICD-10-CM | POA: Diagnosis not present

## 2022-12-24 DIAGNOSIS — R531 Weakness: Secondary | ICD-10-CM | POA: Diagnosis not present

## 2022-12-24 DIAGNOSIS — E1169 Type 2 diabetes mellitus with other specified complication: Secondary | ICD-10-CM | POA: Diagnosis not present

## 2022-12-24 DIAGNOSIS — I482 Chronic atrial fibrillation, unspecified: Secondary | ICD-10-CM | POA: Diagnosis not present

## 2022-12-24 DIAGNOSIS — Z743 Need for continuous supervision: Secondary | ICD-10-CM | POA: Diagnosis not present

## 2022-12-24 MED ORDER — SENNOSIDES-DOCUSATE SODIUM 8.6-50 MG PO TABS: 1.0000 | ORAL_TABLET | Freq: Two times a day (BID) | ORAL | 0 refills | Status: AC

## 2022-12-24 MED ORDER — METHOCARBAMOL 500 MG PO TABS: 500.0000 mg | ORAL_TABLET | Freq: Four times a day (QID) | ORAL | 0 refills | Status: DC | PRN

## 2022-12-24 MED ORDER — POLYETHYLENE GLYCOL 3350 17 G PO PACK: 17.0000 g | PACK | Freq: Every day | ORAL | 0 refills | Status: AC

## 2022-12-24 MED ORDER — LIDOCAINE 5 % EX PTCH: 1.0000 | MEDICATED_PATCH | CUTANEOUS | 0 refills | Status: DC

## 2022-12-24 NOTE — Progress Notes (Signed)
PT Cancellation Note  Patient Details Name: Random Tousant MRN: [id number] DOB: 1941-08-13   Cancelled Treatment:    Reason Eval/Treat Not Completed: Patient declined, verbalizing that he  is upset that there was no toilet paper. Declined ambulation at this time. RN aware.  [place] Office [phone] Weekend pager-[phone]    Claretha Cooper 12/24/2022, 9:55 AM

## 2022-12-24 NOTE — TOC Transition Note (Signed)
Transition of Care [facility]) - CM/SW Discharge Note   Patient Details  Name: Ian Estes MRN: [id number] Date of Birth: [birth year]  Transition of Care [facility] CM/SW Contact:  Lennart Pall, LCSW Phone Number: 12/24/2022, 1:49 PM   Clinical Narrative:    Have received insurance authorization for [facility] and pt is medically cleared for dc today.  Pt and sister aware and agreeable.  PTAR calle at 1:35pm.  RN to call report to [phone].  No further TOC needs.   Final next level of care: Skilled Nursing Facility Barriers to Discharge: Barriers Resolved   Patient Goals and CMS Choice CMS Medicare.gov Compare Post Acute Care list provided to:: Patient Choice offered to / list presented to : Patient  Discharge Placement PASRR number recieved: 12/22/22 PASRR number recieved: 12/22/22            Patient chooses bed at: [place] Patient to be transferred to facility by: [place] Name of family member notified: sister, [name] Patient and family notified of of transfer: 12/24/22  Discharge Plan and Services Additional resources added to the After Visit Summary for                  DME Arranged: N/A DME Agency: NA                  Social Determinants of Health ([place]) Interventions SDOH Screenings   Food Insecurity: No Food Insecurity (12/09/2022)  Housing: Low Risk  (12/09/2022)  Transportation Needs: No Transportation Needs (12/09/2022)  Utilities: Not At Risk (12/09/2022)  Tobacco Use: Medium Risk (12/16/2022)     Readmission Risk Interventions    12/24/2022    8:45 AM 12/03/2022    1:56 PM 10/27/2022    9:24 AM  Readmission Risk Prevention Plan  Post Dischage Appt   Complete  Medication Screening   Complete  Transportation Screening Complete Complete Complete  PCP or Specialist Appt within 5-7 Days  Complete   PCP or Specialist Appt within 3-5 Days Complete    Home Care Screening  Complete   Medication Review (RN CM)  Complete   HRI or Home Care  Consult Complete    Social Work Consult for [place] Planning/Counseling Complete

## 2022-12-24 NOTE — Care Management Important Message (Signed)
Important Message  Patient Details IM Letter given. Name: Ian Estes MRN: [id number] Date of Birth: Jan 17, 1941   Medicare Important Message Given:  Yes     Kerin Salen 12/24/2022, 10:03 AM

## 2022-12-24 NOTE — Discharge Summary (Addendum)
Physician Discharge Summary  Ian Estes [id number] DOB: 1941/02/23 DOA: 12/09/2022  PCP: Aletha Halim., PA-C  Admit date: 12/09/2022 Discharge date: 12/24/2022  Admitted From: Home Disposition: SNF  Recommendations for Outpatient Follow-up:  Follow up with PCP in 1 week with repeat CBC/BMP Outpatient follow-up with general surgery.  Wound care as per general surgery recommendations.  Diet as per general surgery recommendations.   Outpatient follow-up with oncology Follow up in ED if symptoms worsen or new appear   Home Health: No Equipment/Devices: None  Discharge Condition: Stable CODE STATUS: DNR Diet recommendation: Heart healthy/soft diet as per general surgery  Brief/Interim Summary:  82 y.o. male with medical history significant of CKD stage III, A-fib on Eliquis, hypertension, hyperlipidemia, admission from 10/25/2022-10/30/2022 due to acute sigmoid diverticulitis with contained perforation/abscess which was treated with antibiotics and medical management; admission again from 11/08/2022-11/12/2022 secondary to recurrent diverticulitis treated medically; readmission from 12/02/2022-12/04/2022 due to small bowel obstruction which improved with conservative management with recommendations for outpatient colonoscopy presented again with recurrent abdominal pain and was found to have recurrent SBO.  General surgery was consulted.  Due to hematochezia, GI was consulted.  He underwent colonoscopy on 12/11/2022 by Dr. 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  He underwent open right hemicolectomy on 12/15/2022 with lysis of adhesions.  PT recommending SNF placement.  Oncology recommended outpatient follow-up.  He is currently hemodynamically stable and tolerating diet and general surgery has cleared him for discharge.  He will be discharged to SNF once bed is  available.  Discharge Diagnoses:   Recurrent small bowel obstruction, recent history of diverticulitis New diagnosis of right colon adenocarcinoma status post laparoscopic converted to open right hemicolectomy and lysis of adhesions on 12/15/2022 -Currently tolerating advanced diet as per general surgery.  Wound care as per general surgery recommendations. -Prior hospitalist discussed with Dr. Iruku/oncology who recommended outpatient follow-up with oncology. - general surgery has cleared him for discharge.  He will be discharged to SNF once bed is available.   AKI on CKD stage IIIa -Resolved with IV fluids.  Currently off IV fluids   Hyponatremia -Resolved   Normocytic anemia -From anemia of chronic disease.  Hemoglobin stable.   Chronic A-fib -Currently rate controlled.  Continue Eliquis and metoprolol succinate   Hypertension--blood pressure on the lower side.  Continue metoprolol succinate   Mechanical fall -PT recommending SNF placement.  Fall precautions.      Discharge Instructions  Discharge Instructions     Ambulatory referral to Hematology / Oncology   Complete by: As directed       Allergies as of 12/24/2022   No Known Allergies      Medication List     STOP taking these medications    Ensure   Ensure Max Protein Liqd   Pedialyte Soln       TAKE these medications    apixaban 5 MG Tabs tablet Commonly known as: Eliquis Take 1 tablet (5 mg total) by mouth 2 (two) times daily.   lidocaine 5 % Commonly known as: LIDODERM Place 1 patch onto the skin daily. Remove & Discard patch within 12 hours or as directed by MD   methocarbamol 500 MG tablet Commonly known as: ROBAXIN Take 1 tablet (500 mg total) by mouth every 6 (six) hours as needed for muscle spasms.   metoprolol succinate 25 MG 24 hr tablet Commonly known as: TOPROL-XL Take 0.5 tablets (12.5 mg  total) by mouth daily.   polyethylene glycol 17 g packet Commonly known as: MIRALAX /  GLYCOLAX Take 17 g by mouth daily.   senna-docusate 8.6-50 MG tablet Commonly known as: Senokot-S Take 1 tablet by mouth 2 (two) times daily.   TYLENOL 500 MG tablet Generic drug: acetaminophen Take 1,000 mg by mouth every 6 (six) hours as needed for mild pain.          Contact information for follow-up providers     [facility] [place] Follow up.   Specialty: [place] Why: [facility] physical therapy Contact information: [address] [place] [address] [phone]         Dwan Bolt, MD. Go on 01/06/2023.   Specialty: General Surgery Why: follow up 3/20 at 420pm. Please arrive 30 minutes early to complete check in, and bring photo ID and insurance card. Contact information: [place] [address] [phone]              Contact information for after-discharge care     [facility] .   Service: Skilled Nursing Contact information: [address] [place] [phone]                    No Known Allergies  Consultations: GI/general surgery    Procedures/Studies: DG Ribs Bilateral  Result Date: 12/18/2022 CLINICAL DATA:  Fall EXAM: BILATERAL RIBS - 3+ VIEW COMPARISON:  None Available. FINDINGS: No fracture or other bone lesions are seen involving the ribs. IMPRESSION: Negative. Electronically Signed   By: Margaretha Sheffield M.D.   On: 12/18/2022 08:20   DG Forearm Left  Result Date: 12/18/2022 CLINICAL DATA:  Unwitnessed fall.  Left hand swelling and bruising EXAM: LEFT FOREARM - 2 VIEW COMPARISON:  None Available. FINDINGS: No acute fracture or dislocation. Generalized osteopenia. Metallic foreign body measuring 2 mm at the medial forearm, age indeterminate. Atheromatous calcification. IMPRESSION: 1. No acute fracture dislocation. 2. 2 mm metallic foreign body at the medial forearm. Electronically Signed   By: Jorje Guild  M.D.   On: 12/18/2022 08:20   DG Hand 2 View Left  Result Date: 12/18/2022 CLINICAL DATA:  Unwitnessed fall with left hand swelling. EXAM: LEFT HAND - 2 VIEW COMPARISON:  None Available. FINDINGS: No acute fracture or dislocation. MCP and interphalangeal osteoarthritis with erosive changes at the D IP joint of the index, middle, and little fingers. Subjective osteopenia. IMPRESSION: 1. No acute finding. 2. Erosive osteoarthritis. Electronically Signed   By: Jorje Guild M.D.   On: 12/18/2022 08:19   CT CHEST WO CONTRAST  Result Date: 12/14/2022 CLINICAL DATA:  Ascending colonic mass for staging EXAM: CT CHEST WITHOUT CONTRAST TECHNIQUE: Multidetector CT imaging of the chest was performed following the standard protocol without IV contrast. RADIATION DOSE REDUCTION: This exam was performed according to the departmental dose-optimization program which includes automated exposure control, adjustment of the mA and/or kV according to patient size and/or use of iterative reconstruction technique. COMPARISON:  CTA chest dated 12/20/2018 FINDINGS: Cardiovascular: Normal heart size. No significant pericardial fluid/thickening. Coronary artery calcifications and aortic atherosclerosis. Great vessels are normal in course and caliber. Mediastinum/Nodes: Imaged thyroid gland without nodules meeting criteria for imaging follow-up by size. Normal esophagus. No pathologically enlarged axillary, supraclavicular, mediastinal, or hilar lymph nodes. Lungs/Pleura: Focus of adherent secretions along the right lateral distal trachea. The central airways are patent. Diffuse  bronchial wall thickening and bilateral lower lobe mild bronchiectasis. No focal consolidation. Bilateral upper lobe calcified granulomata. Bilateral lower lobe subpleural scarring. No pneumothorax. Trace bilateral pleural effusions. Upper abdomen: Increased small volume ascites. Distended gallbladder. Musculoskeletal: No acute or abnormal lytic or blastic  osseous lesions. IMPRESSION: 1. No evidence of metastatic disease in the chest. 2. Trace bilateral pleural effusions. 3. Increased small volume ascites. 4. Aortic Atherosclerosis (ICD10-I70.0). Coronary artery calcifications. Assessment for potential risk factor modification, dietary therapy or pharmacologic therapy may be warranted, if clinically indicated. Electronically Signed   By: Darrin Nipper M.D.   On: 12/14/2022 09:26   DG Abd Portable 1V-Small Bowel Obstruction Protocol-initial, 8 hr delay  Result Date: 12/10/2022 CLINICAL DATA:  82 year old male with abdominal pain, CT appearance of small-bowel obstruction yesterday. 8 hours status post oral contrast administration yesterday. EXAM: PORTABLE ABDOMEN - 1 VIEW COMPARISON:  Abdominal radiographs yesterday 2052 hours and earlier. FINDINGS: Portable AP supine view at 0734 hours. Oral contrast appears to be primarily within redundant colon, including the rectum. Superimposed excreted IV contrast in the urinary bladder is also possible. Bowel-gas pattern compared to the CT scout view yesterday is improved. Mild residual gas distended small bowel loops. Stable visualized osseous structures. Pelvic sidewall surgical clips. IMPRESSION: Evidence of improved or resolved small bowel obstruction with administered oral contrast primarily within the colon. Mild residual gas distended small bowel loops. Electronically Signed   By: Genevie Ann M.D.   On: 12/10/2022 07:55   DG Abd 1 View  Result Date: 12/09/2022 CLINICAL DATA:  Check gastric catheter placement EXAM: ABDOMEN - 1 VIEW COMPARISON:  Film from earlier in the same day. FINDINGS: Gastric catheter has been advanced slightly deeper into the stomach. Proximal side port now lies in the gastric lumen. IMPRESSION: Gastric catheter has been advanced and now lies in satisfactory position. Electronically Signed   By: Inez Catalina M.D.   On: 12/09/2022 23:02   DG Abd Portable 1V-Small Bowel Protocol-Position  Verification  Result Date: 12/09/2022 CLINICAL DATA:  Check gastric catheter placement EXAM: PORTABLE ABDOMEN - 1 VIEW COMPARISON:  CT from earlier in the same day. FINDINGS: Gastric catheter is noted with the tip in the stomach. Proximal side port however is noted in the distal esophagus. This could be advanced several cm deeper into the stomach. Persistent small bowel dilatation is noted in the right hemiabdomen. IMPRESSION: Gastric catheter as described. This should be advanced deeper into the stomach. Electronically Signed   By: Inez Catalina M.D.   On: 12/09/2022 19:03   CT ABDOMEN PELVIS W CONTRAST  Result Date: 12/09/2022 CLINICAL DATA:  Abdominal pain, acute, nonlocalized. History of small-bowel obstruction. EXAM: CT ABDOMEN AND PELVIS WITH CONTRAST TECHNIQUE: Multidetector CT imaging of the abdomen and pelvis was performed using the standard protocol following bolus administration of intravenous contrast. RADIATION DOSE REDUCTION: This exam was performed according to the departmental dose-optimization program which includes automated exposure control, adjustment of the mA and/or kV according to patient size and/or use of iterative reconstruction technique. CONTRAST:  177m OMNIPAQUE IOHEXOL 300 MG/ML  SOLN COMPARISON:  CT abdomen and pelvis 12/02/2022 FINDINGS: Lower chest: Chronic fibrosis or scarring in the lung bases. No pleural effusion. Cardiomegaly and coronary atherosclerosis. Hepatobiliary: Diffusely decreased attenuation of the liver suggestive of steatosis. Mildly lobular liver contour without definite findings of cirrhosis. Unremarkable gallbladder. No biliary dilatation. Pancreas: Fatty atrophy, greatest in the pancreatic head and neck region. No ductal dilatation or acute inflammation. Spleen: Unremarkable. Adrenals/Urinary Tract: Mild chronic thickening  of the adrenal glands. Bilateral renal atrophy. 18 mm cyst in the interpolar left kidney for which no follow-up imaging is recommended.  No renal calculi or hydronephrosis. Unremarkable bladder. Stomach/Bowel: The stomach is unremarkable. There are multiple dilated loops of small bowel with air-fluid levels measuring up to 4.6 cm in diameter in the central abdomen and right upper quadrant. A small bowel loop anteriorly in the right mid abdomen demonstrates wall thickening and hyperenhancement, and adjacent inflammatory changes track into the central mesentery. The distal small bowel is decompressed, with suspected transition in the central abdomen. Numerous small bowel diverticula are again noted. There is also widespread colonic diverticulosis. An annular, a suspected mass in the proximal ascending colon is unchanged. The appendix is unremarkable. Vascular/Lymphatic: Abdominal aortic atherosclerosis without aneurysm. No enlarged lymph nodes. Reproductive: Small or absent prostate. Other: Trace pelvic free fluid. No organized fluid collection. No pneumoperitoneum. Musculoskeletal: No acute osseous abnormality or suspicious osseous lesion. IMPRESSION: 1. Recurrent small bowel obstruction with suspected transition in the central abdomen. Associated inflammation of a small bowel loop in the right mid abdomen. 2. Unchanged suspected mass in the ascending colon. 3.  Aortic Atherosclerosis (ICD10-I70.0). Electronically Signed   By: Logan Bores M.D.   On: 12/09/2022 15:36   DG Abd Portable 1V-Small Bowel Obstruction Protocol-initial, 8 hr delay  Result Date: 12/03/2022 CLINICAL DATA:  Small bowel obstruction EXAM: PORTABLE ABDOMEN - 1 VIEW COMPARISON:  CT 12/02/2022 FINDINGS: Contrast material is demonstrated throughout the colon suggesting no evidence of complete obstruction. Diffusely gas-filled small bowel without abnormal distention. Changes may represent ileus or partial obstruction. Degenerative changes in the spine and hips. Surgical clips in the pelvis. Opaque medication suggested in the left upper quadrant. IMPRESSION: Contrast material is  demonstrated throughout the colon suggesting no evidence of complete obstruction. Electronically Signed   By: Lucienne Capers M.D.   On: 12/03/2022 17:47   CT Abdomen Pelvis W Contrast  Result Date: 12/02/2022 CLINICAL DATA:  Diverticulitis EXAM: CT ABDOMEN AND PELVIS WITH CONTRAST TECHNIQUE: Multidetector CT imaging of the abdomen and pelvis was performed using the standard protocol following bolus administration of intravenous contrast. RADIATION DOSE REDUCTION: This exam was performed according to the departmental dose-optimization program which includes automated exposure control, adjustment of the mA and/or kV according to patient size and/or use of iterative reconstruction technique. CONTRAST:  149m OMNIPAQUE IOHEXOL 300 MG/ML  SOLN COMPARISON:  CT abdomen and pelvis 11/08/2022 FINDINGS: Lower chest: There are chronic appearing interstitial opacities peripherally in the lung bases, unchanged. Hepatobiliary: No focal liver abnormality is seen. No gallstones, gallbladder wall thickening, or biliary dilatation. Pancreas: Unremarkable. No pancreatic ductal dilatation or surrounding inflammatory changes. Spleen: Normal in size without focal abnormality. Adrenals/Urinary Tract: Adrenal glands are unremarkable. There is no hydronephrosis or perinephric fat stranding. 16 mm left renal cyst is unchanged. Bladder is unremarkable. Stomach/Bowel: There are few dilated jejunal loops in the left upper quadrant measuring up to 4.1 cm in diameter. Transition point is seen in the central abdomen image 2/46 where there is caliber change of small bowel with some surrounding inflammatory stranding. Distal small bowel is nondilated. Percutaneous drainage catheter has been removed in the interval. No recurrent fluid collection identified. Annular mass in the ascending colon is unchanged. There is diffuse colonic diverticulosis without evidence for acute diverticulitis. The appendix is within normal limits. The stomach is  within normal limits. Vascular/Lymphatic: Aortic atherosclerosis. No enlarged abdominal or pelvic lymph nodes. Reproductive: Prostate gland well seen. Other: There are small fat containing  bilateral inguinal hernias. There is no ascites. There is mild presacral edema which is nonspecific and unchanged. Musculoskeletal: No acute or significant osseous findings. IMPRESSION: 1. Small-bowel obstruction with transition point in the central abdomen. There is some surrounding inflammatory stranding. 2. Colonic diverticulosis without evidence for acute diverticulitis. 3. 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 4. Stable presacral edema. 5. Left Bosniak I benign renal cyst measuring 1.6 cm. No follow-up imaging is recommended. JACR 2018 Feb; 264-273, Management of the Incidental Renal Mass on CT, [employer] 2021; 814-848, Bosniak Classification of Cystic Renal Masses, Version 2019. Aortic Atherosclerosis (ICD10-I70.0). Electronically Signed   By: Ronney Asters M.D.   On: 12/02/2022 16:12      Subjective: Patient seen and examined at bedside.  Denies worsening abdominal pain, fever or vomiting.  Tolerating diet.  Discharge Exam: Vitals:   12/23/22 2149 12/24/22 0532  BP: 112/61 106/61  Pulse: (!) 55 62  Resp: 20 20  Temp: 97.7 F (36.5 C) 97.6 F (36.4 C)  SpO2: 98% 99%    General: Pt is alert, awake, not in acute distress.  Looks chronically ill and deconditioned.  On room air. Cardiovascular: rate controlled currently, S1/S2 + Respiratory: bilateral decreased breath sounds at bases Abdominal: Soft, NT, slightly distended, bowel sounds +; abdominal incision present Extremities: Trace lower extremity edema present; no cyanosis    The results of significant diagnostics from this hospitalization (including imaging, microbiology, ancillary and laboratory) are listed below for reference.     Microbiology: No results found for this or any previous visit (from the past 240 hour(s)).    Labs: BNP (last 3 results) No results for input(s): "BNP" in the last 8760 hours. Basic Metabolic Panel: Recent Labs  Lab 12/17/22 0759 12/18/22 0536 12/19/22 0536 12/20/22 0453 12/21/22 0443  NA 136 135 138 135 135  K 4.1 3.5 3.7 3.7 4.3  CL 106 108 110 110 107  CO2 21* 20* 21* 19* 22  GLUCOSE 101* 95 93 125* 109*  BUN 36* 28* '21 18 14  '$ CREATININE 2.06* 1.35* 1.09 1.05 0.95  CALCIUM 7.9* 7.4* 7.6* 7.3* 7.9*   Liver Function Tests: No results for input(s): "AST", "ALT", "ALKPHOS", "BILITOT", "PROT", "ALBUMIN" in the last 168 hours. No results for input(s): "LIPASE", "AMYLASE" in the last 168 hours. No results for input(s): "AMMONIA" in the last 168 hours. CBC: Recent Labs  Lab 12/17/22 0759 12/18/22 0536 12/19/22 0536 12/20/22 0453 12/21/22 0443  WBC 8.8 6.8 6.8 9.0 8.9  NEUTROABS 6.6 4.8 4.8 6.6 6.1  HGB 10.4* 9.2* 9.1* 9.2* 10.2*  HCT 34.2* 30.2* 29.9* 29.7* 32.6*  MCV 85.5 85.6 84.9 85.6 84.2  PLT 316 238 222 233 249   Cardiac Enzymes: No results for input(s): "CKTOTAL", "CKMB", "CKMBINDEX", "TROPONINI" in the last 168 hours. BNP: Invalid input(s): "POCBNP" CBG: No results for input(s): "GLUCAP" in the last 168 hours. D-Dimer No results for input(s): "DDIMER" in the last 72 hours. Hgb A1c No results for input(s): "HGBA1C" in the last 72 hours. Lipid Profile No results for input(s): "CHOL", "HDL", "LDLCALC", "TRIG", "CHOLHDL", "LDLDIRECT" in the last 72 hours. Thyroid function studies No results for input(s): "TSH", "T4TOTAL", "T3FREE", "THYROIDAB" in the last 72 hours.  Invalid input(s): "FREET3" Anemia work up No results for input(s): "VITAMINB12", "FOLATE", "FERRITIN", "TIBC", "IRON", "RETICCTPCT" in the last 72 hours. Urinalysis    Component Value Date/Time   COLORURINE YELLOW 12/02/2022 1711   APPEARANCEUR CLEAR 12/02/2022 1711   LABSPEC 1.024 12/02/2022 1711   PHURINE 5.0 12/02/2022  [place] 12/02/2022 1711   HGBUR NEGATIVE  12/02/2022 [place] 12/02/2022 1711   KETONESUR NEGATIVE 12/02/2022 1711   PROTEINUR NEGATIVE 12/02/2022 1711   UROBILINOGEN 0.2 06/13/2014 1742   NITRITE NEGATIVE 12/02/2022 1711   LEUKOCYTESUR NEGATIVE 12/02/2022 1711   Sepsis Labs Recent Labs  Lab 12/18/22 0536 12/19/22 0536 12/20/22 0453 12/21/22 0443  WBC 6.8 6.8 9.0 8.9   Microbiology No results found for this or any previous visit (from the past 240 hour(s)).   Time coordinating discharge: 35 minutes  SIGNED:   Aline August, MD  [facility] 12/24/2022, 7:34 AM

## 2022-12-24 NOTE — Progress Notes (Signed)
Pt discharged to [facility] via [facility] in stable condition. Report called to [facility] LPN.

## 2022-12-29 ENCOUNTER — Encounter: Payer: Self-pay | Admitting: Cardiology

## 2023-01-03 DIAGNOSIS — K5903 Drug induced constipation: Secondary | ICD-10-CM | POA: Diagnosis not present

## 2023-01-05 ENCOUNTER — Other Ambulatory Visit: Payer: Self-pay

## 2023-01-05 DIAGNOSIS — C182 Malignant neoplasm of ascending colon: Secondary | ICD-10-CM

## 2023-01-06 ENCOUNTER — Other Ambulatory Visit: Payer: Self-pay

## 2023-01-06 ENCOUNTER — Inpatient Hospital Stay: Payer: Medicare HMO

## 2023-01-06 ENCOUNTER — Encounter: Payer: Self-pay | Admitting: Nurse Practitioner

## 2023-01-06 ENCOUNTER — Inpatient Hospital Stay: Payer: Medicare HMO | Attending: Nurse Practitioner | Admitting: Nurse Practitioner

## 2023-01-06 ENCOUNTER — Other Ambulatory Visit: Payer: Self-pay | Admitting: Nurse Practitioner

## 2023-01-06 VITALS — BP 119/59 | HR 63 | Temp 97.8°F | Resp 17 | Ht 72.0 in | Wt 194.3 lb

## 2023-01-06 DIAGNOSIS — Z803 Family history of malignant neoplasm of breast: Secondary | ICD-10-CM | POA: Diagnosis not present

## 2023-01-06 DIAGNOSIS — Z808 Family history of malignant neoplasm of other organs or systems: Secondary | ICD-10-CM | POA: Diagnosis not present

## 2023-01-06 DIAGNOSIS — R634 Abnormal weight loss: Secondary | ICD-10-CM | POA: Insufficient documentation

## 2023-01-06 DIAGNOSIS — I4891 Unspecified atrial fibrillation: Secondary | ICD-10-CM | POA: Diagnosis not present

## 2023-01-06 DIAGNOSIS — Z8546 Personal history of malignant neoplasm of prostate: Secondary | ICD-10-CM | POA: Insufficient documentation

## 2023-01-06 DIAGNOSIS — C182 Malignant neoplasm of ascending colon: Secondary | ICD-10-CM

## 2023-01-06 DIAGNOSIS — Z7901 Long term (current) use of anticoagulants: Secondary | ICD-10-CM | POA: Diagnosis not present

## 2023-01-06 DIAGNOSIS — Z87891 Personal history of nicotine dependence: Secondary | ICD-10-CM | POA: Diagnosis not present

## 2023-01-06 DIAGNOSIS — D649 Anemia, unspecified: Secondary | ICD-10-CM

## 2023-01-06 LAB — CMP (CANCER CENTER ONLY)
ALT: 16 U/L (ref 0–44)
AST: 27 U/L (ref 15–41)
Albumin: 3.2 g/dL — ABNORMAL LOW (ref 3.5–5.0)
Alkaline Phosphatase: 167 U/L — ABNORMAL HIGH (ref 38–126)
Anion gap: 7 (ref 5–15)
BUN: 11 mg/dL (ref 8–23)
CO2: 27 mmol/L (ref 22–32)
Calcium: 8.7 mg/dL — ABNORMAL LOW (ref 8.9–10.3)
Chloride: 101 mmol/L (ref 98–111)
Creatinine: 1.25 mg/dL — ABNORMAL HIGH (ref 0.61–1.24)
GFR, Estimated: 57 mL/min — ABNORMAL LOW (ref >60)
Glucose, Bld: 102 mg/dL — ABNORMAL HIGH (ref 70–99)
Potassium: 4.9 mmol/L (ref 3.5–5.1)
Sodium: 135 mmol/L (ref 135–145)
Total Bilirubin: 0.4 mg/dL (ref 0.3–1.2)
Total Protein: 7 g/dL (ref 6.5–8.1)

## 2023-01-06 LAB — CBC WITH DIFFERENTIAL (CANCER CENTER ONLY)
Abs Immature Granulocytes: 0.02 10*3/uL (ref 0.00–0.07)
Basophils Absolute: 0.1 10*3/uL (ref 0.0–0.1)
Basophils Relative: 1 %
Eosinophils Absolute: 0.2 10*3/uL (ref 0.0–0.5)
Eosinophils Relative: 3 %
HCT: 33.9 % — ABNORMAL LOW (ref 39.0–52.0)
Hemoglobin: 10.6 g/dL — ABNORMAL LOW (ref 13.0–17.0)
Immature Granulocytes: 0 %
Lymphocytes Relative: 19 %
Lymphs Abs: 1.2 10*3/uL (ref 0.7–4.0)
MCH: 26.3 pg (ref 26.0–34.0)
MCHC: 31.3 g/dL (ref 30.0–36.0)
MCV: 84.1 fL (ref 80.0–100.0)
Monocytes Absolute: 0.8 10*3/uL (ref 0.1–1.0)
Monocytes Relative: 13 %
Neutro Abs: 4 10*3/uL (ref 1.7–7.7)
Neutrophils Relative %: 64 %
Platelet Count: 356 10*3/uL (ref 150–400)
RBC: 4.03 MIL/uL — ABNORMAL LOW (ref 4.22–5.81)
RDW: 18.1 % — ABNORMAL HIGH (ref 11.5–15.5)
WBC Count: 6.2 10*3/uL (ref 4.0–10.5)
nRBC: 0 % (ref 0.0–0.2)

## 2023-01-06 LAB — CEA (ACCESS): CEA (CHCC): 8.38 ng/mL — ABNORMAL HIGH (ref 0.00–5.00)

## 2023-01-06 NOTE — Progress Notes (Addendum)
[place]   Telephone:(336) [phone] Fax:([phone]) [place] Note   Patient Care Team: Aletha Halim., PA-C as PCP - General (Family Medicine) Minus Breeding, MD as PCP - Cardiology (Cardiology) Juanita Craver, MD as Consulting Physician (Gastroenterology) Greer Pickerel, MD as Consulting Physician (General Surgery) Michael Boston, MD as Consulting Physician (Colon and Rectal Surgery) Truitt Merle, MD as Consulting Physician (Hematology and Oncology) 01/06/2023  CHIEF COMPLAINTS/PURPOSE OF CONSULTATION:  Ascending colon cancer, referred by hospitalist  HISTORY OF PRESENTING ILLNESS:  Ian Estes 82 y.o. male with PMH including prostate cancer, A-fib, HTN, diverticulitis, GERD, DM, HL, gout, CKD, anxiety, is here because of newly diagnosed colon cancer.  Initially presented to ED 10/25/2022 with abdominal pain.  Labs showed leukocytosis and mild anemia.  CT showed sigmoid diverticulosis with superimposed bowel wall thickening and inflammatory changes with a contained perforation/abscess in adjacent mesentery measuring 6.8 x 4.7 x 5.2 cm as well as bowel wall thickening and mild associated fat stranding involving the ascending colon.  An enlarged lymph node in the mesentery was felt to be reactive.  He was treated with drain placement with plan for outpatient GI and surgery follow-up, and discharged 1/12 only to present back to ED 1/21 with weakness.  CT AP showed active sigmoid diverticulitis with inflammation which was new from prior, a suspected proximal colon mass, and the drain catheter has cannulated the small bowel lumen through the diverticulum neck.  It was removed by IR 1/22; GI was consulted.  Baseline CEA normal at 4.5 on 11/11/2022.  Discharged 1/25 with plans for outpatient colonoscopy and surgery follow-up.  He presented back to ED 12/02/2022 for worsening pain.  CT showed small bowel obstruction with transition point in the central abdomen with surrounding  inflammatory stranding.  He was treated conservatively and improved clinically so he was discharged 12/04/2022.  An outpatient colonoscopy was scheduled on 3/4 however he presented back to ED 12/09/2022 with worsening pain and nausea.  CT showed recurrent SBO.  Colonoscopy 2/23 by Dr. Benson Norway showed a fungating infiltrative nonobstructing large mass in the ascending colon which was partially circumferential measuring 5 cm in length without bleeding.  Path showed invasive moderately differentiated adenocarcinoma.  Staging CT chest 12/14/2022 showed no evidence of metastatic disease but did show trace bilateral pleural effusions and small volume ascites.  CEA now 4.9.  He underwent surgery by Dr. Zenia Resides 2/27 which was converted to open right hemicolectomy.  Surgical path showed invasive moderate to poorly differentiated adenocarcinoma with focal mucinous features invading into [facility] colonic adipose measuring 6 x 4 x 2.7 cm.  Margins were clear, 22 lymph nodes were negative (0/22).  Lymphovascular invasion was present, but no perineural invasion or perforation. This was staged PT3, pN0.  There was loss of MLH1 and PMS2; other molecular testing is pending.  He fell in the bathroom, no injury.  Case was discussed with on-call oncology who recommended outpatient follow-up.  Discharged to SNF on 12/24/2022 but only stayed 1 night.  Socially, he is widowed, lives with his son.  He was active prior to illness, a retired Dealer.  He is independent of ADLs and drives.  Drinks alcohol occasionally, smoked 1 pack cigarettes per day x 35 years but quit 35 years ago.  Denies other drug use.  He was up-to-date on colonoscopies and aged out 4-5 years ago.  Family history significant for a father with brain cancer, and a sister currently under our care for breast cancer.  Mr.  Estes presents with his sister.  He reports a 15 pounds unintentional weight loss in the last year.  He is recovering from surgery, still very weak but feels well.   Appetite still low but improving. Managing mild constipation with medication.   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"   Diverticulitis    Gout    High cholesterol    Hypertension    Obesity    Pneumonia 2014 X 1   Prostate cancer ([place])    Type II diabetes mellitus ([place])     SURGICAL HISTORY: Past Surgical History:  Procedure Laterality Date   BIOPSY  12/11/2022   Procedure: BIOPSY;  Surgeon: Carol Ada, MD;  Location: [facility];  Service: Gastroenterology;;   COLON RESECTION N/A 12/15/2022   Procedure: LAPAROSCOPIC HAND ASSISTED RIGHT HEMI COLECTOMY, POSSIBLE OPEN;  Surgeon: Dwan Bolt, MD;  Location: [facility];  Service: General;  Laterality: N/A;   COLONOSCOPY WITH PROPOFOL N/A 12/11/2022   Procedure: COLONOSCOPY WITH PROPOFOL;  Surgeon: Carol Ada, MD;  Location: [facility];  Service: Gastroenterology;  Laterality: N/A;   INCISION AND DRAINAGE PERIRECTAL ABSCESS  1990's   "hemorrhoid"   KNEE ARTHROSCOPY Right ~ 2011   POLYPECTOMY  12/11/2022   Procedure: POLYPECTOMY;  Surgeon: Carol Ada, MD;  Location: [facility];  Service: Gastroenterology;;   [place]  ~ 2005 X 2   "left foot"   SUBMUCOSAL TATTOO INJECTION  12/11/2022   Procedure: SUBMUCOSAL TATTOO INJECTION;  Surgeon: Carol Ada, MD;  Location: [facility];  Service: Gastroenterology;;   SUBXYPHOID PERICARDIAL WINDOW N/A 12/21/2018   Procedure: SUBXYPHOID PERICARDIAL WINDOW;  Surgeon: Ivin Poot, MD;  Location: [facility];  Service: Thoracic;  Laterality: N/A;   TEE WITHOUT CARDIOVERSION N/A 12/21/2018   Procedure: TRANSESOPHAGEAL ECHOCARDIOGRAM (TEE);  Surgeon: Prescott Gum, Collier Salina, MD;  Location: [facility] OR;  Service: Thoracic;  Laterality: N/A;    SOCIAL HISTORY: Social History   Socioeconomic History   Marital status: Widowed     Spouse name: Not on file   Number of children: 1   Years of education: Not on file   Highest education level: Not on file  Occupational History   Occupation: retired  Tobacco Use   Smoking status: Former    Packs/day: 1.00    Years: 35.00    Additional pack years: 0.00    Total pack years: 35.00    Types: Cigarettes    Quit date: 08/20/1987    Years since quitting: 35.4   Smokeless tobacco: Never  Vaping Use   Vaping Use: Never used  Substance and Sexual Activity   Alcohol use: Yes    Alcohol/week: 21.0 standard drinks of alcohol    Types: 21 Cans of beer per week    Comment: 3 beers a day, no liquor in 5 years   Drug use: No   Sexual activity: Not Currently  Other Topics Concern   Not on file  Social History Narrative   Not on file   Social Determinants of Health   Financial Resource Strain: Not on file  Food Insecurity: No Food Insecurity (12/09/2022)   Hunger Vital Sign    Worried About Running Out of Food in the Last Year: Never true    Ran Out of Food in the Last Year: Never true  Transportation Needs: No Transportation Needs (12/09/2022)   PRAPARE - Hydrologist (Medical): No    Lack of Transportation (Non-Medical): No  Physical Activity: Not on file  Stress: Not on file  Social Connections: Not on file  Intimate Partner Violence: Not At Risk (12/09/2022)   Humiliation, Afraid, Rape, and Kick questionnaire    Fear of Current or Ex-Partner: No    Emotionally Abused: No    Physically Abused: No    Sexually Abused: No    FAMILY HISTORY: Family History  Problem Relation Age of Onset   Cancer Father        brain   Cancer Sister        breast   CAD Neg Hx     ALLERGIES:  has No Known Allergies.  MEDICATIONS:  Current Outpatient Medications  Medication Sig Dispense Refill   apixaban (ELIQUIS) 5 MG TABS tablet Take 1 tablet (5 mg total) by mouth 2 (two) times daily. 180 tablet 1   methocarbamol (ROBAXIN) 500 MG tablet Take 1  tablet (500 mg total) by mouth every 6 (six) hours as needed for muscle spasms. 30 tablet 0   metoprolol succinate (TOPROL-XL) 25 MG 24 hr tablet Take 0.5 tablets (12.5 mg total) by mouth daily.     TYLENOL 500 MG tablet Take 1,000 mg by mouth every 6 (six) hours as needed for mild pain.     lidocaine (LIDODERM) 5 % Place 1 patch onto the skin daily. Remove & Discard patch within 12 hours or as directed by MD (Patient not taking: Reported on 01/06/2023) 30 patch 0   polyethylene glycol (MIRALAX / GLYCOLAX) 17 g packet Take 17 g by mouth daily. (Patient not taking: Reported on 01/06/2023) 14 each 0   senna-docusate (SENOKOT-S) 8.6-50 MG tablet Take 1 tablet by mouth 2 (two) times daily. (Patient not taking: Reported on 01/06/2023) 30 tablet 0   No current facility-administered medications for this visit.    REVIEW OF SYSTEMS:   Constitutional: Denies fevers, chills or abnormal night sweats (+) unintentional weight loss 15 pounds in 1 year Eyes: Denies blurriness of vision, double vision or watery eyes Ears, nose, mouth, throat, and face: Denies mucositis or sore throat Respiratory: Denies cough, dyspnea or wheezes Cardiovascular: Denies palpitation, chest discomfort or lower extremity swelling Gastrointestinal:  Denies nausea, vomiting, diarrhea, hematochezia, pain, heartburn (+) constipation Skin: Denies abnormal skin rashes Lymphatics: Denies new lymphadenopathy or easy bruising Neurological:Denies numbness, tingling (+) weaknesses Behavioral/Psych: Mood is stable, no new changes  All other systems were reviewed with the patient and are negative.  PHYSICAL EXAMINATION: ECOG PERFORMANCE STATUS: 1 - Symptomatic but completely ambulatory  Vitals:   01/06/23 1149  BP: (!) 119/59  Pulse: 63  Resp: 17  Temp: 97.8 F (36.6 C)  SpO2: 100%   Filed Weights   01/06/23 1149  Weight: 194 lb 4.8 oz (88.1 kg)    GENERAL:alert, no distress and comfortable SKIN: no rash  EYES: sclera  clear NECK: without mass LUNGS: clear with normal breathing effort HEART: regular rate & rhythm, no lower extremity edema ABDOMEN:abdomen soft, non-tender and normal bowel sounds. Midline incision closed, healing well. No erythema or drainage Musculoskeletal:no cyanosis of digits and no clubbing  PSYCH/NEURO: alert & oriented x 3 with fluent speech   LABORATORY DATA:  I have reviewed the data as listed    Latest Ref Rng & Units 01/06/2023   11:09 AM 12/21/2022    4:43 AM 12/20/2022  4:53 AM  CBC  WBC 4.0 - 10.5 K/uL 6.2  8.9  9.0   Hemoglobin 13.0 - 17.0 g/dL 10.6  10.2  9.2   Hematocrit 39.0 - 52.0 % 33.9  32.6  29.7   Platelets 150 - 400 K/uL 356  249  233       Latest Ref Rng & Units 01/06/2023   11:09 AM 12/21/2022    4:43 AM 12/20/2022    4:53 AM  CMP  Glucose 70 - 99 mg/dL 102  109  125   BUN 8 - 23 mg/dL 11  14  18    Creatinine 0.61 - 1.24 mg/dL 1.25  0.95  1.05   Sodium 135 - 145 mmol/L 135  135  135   Potassium 3.5 - 5.1 mmol/L 4.9  4.3  3.7   Chloride 98 - 111 mmol/L 101  107  110   CO2 22 - 32 mmol/L 27  22  19    Calcium 8.9 - 10.3 mg/dL 8.7  7.9  7.3   Total Protein 6.5 - 8.1 g/dL 7.0     Total Bilirubin 0.3 - 1.2 mg/dL 0.4     Alkaline Phos 38 - 126 U/L 167     AST 15 - 41 U/L 27     ALT 0 - 44 U/L 16        RADIOGRAPHIC STUDIES: I have personally reviewed the radiological images as listed and agreed with the findings in the report. DG Ribs Bilateral  Result Date: 12/18/2022 CLINICAL DATA:  Fall EXAM: BILATERAL RIBS - 3+ VIEW COMPARISON:  None Available. FINDINGS: No fracture or other bone lesions are seen involving the ribs. IMPRESSION: Negative. Electronically Signed   By: Margaretha Sheffield M.D.   On: 12/18/2022 08:20   DG Forearm Left  Result Date: 12/18/2022 CLINICAL DATA:  Unwitnessed fall.  Left hand swelling and bruising EXAM: LEFT FOREARM - 2 VIEW COMPARISON:  None Available. FINDINGS: No acute fracture or dislocation. Generalized osteopenia. Metallic  foreign body measuring 2 mm at the medial forearm, age indeterminate. Atheromatous calcification. IMPRESSION: 1. No acute fracture dislocation. 2. 2 mm metallic foreign body at the medial forearm. Electronically Signed   By: Jorje Guild M.D.   On: 12/18/2022 08:20   DG Hand 2 View Left  Result Date: 12/18/2022 CLINICAL DATA:  Unwitnessed fall with left hand swelling. EXAM: LEFT HAND - 2 VIEW COMPARISON:  None Available. FINDINGS: No acute fracture or dislocation. MCP and interphalangeal osteoarthritis with erosive changes at the D IP joint of the index, middle, and little fingers. Subjective osteopenia. IMPRESSION: 1. No acute finding. 2. Erosive osteoarthritis. Electronically Signed   By: Jorje Guild M.D.   On: 12/18/2022 08:19   CT CHEST WO CONTRAST  Result Date: 12/14/2022 CLINICAL DATA:  Ascending colonic mass for staging EXAM: CT CHEST WITHOUT CONTRAST TECHNIQUE: Multidetector CT imaging of the chest was performed following the standard protocol without IV contrast. RADIATION DOSE REDUCTION: This exam was performed according to the departmental dose-optimization program which includes automated exposure control, adjustment of the mA and/or kV according to patient size and/or use of iterative reconstruction technique. COMPARISON:  CTA chest dated 12/20/2018 FINDINGS: Cardiovascular: Normal heart size. No significant pericardial fluid/thickening. Coronary artery calcifications and aortic atherosclerosis. Great vessels are normal in course and caliber. Mediastinum/Nodes: Imaged thyroid gland without nodules meeting criteria for imaging follow-up by size. Normal esophagus. No pathologically enlarged axillary, supraclavicular, mediastinal, or hilar lymph nodes. Lungs/Pleura: Focus of adherent secretions along the right lateral  distal trachea. The central airways are patent. Diffuse bronchial wall thickening and bilateral lower lobe mild bronchiectasis. No focal consolidation. Bilateral upper lobe  calcified granulomata. Bilateral lower lobe subpleural scarring. No pneumothorax. Trace bilateral pleural effusions. Upper abdomen: Increased small volume ascites. Distended gallbladder. Musculoskeletal: No acute or abnormal lytic or blastic osseous lesions. IMPRESSION: 1. No evidence of metastatic disease in the chest. 2. Trace bilateral pleural effusions. 3. Increased small volume ascites. 4. Aortic Atherosclerosis (ICD10-I70.0). Coronary artery calcifications. Assessment for potential risk factor modification, dietary therapy or pharmacologic therapy may be warranted, if clinically indicated. Electronically Signed   By: Darrin Nipper M.D.   On: 12/14/2022 09:26   DG Abd Portable 1V-Small Bowel Obstruction Protocol-initial, 8 hr delay  Result Date: 12/10/2022 CLINICAL DATA:  82 year old male with abdominal pain, CT appearance of small-bowel obstruction yesterday. 8 hours status post oral contrast administration yesterday. EXAM: PORTABLE ABDOMEN - 1 VIEW COMPARISON:  Abdominal radiographs yesterday 2052 hours and earlier. FINDINGS: Portable AP supine view at 0734 hours. Oral contrast appears to be primarily within redundant colon, including the rectum. Superimposed excreted IV contrast in the urinary bladder is also possible. Bowel-gas pattern compared to the CT scout view yesterday is improved. Mild residual gas distended small bowel loops. Stable visualized osseous structures. Pelvic sidewall surgical clips. IMPRESSION: Evidence of improved or resolved small bowel obstruction with administered oral contrast primarily within the colon. Mild residual gas distended small bowel loops. Electronically Signed   By: Genevie Ann M.D.   On: 12/10/2022 07:55   DG Abd 1 View  Result Date: 12/09/2022 CLINICAL DATA:  Check gastric catheter placement EXAM: ABDOMEN - 1 VIEW COMPARISON:  Film from earlier in the same day. FINDINGS: Gastric catheter has been advanced slightly deeper into the stomach. Proximal side port now lies in  the gastric lumen. IMPRESSION: Gastric catheter has been advanced and now lies in satisfactory position. Electronically Signed   By: Inez Catalina M.D.   On: 12/09/2022 23:02   DG Abd Portable 1V-Small Bowel Protocol-Position Verification  Result Date: 12/09/2022 CLINICAL DATA:  Check gastric catheter placement EXAM: PORTABLE ABDOMEN - 1 VIEW COMPARISON:  CT from earlier in the same day. FINDINGS: Gastric catheter is noted with the tip in the stomach. Proximal side port however is noted in the distal esophagus. This could be advanced several cm deeper into the stomach. Persistent small bowel dilatation is noted in the right hemiabdomen. IMPRESSION: Gastric catheter as described. This should be advanced deeper into the stomach. Electronically Signed   By: Inez Catalina M.D.   On: 12/09/2022 19:03   CT ABDOMEN PELVIS W CONTRAST  Result Date: 12/09/2022 CLINICAL DATA:  Abdominal pain, acute, nonlocalized. History of small-bowel obstruction. EXAM: CT ABDOMEN AND PELVIS WITH CONTRAST TECHNIQUE: Multidetector CT imaging of the abdomen and pelvis was performed using the standard protocol following bolus administration of intravenous contrast. RADIATION DOSE REDUCTION: This exam was performed according to the departmental dose-optimization program which includes automated exposure control, adjustment of the mA and/or kV according to patient size and/or use of iterative reconstruction technique. CONTRAST:  134mL OMNIPAQUE IOHEXOL 300 MG/ML  SOLN COMPARISON:  CT abdomen and pelvis 12/02/2022 FINDINGS: Lower chest: Chronic fibrosis or scarring in the lung bases. No pleural effusion. Cardiomegaly and coronary atherosclerosis. Hepatobiliary: Diffusely decreased attenuation of the liver suggestive of steatosis. Mildly lobular liver contour without definite findings of cirrhosis. Unremarkable gallbladder. No biliary dilatation. Pancreas: Fatty atrophy, greatest in the pancreatic head and neck region. No ductal dilatation or  acute inflammation. Spleen: Unremarkable. Adrenals/Urinary Tract: Mild chronic thickening of the adrenal glands. Bilateral renal atrophy. 18 mm cyst in the interpolar left kidney for which no follow-up imaging is recommended. No renal calculi or hydronephrosis. Unremarkable bladder. Stomach/Bowel: The stomach is unremarkable. There are multiple dilated loops of small bowel with air-fluid levels measuring up to 4.6 cm in diameter in the central abdomen and right upper quadrant. A small bowel loop anteriorly in the right mid abdomen demonstrates wall thickening and hyperenhancement, and adjacent inflammatory changes track into the central mesentery. The distal small bowel is decompressed, with suspected transition in the central abdomen. Numerous small bowel diverticula are again noted. There is also widespread colonic diverticulosis. An annular, a suspected mass in the proximal ascending colon is unchanged. The appendix is unremarkable. Vascular/Lymphatic: Abdominal aortic atherosclerosis without aneurysm. No enlarged lymph nodes. Reproductive: Small or absent prostate. Other: Trace pelvic free fluid. No organized fluid collection. No pneumoperitoneum. Musculoskeletal: No acute osseous abnormality or suspicious osseous lesion. IMPRESSION: 1. Recurrent small bowel obstruction with suspected transition in the central abdomen. Associated inflammation of a small bowel loop in the right mid abdomen. 2. Unchanged suspected mass in the ascending colon. 3.  Aortic Atherosclerosis (ICD10-I70.0). Electronically Signed   By: Logan Bores M.D.   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  He presented with multiple hospital admissions in January/February of this year for diverticulitis and small bowel obstruction.  Found to have a right colon mass on  imaging -Colonoscopy 12/11/22 by Dr. Benson Norway confirmed adenocarcinoma.  Baseline CEA was normal and CT negative for distant metastasis -He underwent lap converted to open right hemicolectomy by Dr. Zenia Resides 12/15/2022 -Reviewed surgical path which showed invasive grade 2-3 adenocarcinoma measuring 6.0 x 4.0 x 2.7 cm with negative lymph nodes and clear margins.  Lymphovascular invasion was present, but no PNI or perforation -There was loss of MLH1 and PMS2, we will request MSI status and other molecular studies to rule out germline mutation -Mr. Mintzer is recovering gradually, feels well except generalized weakness.  Pain is controlled.  Managing bowels with medication -He will see Dr. Zenia Resides later today for post op f/up -We discussed stage II cancer does not require adjuvant chemo.  He will be monitored. His case was discussed in our tumor board, the consensus is surveillance  -Discussed the standard of care is a colonoscopy a year from surgery, at which time he will be 83.  Can discuss with GI at that time -We discussed the risk of cancer recurrence in the future. I discussed the surveillance plan, which is a physical exam and lab test (including CBC, CMP and CEA) every 3 months for the first 2 years, then every 6-12 months, and surveilliance CT scan every 6-12 month for couple years. -Pt agrees with the plan -Pt seen with Dr. Burr Medico -Return for lab and f/up in 3 months   Sigmoid diverticulitis and SBO  -Multiple hospitalizations in Jan- Feb 2024, required drain placement which has been removed -SBO possibly related to adhesions from diverticulitis rather than his R colon cancer -He has recovered   Anemia -Normal CBC In 2023, this is likely secondary to #1 and IDA from blood loss, and possible anemia of chronic disease  -Also on Eliquis for Afib -We recommend prenatal vitamin   CKD, Afib,  HTN, HL, DM -He had AKI in the hospital which improved with fluids -Scr slightly increased today, I encouraged him  to hydrate more -F/up per PCP   Family history/genetics  -Pt has h/o prostate cancer and new colon cancer, and family history of brain and breast cancer -he qualifies for genetic testing with his history alone, but is not interested now -He agrees to discuss with his son and let us know   PLAN: -Multiple hospital stay, imaging, endoscopy, and path reviewed -Colon cancer surveillance -Will request MSI status and further molecular testing  -F/up with Dr. Zenia Resides today as scheduled -Start prenatal or multivitamin daily -Lab and f/up in 3 months  -Pt seen with Dr. Burr Medico    All questions were answered. The patient knows to call the clinic with any problems, questions or concerns.     Alla Feeling, NP 01/06/2023    Addendum I have seen the patient, examined him. I agree with the assessment and and plan and have edited the notes.   Ian Estes is a 82 year old gentleman with multiple medical comorbidities, initially presented with left side bowel perforation from sigmoid diverticulosis, subsequently found to have right-sided colon cancer, status post surgical resection.  She has had multiple hospital admissions and ED visits in past 3 months.  He is recovering slowly from the surgery.  I have reviewed his surgical pathology and molecular testing results with patient and his sister, given stage II disease, his advanced age, MSI high disease, I do not recommend adjuvant chemotherapy.  We discussed colon cancer surveillance, he is agreeable.  He had prostate cancer before and has multiple family member had a cancer, we recommended genetic testing, he will think about it.  Truitt Merle MD 01/06/2023

## 2023-01-11 ENCOUNTER — Other Ambulatory Visit: Payer: Self-pay | Admitting: Nurse Practitioner

## 2023-01-11 ENCOUNTER — Telehealth: Payer: Self-pay

## 2023-01-11 ENCOUNTER — Telehealth: Payer: Self-pay | Admitting: Nurse Practitioner

## 2023-01-11 DIAGNOSIS — C182 Malignant neoplasm of ascending colon: Secondary | ICD-10-CM

## 2023-01-11 NOTE — Telephone Encounter (Signed)
I have attempt to get in contact with Ian Estes to let him know about his CEA being elevated, but no answer and I couldn't leave a VM. I was able to call his sister , and relay the message to her making her aware of the lab results. She asked what was a CEA and I let her know it was results from [facility]. I let her know per Dr. Burr Medico that she wants to repeat in 4-6 weeks. Pt sister verbalize understanding and I let her know if she has any questions to call the office.   Romelo Sciandra M. CMA

## 2023-01-11 NOTE — Telephone Encounter (Signed)
Per 3/25 IB reached out to patient to schedule, voicemail hasn't been setup yet. Will try again.

## 2023-01-18 ENCOUNTER — Ambulatory Visit: Payer: Medicare HMO | Admitting: Cardiology

## 2023-01-20 DIAGNOSIS — F5102 Adjustment insomnia: Secondary | ICD-10-CM | POA: Diagnosis not present

## 2023-01-20 DIAGNOSIS — N183 Chronic kidney disease, stage 3 unspecified: Secondary | ICD-10-CM | POA: Diagnosis not present

## 2023-01-20 DIAGNOSIS — C189 Malignant neoplasm of colon, unspecified: Secondary | ICD-10-CM | POA: Diagnosis not present

## 2023-01-20 DIAGNOSIS — E1122 Type 2 diabetes mellitus with diabetic chronic kidney disease: Secondary | ICD-10-CM | POA: Diagnosis not present

## 2023-01-20 DIAGNOSIS — E119 Type 2 diabetes mellitus without complications: Secondary | ICD-10-CM | POA: Diagnosis not present

## 2023-01-20 DIAGNOSIS — E7800 Pure hypercholesterolemia, unspecified: Secondary | ICD-10-CM | POA: Diagnosis not present

## 2023-01-20 DIAGNOSIS — I4819 Other persistent atrial fibrillation: Secondary | ICD-10-CM | POA: Diagnosis not present

## 2023-01-21 ENCOUNTER — Telehealth: Payer: Self-pay | Admitting: Nurse Practitioner

## 2023-01-21 NOTE — Telephone Encounter (Signed)
Contacted patient to scheduled appointments. Left message with appointment details and a call back number if patient had any questions or could not accommodate the time we provided.   

## 2023-02-09 ENCOUNTER — Other Ambulatory Visit: Payer: Self-pay

## 2023-02-09 ENCOUNTER — Inpatient Hospital Stay: Payer: Medicare HMO | Attending: Nurse Practitioner

## 2023-02-09 DIAGNOSIS — C182 Malignant neoplasm of ascending colon: Secondary | ICD-10-CM | POA: Diagnosis not present

## 2023-02-09 LAB — CEA (ACCESS): CEA (CHCC): 9.31 ng/mL — ABNORMAL HIGH (ref 0.00–5.00)

## 2023-02-10 ENCOUNTER — Other Ambulatory Visit: Payer: Medicare HMO

## 2023-02-11 ENCOUNTER — Other Ambulatory Visit: Payer: Self-pay

## 2023-02-11 ENCOUNTER — Encounter: Payer: Self-pay | Admitting: Nurse Practitioner

## 2023-02-11 NOTE — Progress Notes (Signed)
  Cardiology Office Note:   Date:  02/12/2023  ID:  Ian Estes, DOB 1941/02/29, MRN [id number]  History of Present Illness:   Ian Estes is a 82 y.o. male who presents for evaluation post pericardial window for effusion and atrial fib.  Since I last saw him he was in the hospital with recurrent small bowel obstruction.  He had an adenocarcinoma and had a laparoscopic converted to an open hemicolectomy.  He had no cardiac complications after reviewed his surgical notes.  He went to rehab for the day.  He went back home and he is actually done pretty well.  He is not probably ambulating is much as he could because he has a callus on his foot.  He denies any chest pressure, neck or arm discomfort.  He had no new shortness of breath, PND or orthopnea.  He does not notice his atrial fibrillation.  ROS: As stated in the HPI and negative for all other systems.  Studies Reviewed:    EKG: Atrial fibrillation, rate 55, axis within normal limits, incomplete right bundle branch block, nonspecific ST-T wave changes.   Risk Assessment/Calculations:    CHA2DS2-VASc Score = 2   This indicates a 2.2% annual risk of stroke. The patient's score is based upon: CHF History: 0 HTN History: 0 Diabetes History: 0 Stroke History: 0 Vascular Disease History: 0 Age Score: 2 Gender Score: 0     Physical Exam:   VS:  BP 128/70   Pulse (!) 55   Ht 6\' 1"  (1.854 m)   Wt 207 lb 12.8 oz (94.3 kg)   SpO2 99%   BMI 27.42 kg/m    Wt Readings from Last 3 Encounters:  02/12/23 207 lb 12.8 oz (94.3 kg)  01/06/23 194 lb 4.8 oz (88.1 kg)  12/15/22 197 lb 1.5 oz (89.4 kg)     GEN: Well nourished, well developed in no acute distress NECK: No JVD; No carotid bruits CARDIAC:  IrregularRR, no murmurs, rubs, gallops RESPIRATORY:  Clear to auscultation without rales, wheezing or rhonchi  ABDOMEN: Soft, non-tender, non-distended EXTREMITIES:  Mild leg edema; No deformity , decreased DP/PT bilateral  ASSESSMENT  AND PLAN:   Pericardial Effusion:   I did see his CT when he was in the hospital in February and there was no mention of pericardial fluid.  No further imaging.  Atrial fib: Tolerates anticoagulation good rate control.  No change in therapy.      Signed, Rollene Rotunda, MD

## 2023-02-12 ENCOUNTER — Ambulatory Visit: Payer: Medicare HMO | Attending: Cardiology | Admitting: Cardiology

## 2023-02-12 ENCOUNTER — Encounter: Payer: Self-pay | Admitting: Cardiology

## 2023-02-12 VITALS — BP 128/70 | HR 55 | Ht 73.0 in | Wt 207.8 lb

## 2023-02-12 DIAGNOSIS — I3139 Other pericardial effusion (noninflammatory): Secondary | ICD-10-CM

## 2023-02-12 DIAGNOSIS — I482 Chronic atrial fibrillation, unspecified: Secondary | ICD-10-CM

## 2023-02-12 MED ORDER — APIXABAN 5 MG PO TABS: 5.0000 mg | ORAL_TABLET | Freq: Two times a day (BID) | ORAL | 0 refills | Status: DC

## 2023-02-12 NOTE — Patient Instructions (Signed)
Medication Instructions:   Your physician recommends that you continue on your current medications as directed. Please refer to the Current Medication list given to you today.  *If you need a refill on your cardiac medications before your next appointment, please call your pharmacy*  Lab Work: NONE ordered at this time of appointment   If you have labs (blood work) drawn today and your tests are completely normal, you will receive your results only by: MyChart Message (if you have MyChart) OR A paper copy in the mail If you have any lab test that is abnormal or we need to change your treatment, we will call you to review the results.  Testing/Procedures: NONE ordered at this time of appointment   Follow-Up: At [facility], you and your health needs are our priority.  As part of our continuing mission to provide you with exceptional heart care, we have created designated Provider Care Teams.  These Care Teams include your primary Cardiologist (physician) and Advanced Practice Providers (APPs -  Physician Assistants and Nurse Practitioners) who all work together to provide you with the care you need, when you need it.   Your next appointment:   1 year(s)  Provider:   James Hochrein, MD     Other Instructions   

## 2023-02-22 ENCOUNTER — Encounter: Payer: Self-pay | Admitting: Nurse Practitioner

## 2023-02-22 ENCOUNTER — Inpatient Hospital Stay: Payer: Medicare HMO | Attending: Nurse Practitioner | Admitting: Nurse Practitioner

## 2023-02-22 ENCOUNTER — Telehealth: Payer: Self-pay

## 2023-02-22 DIAGNOSIS — C182 Malignant neoplasm of ascending colon: Secondary | ICD-10-CM | POA: Diagnosis not present

## 2023-02-22 NOTE — Progress Notes (Signed)
Patient Care Team: Richmond Campbell., PA-C as PCP - General (Family Medicine) Rollene Rotunda, MD as PCP - Cardiology (Cardiology) Charna Elizabeth, MD as Consulting Physician (Gastroenterology) Gaynelle Adu, MD as Consulting Physician (General Surgery) Karie Soda, MD as Consulting Physician (Colon and Rectal Surgery) Malachy Mood, MD as Consulting Physician (Hematology and Oncology)    I connected with Ian Estes on 02/22/23 at 12:00 PM EDT by telephone visit and verified that I am speaking with the correct person using two identifiers.   I discussed the limitations, risks, security and privacy concerns of performing an evaluation and management service by telemedicine and the availability of in-person appointments. I also discussed with the patient that there may be a patient responsible charge related to this service. The patient expressed understanding and agreed to proceed.   Other persons participating in the visit and their role in the encounter: None   Patient's location: Home  Provider's location: [facility] office    CHIEF COMPLAINT: Follow-up rising CEA, history of colon cancer    CURRENT THERAPY: Colon cancer surveillance  INTERVAL HISTORY Ian Estes presents by phone to follow-up rising CEA.  Last seen by [place] 01/06/2023 as a new patient.  He has been doing well at home since surgery.  He is eating well and gaining weight.  Energy is okay but he is limited by a bone spur on his foot.  He has constipation, last BM 2 days ago.  He has been supplementing with fiber 3 times a day, has Dulcolax but not taking it.  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"   Diverticulitis    Gout    High cholesterol    Hypertension    Obesity     Pneumonia 2014 X 1   Prostate cancer (HCC)    Type II diabetes mellitus (HCC)      Past Surgical History:  Procedure Laterality Date   BIOPSY  12/11/2022   Procedure: BIOPSY;  Surgeon: Jeani Hawking, MD;  Location: [facility];  Service: Gastroenterology;;   COLON RESECTION N/A 12/15/2022   Procedure: LAPAROSCOPIC HAND ASSISTED RIGHT HEMI COLECTOMY, POSSIBLE OPEN;  Surgeon: Fritzi Mandes, MD;  Location: [facility];  Service: General;  Laterality: N/A;   COLONOSCOPY WITH PROPOFOL N/A 12/11/2022   Procedure: COLONOSCOPY WITH PROPOFOL;  Surgeon: Jeani Hawking, MD;  Location: [facility];  Service: Gastroenterology;  Laterality: N/A;   INCISION AND DRAINAGE PERIRECTAL ABSCESS  1990's   "hemorrhoid"   KNEE ARTHROSCOPY Right ~ 2011   POLYPECTOMY  12/11/2022   Procedure: POLYPECTOMY;  Surgeon: Jeani Hawking, MD;  Location: [facility];  Service: Gastroenterology;;   PROSTATE SURGERY  1996   SKIN CANCER EXCISION  ~ 2005 X 2   "left foot"   SUBMUCOSAL TATTOO INJECTION  12/11/2022   Procedure: SUBMUCOSAL TATTOO INJECTION;  Surgeon: Jeani Hawking, MD;  Location: [facility];  Service: Gastroenterology;;   SUBXYPHOID PERICARDIAL WINDOW N/A 12/21/2018   Procedure: SUBXYPHOID PERICARDIAL WINDOW;  Surgeon: Kerin Perna, MD;  Location: [facility];  Service: Thoracic;  Laterality: N/A;   TEE WITHOUT CARDIOVERSION N/A 12/21/2018   Procedure: TRANSESOPHAGEAL ECHOCARDIOGRAM (TEE);  Surgeon: Donata Clay, Theron Arista, MD;  Location: [facility] OR;  Service: Thoracic;  Laterality: N/A;  Outpatient Encounter Medications as of 02/22/2023  Medication Sig Note   apixaban (ELIQUIS) 5 MG TABS tablet Take 1 tablet (5 mg total) by mouth 2 (two) times daily.    apixaban (ELIQUIS) 5 MG TABS tablet Take 1 tablet (5 mg total) by mouth 2 (two) times daily.    metoprolol succinate (TOPROL-XL) 25 MG 24 hr tablet Take 0.5 tablets (12.5 mg total) by mouth daily. 12/02/2022: The patient's son stated he takes only 12.5 mg/24 hours    polyethylene glycol (MIRALAX / GLYCOLAX) 17 g packet Take 17 g by mouth daily.    senna-docusate (SENOKOT-S) 8.6-50 MG tablet Take 1 tablet by mouth 2 (two) times daily.    TYLENOL 500 MG tablet Take 1,000 mg by mouth every 6 (six) hours as needed for mild pain.    No facility-administered encounter medications on file as of 02/22/2023.     There were no vitals filed for this visit. There is no height or weight on file to calculate BMI.   PHYSICAL EXAM Appears well by phone. Speech is clear, voice is strong. 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  Found to have a right colon mass on imaging -Colonoscopy 12/11/22 by Dr. Elnoria Howard confirmed adenocarcinoma.  Baseline CEA was normal 4.5 and CT negative for distant metastasis -S/p ap converted to open right hemicolectomy by Dr. Freida Busman 12/15/2022, surgical path showed invasive grade 2-3 adenocarcinoma measuring 6.0 x 4.0 x 2.7 cm with negative lymph nodes and clear margins.  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 Has been on surveillance and monitoring rising CEA, 4.5 -- 8.3 now up to 9.3 -Ian Estes appears to be doing well clinically. We reviewed symptom management for constipation. Denis pain, weight loss, or other concerning changes.  -I reviewed rising CEA and why this can be concerning, and other possible causes for rising number. We discussed imaging vs close monitoring -I recommend to proceed with CT to r/o recurrence, he agreed but would like to be done at the end  of the month after his sister's vacation. She is very involved in his care and I spoke with her today too.  -Will do CT in 3 weeks, then lab/f/up few days after to review results.    Sigmoid diverticulitis and SBO  -Multiple hospitalizations in Jan- Feb 2024, required drain placement which has been removed -SBO possibly related to adhesions from diverticulitis rather than his R colon cancer -He has recovered    Anemia -Normal CBC In 2023, this is likely secondary to #1 and IDA from blood loss, and possible anemia of chronic disease  -Also on Eliquis for Afib -We previously recommended prenatal vitamin    CKD, Afib, HTN, HL, DM -He had AKI in the hospital which improved with fluids -Encouraged him to hydrate more -F/up per PCP    Family history/genetics  -Pt has h/o prostate cancer and new colon cancer, and family history of brain and breast cancer -he qualifies for genetic testing with his history alone, but is not interested now -He  agrees to discuss with his son and let us know   PLAN: -Reviewed recent labs and rising CEA -CT CAP in 3 weeks -F/up with Dr. Mosetta Putt few days after to review results    Orders Placed This Encounter  Procedures   CT CHEST ABDOMEN PELVIS W CONTRAST    Standing Status:   Future    Standing Expiration Date:   02/22/2024    Order Specific Question:   If indicated for the ordered procedure, I authorize the administration of contrast media per Radiology protocol    Answer:   Yes    Order Specific Question:   Does the patient have a contrast media/X-ray dye allergy?    Answer:   No    Order Specific Question:   Preferred imaging location?    Answer:   [facility]    Order Specific Question:   If indicated for the ordered procedure, I authorize the administration of oral contrast media per Radiology protocol    Answer:   Yes     I discussed the assessment and treatment plan with the patient. The patient was provided an opportunity to ask  questions and all were answered. The patient agreed with the plan and demonstrated an understanding of the instructions.   The patient was advised to call back or seek an in-person evaluation if the symptoms worsen or if the condition fails to improve as anticipated. All questions were answered. The patient knows to call the clinic with any problems, questions or concerns. No barriers to learning were detected. The total time spent in the appointment was 10 minutes and more than 50% was on counseling, review of test results, and coordination of care.   Santiago Glad, NP-C 02/22/2023

## 2023-02-22 NOTE — Telephone Encounter (Addendum)
Called patient and relayed message below as per Santiago Glad NP. Patient understood and will be awaiting a call from Lacie today at lunch.   ----- Message from Pollyann Samples, NP sent at 02/20/2023  5:15 PM EDT ----- Ian Estes,  I tried calling this pt and sent MyChart message, which has not been read. He had colon cancer surgery, and now rising CEA. Could you please try to reach him. My recommendation is to proceed with CT scan, to r/o recurrence. I could do a phone call with him first to discuss. Ok to schedule during lunch if needed.   Thanks [employer]

## 2023-03-11 DIAGNOSIS — M79674 Pain in right toe(s): Secondary | ICD-10-CM | POA: Diagnosis not present

## 2023-03-11 DIAGNOSIS — L603 Nail dystrophy: Secondary | ICD-10-CM | POA: Diagnosis not present

## 2023-03-11 DIAGNOSIS — M79675 Pain in left toe(s): Secondary | ICD-10-CM | POA: Diagnosis not present

## 2023-03-11 DIAGNOSIS — M79672 Pain in left foot: Secondary | ICD-10-CM | POA: Diagnosis not present

## 2023-03-11 DIAGNOSIS — M79671 Pain in right foot: Secondary | ICD-10-CM | POA: Diagnosis not present

## 2023-03-11 DIAGNOSIS — L84 Corns and callosities: Secondary | ICD-10-CM | POA: Diagnosis not present

## 2023-03-11 DIAGNOSIS — L602 Onychogryphosis: Secondary | ICD-10-CM | POA: Diagnosis not present

## 2023-03-30 ENCOUNTER — Ambulatory Visit (HOSPITAL_COMMUNITY)
Admission: RE | Admit: 2023-03-30 | Discharge: 2023-03-30 | Disposition: A | Discharge status: Home / Self Care | Payer: Medicare HMO | Source: Ambulatory Visit | Attending: Nurse Practitioner | Admitting: Nurse Practitioner

## 2023-03-30 ENCOUNTER — Encounter (HOSPITAL_COMMUNITY): Payer: Self-pay

## 2023-03-30 DIAGNOSIS — N281 Cyst of kidney, acquired: Secondary | ICD-10-CM | POA: Diagnosis not present

## 2023-03-30 DIAGNOSIS — C182 Malignant neoplasm of ascending colon: Secondary | ICD-10-CM | POA: Diagnosis not present

## 2023-03-30 DIAGNOSIS — C189 Malignant neoplasm of colon, unspecified: Secondary | ICD-10-CM | POA: Diagnosis not present

## 2023-03-30 DIAGNOSIS — J90 Pleural effusion, not elsewhere classified: Secondary | ICD-10-CM | POA: Diagnosis not present

## 2023-03-30 DIAGNOSIS — E119 Type 2 diabetes mellitus without complications: Secondary | ICD-10-CM | POA: Insufficient documentation

## 2023-03-30 LAB — POCT I-STAT CREATININE: Creatinine, Ser: 1.2 mg/dL (ref 0.61–1.24)

## 2023-03-30 MED ORDER — IOHEXOL 9 MG/ML PO SOLN
ORAL | Status: AC
  Filled 2023-03-30: qty 1000

## 2023-03-30 MED ORDER — IOHEXOL 300 MG/ML  SOLN
100.0000 mL | Freq: Once | INTRAMUSCULAR | Status: AC | PRN
  Administered 2023-03-30: 100 mL via INTRAVENOUS

## 2023-03-30 MED ORDER — SODIUM CHLORIDE (PF) 0.9 % IJ SOLN
INTRAMUSCULAR | Status: AC
  Filled 2023-03-30: qty 50

## 2023-03-30 MED ORDER — IOHEXOL 9 MG/ML PO SOLN
1000.0000 mL | ORAL | Status: AC
  Administered 2023-03-30: 1000 mL via ORAL

## 2023-04-01 ENCOUNTER — Other Ambulatory Visit: Payer: Self-pay

## 2023-04-01 ENCOUNTER — Inpatient Hospital Stay: Payer: Medicare HMO | Attending: Nurse Practitioner

## 2023-04-01 DIAGNOSIS — R935 Abnormal findings on diagnostic imaging of other abdominal regions, including retroperitoneum: Secondary | ICD-10-CM | POA: Diagnosis not present

## 2023-04-01 DIAGNOSIS — C182 Malignant neoplasm of ascending colon: Secondary | ICD-10-CM | POA: Insufficient documentation

## 2023-04-01 DIAGNOSIS — R970 Elevated carcinoembryonic antigen [CEA]: Secondary | ICD-10-CM | POA: Insufficient documentation

## 2023-04-01 LAB — CMP (CANCER CENTER ONLY)
ALT: 13 U/L (ref 0–44)
AST: 22 U/L (ref 15–41)
Albumin: 3.3 g/dL — ABNORMAL LOW (ref 3.5–5.0)
Alkaline Phosphatase: 136 U/L — ABNORMAL HIGH (ref 38–126)
Anion gap: 6 (ref 5–15)
BUN: 19 mg/dL (ref 8–23)
CO2: 26 mmol/L (ref 22–32)
Calcium: 9 mg/dL (ref 8.9–10.3)
Chloride: 105 mmol/L (ref 98–111)
Creatinine: 1.2 mg/dL (ref 0.61–1.24)
GFR, Estimated: >60 mL/min (ref >60)
Glucose, Bld: 175 mg/dL — ABNORMAL HIGH (ref 70–99)
Potassium: 4.2 mmol/L (ref 3.5–5.1)
Sodium: 137 mmol/L (ref 135–145)
Total Bilirubin: 0.6 mg/dL (ref 0.3–1.2)
Total Protein: 7.3 g/dL (ref 6.5–8.1)

## 2023-04-01 LAB — CBC WITH DIFFERENTIAL (CANCER CENTER ONLY)
Abs Immature Granulocytes: 0.01 10*3/uL (ref 0.00–0.07)
Basophils Absolute: 0 10*3/uL (ref 0.0–0.1)
Basophils Relative: 1 %
Eosinophils Absolute: 0.2 10*3/uL (ref 0.0–0.5)
Eosinophils Relative: 3 %
HCT: 38.2 % — ABNORMAL LOW (ref 39.0–52.0)
Hemoglobin: 11.5 g/dL — ABNORMAL LOW (ref 13.0–17.0)
Immature Granulocytes: 0 %
Lymphocytes Relative: 23 %
Lymphs Abs: 1.1 10*3/uL (ref 0.7–4.0)
MCH: 25.6 pg — ABNORMAL LOW (ref 26.0–34.0)
MCHC: 30.1 g/dL (ref 30.0–36.0)
MCV: 85.1 fL (ref 80.0–100.0)
Monocytes Absolute: 0.6 10*3/uL (ref 0.1–1.0)
Monocytes Relative: 12 %
Neutro Abs: 3 10*3/uL (ref 1.7–7.7)
Neutrophils Relative %: 61 %
Platelet Count: 213 10*3/uL (ref 150–400)
RBC: 4.49 MIL/uL (ref 4.22–5.81)
RDW: 17.1 % — ABNORMAL HIGH (ref 11.5–15.5)
WBC Count: 5 10*3/uL (ref 4.0–10.5)
nRBC: 0 % (ref 0.0–0.2)

## 2023-04-08 ENCOUNTER — Encounter: Payer: Self-pay | Admitting: Hematology

## 2023-04-08 ENCOUNTER — Other Ambulatory Visit: Payer: Self-pay

## 2023-04-08 ENCOUNTER — Inpatient Hospital Stay: Payer: Medicare HMO

## 2023-04-08 ENCOUNTER — Inpatient Hospital Stay (HOSPITAL_BASED_OUTPATIENT_CLINIC_OR_DEPARTMENT_OTHER): Payer: Medicare HMO | Admitting: Hematology

## 2023-04-08 ENCOUNTER — Other Ambulatory Visit: Payer: Medicare HMO

## 2023-04-08 VITALS — BP 136/55 | HR 59 | Temp 97.5°F | Resp 18 | Ht 73.0 in | Wt 208.1 lb

## 2023-04-08 DIAGNOSIS — C182 Malignant neoplasm of ascending colon: Secondary | ICD-10-CM

## 2023-04-08 DIAGNOSIS — Z8546 Personal history of malignant neoplasm of prostate: Secondary | ICD-10-CM | POA: Diagnosis not present

## 2023-04-08 DIAGNOSIS — R970 Elevated carcinoembryonic antigen [CEA]: Secondary | ICD-10-CM | POA: Diagnosis not present

## 2023-04-08 DIAGNOSIS — R935 Abnormal findings on diagnostic imaging of other abdominal regions, including retroperitoneum: Secondary | ICD-10-CM | POA: Diagnosis not present

## 2023-04-08 LAB — CBC WITH DIFFERENTIAL (CANCER CENTER ONLY)
Abs Immature Granulocytes: 0.01 10*3/uL (ref 0.00–0.07)
Basophils Absolute: 0 10*3/uL (ref 0.0–0.1)
Basophils Relative: 1 %
Eosinophils Absolute: 0.2 10*3/uL (ref 0.0–0.5)
Eosinophils Relative: 4 %
HCT: 40.2 % (ref 39.0–52.0)
Hemoglobin: 12.3 g/dL — ABNORMAL LOW (ref 13.0–17.0)
Immature Granulocytes: 0 %
Lymphocytes Relative: 25 %
Lymphs Abs: 1.1 10*3/uL (ref 0.7–4.0)
MCH: 25.9 pg — ABNORMAL LOW (ref 26.0–34.0)
MCHC: 30.6 g/dL (ref 30.0–36.0)
MCV: 84.8 fL (ref 80.0–100.0)
Monocytes Absolute: 1 10*3/uL (ref 0.1–1.0)
Monocytes Relative: 23 %
Neutro Abs: 2.1 10*3/uL (ref 1.7–7.7)
Neutrophils Relative %: 47 %
Platelet Count: 186 10*3/uL (ref 150–400)
RBC: 4.74 MIL/uL (ref 4.22–5.81)
RDW: 16.8 % — ABNORMAL HIGH (ref 11.5–15.5)
WBC Count: 4.3 10*3/uL (ref 4.0–10.5)
nRBC: 0 % (ref 0.0–0.2)

## 2023-04-08 LAB — CEA (IN HOUSE-CHCC): CEA (CHCC-In House): 6.87 ng/mL — ABNORMAL HIGH (ref 0.00–5.00)

## 2023-04-08 LAB — CMP (CANCER CENTER ONLY)
ALT: 14 U/L (ref 0–44)
AST: 26 U/L (ref 15–41)
Albumin: 3.5 g/dL (ref 3.5–5.0)
Alkaline Phosphatase: 136 U/L — ABNORMAL HIGH (ref 38–126)
Anion gap: 6 (ref 5–15)
BUN: 15 mg/dL (ref 8–23)
CO2: 26 mmol/L (ref 22–32)
Calcium: 9.1 mg/dL (ref 8.9–10.3)
Chloride: 106 mmol/L (ref 98–111)
Creatinine: 1.26 mg/dL — ABNORMAL HIGH (ref 0.61–1.24)
GFR, Estimated: 57 mL/min — ABNORMAL LOW (ref >60)
Glucose, Bld: 114 mg/dL — ABNORMAL HIGH (ref 70–99)
Potassium: 4.6 mmol/L (ref 3.5–5.1)
Sodium: 138 mmol/L (ref 135–145)
Total Bilirubin: 0.5 mg/dL (ref 0.3–1.2)
Total Protein: 7.1 g/dL (ref 6.5–8.1)

## 2023-04-08 NOTE — Assessment & Plan Note (Signed)
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  Found to have a right colon mass on imaging -Colonoscopy 12/11/22 by Dr. Elnoria Howard confirmed adenocarcinoma.  Baseline CEA was normal 4.5 and CT negative for distant metastasis -S/p ap converted to open right hemicolectomy by Dr. Freida Busman 12/15/2022, surgical path showed invasive grade 2-3 adenocarcinoma measuring 6.0 x 4.0 x 2.7 cm with negative lymph nodes and clear margins.  Lymphovascular invasion was present, but no PNI or perforation -Due to rising CEA,, he underwent CT scan of abdomen and pelvis with contrast on 11/29/2022, which showed a new mesentery node, concerning for metastatic recurrence.  His enlarged mediastinal and hilar adenopathy are unchanged, likely reactive. -His new mesenteric lymph node is difficult to biopsy, I recommend a PET scan for further evaluation. -I discussed treatment option of immunotherapy, such as Keytruda, due to his MSI high disease.  Due to his advanced age and medical comorbidities, he is not a good candidate for cytotoxic chemotherapy.

## 2023-04-08 NOTE — Progress Notes (Signed)
[facility]   [phone] Fax:([phone]   Clinic Follow up Note   Patient Care Team: Richmond Campbell., PA-C as PCP - General (Family Medicine) Rollene Rotunda, MD as PCP - Cardiology (Cardiology) Charna Elizabeth, MD as Consulting Physician (Gastroenterology) Gaynelle Adu, MD as Consulting Physician (General Surgery) Karie Soda, MD as Consulting Physician (Colon and Rectal Surgery) Malachy Mood, MD as Consulting Physician (Hematology and Oncology)  Date of Service:  04/08/2023  CHIEF COMPLAINT: f/u of rising CEA, history of colon cancer   CURRENT THERAPY:  Cancer Surveillance  ASSESSMENT:  Ian Estes is a 82 y.o. 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  Found to have a right colon mass on imaging -Colonoscopy 12/11/22 by Dr. Elnoria Howard confirmed adenocarcinoma.  Baseline CEA was normal 4.5 and CT negative for distant metastasis -S/p ap converted to open right hemicolectomy by Dr. Freida Busman 12/15/2022, surgical path showed invasive grade 2-3 adenocarcinoma measuring 6.0 x 4.0 x 2.7 cm with negative lymph nodes and clear margins.  Lymphovascular invasion was present, but no PNI or perforation -Due to rising CEA,, he underwent CT scan of abdomen and pelvis with contrast on 11/29/2022, which showed a new mesentery node, concerning for metastatic recurrence.  His enlarged mediastinal and hilar adenopathy are unchanged, likely reactive. -His new mesenteric lymph node is difficult to biopsy, I recommend a PET scan for further evaluation. -I discussed treatment option of immunotherapy if he has recurrence, such as Keytruda, due to his MSI high disease.  Due to his advanced age and medical comorbidities, he is not a good candidate for cytotoxic  chemotherapy.    PLAN: -  I reviewed CT SCAN -I recommend PET scan for further evaluation - I order a PET scan in 1-2 week - lab today for Tumor marker/PSA -f/u with phone visit.  SUMMARY OF ONCOLOGIC HISTORY: Oncology History Overview Note   Cancer Staging  Cancer of right colon [facility]) Staging form: Colon and Rectum, AJCC 8th Edition - Pathologic: Stage IIA (pT3, pN0, cM0) - Signed by Malachy Mood, MD on 04/08/2023 Total positive nodes: 0 Histologic grading system: 4 grade system Histologic grade (G): G2 Residual tumor (R): R0 - None     Cancer of right colon (HCC)  04/08/2023 Initial Diagnosis   Cancer of right colon (HCC)   04/08/2023 Cancer Staging   Staging form: Colon and Rectum, AJCC 8th Edition - Pathologic: Stage IIA (pT3, pN0, cM0) - Signed by Malachy Mood, MD on 04/08/2023 Total positive nodes: 0 Histologic grading system: 4 grade system Histologic grade (G): G2 Residual tumor (R): R0 - None      INTERVAL HISTORY:  Ian Estes is here for a follow up of Colon Cancer. He was last seen by  NP Lacie on 02/22/2023. He presents to the clinic accompanied by wife. Pt state that he is doing well, he denies having any stomach issues. Pt state that he has a hard time getting started to use the bathroom when it comes to BM.        All other systems were reviewed with the patient and are negative.  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"  Diverticulitis    Gout    High cholesterol    Hypertension    Obesity    Pneumonia 2014 X 1   Prostate cancer (HCC)    Type II diabetes mellitus (HCC)     SURGICAL HISTORY: Past Surgical History:  Procedure Laterality Date   BIOPSY  12/11/2022   Procedure: BIOPSY;  Surgeon: Jeani Hawking, MD;  Location: [facility];  Service: Gastroenterology;;   COLON RESECTION N/A 12/15/2022   Procedure:  LAPAROSCOPIC HAND ASSISTED RIGHT HEMI COLECTOMY, POSSIBLE OPEN;  Surgeon: Fritzi Mandes, MD;  Location: [facility];  Service: General;  Laterality: N/A;   COLONOSCOPY WITH PROPOFOL N/A 12/11/2022   Procedure: COLONOSCOPY WITH PROPOFOL;  Surgeon: Jeani Hawking, MD;  Location: [facility];  Service: Gastroenterology;  Laterality: N/A;   INCISION AND DRAINAGE PERIRECTAL ABSCESS  1990's   "hemorrhoid"   KNEE ARTHROSCOPY Right ~ 2011   POLYPECTOMY  12/11/2022   Procedure: POLYPECTOMY;  Surgeon: Jeani Hawking, MD;  Location: [facility];  Service: Gastroenterology;;   PROSTATE SURGERY  1996   SKIN CANCER EXCISION  ~ 2005 X 2   "left foot"   SUBMUCOSAL TATTOO INJECTION  12/11/2022   Procedure: SUBMUCOSAL TATTOO INJECTION;  Surgeon: Jeani Hawking, MD;  Location: [facility];  Service: Gastroenterology;;   SUBXYPHOID PERICARDIAL WINDOW N/A 12/21/2018   Procedure: SUBXYPHOID PERICARDIAL WINDOW;  Surgeon: Kerin Perna, MD;  Location: [facility];  Service: Thoracic;  Laterality: N/A;   TEE WITHOUT CARDIOVERSION N/A 12/21/2018   Procedure: TRANSESOPHAGEAL ECHOCARDIOGRAM (TEE);  Surgeon: Donata Clay, Theron Arista, MD;  Location: [facility];  Service: Thoracic;  Laterality: N/A;    I have reviewed the social history and family history with the patient and they are unchanged from previous note.  ALLERGIES:  has No Known Allergies.  MEDICATIONS:  Current Outpatient Medications  Medication Sig Dispense Refill   apixaban (ELIQUIS) 5 MG TABS tablet Take 1 tablet (5 mg total) by mouth 2 (two) times daily. 180 tablet 1   apixaban (ELIQUIS) 5 MG TABS tablet Take 1 tablet (5 mg total) by mouth 2 (two) times daily. 28 tablet 0   metoprolol succinate (TOPROL-XL) 25 MG 24 hr tablet Take 0.5 tablets (12.5 mg total) by mouth daily.     polyethylene glycol (MIRALAX / GLYCOLAX) 17 g packet Take 17 g by mouth daily. 14 each 0   senna-docusate (SENOKOT-S) 8.6-50 MG tablet Take 1 tablet by mouth 2 (two) times daily. 30 tablet 0   TYLENOL  500 MG tablet Take 1,000 mg by mouth every 6 (six) hours as needed for mild pain.     No current facility-administered medications for this visit.    PHYSICAL EXAMINATION: ECOG PERFORMANCE STATUS: 1 - Symptomatic but completely ambulatory  Vitals:   04/08/23 1108  BP: (!) 136/55  Pulse: (!) 59  Resp: 18  Temp: (!) 97.5 F (36.4 C)  SpO2: 100%   Wt Readings from Last 3 Encounters:  04/08/23 208 lb 1.6 oz (94.4 kg)  02/12/23 207 lb 12.8 oz (94.3 kg)  01/06/23 194 lb 4.8 oz (88.1 kg)     GENERAL:alert, no distress and comfortable SKIN: skin color normal, no rashes or significant lesions EYES: normal, Conjunctiva are pink and non-injected, sclera clear  NEURO: alert & oriented x 3 with fluent speech LABORATORY DATA:  I have reviewed the data as listed    Latest Ref Rng & Units 04/08/2023   12:22 PM 04/01/2023    8:25 AM 01/06/2023   11:09 AM  CBC  WBC 4.0 - 10.5 K/uL 4.3  5.0  6.2   Hemoglobin 13.0 - 17.0 g/dL 40.9  81.1  91.4   Hematocrit 39.0 - 52.0 % 40.2  38.2  33.9   Platelets 150 - 400 K/uL 186  213  356         Latest Ref Rng & Units 04/08/2023   12:22 PM 04/01/2023    8:25 AM 03/30/2023   12:38 PM  CMP  Glucose 70 - 99 mg/dL 782  956    BUN 8 - 23 mg/dL 15  19    Creatinine 2.13 - 1.24 mg/dL 0.86  5.78  4.69   Sodium 135 - 145 mmol/L 138  137    Potassium 3.5 - 5.1 mmol/L 4.6  4.2    Chloride 98 - 111 mmol/L 106  105    CO2 22 - 32 mmol/L 26  26    Calcium 8.9 - 10.3 mg/dL 9.1  9.0    Total Protein 6.5 - 8.1 g/dL 7.1  7.3    Total Bilirubin 0.3 - 1.2 mg/dL 0.5  0.6    Alkaline Phos 38 - 126 U/L 136  136    AST 15 - 41 U/L 26  22    ALT 0 - 44 U/L 14  13        RADIOGRAPHIC STUDIES: I have personally reviewed the radiological images as listed and agreed with the findings in the report. No results found.    Orders Placed This Encounter  Procedures   NM PET Image Restage (PS) Skull Base to Thigh (F-18 FDG)    Standing Status:   Future    Standing  Expiration Date:   04/07/2024    Order Specific Question:   If indicated for the ordered procedure, I authorize the administration of a radiopharmaceutical per Radiology protocol    Answer:   Yes    Order Specific Question:   Preferred imaging location?    Answer:   [facility]    Order Specific Question:   Release to patient    Answer:   Immediate    Order Specific Question:   Radiology Contrast Protocol - do NOT remove file path    Answer:   [email or web address]   CEA (IN HOUSE-[facility])    Standing Status:   Standing    Number of Occurrences:   20    Standing Expiration Date:   04/07/2024   Prostate-Specific AG, Serum    Standing Status:   Standing    Number of Occurrences:   20    Standing Expiration Date:   04/07/2024   All questions were answered. The patient knows to call the clinic with any problems, questions or concerns. No barriers to learning was detected. The total time spent in the appointment was 25 minutes.     Malachy Mood, MD 04/08/2023   Carolin Coy, CMA, am acting as scribe for Malachy Mood, MD.   I have reviewed the above documentation for accuracy and completeness, and I agree with the above.

## 2023-04-09 DIAGNOSIS — C189 Malignant neoplasm of colon, unspecified: Secondary | ICD-10-CM | POA: Diagnosis not present

## 2023-04-09 DIAGNOSIS — K5900 Constipation, unspecified: Secondary | ICD-10-CM | POA: Diagnosis not present

## 2023-04-11 LAB — PROSTATE-SPECIFIC AG, SERUM (LABCORP): Prostate Specific Ag, Serum: <0.1 ng/mL (ref 0.0–4.0)

## 2023-04-21 ENCOUNTER — Telehealth: Payer: Medicare HMO | Admitting: Hematology

## 2023-04-29 ENCOUNTER — Encounter (HOSPITAL_COMMUNITY)
Admission: RE | Admit: 2023-04-29 | Discharge: 2023-04-29 | Disposition: A | Discharge status: Home / Self Care | Payer: Medicare HMO | Source: Ambulatory Visit | Attending: Hematology | Admitting: Hematology

## 2023-04-29 DIAGNOSIS — C182 Malignant neoplasm of ascending colon: Secondary | ICD-10-CM | POA: Diagnosis not present

## 2023-04-29 DIAGNOSIS — C189 Malignant neoplasm of colon, unspecified: Secondary | ICD-10-CM | POA: Diagnosis not present

## 2023-04-29 LAB — GLUCOSE, CAPILLARY: Glucose-Capillary: 84 mg/dL (ref 70–99)

## 2023-04-29 MED ORDER — FLUDEOXYGLUCOSE F - 18 (FDG) INJECTION
10.4000 | Freq: Once | INTRAVENOUS | Status: AC
  Administered 2023-04-29: 10.4 via INTRAVENOUS

## 2023-05-06 ENCOUNTER — Encounter: Payer: Self-pay | Admitting: Hematology

## 2023-05-06 ENCOUNTER — Inpatient Hospital Stay: Payer: Medicare HMO | Attending: Nurse Practitioner | Admitting: Hematology

## 2023-05-06 DIAGNOSIS — C182 Malignant neoplasm of ascending colon: Secondary | ICD-10-CM | POA: Diagnosis not present

## 2023-05-06 NOTE — Progress Notes (Signed)
[facility]   Telephone:([phone] Fax:(336) [phone]   Clinic Follow up Note   Patient Care Team: Richmond Campbell., PA-C as PCP - General (Family Medicine) Rollene Rotunda, MD as PCP - Cardiology (Cardiology) Charna Elizabeth, MD as Consulting Physician (Gastroenterology) Gaynelle Adu, MD as Consulting Physician (General Surgery) Karie Soda, MD as Consulting Physician (Colon and Rectal Surgery) Malachy Mood, MD as Consulting Physician (Hematology and Oncology)  Date of Service:  05/06/2023  I connected with Ian Estes on 05/06/2023 at  8:40 AM EDT by telephone visit and verified that I am speaking with the correct person using two identifiers.  I discussed the limitations, risks, security and privacy concerns of performing an evaluation and management service by telephone and the availability of in person appointments. I also discussed with the patient that there may be a patient responsible charge related to this service. The patient expressed understanding and agreed to proceed.   Other persons participating in the visit and their role in the encounter:  Wife  Patient's location:  HOME Provider's location:  [facility] Office  CHIEF COMPLAINT: f/u of Colon cancer   CURRENT THERAPY:  Surveillance  ASSESSMENT & PLAN:  Ian Estes is a 82 y.o. 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  Found to have a right colon mass on imaging -Colonoscopy 12/11/22 by Dr. Elnoria Howard confirmed adenocarcinoma.  Baseline CEA was normal 4.5 and CT negative for distant metastasis -S/p ap converted to open right hemicolectomy by Dr. Freida Busman 12/15/2022, surgical path showed invasive grade 2-3 adenocarcinoma measuring 6.0 x 4.0 x 2.7 cm with negative lymph nodes and clear margins.   Lymphovascular invasion was present, but no PNI or perforation -Due to rising CEA,, he underwent CT scan of abdomen and pelvis with contrast on 11/29/2022, which showed a new mesentery node, concerning for metastatic recurrence.  His enlarged mediastinal and hilar adenopathy are unchanged, likely reactive. -His new mesenteric lymph node is difficult to biopsy, I recommend a PET scan for further evaluation. -I personally reviewed his PET scan, which showed hypermetabolic mediastinal adenopathy, the size of the adenopathy are similar to his previous CT scan from earlier 2024 and 2022.  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  The mediastinal lymph nodes is not hypermetabolic on the PET.  No definitive evidence of cancer recurrence.  I discussed the above with patient and her his sister in detail.  -I discussed treatment option of immunotherapy if he has recurrence, such as Keytruda, due to his MSI high disease.  Due to his advanced age and medical comorbidities, he is not a good candidate for cytotoxic chemotherapy. -Continue colon cancer surveillance, will see him back in 3 months, plan to repeat a CT scan in 6 months    Plan: -reviewed PET scan with radiologist, and discussed the findings with patient and his sister, his hypermetabolic thoracic adenopathy likely reactive, no other definitive evidence of for cancer recurrence, will continue monitoring.   -repeat CT in 6 months -lab and f/u in 3 months  SUMMARY OF ONCOLOGIC HISTORY: Oncology History Overview Note   Cancer Staging  Cancer of right colon [facility]) Staging form: Colon and Rectum, AJCC 8th Edition - Pathologic: Stage IIA (pT3, pN0,  cM0) - Signed by Malachy Mood, MD on 04/08/2023 Total positive nodes: 0 Histologic grading system: 4 grade system Histologic grade (G): G2 Residual tumor (R): R0 - None     Cancer of right colon  (HCC)  04/08/2023 Initial Diagnosis   Cancer of right colon (HCC)   04/08/2023 Cancer Staging   Staging form: Colon and Rectum, AJCC 8th Edition - Pathologic: Stage IIA (pT3, pN0, cM0) - Signed by Malachy Mood, MD on 04/08/2023 Total positive nodes: 0 Histologic grading system: 4 grade system Histologic grade (G): G2 Residual tumor (R): R0 - None      INTERVAL HISTORY:  Ian Estes was contacted for a follow up of Colon cancer . He was last seen by me on 04/08/2023.    All other systems were reviewed with the patient and are negative.  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"   Diverticulitis    Gout    High cholesterol    Hypertension    Obesity    Pneumonia 2014 X 1   Prostate cancer (HCC)    Type II diabetes mellitus (HCC)     SURGICAL HISTORY: Past Surgical History:  Procedure Laterality Date   BIOPSY  12/11/2022   Procedure: BIOPSY;  Surgeon: Jeani Hawking, MD;  Location: [facility];  Service: Gastroenterology;;   COLON RESECTION N/A 12/15/2022   Procedure: LAPAROSCOPIC HAND ASSISTED RIGHT HEMI COLECTOMY, POSSIBLE OPEN;  Surgeon: Fritzi Mandes, MD;  Location: [facility];  Service: General;  Laterality: N/A;   COLONOSCOPY WITH PROPOFOL N/A 12/11/2022   Procedure: COLONOSCOPY WITH PROPOFOL;  Surgeon: Jeani Hawking, MD;  Location: [facility];  Service: Gastroenterology;  Laterality: N/A;   INCISION AND DRAINAGE PERIRECTAL ABSCESS  1990's   "hemorrhoid"   KNEE ARTHROSCOPY Right ~ 2011   POLYPECTOMY  12/11/2022   Procedure: POLYPECTOMY;  Surgeon: Jeani Hawking, MD;  Location: [facility];  Service: Gastroenterology;;   PROSTATE SURGERY  1996   SKIN CANCER EXCISION  ~ 2005 X 2   "left foot"   SUBMUCOSAL TATTOO INJECTION  12/11/2022   Procedure: SUBMUCOSAL TATTOO INJECTION;  Surgeon: Jeani Hawking, MD;  Location: [facility];  Service:  Gastroenterology;;   SUBXYPHOID PERICARDIAL WINDOW N/A 12/21/2018   Procedure: SUBXYPHOID PERICARDIAL WINDOW;  Surgeon: Kerin Perna, MD;  Location: [facility] OR;  Service: Thoracic;  Laterality: N/A;   TEE WITHOUT CARDIOVERSION N/A 12/21/2018   Procedure: TRANSESOPHAGEAL ECHOCARDIOGRAM (TEE);  Surgeon: Donata Clay, Theron Arista, MD;  Location: [facility] OR;  Service: Thoracic;  Laterality: N/A;    I have reviewed the social history and family history with the patient and they are unchanged from previous note.  ALLERGIES:  has No Known Allergies.  MEDICATIONS:  Current Outpatient Medications  Medication Sig Dispense Refill   apixaban (ELIQUIS) 5 MG TABS tablet Take 1 tablet (5 mg total) by mouth 2 (two) times daily. 180 tablet 1   apixaban (ELIQUIS) 5 MG TABS tablet Take 1 tablet (5 mg total) by mouth 2 (two) times daily. 28 tablet 0   metoprolol succinate (TOPROL-XL) 25 MG 24 hr tablet Take 0.5 tablets (12.5 mg total) by mouth daily.     polyethylene glycol (MIRALAX / GLYCOLAX) 17 g packet Take 17 g by mouth daily. 14 each 0   senna-docusate (SENOKOT-S) 8.6-50 MG tablet Take 1 tablet by mouth 2 (two) times daily. 30  tablet 0   TYLENOL 500 MG tablet Take 1,000 mg by mouth every 6 (six) hours as needed for mild pain.     No current facility-administered medications for this visit.    PHYSICAL EXAMINATION: ECOG PERFORMANCE STATUS: 1 - Symptomatic but completely ambulatory  There were no vitals filed for this visit. Wt Readings from Last 3 Encounters:  04/08/23 208 lb 1.6 oz (94.4 kg)  02/12/23 207 lb 12.8 oz (94.3 kg)  01/06/23 194 lb 4.8 oz (88.1 kg)     No vitals taken today, Exam not performed today  LABORATORY DATA:  I have reviewed the data as listed    Latest Ref Rng & Units 04/08/2023   12:22 PM 04/01/2023    8:25 AM 01/06/2023   11:09 AM  CBC  WBC 4.0 - 10.5 K/uL 4.3  5.0  6.2   Hemoglobin 13.0 - 17.0 g/dL 60.4  54.0  98.1   Hematocrit 39.0 - 52.0 % 40.2  38.2  33.9   Platelets 150 - 400  K/uL 186  213  356         Latest Ref Rng & Units 04/08/2023   12:22 PM 04/01/2023    8:25 AM 03/30/2023   12:38 PM  CMP  Glucose 70 - 99 mg/dL 191  478    BUN 8 - 23 mg/dL 15  19    Creatinine 2.95 - 1.24 mg/dL 6.21  3.08  6.57   Sodium 135 - 145 mmol/L 138  137    Potassium 3.5 - 5.1 mmol/L 4.6  4.2    Chloride 98 - 111 mmol/L 106  105    CO2 22 - 32 mmol/L 26  26    Calcium 8.9 - 10.3 mg/dL 9.1  9.0    Total Protein 6.5 - 8.1 g/dL 7.1  7.3    Total Bilirubin 0.3 - 1.2 mg/dL 0.5  0.6    Alkaline Phos 38 - 126 U/L 136  136    AST 15 - 41 U/L 26  22    ALT 0 - 44 U/L 14  13        RADIOGRAPHIC STUDIES: I have personally reviewed the radiological images as listed and agreed with the findings in the report. No results found.    No orders of the defined types were placed in this encounter.  All questions were answered. The patient knows to call the clinic with any problems, questions or concerns. No barriers to learning was detected. The total time spent in the appointment was 25 minutes.     Malachy Mood, MD 05/06/2023   Carolin Coy am acting as scribe for Malachy Mood, MD.   I have reviewed the above documentation for accuracy and completeness, and I agree with the above.

## 2023-05-07 ENCOUNTER — Telehealth: Payer: Self-pay | Admitting: Hematology

## 2023-05-10 DIAGNOSIS — H2513 Age-related nuclear cataract, bilateral: Secondary | ICD-10-CM | POA: Diagnosis not present

## 2023-05-10 DIAGNOSIS — H524 Presbyopia: Secondary | ICD-10-CM | POA: Diagnosis not present

## 2023-05-17 DIAGNOSIS — Z0100 Encounter for examination of eyes and vision without abnormal findings: Secondary | ICD-10-CM | POA: Diagnosis not present

## 2023-05-31 ENCOUNTER — Other Ambulatory Visit: Payer: Self-pay | Admitting: Cardiology

## 2023-05-31 NOTE — Telephone Encounter (Signed)
Prescription refill request for Eliquis received. Indication:afib Last office visit:4/24 Scr:1.26  6/24 Age: 82 Weight:94.4  kg  Prescription refilled

## 2023-06-03 DIAGNOSIS — I10 Essential (primary) hypertension: Secondary | ICD-10-CM | POA: Diagnosis not present

## 2023-06-03 DIAGNOSIS — M7989 Other specified soft tissue disorders: Secondary | ICD-10-CM | POA: Diagnosis not present

## 2023-06-03 DIAGNOSIS — Z79899 Other long term (current) drug therapy: Secondary | ICD-10-CM | POA: Diagnosis not present

## 2023-07-08 DIAGNOSIS — M25562 Pain in left knee: Secondary | ICD-10-CM | POA: Diagnosis not present

## 2023-07-08 DIAGNOSIS — M174 Other bilateral secondary osteoarthritis of knee: Secondary | ICD-10-CM | POA: Diagnosis not present

## 2023-07-08 DIAGNOSIS — M25561 Pain in right knee: Secondary | ICD-10-CM | POA: Diagnosis not present

## 2023-07-08 DIAGNOSIS — G8929 Other chronic pain: Secondary | ICD-10-CM | POA: Diagnosis not present

## 2023-07-12 DIAGNOSIS — I10 Essential (primary) hypertension: Secondary | ICD-10-CM | POA: Diagnosis not present

## 2023-07-12 DIAGNOSIS — M7989 Other specified soft tissue disorders: Secondary | ICD-10-CM | POA: Diagnosis not present

## 2023-07-27 ENCOUNTER — Telehealth: Payer: Self-pay

## 2023-07-27 NOTE — Telephone Encounter (Signed)
Patient Advocate Encounter  Patient was approved to receive ELIQUIS from [facility]  Effective 07/26/23 to 10/19/23  Haze Rushing, CPhT Pharmacy Patient Advocate Specialist Direct Number: [phone] Fax: [phone]

## 2023-07-28 ENCOUNTER — Telehealth: Payer: Self-pay | Admitting: Nurse Practitioner

## 2023-07-29 ENCOUNTER — Other Ambulatory Visit: Payer: Medicare HMO

## 2023-07-29 ENCOUNTER — Ambulatory Visit: Payer: Medicare HMO | Admitting: Hematology

## 2023-08-03 DIAGNOSIS — C189 Malignant neoplasm of colon, unspecified: Secondary | ICD-10-CM | POA: Diagnosis not present

## 2023-09-02 ENCOUNTER — Inpatient Hospital Stay: Payer: Medicare HMO | Attending: Hematology

## 2023-09-02 ENCOUNTER — Inpatient Hospital Stay: Payer: Medicare HMO | Admitting: Hematology

## 2023-09-02 ENCOUNTER — Telehealth: Payer: Self-pay

## 2023-09-02 DIAGNOSIS — C182 Malignant neoplasm of ascending colon: Secondary | ICD-10-CM | POA: Insufficient documentation

## 2023-09-02 DIAGNOSIS — Z9049 Acquired absence of other specified parts of digestive tract: Secondary | ICD-10-CM | POA: Diagnosis not present

## 2023-09-02 DIAGNOSIS — N189 Chronic kidney disease, unspecified: Secondary | ICD-10-CM | POA: Diagnosis not present

## 2023-09-02 DIAGNOSIS — M25569 Pain in unspecified knee: Secondary | ICD-10-CM | POA: Diagnosis not present

## 2023-09-02 DIAGNOSIS — G8929 Other chronic pain: Secondary | ICD-10-CM | POA: Diagnosis not present

## 2023-09-02 DIAGNOSIS — Z8546 Personal history of malignant neoplasm of prostate: Secondary | ICD-10-CM | POA: Insufficient documentation

## 2023-09-02 DIAGNOSIS — R970 Elevated carcinoembryonic antigen [CEA]: Secondary | ICD-10-CM | POA: Insufficient documentation

## 2023-09-02 LAB — CMP (CANCER CENTER ONLY)
ALT: 22 U/L (ref 0–44)
AST: 32 U/L (ref 15–41)
Albumin: 3.9 g/dL (ref 3.5–5.0)
Alkaline Phosphatase: 110 U/L (ref 38–126)
Anion gap: 7 (ref 5–15)
BUN: 21 mg/dL (ref 8–23)
CO2: 27 mmol/L (ref 22–32)
Calcium: 9.7 mg/dL (ref 8.9–10.3)
Chloride: 103 mmol/L (ref 98–111)
Creatinine: 1.3 mg/dL — ABNORMAL HIGH (ref 0.61–1.24)
GFR, Estimated: 55 mL/min — ABNORMAL LOW (ref >60)
Glucose, Bld: 91 mg/dL (ref 70–99)
Potassium: 4.7 mmol/L (ref 3.5–5.1)
Sodium: 137 mmol/L (ref 135–145)
Total Bilirubin: 1 mg/dL (ref <1.2)
Total Protein: 7.9 g/dL (ref 6.5–8.1)

## 2023-09-02 LAB — CBC WITH DIFFERENTIAL (CANCER CENTER ONLY)
Abs Immature Granulocytes: 0.02 10*3/uL (ref 0.00–0.07)
Basophils Absolute: 0.1 10*3/uL (ref 0.0–0.1)
Basophils Relative: 1 %
Eosinophils Absolute: 0.1 10*3/uL (ref 0.0–0.5)
Eosinophils Relative: 2 %
HCT: 49.5 % (ref 39.0–52.0)
Hemoglobin: 16.6 g/dL (ref 13.0–17.0)
Immature Granulocytes: 0 %
Lymphocytes Relative: 20 %
Lymphs Abs: 1.4 10*3/uL (ref 0.7–4.0)
MCH: 29.8 pg (ref 26.0–34.0)
MCHC: 33.5 g/dL (ref 30.0–36.0)
MCV: 88.9 fL (ref 80.0–100.0)
Monocytes Absolute: 0.7 10*3/uL (ref 0.1–1.0)
Monocytes Relative: 10 %
Neutro Abs: 4.7 10*3/uL (ref 1.7–7.7)
Neutrophils Relative %: 67 %
Platelet Count: 181 10*3/uL (ref 150–400)
RBC: 5.57 MIL/uL (ref 4.22–5.81)
RDW: 18.9 % — ABNORMAL HIGH (ref 11.5–15.5)
WBC Count: 6.9 10*3/uL (ref 4.0–10.5)
nRBC: 0 % (ref 0.0–0.2)

## 2023-09-02 LAB — CEA (IN HOUSE-CHCC): CEA (CHCC-In House): 10.1 ng/mL — ABNORMAL HIGH (ref 0.00–5.00)

## 2023-09-02 NOTE — Telephone Encounter (Signed)
Spoke with pt's sister regarding where the pt is located w/in the [facility].  Dr. Latanya Maudlin NT has gone to the lobby numerous times looking for the pt after the pt was arrived to Dr. Latanya Maudlin appt.  Pt's sister stated pt got tired of waiting so they left and went home.  Stated this nurse will cancel the appt scheduled for today.

## 2023-09-02 NOTE — Assessment & Plan Note (Deleted)
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  Found to have a right colon mass on imaging -Colonoscopy 12/11/22 by Dr. Elnoria Howard confirmed adenocarcinoma.  Baseline CEA was normal 4.5 and CT negative for distant metastasis -S/p ap converted to open right hemicolectomy by Dr. Freida Busman 12/15/2022, surgical path showed invasive grade 2-3 adenocarcinoma measuring 6.0 x 4.0 x 2.7 cm with negative lymph nodes and clear margins.  Lymphovascular invasion was present, but no PNI or perforation -Due to rising CEA,, he underwent CT scan of abdomen and pelvis with contrast on 11/29/2022, which showed a new mesentery node, concerning for metastatic recurrence.  His enlarged mediastinal and hilar adenopathy are unchanged, likely reactive. -His new mesenteric lymph node is difficult to biopsy, I recommend a PET scan for further evaluation. -reviewed PET scan with radiologist, and discussed the findings with patient and his sister, his hypermetabolic thoracic adenopathy likely reactive, no other definitive evidence of for cancer recurrence, will continue monitoring.   -I discussed treatment option of immunotherapy, such as Keytruda, due to his MSI high disease.  Due to his advanced age and medical comorbidities, he is not a good candidate for cytotoxic chemotherapy.

## 2023-09-03 LAB — PROSTATE-SPECIFIC AG, SERUM (LABCORP): Prostate Specific Ag, Serum: <0.1 ng/mL (ref 0.0–4.0)

## 2023-09-07 ENCOUNTER — Telehealth: Payer: Self-pay | Admitting: Hematology

## 2023-09-08 NOTE — Assessment & Plan Note (Signed)
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  Found to have a right colon mass on imaging -Colonoscopy 12/11/22 by Dr. Elnoria Howard confirmed adenocarcinoma.  Baseline CEA was normal 4.5 and CT negative for distant metastasis -S/p ap converted to open right hemicolectomy by Dr. Freida Busman 12/15/2022, surgical path showed invasive grade 2-3 adenocarcinoma measuring 6.0 x 4.0 x 2.7 cm with negative lymph nodes and clear margins.  Lymphovascular invasion was present, but no PNI or perforation -Due to rising CEA,, he underwent CT scan of abdomen and pelvis with contrast on 11/29/2022, which showed a new mesentery node, concerning for metastatic recurrence.  His enlarged mediastinal and hilar adenopathy are unchanged, likely reactive. -His new mesenteric lymph node is difficult to biopsy, I recommend a PET scan for further evaluation. -reviewed PET scan with radiologist, and discussed the findings with patient and his sister, his hypermetabolic thoracic adenopathy likely reactive, no other definitive evidence of for cancer recurrence, will continue monitoring.   -I discussed treatment option of immunotherapy, such as Keytruda, due to his MSI high disease.  Due to his advanced age and medical comorbidities, he is not a good candidate for cytotoxic chemotherapy. -continue surveillance

## 2023-09-09 ENCOUNTER — Inpatient Hospital Stay (HOSPITAL_BASED_OUTPATIENT_CLINIC_OR_DEPARTMENT_OTHER): Payer: Medicare HMO | Admitting: Hematology

## 2023-09-09 ENCOUNTER — Encounter: Payer: Self-pay | Admitting: Hematology

## 2023-09-09 DIAGNOSIS — C182 Malignant neoplasm of ascending colon: Secondary | ICD-10-CM

## 2023-09-09 NOTE — Progress Notes (Signed)
[facility]   Telephone:(336) [phone] Fax:(336) [phone]   Clinic Follow up Note   Patient Care Team: Richmond Campbell., PA-C as PCP - General (Family Medicine) Rollene Rotunda, MD as PCP - Cardiology (Cardiology) Charna Elizabeth, MD as Consulting Physician (Gastroenterology) Gaynelle Adu, MD as Consulting Physician (General Surgery) Karie Soda, MD as Consulting Physician (Colon and Rectal Surgery) Malachy Mood, MD as Consulting Physician (Hematology and Oncology) 09/09/2023  I connected with Twanna Hy on 09/09/23 at  9:00 AM EST by telephone and verified that I am speaking with the correct person using two identifiers.   I discussed the limitations, risks, security and privacy concerns of performing an evaluation and management service by telephone and the availability of in person appointments. I also discussed with the patient that there may be a patient responsible charge related to this service. The patient expressed understanding and agreed to proceed.   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  Found to have a right colon mass on imaging -Colonoscopy 12/11/22 by Dr. Elnoria Howard confirmed adenocarcinoma.  Baseline CEA was normal 4.5 and CT negative for distant metastasis -S/p ap converted to open right hemicolectomy by Dr. Freida Busman 12/15/2022, surgical path showed invasive grade 2-3 adenocarcinoma measuring 6.0 x 4.0 x 2.7 cm with negative lymph nodes and clear margins.  Lymphovascular invasion was present, but no PNI or perforation -Due to rising CEA,, he underwent CT scan of abdomen and pelvis with contrast on 11/29/2022, which  showed a new mesentery node, concerning for metastatic recurrence.  His enlarged mediastinal and hilar adenopathy are unchanged, likely reactive. -His new mesenteric lymph node is difficult to biopsy, I recommend a PET scan for further evaluation. -reviewed PET scan with radiologist, and discussed the findings with patient and his sister, his hypermetabolic thoracic adenopathy likely reactive, no other definitive evidence of for cancer recurrence, will continue monitoring.   -I discussed treatment option of immunotherapy, such as Keytruda, due to his MSI high disease.  Due to his advanced age and medical comorbidities, he is not a good candidate for cytotoxic chemotherapy. -continue surveillance   Assessment and Plan    Colon Cancer Tumor marker elevated to 10 from 6.8 five months ago. Previous PET scan in July showed a positive mesenteric lymph node, biopsy not possible. No current symptoms. Discussed further imaging and blood tests for recurrence monitoring. Explained Segniterra and Guardian tests for blood cancer cell detection, results in two weeks. Patient prefers early appointments and agreed to home draw for Segniterra test. - Schedule CT scan with contrast for the second week of January - Order Segniterra blood test in December with home draw - Schedule follow-up visit in mid-January to review results  Chronic Kidney Disease Slightly elevated creatinine, similar to five months ago but higher than earlier this year. Advised to increase fluid intake to improve kidney function. - Encourage increased fluid intake, including water, tea, Gatorade, or juice  Knee Pain Chronic knee pain, likely due to osteoarthritis. Receiving injections from an orthopedic surgeon.  General Health Maintenance Hemoglobin back to normal at 16.6. Kidney and liver functions mostly fine.  Follow-up - Schedule lab and CT scan with contrast for the second week of January -  Nurse to call to set up Segniterra blood  test in December - Follow-up visit in mid-January to review all results.         SUMMARY OF ONCOLOGIC HISTORY: Oncology History Overview Note   Cancer Staging  Cancer of right colon [facility]) Staging form: Colon and Rectum, AJCC 8th Edition - Pathologic: Stage IIA (pT3, pN0, cM0) - Signed by Malachy Mood, MD on 04/08/2023 Total positive nodes: 0 Histologic grading system: 4 grade system Histologic grade (G): G2 Residual tumor (R): R0 - None     Cancer of right colon (HCC)  04/08/2023 Initial Diagnosis   Cancer of right colon (HCC)   04/08/2023 Cancer Staging   Staging form: Colon and Rectum, AJCC 8th Edition - Pathologic: Stage IIA (pT3, pN0, cM0) - Signed by Malachy Mood, MD on 04/08/2023 Total positive nodes: 0 Histologic grading system: 4 grade system Histologic grade (G): G2 Residual tumor (R): R0 - None     Discussed the use of AI scribe software for clinical note transcription with the patient, who gave verbal consent to proceed.  History of Present Illness   The patient, an 82 year old man with a history of colon cancer, reports feeling "pretty good" overall. He mentions issues with his knees, which he attributes to wear and tear rather than arthritis. He has been receiving shots for his knee pain from an orthopedic surgeon. He denies any stomach issues and reports that his bowel movements have improved since starting on a laxative. He admits to not drinking enough fluids and agrees to increase his intake. He lives about 45 miles away from the clinic and drives himself to appointments.         REVIEW OF SYSTEMS:   Constitutional: Denies fevers, chills or abnormal weight loss Eyes: Denies blurriness of vision Ears, nose, mouth, throat, and face: Denies mucositis or sore throat Respiratory: Denies cough, dyspnea or wheezes Cardiovascular: Denies palpitation, chest discomfort or lower extremity swelling Gastrointestinal:  Denies nausea, heartburn or change in bowel  habits Skin: Denies abnormal skin rashes Lymphatics: Denies new lymphadenopathy or easy bruising Neurological:Denies numbness, tingling or new weaknesses Behavioral/Psych: Mood is stable, no new changes  All other systems were reviewed with the patient and are negative.  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"   Diverticulitis    Gout    High cholesterol    Hypertension    Obesity    Pneumonia 2014 X 1   Prostate cancer (HCC)    Type II diabetes mellitus (HCC)     SURGICAL HISTORY: Past Surgical History:  Procedure Laterality Date   BIOPSY  12/11/2022   Procedure: BIOPSY;  Surgeon: Jeani Hawking, MD;  Location: [facility];  Service: Gastroenterology;;   COLON RESECTION N/A 12/15/2022   Procedure: LAPAROSCOPIC HAND ASSISTED RIGHT HEMI COLECTOMY, POSSIBLE OPEN;  Surgeon: Fritzi Mandes, MD;  Location: [facility];  Service: General;  Laterality: N/A;   COLONOSCOPY WITH PROPOFOL N/A 12/11/2022   Procedure: COLONOSCOPY WITH PROPOFOL;  Surgeon: Jeani Hawking, MD;  Location: [facility];  Service: Gastroenterology;  Laterality: N/A;   INCISION AND DRAINAGE PERIRECTAL ABSCESS  1990's   "hemorrhoid"   KNEE ARTHROSCOPY Right ~ 2011   POLYPECTOMY  12/11/2022   Procedure: POLYPECTOMY;  Surgeon: Jeani Hawking, MD;  Location: [facility];  Service: Gastroenterology;;   PROSTATE SURGERY  1996  SKIN CANCER EXCISION  ~ 2005 X 2   "left foot"   SUBMUCOSAL TATTOO INJECTION  12/11/2022   Procedure: SUBMUCOSAL TATTOO INJECTION;  Surgeon: Jeani Hawking, MD;  Location: [facility];  Service: Gastroenterology;;   SUBXYPHOID PERICARDIAL WINDOW N/A 12/21/2018   Procedure: SUBXYPHOID PERICARDIAL WINDOW;  Surgeon: Kerin Perna, MD;  Location: [facility] OR;  Service: Thoracic;  Laterality: N/A;   TEE WITHOUT CARDIOVERSION N/A 12/21/2018   Procedure: TRANSESOPHAGEAL  ECHOCARDIOGRAM (TEE);  Surgeon: Donata Clay, Theron Arista, MD;  Location: [facility] OR;  Service: Thoracic;  Laterality: N/A;    I have reviewed the social history and family history with the patient and they are unchanged from previous note.  ALLERGIES:  has No Known Allergies.  MEDICATIONS:  Current Outpatient Medications  Medication Sig Dispense Refill   apixaban (ELIQUIS) 5 MG TABS tablet Take 1 tablet (5 mg total) by mouth 2 (two) times daily. 28 tablet 0   ELIQUIS 5 MG TABS tablet TAKE 1 TABLET BY MOUTH TWICE A DAY 60 tablet 5   metoprolol succinate (TOPROL-XL) 25 MG 24 hr tablet Take 0.5 tablets (12.5 mg total) by mouth daily.     polyethylene glycol (MIRALAX / GLYCOLAX) 17 g packet Take 17 g by mouth daily. 14 each 0   senna-docusate (SENOKOT-S) 8.6-50 MG tablet Take 1 tablet by mouth 2 (two) times daily. 30 tablet 0   TYLENOL 500 MG tablet Take 1,000 mg by mouth every 6 (six) hours as needed for mild pain.     No current facility-administered medications for this visit.    PHYSICAL EXAMINATION: Not performed   LABORATORY DATA:  I have reviewed the data as listed    Latest Ref Rng & Units 09/02/2023   10:36 AM 04/08/2023   12:22 PM 04/01/2023    8:25 AM  CBC  WBC 4.0 - 10.5 K/uL 6.9  4.3  5.0   Hemoglobin 13.0 - 17.0 g/dL 47.8  29.5  62.1   Hematocrit 39.0 - 52.0 % 49.5  40.2  38.2   Platelets 150 - 400 K/uL 181  186  213         Latest Ref Rng & Units 09/02/2023   10:36 AM 04/08/2023   12:22 PM 04/01/2023    8:25 AM  CMP  Glucose 70 - 99 mg/dL 91  308  657   BUN 8 - 23 mg/dL 21  15  19    Creatinine 0.61 - 1.24 mg/dL 8.46  9.62  9.52   Sodium 135 - 145 mmol/L 137  138  137   Potassium 3.5 - 5.1 mmol/L 4.7  4.6  4.2   Chloride 98 - 111 mmol/L 103  106  105   CO2 22 - 32 mmol/L 27  26  26    Calcium 8.9 - 10.3 mg/dL 9.7  9.1  9.0   Total Protein 6.5 - 8.1 g/dL 7.9  7.1  7.3   Total Bilirubin <1.2 mg/dL 1.0  0.5  0.6   Alkaline Phos 38 - 126 U/L 110  136  136   AST 15 - 41 U/L  32  26  22   ALT 0 - 44 U/L 22  14  13        RADIOGRAPHIC STUDIES: I have personally reviewed the radiological images as listed and agreed with the findings in the report. No results found.     I discussed the assessment and treatment plan with the patient. The patient was provided an opportunity to ask questions and all  were answered. The patient agreed with the plan and demonstrated an understanding of the instructions.   The patient was advised to call back or seek an in-person evaluation if the symptoms worsen or if the condition fails to improve as anticipated.  I provided 21 minutes of non face-to-face telephone visit time during this encounter, and > 50% was spent counseling as documented under my assessment & plan.     Malachy Mood, MD 09/09/23

## 2023-09-10 ENCOUNTER — Other Ambulatory Visit: Payer: Self-pay

## 2023-09-10 DIAGNOSIS — C182 Malignant neoplasm of ascending colon: Secondary | ICD-10-CM

## 2023-09-10 NOTE — Progress Notes (Signed)
Verbal order w/readback from Dr. Mosetta Putt for [facility] w/mobile phlebotomy.  Order placed and pt called (spoke with pt's sister) & provided the telephone number to call to schedule his appt for mobile phlebotomy.  Fax confirmation received.

## 2023-09-13 ENCOUNTER — Other Ambulatory Visit: Payer: Self-pay

## 2023-09-22 DIAGNOSIS — C182 Malignant neoplasm of ascending colon: Secondary | ICD-10-CM | POA: Diagnosis not present

## 2023-09-23 DIAGNOSIS — G8929 Other chronic pain: Secondary | ICD-10-CM | POA: Diagnosis not present

## 2023-09-23 DIAGNOSIS — L84 Corns and callosities: Secondary | ICD-10-CM | POA: Diagnosis not present

## 2023-09-23 DIAGNOSIS — M79675 Pain in left toe(s): Secondary | ICD-10-CM | POA: Diagnosis not present

## 2023-09-23 DIAGNOSIS — L603 Nail dystrophy: Secondary | ICD-10-CM | POA: Diagnosis not present

## 2023-09-23 DIAGNOSIS — M79672 Pain in left foot: Secondary | ICD-10-CM | POA: Diagnosis not present

## 2023-09-23 DIAGNOSIS — M79671 Pain in right foot: Secondary | ICD-10-CM | POA: Diagnosis not present

## 2023-09-23 DIAGNOSIS — L602 Onychogryphosis: Secondary | ICD-10-CM | POA: Diagnosis not present

## 2023-09-23 DIAGNOSIS — M79674 Pain in right toe(s): Secondary | ICD-10-CM | POA: Diagnosis not present

## 2023-10-05 ENCOUNTER — Telehealth: Payer: Self-pay | Admitting: Cardiology

## 2023-10-05 DIAGNOSIS — Z0000 Encounter for general adult medical examination without abnormal findings: Secondary | ICD-10-CM | POA: Diagnosis not present

## 2023-10-05 DIAGNOSIS — I10 Essential (primary) hypertension: Secondary | ICD-10-CM | POA: Diagnosis not present

## 2023-10-05 DIAGNOSIS — E119 Type 2 diabetes mellitus without complications: Secondary | ICD-10-CM | POA: Diagnosis not present

## 2023-10-05 DIAGNOSIS — E7800 Pure hypercholesterolemia, unspecified: Secondary | ICD-10-CM | POA: Diagnosis not present

## 2023-10-05 DIAGNOSIS — Z125 Encounter for screening for malignant neoplasm of prostate: Secondary | ICD-10-CM | POA: Diagnosis not present

## 2023-10-05 NOTE — Telephone Encounter (Signed)
Patient dropped off assistance form to be filled out In providers box

## 2023-10-06 DIAGNOSIS — E1122 Type 2 diabetes mellitus with diabetic chronic kidney disease: Secondary | ICD-10-CM | POA: Diagnosis not present

## 2023-10-06 DIAGNOSIS — I129 Hypertensive chronic kidney disease with stage 1 through stage 4 chronic kidney disease, or unspecified chronic kidney disease: Secondary | ICD-10-CM | POA: Diagnosis not present

## 2023-10-06 DIAGNOSIS — Z0000 Encounter for general adult medical examination without abnormal findings: Secondary | ICD-10-CM | POA: Diagnosis not present

## 2023-10-06 DIAGNOSIS — N1831 Chronic kidney disease, stage 3a: Secondary | ICD-10-CM | POA: Diagnosis not present

## 2023-10-06 DIAGNOSIS — M109 Gout, unspecified: Secondary | ICD-10-CM | POA: Diagnosis not present

## 2023-10-06 DIAGNOSIS — C189 Malignant neoplasm of colon, unspecified: Secondary | ICD-10-CM | POA: Diagnosis not present

## 2023-10-06 DIAGNOSIS — Z79899 Other long term (current) drug therapy: Secondary | ICD-10-CM | POA: Diagnosis not present

## 2023-10-06 DIAGNOSIS — M25562 Pain in left knee: Secondary | ICD-10-CM | POA: Diagnosis not present

## 2023-10-06 DIAGNOSIS — G4700 Insomnia, unspecified: Secondary | ICD-10-CM | POA: Diagnosis not present

## 2023-10-06 DIAGNOSIS — E785 Hyperlipidemia, unspecified: Secondary | ICD-10-CM | POA: Diagnosis not present

## 2023-10-06 DIAGNOSIS — Z87891 Personal history of nicotine dependence: Secondary | ICD-10-CM | POA: Diagnosis not present

## 2023-10-06 DIAGNOSIS — M25561 Pain in right knee: Secondary | ICD-10-CM | POA: Diagnosis not present

## 2023-10-08 NOTE — Telephone Encounter (Signed)
Spoke with pt sister, she is going to send all of their information to the company and ask that we go ahead and fax our part to the company. Provider portion faxed to [employer].

## 2023-10-16 LAB — SIGNATERA ONLY (NATERA MANAGED)
SIGNATERA MTM READOUT: 0 MTM/ml
SIGNATERA TEST RESULT: NEGATIVE

## 2023-10-21 ENCOUNTER — Telehealth: Payer: Self-pay

## 2023-10-21 NOTE — Telephone Encounter (Addendum)
 Called patient to relay message below as per Dr. Lanny, spoke with the patients sister, she voiced full understanding and has no further concerns or questions at this time.   ----- Message from Onita Lanny sent at 10/21/2023  7:41 AM EST ----- Please let pt know the negative results, thx   Onita Lanny

## 2023-10-27 ENCOUNTER — Encounter (HOSPITAL_COMMUNITY): Payer: Self-pay | Admitting: Hematology

## 2023-10-27 NOTE — Progress Notes (Signed)
 Appointment 11/04/2023

## 2023-10-30 ENCOUNTER — Ambulatory Visit (HOSPITAL_COMMUNITY): Payer: Medicare HMO

## 2023-11-04 ENCOUNTER — Ambulatory Visit: Payer: Medicare HMO | Admitting: Hematology

## 2023-11-04 ENCOUNTER — Other Ambulatory Visit: Payer: Medicare HMO

## 2023-11-06 ENCOUNTER — Ambulatory Visit (HOSPITAL_COMMUNITY)
Admission: RE | Admit: 2023-11-06 | Discharge: 2023-11-06 | Disposition: A | Discharge status: Home / Self Care | Payer: Medicare HMO | Source: Ambulatory Visit | Attending: Hematology | Admitting: Hematology

## 2023-11-06 DIAGNOSIS — C182 Malignant neoplasm of ascending colon: Secondary | ICD-10-CM | POA: Insufficient documentation

## 2023-11-06 DIAGNOSIS — C189 Malignant neoplasm of colon, unspecified: Secondary | ICD-10-CM | POA: Diagnosis not present

## 2023-11-06 DIAGNOSIS — J8410 Pulmonary fibrosis, unspecified: Secondary | ICD-10-CM | POA: Diagnosis not present

## 2023-11-06 DIAGNOSIS — I700 Atherosclerosis of aorta: Secondary | ICD-10-CM | POA: Diagnosis not present

## 2023-11-06 MED ORDER — IOHEXOL 300 MG/ML  SOLN
30.0000 mL | Freq: Once | INTRAMUSCULAR | Status: AC | PRN
  Administered 2023-11-06: 30 mL via ORAL

## 2023-11-06 MED ORDER — IOHEXOL 300 MG/ML  SOLN
100.0000 mL | Freq: Once | INTRAMUSCULAR | Status: AC | PRN
  Administered 2023-11-06: 100 mL via INTRAVENOUS

## 2023-11-15 ENCOUNTER — Other Ambulatory Visit: Payer: Self-pay | Admitting: Nurse Practitioner

## 2023-11-15 DIAGNOSIS — C182 Malignant neoplasm of ascending colon: Secondary | ICD-10-CM

## 2023-11-15 NOTE — Assessment & Plan Note (Addendum)
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  Found to have a right colon mass on imaging -Colonoscopy 12/11/22 by Dr. Elnoria Howard confirmed adenocarcinoma.  Baseline CEA was normal 4.5 and CT negative for distant metastasis -S/p ap converted to open right hemicolectomy by Dr. Freida Busman 12/15/2022, surgical path showed invasive grade 2-3 adenocarcinoma measuring 6.0 x 4.0 x 2.7 cm with negative lymph nodes and clear margins.  Lymphovascular invasion was present, but no PNI or perforation -Due to rising CEA,, he underwent CT scan of abdomen and pelvis with contrast on 11/29/2022, which showed a new mesentery node, concerning for metastatic recurrence.  His enlarged mediastinal and hilar adenopathy are unchanged, likely reactive. -His new mesenteric lymph node is difficult to biopsy, A PET scan was recommended for further evaluation. -reviewed PET scan with radiologist, and discussed the findings with patient and his sister, his hypermetabolic thoracic adenopathy likely reactive, no other definitive evidence of for cancer recurrence, will continue monitoring.   -the treatment option of immunotherapy, such as Keytruda, due to his MSI high disease.  Due to his advanced age and medical comorbidities, he is not a good candidate for cytotoxic chemotherapy. -signaterra test negative CT CAP done 11/06/2023 showing resolution of mesenteric lymph nodes. Stability and slight improvement of mediastinal and hilar lymph nodes. No evidence of new lymphadenopathy or metastatic disease in chest, abdomen, or pelvis.  -repeat CT CAP in one year.  -follow up colonoscopy recommended to be done in 11/2023. Will attach Dr. Loreta Ave to progress note to schedule with patient  -surveillance labs and follow up with patient in 4 months.

## 2023-11-15 NOTE — Progress Notes (Addendum)
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  Found to have a right colon mass on imaging -Colonoscopy 12/11/22 by Dr. Elnoria Howard confirmed adenocarcinoma.  Baseline CEA was normal 4.5 and CT negative for distant metastasis -S/p ap converted to open right hemicolectomy by Dr. Freida Busman 12/15/2022, surgical path showed invasive grade 2-3 adenocarcinoma measuring 6.0 x 4.0 x 2.7 cm with negative lymph nodes and clear margins.  Lymphovascular invasion was present, but no PNI or perforation -Due to rising CEA,, he underwent CT scan of abdomen and pelvis with contrast on 11/29/2022, which showed a new mesentery node, concerning for metastatic recurrence.  His enlarged mediastinal and hilar adenopathy are unchanged, likely reactive. -His new mesenteric lymph node is difficult to biopsy, A PET scan was recommended for further evaluation. -reviewed PET scan with radiologist, and discussed the findings with patient and his sister, his hypermetabolic thoracic adenopathy likely reactive, no other definitive evidence of for cancer recurrence, will continue monitoring.   -the treatment option of immunotherapy, such as Keytruda, due to his MSI high disease.  Due to his advanced age and medical comorbidities, he is not a good candidate for  cytotoxic chemotherapy. -signaterra test negative CT CAP done 11/06/2023 showing resolution of mesenteric lymph nodes. Stability and slight improvement of mediastinal and hilar lymph nodes. No evidence of new lymphadenopathy or metastatic disease in chest, abdomen, or pelvis.  -repeat CT CAP in one year.  -follow up colonoscopy recommended to be done in 11/2023. Will attach Dr. Loreta Ave to progress note to schedule with patient  -surveillance labs and follow up with patient in 4 months.    Plan: Labs reviewed  -CBC showing WBC 6.8; Hgb 16.0; Hct 48.8; Plt 146; Anc 4.6 -CMP - K 4.7; glucose 94; BUN 19; Creatinine 1.33; eGFR 53; Ca 9.3; LFTs normal -signaterra test negative  -cea pending  -psa pending -reviewed CT CAP showing resolved mesenteric lymph nodes and diminished hilar and mediastinal nodes. There was no new evidence of lymphadenopathy or metastatic disease in chest, abdomen, or pelvis.  There is some fibrotic changes in the lungs which are stable, consistent with mild pulmonary fibrosis. He is asymptomatic.  -due to have repeat colonoscopy. Dr. Loreta Ave has done routine colonoscopies for him, prior to diagnosis of colon cancer. Will cc her this note. Patient advised he does need to have colonoscopy despite negative CT CAP. He voiced understanding.  -labs with follow up 4 months.  -repeat CT CAP in one year.  Patient seen along with Dr. Mosetta Putt.  .   The patient understands the plans discussed today and is in agreement with them.  He knows to contact our office if he develops concerns prior to his next appointment.  I provided 25 minutes of face-to-face time during this encounter and > 50% was spent counseling as documented under my assessment and plan.    Carlean Jews, NP  [facility]  [facility] - A DEPT OF [facility] [address] [place] [address] Dept: [phone] Dept Fax: [phone]   Orders Placed This Encounter   Procedures   CT CHEST ABDOMEN PELVIS W CONTRAST    Standing Status:   Future    Expected Date:   10/15/2024    Expiration Date:   11/15/2024    If indicated for the ordered procedure, I authorize the administration of contrast media per Radiology protocol:   Yes    Does the patient have a contrast media/X-ray dye allergy?:   No    Preferred imaging location?:   [facility]    If indicated for the ordered procedure, I authorize the administration of oral contrast media per Radiology protocol:   Yes      CHIEF COMPLAINT:  CC: Cancer right colon  Current Treatment: Surveillance  INTERVAL HISTORY:  Ian Estes is here today for repeat clinical assessment.  He was last seen by Dr. Mosetta Putt on 09/09/2023.  CT of the chest abdomen pelvis with contrast done on 11/06/2023.  This shows status post partial right hemicolectomy and ileocolic anastomosis.  Previously noted enlarged mesenteric lymph nodes have resolved.  Slightly diminished prominence of mediastinal and hilar lymph nodes.  There is no evidence of new lymphadenopathy or metastatic disease in the chest, abdomen, or pelvis. He denies chest pain, chest pressure, or shortness of breath. He denies headaches or visual disturbances. He denies abdominal pain, nausea, vomiting, or changes in bowel or bladder habits. He denies abdominal pain or blood in stool.  He denies fevers or chills. He denies pain. His appetite is good. His weight has increased 7 pounds over last 6 months .  I have reviewed the past medical history, past surgical history, social history and family history with the patient and they are unchanged from previous note.  ALLERGIES:  has no known allergies.  MEDICATIONS:  Current Outpatient Medications  Medication Sig Dispense Refill   apixaban (ELIQUIS) 5 MG TABS tablet Take 1 tablet (5 mg total) by mouth 2 (two) times daily. 28 tablet 0   ELIQUIS 5 MG TABS tablet TAKE 1 TABLET BY MOUTH TWICE A DAY 60 tablet 5   metoprolol  succinate (TOPROL-XL) 25 MG 24 hr tablet Take 0.5 tablets (12.5 mg total) by mouth daily.     polyethylene glycol (MIRALAX / GLYCOLAX) 17 g packet Take 17 g by mouth daily. 14 each 0   senna-docusate (SENOKOT-S) 8.6-50 MG tablet Take 1 tablet by mouth 2 (two) times daily. 30 tablet 0   TYLENOL 500 MG tablet Take 1,000 mg by mouth every 6 (six) hours as needed for mild pain.     No current facility-administered medications for this visit.    HISTORY OF PRESENT ILLNESS:   Oncology History Overview Note   Cancer Staging  Cancer of right colon [facility]) Staging form: Colon and Rectum, AJCC 8th Edition - Pathologic: Stage IIA (pT3, pN0, cM0) - Signed by Malachy Mood, MD on 04/08/2023 Total positive nodes: 0 Histologic grading system: 4 grade system Histologic grade (G): G2 Residual tumor (R): R0 - None     Cancer of right colon (HCC)  04/08/2023 Initial Diagnosis   Cancer of right colon (HCC)   04/08/2023 Cancer Staging   Staging form: Colon and Rectum, AJCC 8th Edition - Pathologic: Stage IIA (pT3, pN0, cM0) - Signed by Malachy Mood, MD on 04/08/2023 Total positive nodes: 0 Histologic grading system: 4 grade system Histologic  grade (G): G2 Residual tumor (R): R0 - None   11/06/2023 Imaging   CT Chest abdomen and pelvis with contrast IMPRESSION: 1. Status post partial right hemicolectomy and ileocolic anastomosis. 2. Previously noted enlarged mesenteric lymph nodes are resolved. Similarly enlarged portacaval lymph node. 3. Slightly diminished prominence of mediastinal and hilar lymph nodes. Above-noted lymph nodes were not previously FDG avid. 4. No evidence of new lymphadenopathy or metastatic disease in the chest, abdomen, or pelvis. 5. 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 6. Cardiomegaly and coronary artery disease.       REVIEW OF SYSTEMS:   Constitutional: Denies fevers, chills or abnormal weight loss Eyes: Denies blurriness of vision Ears, nose, mouth,  throat, and face: Denies mucositis or sore throat Respiratory: Denies cough, dyspnea or wheezes Cardiovascular: Denies palpitation, chest discomfort or lower extremity swelling Gastrointestinal:  Denies nausea, heartburn or change in bowel habits Skin: Denies abnormal skin rashes Lymphatics: Denies new lymphadenopathy or easy bruising Neurological:Denies numbness, tingling or new weaknesses Behavioral/Psych: Mood is stable, no new changes  All other systems were reviewed with the patient and are negative.   VITALS:   Today's Vitals   11/16/23 0800 11/16/23 0852  BP:  (!) 147/63  Pulse:  (!) 51  Resp:  18  SpO2:  90%  Weight:  215 lb (97.5 kg)  PainSc: 0-No pain    Body mass index is 28.37 kg/m.   Wt Readings from Last 3 Encounters:  11/16/23 215 lb (97.5 kg)  04/08/23 208 lb 1.6 oz (94.4 kg)  02/12/23 207 lb 12.8 oz (94.3 kg)    Body mass index is 28.37 kg/m.  Performance status (ECOG): 0 - Asymptomatic  PHYSICAL EXAM:   GENERAL:alert, no distress and comfortable SKIN: skin color, texture, turgor are normal, no rashes or significant lesions EYES: normal, Conjunctiva are pink and non-injected, sclera clear OROPHARYNX:no exudate, no erythema and lips, buccal mucosa, and tongue normal  NECK: supple, thyroid normal size, non-tender, without nodularity LYMPH:  no palpable lymphadenopathy in the cervical, axillary or inguinal LUNGS: clear to auscultation and percussion with normal breathing effort HEART: regular rate & rhythm and no murmurs and no lower extremity edema ABDOMEN:abdomen soft, non-tender and normal bowel sounds Musculoskeletal:no cyanosis of digits and no clubbing  NEURO: alert & oriented x 3 with fluent speech, no focal motor/sensory deficits  LABORATORY DATA:  I have reviewed the data as listed    Component Value Date/Time   NA 137 11/16/2023 0758   NA 138 01/12/2019 0852   K 4.7 11/16/2023 0758   CL 103 11/16/2023 0758   CO2 26 11/16/2023 0758    GLUCOSE 94 11/16/2023 0758   BUN 19 11/16/2023 0758   BUN 20 01/12/2019 0852   CREATININE 1.33 (H) 11/16/2023 0758   CALCIUM 9.3 11/16/2023 0758   PROT 7.0 11/16/2023 0758   ALBUMIN 3.7 11/16/2023 0758   AST 28 11/16/2023 0758   ALT 27 11/16/2023 0758   ALKPHOS 84 11/16/2023 0758   BILITOT 0.8 11/16/2023 0758   GFRNONAA 53 (L) 11/16/2023 0758   GFRAA 63 01/12/2019 0852   Lab Results  Component Value Date   WBC 6.8 11/16/2023   NEUTROABS 4.6 11/16/2023   HGB 16.0 11/16/2023   HCT 48.8 11/16/2023   MCV 97.2 11/16/2023   PLT 146 (L) 11/16/2023     RADIOGRAPHIC STUDIES: CT CHEST ABDOMEN PELVIS W CONTRAST Result Date: 11/07/2023 CLINICAL DATA:  Colon cancer restaging, status post right hemicolectomy * Tracking Code: BO *  EXAM: CT CHEST, ABDOMEN, AND PELVIS WITH CONTRAST TECHNIQUE: Multidetector CT imaging of the chest, abdomen and pelvis was performed following the standard protocol during bolus administration of intravenous contrast. RADIATION DOSE REDUCTION: This exam was performed according to the departmental dose-optimization program which includes automated exposure control, adjustment of the mA and/or kV according to patient size and/or use of iterative reconstruction technique. CONTRAST:  30mL OMNIPAQUE IOHEXOL 300 MG/ML SOLN, 30mL OMNIPAQUE IOHEXOL 300 MG/ML SOLN, OMNIPAQUE IOHEXOL 300 MG/ML SOLN additional oral enteric contrast COMPARISON:  03/30/2023 FINDINGS: CT CHEST FINDINGS Cardiovascular: Aortic atherosclerosis. Mild cardiomegaly. Three-vessel coronary artery calcifications. No pericardial effusion. Mediastinum/Nodes: Slightly diminished prominence of mediastinal and hilar lymph nodes, index subcarinal node measuring 2.0 x 1.2 cm, previously 2.7 x 1.7 cm (series 4, image 36). Thyroid gland, trachea, and esophagus demonstrate no significant findings. Lungs/Pleura: Unchanged mild pulmonary fibrosis in a pattern with apical to basal gradient, featuring irregular peripheral  interstitial opacity, septal thickening, traction bronchiectasis, and small areas of subpleural bronchiolectasis of the lung bases. No pleural effusion or pneumothorax. Musculoskeletal: No chest wall abnormality. No acute osseous findings. CT ABDOMEN PELVIS FINDINGS Hepatobiliary: No solid liver abnormality is seen. No gallstones, gallbladder wall thickening, or biliary dilatation. Pancreas: Unremarkable. No pancreatic ductal dilatation or surrounding inflammatory changes. Spleen: Normal in size without significant abnormality. Adrenals/Urinary Tract: Adrenal glands are unremarkable. Kidneys are normal, without renal calculi, solid lesion, or hydronephrosis. Bladder is unremarkable. Stomach/Bowel: Stomach is within normal limits. Status post partial right hemicolectomy and ileocolic anastomosis. No evidence of bowel wall thickening, distention, or inflammatory changes. Vascular/Lymphatic: Aortic atherosclerosis. Similarly enlarged portacaval lymph node measuring 1.8 x 1.2 cm (series 4, image 64). Previously noted enlarged mesenteric lymph nodes are resolved (series 4, image 82) Reproductive: Status post prostatectomy. Other: Small fat containing bilateral inguinal hernias. No ascites. Musculoskeletal: No acute osseous findings. IMPRESSION: 1. Status post partial right hemicolectomy and ileocolic anastomosis. 2. Previously noted enlarged mesenteric lymph nodes are resolved. Similarly enlarged portacaval lymph node. 3. Slightly diminished prominence of mediastinal and hilar lymph nodes. Above-noted lymph nodes were not previously FDG avid. 4. No evidence of new lymphadenopathy or metastatic disease in the chest, abdomen, or pelvis. 5. 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 6. Cardiomegaly and coronary artery disease. Aortic Atherosclerosis (ICD10-I70.0). Electronically Signed   By: Jearld Lesch M.D.   On: 11/07/2023 07:39   Addendum I have seen the patient, examined him. I agree with the  assessment and and plan and have edited the notes.   Pt is clinically doing well.  I reviewed his restaging CT scan from November 06, 2023.  There is no definitive evidence of cancer recurrence, previously enlarged mesenteric and mediastinal lymph nodes have resolved, likely benign.  Will continue cancer surveillance.  All questions were answered.  I spent a total of 25 minutes for his visit today, more than 50% time on face-to-face counseling.  Malachy Mood MD 12/17/2023

## 2023-11-16 ENCOUNTER — Inpatient Hospital Stay (HOSPITAL_BASED_OUTPATIENT_CLINIC_OR_DEPARTMENT_OTHER): Payer: Medicare HMO | Admitting: Nurse Practitioner

## 2023-11-16 ENCOUNTER — Inpatient Hospital Stay: Payer: Medicare HMO | Attending: Hematology

## 2023-11-16 VITALS — BP 147/63 | HR 51 | Resp 18 | Wt 215.0 lb

## 2023-11-16 DIAGNOSIS — Z8546 Personal history of malignant neoplasm of prostate: Secondary | ICD-10-CM | POA: Diagnosis not present

## 2023-11-16 DIAGNOSIS — Z9049 Acquired absence of other specified parts of digestive tract: Secondary | ICD-10-CM | POA: Insufficient documentation

## 2023-11-16 DIAGNOSIS — C182 Malignant neoplasm of ascending colon: Secondary | ICD-10-CM | POA: Diagnosis not present

## 2023-11-16 DIAGNOSIS — R970 Elevated carcinoembryonic antigen [CEA]: Secondary | ICD-10-CM | POA: Insufficient documentation

## 2023-11-16 LAB — CMP (CANCER CENTER ONLY)
ALT: 27 U/L (ref 0–44)
AST: 28 U/L (ref 15–41)
Albumin: 3.7 g/dL (ref 3.5–5.0)
Alkaline Phosphatase: 84 U/L (ref 38–126)
Anion gap: 8 (ref 5–15)
BUN: 19 mg/dL (ref 8–23)
CO2: 26 mmol/L (ref 22–32)
Calcium: 9.3 mg/dL (ref 8.9–10.3)
Chloride: 103 mmol/L (ref 98–111)
Creatinine: 1.33 mg/dL — ABNORMAL HIGH (ref 0.61–1.24)
GFR, Estimated: 53 mL/min — ABNORMAL LOW (ref >60)
Glucose, Bld: 94 mg/dL (ref 70–99)
Potassium: 4.7 mmol/L (ref 3.5–5.1)
Sodium: 137 mmol/L (ref 135–145)
Total Bilirubin: 0.8 mg/dL (ref 0.0–1.2)
Total Protein: 7 g/dL (ref 6.5–8.1)

## 2023-11-16 LAB — CBC WITH DIFFERENTIAL (CANCER CENTER ONLY)
Abs Immature Granulocytes: 0.04 10*3/uL (ref 0.00–0.07)
Basophils Absolute: 0 10*3/uL (ref 0.0–0.1)
Basophils Relative: 0 %
Eosinophils Absolute: 0.1 10*3/uL (ref 0.0–0.5)
Eosinophils Relative: 1 %
HCT: 48.8 % (ref 39.0–52.0)
Hemoglobin: 16 g/dL (ref 13.0–17.0)
Immature Granulocytes: 1 %
Lymphocytes Relative: 20 %
Lymphs Abs: 1.3 10*3/uL (ref 0.7–4.0)
MCH: 31.9 pg (ref 26.0–34.0)
MCHC: 32.8 g/dL (ref 30.0–36.0)
MCV: 97.2 fL (ref 80.0–100.0)
Monocytes Absolute: 0.7 10*3/uL (ref 0.1–1.0)
Monocytes Relative: 10 %
Neutro Abs: 4.6 10*3/uL (ref 1.7–7.7)
Neutrophils Relative %: 68 %
Platelet Count: 146 10*3/uL — ABNORMAL LOW (ref 150–400)
RBC: 5.02 MIL/uL (ref 4.22–5.81)
RDW: 13.7 % (ref 11.5–15.5)
WBC Count: 6.8 10*3/uL (ref 4.0–10.5)
nRBC: 0 % (ref 0.0–0.2)

## 2023-11-16 LAB — CEA (ACCESS): CEA (CHCC): 10.74 ng/mL — ABNORMAL HIGH (ref 0.00–5.00)

## 2023-11-17 LAB — PROSTATE-SPECIFIC AG, SERUM (LABCORP): Prostate Specific Ag, Serum: <0.1 ng/mL (ref 0.0–4.0)

## 2023-12-13 ENCOUNTER — Telehealth: Payer: Self-pay | Admitting: Cardiology

## 2023-12-13 NOTE — Telephone Encounter (Signed)
 He has a $250 deductible, then his cost would be ~$130 because he has to pay 25% He wont be approved for pt assistance until he has spent 3% of household income on OOP rx expense.   I called and reviewed with sister (per DPR)  We also discussed warfarin and dabigatran. She will discuss with her brother and will let us know.

## 2023-12-13 NOTE — Telephone Encounter (Signed)
 Pt c/o medication issue:  1. Name of Medication: ELIQUIS 5 MG TABS tablet   2. How are you currently taking this medication (dosage and times per day)? As prescribed   3. Are you having a reaction (difficulty breathing--STAT)? No   4. What is your medication issue? Ian Estes denied pt's application due to his income this year, but sister states it is the same. She reports the medication cost $340 for a 2 month supply which he cannot afford. She sent this information to [facility] and they still denied him. She is requesting an alternative med be prescribed. Please advise.

## 2023-12-16 DIAGNOSIS — C182 Malignant neoplasm of ascending colon: Secondary | ICD-10-CM | POA: Diagnosis not present

## 2023-12-24 LAB — SIGNATERA
SIGNATERA MTM READOUT: 0 MTM/ml
SIGNATERA TEST RESULT: NEGATIVE

## 2023-12-28 ENCOUNTER — Telehealth: Payer: Self-pay | Admitting: Cardiology

## 2023-12-28 DIAGNOSIS — K5900 Constipation, unspecified: Secondary | ICD-10-CM | POA: Diagnosis not present

## 2023-12-28 DIAGNOSIS — Z85038 Personal history of other malignant neoplasm of large intestine: Secondary | ICD-10-CM | POA: Diagnosis not present

## 2023-12-28 DIAGNOSIS — I10 Essential (primary) hypertension: Secondary | ICD-10-CM | POA: Diagnosis not present

## 2023-12-28 DIAGNOSIS — E119 Type 2 diabetes mellitus without complications: Secondary | ICD-10-CM | POA: Diagnosis not present

## 2023-12-28 DIAGNOSIS — Z1211 Encounter for screening for malignant neoplasm of colon: Secondary | ICD-10-CM | POA: Diagnosis not present

## 2023-12-28 NOTE — Telephone Encounter (Signed)
   Pre-operative Risk Assessment    Patient Name: Ian Estes  DOB: February 14, 1941 MRN: [id number]   Date of last office visit: 02/12/2023  Date of next office visit: none   Request for Surgical Clearance    Procedure:   colonoscopy  Date of Surgery:  Clearance 01/26/24                                Surgeon:  Dr, Loreta Ave Surgeon's Group or Practice Name:  [facility] Phone number:  [phone] Fax number:  [phone]   Type of Clearance Requested:   - Medical  - Pharmacy:  Hold Apixaban (Eliquis) need instruction   Type of Anesthesia:   propofol   Additional requests/questions:    Sharen Hones   12/28/2023, 12:11 PM

## 2023-12-29 NOTE — Telephone Encounter (Signed)
Left message to call back and schedule in office appt for pre op clearance.

## 2023-12-29 NOTE — Telephone Encounter (Signed)
   Name: Ian Estes  DOB: [birth year]  MRN: [id number]  Primary Cardiologist: Rollene Rotunda, MD  Chart reviewed as part of pre-operative protocol coverage. Because of [name] past medical history and time since last visit, he will require a follow-up in-office visit in order to better assess preoperative cardiovascular risk.  Pre-op covering staff: - Please schedule appointment and call patient to inform them. If patient already had an upcoming appointment within acceptable timeframe, please add "pre-op clearance" to the appointment notes so provider is aware. - Please contact requesting surgeon's office via preferred method (i.e, phone, fax) to inform them of need for appointment prior to surgery.  This message will also be routed to pharmacy pool for input on holding Eliquis as requested below so that this information is available to the clearing provider at time of patient's appointment.   Denyce Robert, NP  12/29/2023, 10:53 AM

## 2023-12-29 NOTE — Telephone Encounter (Signed)
 Patient with diagnosis of atrial fibrillation on Eliquis for anticoagulation.    Procedure:   colonoscopy   Date of Surgery:  Clearance 01/26/24   CHA2DS2-VASc Score = 2   This indicates a 2.2% annual risk of stroke. The patient's score is based upon: CHF History: 0 HTN History: 0 Diabetes History: 0 Stroke History: 0 Vascular Disease History: 0 Age Score: 2 Gender Score: 0    CrCl 58 Platelet count 146  Per office protocol, patient can hold Eliquis for 2 days prior to procedure.   Patient will not need bridging with Lovenox (enoxaparin) around procedure.  **This guidance is not considered finalized until pre-operative APP has relayed final recommendations.**

## 2023-12-30 NOTE — Telephone Encounter (Signed)
 Pt scheduled to see Azalee Course, PA-C, 01/11/24, clearance will be addressed at that time.  Will route to the requesting surgeon's office to make them aware.

## 2024-01-11 ENCOUNTER — Encounter: Payer: Self-pay | Admitting: Physician Assistant

## 2024-01-11 ENCOUNTER — Ambulatory Visit: Attending: Physician Assistant | Admitting: Physician Assistant

## 2024-01-11 VITALS — BP 128/66 | HR 49 | Ht 72.0 in | Wt 210.8 lb

## 2024-01-11 DIAGNOSIS — Z9889 Other specified postprocedural states: Secondary | ICD-10-CM

## 2024-01-11 DIAGNOSIS — I4811 Longstanding persistent atrial fibrillation: Secondary | ICD-10-CM | POA: Diagnosis not present

## 2024-01-11 DIAGNOSIS — I10 Essential (primary) hypertension: Secondary | ICD-10-CM | POA: Diagnosis not present

## 2024-01-11 DIAGNOSIS — Z01818 Encounter for other preprocedural examination: Secondary | ICD-10-CM

## 2024-01-11 NOTE — Progress Notes (Unsigned)
 Cardiology Office Note:  .   Date:  01/13/2024  ID:  Ian Estes, DOB January 07, 1941, MRN [id number] PCP: Richmond Campbell., PA-C  [facility] Providers Cardiologist:  Rollene Rotunda, MD     History of Present Illness: .   Ian Estes is a 83 y.o. male with past medical history of prostate  CA, HTN, HLD, gout, longstanding persistent atrial fibrillation and pericardial effusion s/p pericardial window in March 2020.  Patient was admitted in March 2020 with chest discomfort.  He was found to be in newly recognized atrial fibrillation of unknown duration.  Chest x-ray showed elevation of the left hemidiaphragm.  CTA PE protocol showed a moderate pericardial effusion, aortic and coronary atherosclerosis but no PE.  A subsequent echocardiogram showed EF 60 to 65%, normal RV function, large pleural effusion, large pericardial effusion with large thrombus anterior to the RV, cannot rule out a prominent fat pad, however there was free-flowing fluid both anterior and posterior making thrombus more likely.  There was no evidence of RA or RV diastolic collapse but the IVC was dilated.  Finding concerning for early tamponade.  Patient underwent subxiphoid pericardial window by Dr Donata Clay on 12/21/2018.  Segmental pericardial tissue removed was minimally inflamed.  No thrombus was identified.  Post procedure, he was prophylactically placed on colchicine for possible pericarditis is 1 the etiology behind the large pericardial effusion.  A-fib was felt to be secondary to pericardial effusion.  Eliquis was eventually added on follow-up on 01/03/2019.  He had multiple hospitalization in January and February 2024 due to diverticulitis with contained perforation/abscess and intestinal obstruction.  Hospital course complicated by perforated diverticulum.  He was also found to have a right colon mass and a small bowel obstruction secondary to intra-abdominal adhesions.  He underwent open right hemicolectomy with  ileocolic reanastomosis and lysis of adhesion by Dr. Sophronia Simas on 12/15/2022.  Patient presents today for preoperative clearance prior to colonoscopy procedure.  He denies any chest pain or shortness of breath.  He is in longstanding persistent atrial fibrillation.  Heart rate is borderline low at 49 bpm.  Talking with the patient, instead of half a tablet of metoprolol succinate, he has been taking a whole tablet.  I recommend he continue with a half a tablet of metoprolol succinate instead.  He has no lower extremity edema, orthopnea or PND.  His EKG shows chronic T wave inversion in the anterolateral leads and incomplete right bundle branch block.  This is unchanged when compared to the previous EKG from last year.  Given lack of any chest pain or shortness of breath, I spoke with our DOD, the patient does not need any ischemic workup unless symptomatic especially since previous echocardiogram showed normal EF.  Otherwise, he can follow-up with Dr. Antoine Poche in 1 year.  ROS:   He denies chest pain, palpitations, dyspnea, pnd, orthopnea, n, v, dizziness, syncope, edema, weight gain, or early satiety. All other systems reviewed and are otherwise negative except as noted above.    Studies Reviewed: Marland Kitchen   EKG Interpretation Date/Time:  Tuesday January 11 2024 08:33:47 EDT Ventricular Rate:  49 PR Interval:    QRS Duration:  90 QT Interval:  520 QTC Calculation: 469 R Axis:   -4  Text Interpretation: Atrial fibrillation with T wave inversion in the anterolateral leads Confirmed by Azalee Course [phone]) on 01/13/2024 1:16:27 PM    Cardiac Studies & Procedures   ______________________________________________________________________________________________     ECHOCARDIOGRAM  ECHOCARDIOGRAM COMPLETE 12/20/2018  Narrative  ECHOCARDIOGRAM REPORT    Patient Name:   Ian Estes Date of Exam: 12/20/2018 Medical Rec #:  [id number]      Height:       72.0 in Accession #:    [id number]     Weight:        237.0 lb Date of Birth:  Jan 29, 1941      BSA:          2.29 m Patient Age:    77 years       BP:           131/82 mmHg Patient Gender: M              HR:           81 bpm. Exam Location:  Inpatient   Procedure: 2D Echo  Indications:    Chest Pain R07.9  History:        Patient has no prior history of Echocardiogram examinations. Risk Factors: Hypertension, Diabetes, Former Smoker and Dyslipidemia.  Sonographer:    Thurman Coyer RDCS (AE) Referring Phys: [id number] JENNIFER YATES  IMPRESSIONS   1. The left ventricle has normal systolic function with an ejection fraction of 60-65%. The cavity size was normal. Left ventricular diastolic Doppler parameters are indeterminate secondary to atrial fibrillation. 2. The right ventricle has normal systolic function. The cavity was normal. There is no increase in right ventricular wall thickness. 3. Large pleural effusion. 4. There is excessive respiratory variation in the tricuspid valve spectral Doppler velocities. 5. Large pericardial effusion measuring up to 2.5 cm anteriorly. There appears to be a large thrombus anterior to RV. Cannot rule out a prominent fat pad. However, there is free-flowing fluid both anterior and posterior, making thrombus more likely. There is no evidence of RA or RV diastolic collapse, but the IVC is dilated and does not collapse. There is significant tricuspid valve inflow variation. Mitral inflow pattern is unremarkable. Findings concerning for early tamponade. 6. The mitral valve is normal in structure. 7. The tricuspid valve is normal in structure. 8. The aortic valve was not well visualized. 9. The pulmonic valve was normal in structure. 10. The inferior vena cava was dilated in size with <50% respiratory variability.  FINDINGS Left Ventricle: The left ventricle has normal systolic function, with an ejection fraction of 60-65%. The cavity size was normal. There is no increase in left ventricular wall thickness.  Left ventricular diastolic Doppler parameters are indeterminate secondary to atrial fibrillation. Right Ventricle: The right ventricle has normal systolic function. The cavity was normal. There is no increase in right ventricular wall thickness. Left Atrium: left atrial size was normal in size Right Atrium: right atrial size was normal in size. Right atrial pressure is estimated at 15 mmHg. Interatrial Septum: No atrial level shunt detected by color flow Doppler. Pericardium: There is no evidence of pericardial effusion. There is excessive respiratory variation in the tricuspid valve spectral Doppler velocities. Large pericardial effusion measuring up to 2.5 cm anteriorly. There appears to be a large thrombus anterior to RV. Cannot rule out a prominent fat pad. However, there is free-flowing fluid both anterior and posterior, making thrombus more likely. There is no evidence of RA or RV diastolic collapse, but the IVC is dilated and does not collapse. There is significant tricuspid valve inflow variation. Mitral inflow pattern is unremarkable. Findings concerning for early tamponade. There is a large pleural effusion in either the left or right lateral region. Mitral Valve: The mitral valve is normal in structure.  Mitral valve regurgitation is trivial by color flow Doppler. Tricuspid Valve: The tricuspid valve is normal in structure. Tricuspid valve regurgitation is mild by color flow Doppler. Aortic Valve: The aortic valve was not well visualized Aortic valve regurgitation was not visualized by color flow Doppler. Pulmonic Valve: The pulmonic valve was normal in structure. Pulmonic valve regurgitation is not visualized by color flow Doppler. Venous: The inferior vena cava is dilated in size with less than 50% respiratory variability.  LEFT VENTRICLE PLAX 2D (Teich) LV EF:          73.9 %   Diastology LVIDd:          4.61 cm  LV e' medial: 8.05 cm/s LVIDs:          2.64 cm LV PW:          0.99  cm LV IVS:         0.80 cm LVOT diam:      2.00 cm LV SV:          72 ml LVOT Area:      3.14 cm  RIGHT VENTRICLE RV S prime:     11.40 cm/s TAPSE (M-mode): 1.1 cm RVSP:           29.7 mmHg  LEFT ATRIUM             Index       RIGHT ATRIUM           Index LA diam:        4.60 cm 2.01 cm/m  RA Pressure: 15 mmHg LA Vol (A2C):   46.1 ml 20.13 ml/m RA Area:     12.30 cm LA Vol (A4C):   50.3 ml 21.97 ml/m RA Volume:   21.60 ml  9.43 ml/m LA Biplane Vol: 52.7 ml 23.01 ml/m AORTIC VALVE LVOT Vmax:   96.27 cm/s LVOT Vmean:  64.400 cm/s LVOT VTI:    0.156 m  AORTA Ao Root diam: 2.90 cm  TRICUSPID VALVE TR Peak grad:   14.7 mmHg TR Vmax:        192.00 cm/s RVSP:           29.7 mmHg   Chilton Si MD Electronically signed by Chilton Si MD Signature Date/Time: 12/20/2018/12:00:35 PM    Final          ______________________________________________________________________________________________      Risk Assessment/Calculations:    CHA2DS2-VASc Score = 3   This indicates a 3.2% annual risk of stroke. The patient's score is based upon: CHF History: 0 HTN History: 1 Diabetes History: 0 Stroke History: 0 Vascular Disease History: 0 Age Score: 2 Gender Score: 0           Physical Exam:   VS:  BP 128/66   Pulse (!) 49   Ht 6' (1.829 m)   Wt 210 lb 12.8 oz (95.6 kg)   SpO2 99%   BMI 28.59 kg/m    Wt Readings from Last 3 Encounters:  01/11/24 210 lb 12.8 oz (95.6 kg)  11/16/23 215 lb (97.5 kg)  04/08/23 208 lb 1.6 oz (94.4 kg)    GEN: Well nourished, well developed in no acute distress NECK: No JVD; No carotid bruits CARDIAC: RRR, no murmurs, rubs, gallops RESPIRATORY:  Clear to auscultation without rales, wheezing or rhonchi  ABDOMEN: Soft, non-tender, non-distended EXTREMITIES:  No edema; No deformity   ASSESSMENT AND PLAN: .    Preoperative clearance for colonoscopy Requires clearance for low-risk colonoscopy. Atrial fibrillation and  pericardial effusion stable. No ischemic workup needed unless symptoms develop. -Colonoscopy procedure is low risk and he is at acceptable risk to proceed from the cardiac perspective. - Reduce metoprolol succinate to half a tablet daily. - Hold Eliquis for two days prior to the procedure. -Will defer to GI physician to decide on the earliest time to restart Eliquis after the procedure based on the bleeding risk.  Atrial fibrillation Longstanding persistent atrial fibrillation, asymptomatic, heart rate 49 bpm. Likely secondary to previous pericardial effusion. - Adjust metoprolol succinate to half a tablet daily.  Hypertension: Blood pressure well-controlled  Pericardial effusion status post pericardial window Status post subxiphoid pericardial window in 2020. Asymptomatic, associated with atrial fibrillation. - Continue current management.       Dispo: Follow-up with Dr. Antoine Poche in 1 year  Signed, Azalee Course, [place]

## 2024-01-11 NOTE — Patient Instructions (Signed)
 Medication Instructions:  HOLD ELIQUIS 2 DAYS PRIOR TO PROCEDURE *If you need a refill on your cardiac medications before your next appointment, please call your pharmacy*   Lab Work: NO LABS If you have labs (blood work) drawn today and your tests are completely normal, you will receive your results only by: MyChart Message (if you have MyChart) OR A paper copy in the mail If you have any lab test that is abnormal or we need to change your treatment, we will call you to review the results.   Testing/Procedures: NO TESTING   Follow-Up: At [facility], you and your health needs are our priority.  As part of our continuing mission to provide you with exceptional heart care, we have created designated Provider Care Teams.  These Care Teams include your primary Cardiologist (physician) and Advanced Practice Providers (APPs -  Physician Assistants and Nurse Practitioners) who all work together to provide you with the care you need, when you need it.  Your next appointment:   1 year(s)  Provider:   Rollene Rotunda, MD   Other Instructions

## 2024-01-17 ENCOUNTER — Telehealth: Payer: Self-pay | Admitting: Cardiology

## 2024-01-17 DIAGNOSIS — I4811 Longstanding persistent atrial fibrillation: Secondary | ICD-10-CM

## 2024-01-17 MED ORDER — APIXABAN 5 MG PO TABS
5.0000 mg | ORAL_TABLET | Freq: Two times a day (BID) | ORAL | Status: DC
Start: 2024-01-17 — End: 2024-06-14

## 2024-01-17 NOTE — Telephone Encounter (Signed)
Returned call to patient left message to call back. 

## 2024-01-17 NOTE — Telephone Encounter (Signed)
 Spoke with patient's sister, Ian Estes ([place]). Patient previously submitted a patient assistance application earlier this year, but it was denied due to his income. Per previous documentation in 12/13/23 telephone encounter:  [name], RPH-CPP     12/13/23  1:01 PM Note He has a $250 deductible, then his cost would be ~$130 because he has to pay 25% He wont be approved for pt assistance until he has spent 3% of household income on OOP rx expense.    I called and reviewed with sister (per DPR)   We also discussed warfarin and dabigatran. She will discuss with her brother and will let us know.     Ian Estes reports that the pt wishes to stay on Eliquis long-term and will make it work financially. In the short-term, she is requesting assistance with samples. Offered to place two weeks worth of Eliquis 5mg  samples at [employer] front desk for pickup. [name] verbalizes appreciation and denies additional questions at this time.

## 2024-01-17 NOTE — Telephone Encounter (Signed)
 Patient calling the office for samples of medication:   1.  What medication and dosage are you requesting samples for? ELIQUIS 5 MG TABS tablet   2.  Are you currently out of this medication? Yes pt has one left

## 2024-01-17 NOTE — Telephone Encounter (Signed)
 Patient is returning call.

## 2024-01-26 DIAGNOSIS — K5730 Diverticulosis of large intestine without perforation or abscess without bleeding: Secondary | ICD-10-CM | POA: Diagnosis not present

## 2024-01-26 DIAGNOSIS — K648 Other hemorrhoids: Secondary | ICD-10-CM | POA: Diagnosis not present

## 2024-01-26 DIAGNOSIS — Z1211 Encounter for screening for malignant neoplasm of colon: Secondary | ICD-10-CM | POA: Diagnosis not present

## 2024-01-26 DIAGNOSIS — Z85038 Personal history of other malignant neoplasm of large intestine: Secondary | ICD-10-CM | POA: Diagnosis not present

## 2024-01-26 DIAGNOSIS — K635 Polyp of colon: Secondary | ICD-10-CM | POA: Diagnosis not present

## 2024-01-26 DIAGNOSIS — D123 Benign neoplasm of transverse colon: Secondary | ICD-10-CM | POA: Diagnosis not present

## 2024-01-26 DIAGNOSIS — Z860101 Personal history of adenomatous and serrated colon polyps: Secondary | ICD-10-CM | POA: Diagnosis not present

## 2024-01-26 DIAGNOSIS — Z860100 Personal history of colon polyps, unspecified: Secondary | ICD-10-CM | POA: Diagnosis not present

## 2024-02-01 DIAGNOSIS — W1830XA Fall on same level, unspecified, initial encounter: Secondary | ICD-10-CM | POA: Diagnosis not present

## 2024-02-01 DIAGNOSIS — R109 Unspecified abdominal pain: Secondary | ICD-10-CM | POA: Diagnosis not present

## 2024-02-02 DIAGNOSIS — S299XXA Unspecified injury of thorax, initial encounter: Secondary | ICD-10-CM | POA: Diagnosis not present

## 2024-02-02 DIAGNOSIS — S2241XA Multiple fractures of ribs, right side, initial encounter for closed fracture: Secondary | ICD-10-CM | POA: Diagnosis not present

## 2024-02-02 DIAGNOSIS — S41111A Laceration without foreign body of right upper arm, initial encounter: Secondary | ICD-10-CM | POA: Diagnosis not present

## 2024-02-02 DIAGNOSIS — S41112A Laceration without foreign body of left upper arm, initial encounter: Secondary | ICD-10-CM | POA: Diagnosis not present

## 2024-02-02 DIAGNOSIS — M25522 Pain in left elbow: Secondary | ICD-10-CM | POA: Diagnosis not present

## 2024-02-02 DIAGNOSIS — S3991XA Unspecified injury of abdomen, initial encounter: Secondary | ICD-10-CM | POA: Diagnosis not present

## 2024-02-02 DIAGNOSIS — R0781 Pleurodynia: Secondary | ICD-10-CM | POA: Diagnosis not present

## 2024-02-02 DIAGNOSIS — W19XXXA Unspecified fall, initial encounter: Secondary | ICD-10-CM | POA: Diagnosis not present

## 2024-02-02 DIAGNOSIS — L989 Disorder of the skin and subcutaneous tissue, unspecified: Secondary | ICD-10-CM | POA: Diagnosis not present

## 2024-02-02 DIAGNOSIS — S3993XA Unspecified injury of pelvis, initial encounter: Secondary | ICD-10-CM | POA: Diagnosis not present

## 2024-02-02 DIAGNOSIS — N281 Cyst of kidney, acquired: Secondary | ICD-10-CM | POA: Diagnosis not present

## 2024-02-02 DIAGNOSIS — S59902A Unspecified injury of left elbow, initial encounter: Secondary | ICD-10-CM | POA: Diagnosis not present

## 2024-02-02 DIAGNOSIS — M7989 Other specified soft tissue disorders: Secondary | ICD-10-CM | POA: Diagnosis not present

## 2024-02-02 DIAGNOSIS — S2231XA Fracture of one rib, right side, initial encounter for closed fracture: Secondary | ICD-10-CM | POA: Diagnosis not present

## 2024-03-09 DIAGNOSIS — C182 Malignant neoplasm of ascending colon: Secondary | ICD-10-CM | POA: Diagnosis not present

## 2024-03-15 ENCOUNTER — Other Ambulatory Visit: Payer: Self-pay

## 2024-03-15 DIAGNOSIS — C182 Malignant neoplasm of ascending colon: Secondary | ICD-10-CM

## 2024-03-16 ENCOUNTER — Other Ambulatory Visit: Payer: Self-pay | Admitting: Nurse Practitioner

## 2024-03-16 ENCOUNTER — Inpatient Hospital Stay: Payer: Medicare HMO | Admitting: Nurse Practitioner

## 2024-03-16 ENCOUNTER — Inpatient Hospital Stay: Payer: Medicare HMO

## 2024-03-16 DIAGNOSIS — C182 Malignant neoplasm of ascending colon: Secondary | ICD-10-CM

## 2024-03-16 LAB — SIGNATERA
SIGNATERA MTM READOUT: 0 MTM/ml
SIGNATERA TEST RESULT: NEGATIVE

## 2024-03-16 NOTE — Assessment & Plan Note (Deleted)
 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  Found to have a right colon mass on imaging -Colonoscopy 12/11/22 by Dr. Nickey Barn confirmed adenocarcinoma.  Baseline CEA was normal 4.5 and CT negative for distant metastasis -S/p ap converted to open right hemicolectomy by Dr. Leighton Punches 12/15/2022, surgical path showed invasive grade 2-3 adenocarcinoma measuring 6.0 x 4.0 x 2.7 cm with negative lymph nodes and clear margins.  Lymphovascular invasion was present, but no PNI or perforation -Due to rising CEA,, he underwent CT scan of abdomen and pelvis with contrast on 11/29/2022, which showed a new mesentery node, concerning for metastatic recurrence.  His enlarged mediastinal and hilar adenopathy are unchanged, likely reactive. -His new mesenteric lymph node is difficult to biopsy, A PET scan was recommended for further evaluation. -reviewed PET scan with radiologist, and discussed the findings with patient and his sister, his hypermetabolic thoracic adenopathy likely reactive, no other definitive evidence of for cancer recurrence, will continue monitoring.   -the treatment option of immunotherapy, such as Keytruda, due to his MSI high disease.  Due to his advanced age and medical comorbidities, he is not a good candidate for cytotoxic chemotherapy. -signaterra test negative CT CAP done 11/06/2023 showing resolution of mesenteric lymph nodes. Stability and slight improvement of mediastinal and hilar lymph nodes. No evidence of new lymphadenopathy or metastatic disease in chest, abdomen, or pelvis.  -repeat CT CAP in one year.  -follow up colonoscopy recommended to be done in 11/2023. Will attach Dr. Tova Fresh to progress note to schedule with patient

## 2024-03-16 NOTE — Progress Notes (Deleted)
 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  Found to have a right colon mass on imaging -Colonoscopy 12/11/22 by Dr. Nickey Barn confirmed adenocarcinoma.  Baseline CEA was normal 4.5 and CT negative for distant metastasis -S/p ap converted to open right hemicolectomy by Dr. Leighton Punches 12/15/2022, surgical path showed invasive grade 2-3 adenocarcinoma measuring 6.0 x 4.0 x 2.7 cm with negative lymph nodes and clear margins.  Lymphovascular invasion was present, but no PNI or perforation -Due to rising CEA,, he underwent CT scan of abdomen and pelvis with contrast on 11/29/2022, which showed a new mesentery node, concerning for metastatic recurrence.  His enlarged mediastinal and hilar adenopathy are unchanged, likely reactive. -His new mesenteric lymph node is difficult to biopsy, A PET scan was recommended for further evaluation. -reviewed PET scan with radiologist, and discussed the findings with patient and his sister, his hypermetabolic thoracic adenopathy likely reactive, no other definitive evidence of for cancer recurrence, will continue monitoring.   -the treatment option of immunotherapy, such as Keytruda, due to his MSI high disease.  Due to his advanced age and medical comorbidities, he is not a good candidate for  cytotoxic chemotherapy. -signaterra test negative CT CAP done 11/06/2023 showing resolution of mesenteric lymph nodes. Stability and slight improvement of mediastinal and hilar lymph nodes. No evidence of new lymphadenopathy or metastatic disease in chest, abdomen, or pelvis.  -repeat CT CAP in one year.  -follow up colonoscopy recommended to be done in 11/2023. Will attach Dr. Tova Fresh to progress note to schedule with patient     The patient understands the plans discussed today and is in agreement with them.  He knows to contact our office if he develops concerns prior to his next appointment.  I provided *** minutes of face-to-face time during this encounter and > 50% was spent counseling as documented under my assessment and plan.    Sharyon Deis, NP  [facility] - A DEPT OF [facility] [address] [place] [address] Dept: [phone] Dept Fax: [phone]   No orders of the defined types were placed in this encounter.     CHIEF COMPLAINT:  CC: cancer of right colon   Current Treatment:  surveillance   INTERVAL HISTORY:  Ian Estes is here today for repeat clinical assessment. Patient was last seen by me on 11/16/2023. Most recent CT CAP done 10/2023 showed resolved mesenteric lymph nodes and diminished hilar and mediastinal nodes.  There is no evidence of metastatic disease or lymphadenopathy in the chest abdomen or pelvis.  Repeat is expected in January 2026.  He denies fevers or chills. He denies pain. His appetite is good. His weight {Weight change:10426}.  I have reviewed the past medical history, past surgical history, social history and family history with the patient and they are unchanged from previous note.  ALLERGIES:  has no known allergies.  MEDICATIONS:  Current Outpatient Medications  Medication Sig Dispense Refill   apixaban  (ELIQUIS ) 5 MG TABS tablet Take 1 tablet (5 mg total) by mouth 2  (two) times daily. 28 tablet    ELIQUIS  5 MG TABS tablet TAKE 1 TABLET BY MOUTH TWICE A DAY 60 tablet 5   metoprolol  succinate (TOPROL -XL) 25 MG 24 hr tablet Take 0.5 tablets (12.5 mg total) by mouth daily.     polyethylene glycol (MIRALAX  / GLYCOLAX ) 17 g packet Take 17 g by mouth daily. 14 each 0   senna-docusate (SENOKOT-S) 8.6-50 MG tablet Take 1 tablet by mouth 2 (two) times daily. 30 tablet 0   TYLENOL  500 MG tablet Take 1,000 mg by mouth every 6 (six) hours as needed for mild pain.     No current facility-administered medications for this visit.    HISTORY OF PRESENT ILLNESS:   Oncology History Overview Note   Cancer Staging  Cancer of right colon [facility]) Staging form: Colon and Rectum, AJCC 8th Edition - Pathologic: Stage IIA (pT3, pN0, cM0) - Signed by Sonja Petersburg, MD on 04/08/2023 Total positive nodes: 0 Histologic grading system: 4 grade system Histologic grade (G): G2 Residual tumor (R): R0 - None     Cancer of right colon (HCC)  04/08/2023 Initial Diagnosis   Cancer of right colon (HCC)   04/08/2023 Cancer Staging   Staging form: Colon and Rectum, AJCC 8th Edition - Pathologic: Stage IIA (pT3, pN0, cM0) - Signed by Sonja [place], MD on 04/08/2023 Total positive nodes: 0 Histologic grading system: 4 grade system Histologic grade (G): G2 Residual tumor (R): R0 - None   11/06/2023 Imaging   CT Chest abdomen and pelvis with contrast IMPRESSION: 1. Status post partial right hemicolectomy and ileocolic anastomosis. 2. Previously noted enlarged mesenteric lymph nodes are resolved. Similarly enlarged portacaval lymph node. 3. Slightly diminished prominence of mediastinal and hilar lymph nodes. Above-noted lymph nodes were not previously FDG avid. 4. No evidence of new lymphadenopathy or metastatic disease in the chest, abdomen, or pelvis. 5. 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 6. Cardiomegaly and coronary artery disease.       REVIEW OF SYSTEMS:    Constitutional: Denies fevers, chills or abnormal weight loss Eyes: Denies blurriness of vision Ears, nose, mouth, throat, and face: Denies mucositis or sore throat Respiratory: Denies cough, dyspnea or wheezes Cardiovascular: Denies palpitation, chest discomfort or lower extremity swelling Gastrointestinal:  Denies nausea, heartburn or change in bowel habits Skin: Denies abnormal skin rashes Lymphatics: Denies new lymphadenopathy or easy bruising Neurological:Denies numbness, tingling or new weaknesses Behavioral/Psych: Mood is stable, no new changes  All other systems were reviewed with the patient and are negative.   VITALS:   There were no vitals taken for this visit.  Wt Readings from Last 3 Encounters:  01/11/24 210 lb 12.8 oz (95.6 kg)  11/16/23 215 lb (97.5 kg)  04/08/23 208 lb 1.6 oz (94.4 kg)    There is no height or weight on file to calculate BMI.  Performance status (ECOG): {CHL ONC [id number]  PHYSICAL EXAM:   GENERAL:alert, no distress and comfortable SKIN: skin color, texture, turgor are normal, no rashes or significant lesions EYES: normal, Conjunctiva are pink and non-injected, sclera clear OROPHARYNX:no exudate, no erythema and lips, buccal mucosa, and tongue normal  NECK: supple, thyroid  normal size, non-tender, without nodularity LYMPH:  no palpable lymphadenopathy in the cervical, axillary or inguinal LUNGS: clear to auscultation and percussion with normal  breathing effort HEART: regular rate & rhythm and no murmurs and no lower extremity edema ABDOMEN:abdomen soft, non-tender and normal bowel sounds Musculoskeletal:no cyanosis of digits and no clubbing  NEURO: alert & oriented x 3 with fluent speech, no focal motor/sensory deficits  LABORATORY DATA:  I have reviewed the data as listed    Component Value Date/Time   NA 137 11/16/2023 0758   NA 138 01/12/2019 0852   K 4.7 11/16/2023 0758   CL 103 11/16/2023 0758   CO2 26 11/16/2023 0758    GLUCOSE 94 11/16/2023 0758   BUN 19 11/16/2023 0758   BUN 20 01/12/2019 0852   CREATININE 1.33 (H) 11/16/2023 0758   CALCIUM  9.3 11/16/2023 0758   PROT 7.0 11/16/2023 0758   ALBUMIN 3.7 11/16/2023 0758   AST 28 11/16/2023 0758   ALT 27 11/16/2023 0758   ALKPHOS 84 11/16/2023 0758   BILITOT 0.8 11/16/2023 0758   GFRNONAA 53 (L) 11/16/2023 0758   GFRAA 63 01/12/2019 0852    No results found for: "SPEP", "UPEP"  Lab Results  Component Value Date   WBC 6.8 11/16/2023   NEUTROABS 4.6 11/16/2023   HGB 16.0 11/16/2023   HCT 48.8 11/16/2023   MCV 97.2 11/16/2023   PLT 146 (L) 11/16/2023      Chemistry      Component Value Date/Time   NA 137 11/16/2023 0758   NA 138 01/12/2019 0852   K 4.7 11/16/2023 0758   CL 103 11/16/2023 0758   CO2 26 11/16/2023 0758   BUN 19 11/16/2023 0758   BUN 20 01/12/2019 0852   CREATININE 1.33 (H) 11/16/2023 0758      Component Value Date/Time   CALCIUM  9.3 11/16/2023 0758   ALKPHOS 84 11/16/2023 0758   AST 28 11/16/2023 0758   ALT 27 11/16/2023 0758   BILITOT 0.8 11/16/2023 0758       RADIOGRAPHIC STUDIES: I have personally reviewed the radiological images as listed and agreed with the findings in the report. No results found.

## 2024-03-22 ENCOUNTER — Telehealth: Payer: Self-pay | Admitting: Nurse Practitioner

## 2024-04-06 NOTE — Progress Notes (Addendum)
 Patient Care Team: Debrah Josette ORN., PA-C as PCP - General (Family Medicine) Lavona Agent, MD as PCP - Cardiology (Cardiology) Kristie Lamprey, MD as Consulting Physician (Gastroenterology) Tanda Locus, MD as Consulting Physician (General Surgery) Sheldon Standing, MD as Consulting Physician (Colon and Rectal Surgery) Lanny Callander, MD as Consulting Physician (Hematology and Oncology)  Clinic Day:  04/07/2024  Referring physician: Debrah Josette ORN., PA-C  ASSESSMENT & PLAN:   Assessment & Plan: Cancer of right colon [facility]) Adenocarcinoma of ascending colon, 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  Found to have a right colon mass on imaging -Colonoscopy 12/11/22 by Dr. Rollin confirmed adenocarcinoma.  Baseline CEA was normal 4.5 and CT negative for distant metastasis -S/p ap converted to open right hemicolectomy by Dr. Dasie 12/15/2022, surgical path showed invasive grade 2-3 adenocarcinoma measuring 6.0 x 4.0 x 2.7 cm with negative lymph nodes and clear margins.  Lymphovascular invasion was present, but no PNI or perforation -Due to rising CEA,, he underwent CT scan of abdomen and pelvis with contrast on 11/29/2022, which showed a new mesentery node, concerning for metastatic recurrence.  His enlarged mediastinal and hilar adenopathy are unchanged, likely reactive. -His new mesenteric lymph node is difficult to biopsy, A PET scan was recommended for further evaluation. -reviewed PET scan with radiologist, and discussed the findings with patient and his sister, his hypermetabolic thoracic adenopathy likely reactive, no other definitive evidence of for cancer recurrence, will continue monitoring.   -the treatment option of immunotherapy, such as Keytruda, due to his MSI high disease.  Due to his advanced age and medical comorbidities, he is not a good candidate for  cytotoxic chemotherapy. -signaterra test negative CT CAP done 11/06/2023 showing resolution of mesenteric lymph nodes. Stability and slight improvement of mediastinal and hilar lymph nodes. No evidence of new lymphadenopathy or metastatic disease in chest, abdomen, or pelvis.  -repeat CT CAP in one year ( 10/2024). -follow up colonoscopy.  Will request results/progress note from Dr. Kristie. -ctDNA Signatera done 03/09/2024 with negative results. -- Subsequent ctDNA Signatera due in August 2025. Continue colon cancer surveillance with labs and follow-up in 4 months, sooner if needed.   Chronic constipation Patient states that he takes off brand senokot to prevent constipation. This continues to work well.   Plan: Labs reviewed. - Mild elevation of Hgb at 17.1.  Remainder of CBC unremarkable. - CMP showing mildly decreased kidney functions, with remainder of CMP unremarkable. - CEA stable at 10.03 -PSA <0.1. He did have colonoscopy since our last visit. Will request progress notes from Dr. Kristie to review.  Initial ctDNA Signatera negative. -Next ctDNA Signatera set for 05/2024.  Labs and follow up in 4 months, sooner if needed     The patient understands the plans discussed today and is in agreement with them.  He knows to contact our office if he develops concerns prior to his next appointment.  I provided 25 minutes of face-to-face time during this encounter and > 50% was spent counseling as documented under my assessment and plan.    [name] Lessen, NP  [facility] ONC - A DEPT OF [facility] [address] [place] [address] Dept: [phone] Dept Fax: [phone]   No orders of the defined types were placed in this encounter.     CHIEF COMPLAINT:  CC: Cancer  of right colon  Current Treatment: Surveillance  INTERVAL HISTORY:  Ian Estes is here today for repeat clinical assessment.  The patient was last  seen by myself on 11/16/2023.  He had recently fallen at home and suffered rib fractures which are now healed.  Recent ctDNA Signatera test (03/09/2024) was negative.  He states his appetite is good.  He denies abdominal pain.  Experiences intermittent constipation for which she takes Senokot.  He denies new concerns or complaints.  He denies chest pain, chest pressure, or shortness of breath. He denies headaches or visual disturbances. He denies abdominal pain, nausea, vomiting, or changes in bowel or bladder habits.   He denies fevers or chills. He denies pain. His weight has been stable.  I have reviewed the past medical history, past surgical history, social history and family history with the patient and they are unchanged from previous note.  ALLERGIES:  has no known allergies.  MEDICATIONS:  Current Outpatient Medications  Medication Sig Dispense Refill   apixaban  (ELIQUIS ) 5 MG TABS tablet Take 1 tablet (5 mg total) by mouth 2 (two) times daily. 28 tablet    ELIQUIS  5 MG TABS tablet TAKE 1 TABLET BY MOUTH TWICE A DAY 60 tablet 5   metoprolol  succinate (TOPROL -XL) 25 MG 24 hr tablet Take 0.5 tablets (12.5 mg total) by mouth daily.     polyethylene glycol (MIRALAX  / GLYCOLAX ) 17 g packet Take 17 g by mouth daily. 14 each 0   senna-docusate (SENOKOT-S) 8.6-50 MG tablet Take 1 tablet by mouth 2 (two) times daily. 30 tablet 0   TYLENOL  500 MG tablet Take 1,000 mg by mouth every 6 (six) hours as needed for mild pain.     No current facility-administered medications for this visit.    HISTORY OF PRESENT ILLNESS:   Oncology History Overview Note   Cancer Staging  Cancer of right colon [facility]) Staging form: Colon and Rectum, AJCC 8th Edition - Pathologic: Stage IIA (pT3, pN0, cM0) - Signed by Lanny Callander, MD on 04/08/2023 Total positive nodes: 0 Histologic grading system: 4 grade system Histologic grade (G): G2 Residual tumor (R): R0 - None     Cancer of right colon (HCC)  04/08/2023 Initial  Diagnosis   Cancer of right colon (HCC)   04/08/2023 Cancer Staging   Staging form: Colon and Rectum, AJCC 8th Edition - Pathologic: Stage IIA (pT3, pN0, cM0) - Signed by Lanny Callander, MD on 04/08/2023 Total positive nodes: 0 Histologic grading system: 4 grade system Histologic grade (G): G2 Residual tumor (R): R0 - None   11/06/2023 Imaging   CT Chest abdomen and pelvis with contrast IMPRESSION: 1. Status post partial right hemicolectomy and ileocolic anastomosis. 2. Previously noted enlarged mesenteric lymph nodes are resolved. Similarly enlarged portacaval lymph node. 3. Slightly diminished prominence of mediastinal and hilar lymph nodes. Above-noted lymph nodes were not previously FDG avid. 4. No evidence of new lymphadenopathy or metastatic disease in the chest, abdomen, or pelvis. 5. 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 6. Cardiomegaly and coronary artery disease.       REVIEW OF SYSTEMS:   Constitutional: Denies fevers, chills or abnormal weight loss Eyes: Denies blurriness of vision Ears, nose, mouth, throat, and face: Denies mucositis or sore throat Respiratory: Denies cough, dyspnea or wheezes Cardiovascular: Denies palpitation, chest discomfort or lower extremity swelling Gastrointestinal:  Denies nausea, heartburn or change in bowel habits Skin: Denies abnormal skin rashes Lymphatics: Denies new lymphadenopathy or easy bruising Neurological:Denies numbness, tingling or  new weaknesses Behavioral/Psych: Mood is stable, no new changes  All other systems were reviewed with the patient and are negative.   VITALS:   Today's Vitals   04/07/24 0839 04/07/24 0841  BP: 132/72   Pulse: 70   Resp: 16   Temp: (!) 97.2 F (36.2 C)   TempSrc: Temporal   SpO2: 99%   Weight: 211 lb 3.2 oz (95.8 kg)   Height: 6' (1.829 m)   PainSc:  0-No pain   Body mass index is 28.64 kg/m.   Wt Readings from Last 3 Encounters:  04/07/24 211 lb 3.2 oz (95.8 kg)   01/11/24 210 lb 12.8 oz (95.6 kg)  11/16/23 215 lb (97.5 kg)    Body mass index is 28.64 kg/m.  Performance status (ECOG): 1 - Symptomatic but completely ambulatory  PHYSICAL EXAM:   GENERAL:alert, no distress and comfortable SKIN: skin color, texture, turgor are normal, no rashes or significant lesions EYES: normal, Conjunctiva are pink and non-injected, sclera clear OROPHARYNX:no exudate, no erythema and lips, buccal mucosa, and tongue normal  NECK: supple, thyroid  normal size, non-tender, without nodularity LYMPH:  no palpable lymphadenopathy in the cervical, axillary or inguinal LUNGS: clear to auscultation and percussion with normal breathing effort HEART: regular rate & rhythm and no murmurs and no lower extremity edema ABDOMEN:abdomen soft, non-tender and normal bowel sounds Musculoskeletal:no cyanosis of digits and no clubbing  NEURO: alert & oriented x 3 with fluent speech, no focal motor/sensory deficits  LABORATORY DATA:  I have reviewed the data as listed    Component Value Date/Time   NA 136 04/07/2024 0748   NA 138 01/12/2019 0852   K 5.0 04/07/2024 0748   CL 104 04/07/2024 0748   CO2 26 04/07/2024 0748   GLUCOSE 101 (H) 04/07/2024 0748   BUN 18 04/07/2024 0748   BUN 20 01/12/2019 0852   CREATININE 1.45 (H) 04/07/2024 0748   CALCIUM  9.5 04/07/2024 0748   PROT 7.5 04/07/2024 0748   ALBUMIN 4.1 04/07/2024 0748   AST 24 04/07/2024 0748   ALT 15 04/07/2024 0748   ALKPHOS 99 04/07/2024 0748   BILITOT 0.9 04/07/2024 0748   GFRNONAA 48 (L) 04/07/2024 0748   GFRAA 63 01/12/2019 0852     Lab Results  Component Value Date   WBC 6.1 04/07/2024   NEUTROABS 3.9 04/07/2024   HGB 17.1 (H) 04/07/2024   HCT 51.0 04/07/2024   MCV 97.0 04/07/2024   PLT 193 04/07/2024

## 2024-04-06 NOTE — Assessment & Plan Note (Addendum)
 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  Found to have a right colon mass on imaging -Colonoscopy 12/11/22 by Dr. Rollin confirmed adenocarcinoma.  Baseline CEA was normal 4.5 and CT negative for distant metastasis -S/p ap converted to open right hemicolectomy by Dr. Dasie 12/15/2022, surgical path showed invasive grade 2-3 adenocarcinoma measuring 6.0 x 4.0 x 2.7 cm with negative lymph nodes and clear margins.  Lymphovascular invasion was present, but no PNI or perforation -Due to rising CEA,, he underwent CT scan of abdomen and pelvis with contrast on 11/29/2022, which showed a new mesentery node, concerning for metastatic recurrence.  His enlarged mediastinal and hilar adenopathy are unchanged, likely reactive. -His new mesenteric lymph node is difficult to biopsy, A PET scan was recommended for further evaluation. -reviewed PET scan with radiologist, and discussed the findings with patient and his sister, his hypermetabolic thoracic adenopathy likely reactive, no other definitive evidence of for cancer recurrence, will continue monitoring.   -the treatment option of immunotherapy, such as Keytruda, due to his MSI high disease.  Due to his advanced age and medical comorbidities, he is not a good candidate for cytotoxic chemotherapy. -signaterra test negative CT CAP done 11/06/2023 showing resolution of mesenteric lymph nodes. Stability and slight improvement of mediastinal and hilar lymph nodes. No evidence of new lymphadenopathy or metastatic disease in chest, abdomen, or pelvis.  -repeat CT CAP in one year ( 10/2024). -follow up colonoscopy.  Will request results/progress note from Dr. Kristie. -ctDNA Signatera done 03/09/2024 with negative results. -- Subsequent ctDNA Signatera due in August 2025. Continue colon cancer surveillance with  labs and follow-up in 4 months, sooner if needed.

## 2024-04-07 ENCOUNTER — Telehealth: Payer: Self-pay

## 2024-04-07 ENCOUNTER — Inpatient Hospital Stay: Attending: Nurse Practitioner

## 2024-04-07 ENCOUNTER — Inpatient Hospital Stay (HOSPITAL_BASED_OUTPATIENT_CLINIC_OR_DEPARTMENT_OTHER): Admitting: Nurse Practitioner

## 2024-04-07 VITALS — BP 132/72 | HR 70 | Temp 97.2°F | Resp 16 | Ht 72.0 in | Wt 211.2 lb

## 2024-04-07 DIAGNOSIS — C182 Malignant neoplasm of ascending colon: Secondary | ICD-10-CM | POA: Diagnosis not present

## 2024-04-07 DIAGNOSIS — Z9049 Acquired absence of other specified parts of digestive tract: Secondary | ICD-10-CM | POA: Insufficient documentation

## 2024-04-07 DIAGNOSIS — Z8546 Personal history of malignant neoplasm of prostate: Secondary | ICD-10-CM | POA: Insufficient documentation

## 2024-04-07 DIAGNOSIS — K5909 Other constipation: Secondary | ICD-10-CM | POA: Diagnosis not present

## 2024-04-07 LAB — CBC WITH DIFFERENTIAL (CANCER CENTER ONLY)
Abs Immature Granulocytes: 0.02 10*3/uL (ref 0.00–0.07)
Basophils Absolute: 0.1 10*3/uL (ref 0.0–0.1)
Basophils Relative: 1 %
Eosinophils Absolute: 0.1 10*3/uL (ref 0.0–0.5)
Eosinophils Relative: 2 %
HCT: 51 % (ref 39.0–52.0)
Hemoglobin: 17.1 g/dL — ABNORMAL HIGH (ref 13.0–17.0)
Immature Granulocytes: 0 %
Lymphocytes Relative: 21 %
Lymphs Abs: 1.3 10*3/uL (ref 0.7–4.0)
MCH: 32.5 pg (ref 26.0–34.0)
MCHC: 33.5 g/dL (ref 30.0–36.0)
MCV: 97 fL (ref 80.0–100.0)
Monocytes Absolute: 0.7 10*3/uL (ref 0.1–1.0)
Monocytes Relative: 12 %
Neutro Abs: 3.9 10*3/uL (ref 1.7–7.7)
Neutrophils Relative %: 64 %
Platelet Count: 193 10*3/uL (ref 150–400)
RBC: 5.26 MIL/uL (ref 4.22–5.81)
RDW: 13.2 % (ref 11.5–15.5)
WBC Count: 6.1 10*3/uL (ref 4.0–10.5)
nRBC: 0 % (ref 0.0–0.2)

## 2024-04-07 LAB — CMP (CANCER CENTER ONLY)
ALT: 15 U/L (ref 0–44)
AST: 24 U/L (ref 15–41)
Albumin: 4.1 g/dL (ref 3.5–5.0)
Alkaline Phosphatase: 99 U/L (ref 38–126)
Anion gap: 6 (ref 5–15)
BUN: 18 mg/dL (ref 8–23)
CO2: 26 mmol/L (ref 22–32)
Calcium: 9.5 mg/dL (ref 8.9–10.3)
Chloride: 104 mmol/L (ref 98–111)
Creatinine: 1.45 mg/dL — ABNORMAL HIGH (ref 0.61–1.24)
GFR, Estimated: 48 mL/min — ABNORMAL LOW (ref >60)
Glucose, Bld: 101 mg/dL — ABNORMAL HIGH (ref 70–99)
Potassium: 5 mmol/L (ref 3.5–5.1)
Sodium: 136 mmol/L (ref 135–145)
Total Bilirubin: 0.9 mg/dL (ref 0.0–1.2)
Total Protein: 7.5 g/dL (ref 6.5–8.1)

## 2024-04-07 LAB — CEA (ACCESS): CEA (CHCC): 10.03 ng/mL — ABNORMAL HIGH (ref 0.00–5.00)

## 2024-04-07 NOTE — Telephone Encounter (Signed)
 Reached out to [facility]: Tami Falcon Nat MD T#[phone], per Amaryllis Junior requesting patient's recent colonoscopy report and office visit note. Faxed written fax cover sheet request to F#[phone]. Confirmation received.  Report received via fax and given to Rande Bushy, NP.

## 2024-04-08 LAB — PROSTATE-SPECIFIC AG, SERUM (LABCORP): Prostate Specific Ag, Serum: <0.1 ng/mL (ref 0.0–4.0)

## 2024-04-10 ENCOUNTER — Encounter: Payer: Self-pay | Admitting: Gastroenterology

## 2024-04-10 ENCOUNTER — Telehealth: Payer: Self-pay | Admitting: Nurse Practitioner

## 2024-04-10 NOTE — Telephone Encounter (Signed)
 Scheduled appointments per 6/20 los. Called and left a VM with appointment details.

## 2024-04-23 ENCOUNTER — Encounter: Payer: Self-pay | Admitting: Nurse Practitioner

## 2024-04-25 DIAGNOSIS — M25562 Pain in left knee: Secondary | ICD-10-CM | POA: Diagnosis not present

## 2024-04-25 DIAGNOSIS — G4700 Insomnia, unspecified: Secondary | ICD-10-CM | POA: Diagnosis not present

## 2024-04-25 DIAGNOSIS — E119 Type 2 diabetes mellitus without complications: Secondary | ICD-10-CM | POA: Diagnosis not present

## 2024-04-25 DIAGNOSIS — E7800 Pure hypercholesterolemia, unspecified: Secondary | ICD-10-CM | POA: Diagnosis not present

## 2024-04-25 DIAGNOSIS — N1831 Chronic kidney disease, stage 3a: Secondary | ICD-10-CM | POA: Diagnosis not present

## 2024-04-25 DIAGNOSIS — K5909 Other constipation: Secondary | ICD-10-CM | POA: Diagnosis not present

## 2024-04-25 DIAGNOSIS — M25561 Pain in right knee: Secondary | ICD-10-CM | POA: Diagnosis not present

## 2024-04-25 DIAGNOSIS — I129 Hypertensive chronic kidney disease with stage 1 through stage 4 chronic kidney disease, or unspecified chronic kidney disease: Secondary | ICD-10-CM | POA: Diagnosis not present

## 2024-04-25 DIAGNOSIS — I700 Atherosclerosis of aorta: Secondary | ICD-10-CM | POA: Diagnosis not present

## 2024-04-25 DIAGNOSIS — M109 Gout, unspecified: Secondary | ICD-10-CM | POA: Diagnosis not present

## 2024-04-25 DIAGNOSIS — I4819 Other persistent atrial fibrillation: Secondary | ICD-10-CM | POA: Diagnosis not present

## 2024-04-25 DIAGNOSIS — E785 Hyperlipidemia, unspecified: Secondary | ICD-10-CM | POA: Diagnosis not present

## 2024-04-25 DIAGNOSIS — C189 Malignant neoplasm of colon, unspecified: Secondary | ICD-10-CM | POA: Diagnosis not present

## 2024-05-09 ENCOUNTER — Telehealth: Payer: Self-pay | Admitting: Pharmacy Technician

## 2024-05-09 NOTE — Telephone Encounter (Signed)
 Received notification from [employer] that Eliquis  application was denied due to:

## 2024-06-01 DIAGNOSIS — M25561 Pain in right knee: Secondary | ICD-10-CM | POA: Diagnosis not present

## 2024-06-01 DIAGNOSIS — M174 Other bilateral secondary osteoarthritis of knee: Secondary | ICD-10-CM | POA: Diagnosis not present

## 2024-06-01 DIAGNOSIS — M25562 Pain in left knee: Secondary | ICD-10-CM | POA: Diagnosis not present

## 2024-06-01 DIAGNOSIS — G8929 Other chronic pain: Secondary | ICD-10-CM | POA: Diagnosis not present

## 2024-06-14 ENCOUNTER — Other Ambulatory Visit: Payer: Self-pay | Admitting: Cardiology

## 2024-06-14 NOTE — Telephone Encounter (Signed)
 Pt last saw Scot Ford, [place] on 01/11/24, last labs 04/07/24 Creat 1.45, age 83, weight 95.8kg, based on specified criteria pt is on appropriate dosage of Eliquis  5mg  BID for afib.  Will refill rx.

## 2024-06-15 DIAGNOSIS — C182 Malignant neoplasm of ascending colon: Secondary | ICD-10-CM | POA: Diagnosis not present

## 2024-06-27 LAB — SIGNATERA
SIGNATERA MTM READOUT: 0 MTM/ml
SIGNATERA TEST RESULT: NEGATIVE

## 2024-07-13 DIAGNOSIS — L603 Nail dystrophy: Secondary | ICD-10-CM | POA: Diagnosis not present

## 2024-07-13 DIAGNOSIS — G8929 Other chronic pain: Secondary | ICD-10-CM | POA: Diagnosis not present

## 2024-07-13 DIAGNOSIS — M79671 Pain in right foot: Secondary | ICD-10-CM | POA: Diagnosis not present

## 2024-07-13 DIAGNOSIS — E119 Type 2 diabetes mellitus without complications: Secondary | ICD-10-CM | POA: Diagnosis not present

## 2024-07-13 DIAGNOSIS — M79674 Pain in right toe(s): Secondary | ICD-10-CM | POA: Diagnosis not present

## 2024-07-13 DIAGNOSIS — L84 Corns and callosities: Secondary | ICD-10-CM | POA: Diagnosis not present

## 2024-07-13 DIAGNOSIS — M79675 Pain in left toe(s): Secondary | ICD-10-CM | POA: Diagnosis not present

## 2024-07-13 DIAGNOSIS — M79672 Pain in left foot: Secondary | ICD-10-CM | POA: Diagnosis not present

## 2024-07-13 DIAGNOSIS — Z7409 Other reduced mobility: Secondary | ICD-10-CM | POA: Diagnosis not present

## 2024-07-13 DIAGNOSIS — Z789 Other specified health status: Secondary | ICD-10-CM | POA: Diagnosis not present

## 2024-08-06 ENCOUNTER — Other Ambulatory Visit: Payer: Self-pay | Admitting: Nurse Practitioner

## 2024-08-06 DIAGNOSIS — C182 Malignant neoplasm of ascending colon: Secondary | ICD-10-CM

## 2024-08-06 NOTE — Progress Notes (Unsigned)
 Patient Care Team: Debrah Josette ORN., PA-C as PCP - General (Family Medicine) Lavona Agent, MD as PCP - Cardiology (Cardiology) Kristie Lamprey, MD as Consulting Physician (Gastroenterology) Tanda Locus, MD as Consulting Physician (General Surgery) Sheldon Standing, MD as Consulting Physician (Colon and Rectal Surgery) Lanny Callander, MD as Consulting Physician (Hematology and Oncology)  Clinic Day:  08/07/2024  Referring physician: Debrah Josette ORN., PA-C  ASSESSMENT & PLAN:   Assessment & Plan: Cancer of right colon [facility]) Adenocarcinoma of ascending colon, 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  Found to have a right colon mass on imaging -Colonoscopy 12/11/22 by Dr. Rollin confirmed adenocarcinoma.  Baseline CEA was normal 4.5 and CT negative for distant metastasis -S/p ap converted to open right hemicolectomy by Dr. Dasie 12/15/2022, surgical path showed invasive grade 2-3 adenocarcinoma measuring 6.0 x 4.0 x 2.7 cm with negative lymph nodes and clear margins.  Lymphovascular invasion was present, but no PNI or perforation -Due to rising CEA,, he underwent CT scan of abdomen and pelvis with contrast on 11/29/2022, which showed a new mesentery node, concerning for metastatic recurrence.  His enlarged mediastinal and hilar adenopathy are unchanged, likely reactive. -His new mesenteric lymph node is difficult to biopsy, A PET scan was recommended for further evaluation. -reviewed PET scan with radiologist, and discussed the findings with patient and his sister, his hypermetabolic thoracic adenopathy likely reactive, no other definitive evidence of for cancer recurrence, will continue monitoring.   -the treatment option of immunotherapy, such as Keytruda, due to his MSI high disease.  Due to his advanced age and medical comorbidities, he is not a good candidate for  cytotoxic chemotherapy. -signaterra test negative CT CAP done 11/06/2023 showing resolution of mesenteric lymph nodes. Stability and slight improvement of mediastinal and hilar lymph nodes. No evidence of new lymphadenopathy or metastatic disease in chest, abdomen, or pelvis.  -repeat CT CAP due end of December/beginning of January 2026. Order has been placed. Patient to call and schedule.  -follow up colonoscopy done 01/26/2024. He had diverticulosis in sigmoid colon. There were 2 small polyps in the transverse colon and 1 small polyp in the sigmoid colon. He had moderate internal hemorrhoids. Biopsy consistent with tubular adenoma. Repeat study due on 01/2027.  -ctDNA Signatera done 03/09/2024 with negative results. -- Subsequent ctDNA Signatera done 06/27/2024 with negative results.  Continue colon cancer surveillance with labs and follow-up in 4 months, sooner if needed.    Plan:  Labs reviewed. -CBC and CMP stable and unremarkable.  -CEA pending.  ctDNA signatera in 06/27/2024 was negative.  CT CAP ordered and will be scheduled for 09/2024 or 10/2024.  Continue cancer surveillance with labs and follow up in 4 months, sooner if needed.   The patient understands the plans discussed today and is in agreement with them.  He knows to contact our office if he develops concerns prior to his next appointment.  I provided 25 minutes of face-to-face time during this encounter and > 50% was spent counseling as documented under my assessment and plan.    Ian FORBES Lessen, NP  [facility] - A DEPT OF [facility] [address] [place] [address] Dept: [phone] Dept Fax: [phone]   No orders of the defined types were placed in this encounter.     CHIEF COMPLAINT:  CC: Cancer  right colon  Current Treatment: Surveillance  INTERVAL HISTORY:  Ian Estes is here today for repeat clinical assessment.  He last saw me on  04/07/2024.  Signatera test for ctDNA on 06/27/2024 was negative.  He is due for CT CAP in late December 2025 or early January 2026. This has been ordered and patient's sister will schedule. He denies chest pain, chest pressure, or shortness of breath. He denies headaches or visual disturbances. He denies abdominal pain, nausea, vomiting, or changes in bowel or bladder habits.  He denies presence of blood in his stool or hematemesis.  He denies fevers or chills. He denies pain. His appetite is good. His weight has been stable.  I have reviewed the past medical history, past surgical history, social history and family history with the patient and they are unchanged from previous note.  ALLERGIES:  has no known allergies.  MEDICATIONS:  Current Outpatient Medications  Medication Sig Dispense Refill   ELIQUIS  5 MG TABS tablet TAKE 1 TABLET BY MOUTH TWICE A DAY 60 tablet 5   metoprolol  succinate (TOPROL -XL) 25 MG 24 hr tablet Take 0.5 tablets (12.5 mg total) by mouth daily.     polyethylene glycol (MIRALAX  / GLYCOLAX ) 17 g packet Take 17 g by mouth daily. 14 each 0   senna-docusate (SENOKOT-S) 8.6-50 MG tablet Take 1 tablet by mouth 2 (two) times daily. 30 tablet 0   TYLENOL  500 MG tablet Take 1,000 mg by mouth every 6 (six) hours as needed for mild pain.     No current facility-administered medications for this visit.    HISTORY OF PRESENT ILLNESS:   Oncology History Overview Note   Cancer Staging  Cancer of right colon [facility]) Staging form: Colon and Rectum, AJCC 8th Edition - Pathologic: Stage IIA (pT3, pN0, cM0) - Signed by Lanny Callander, MD on 04/08/2023 Total positive nodes: 0 Histologic grading system: 4 grade system Histologic grade (G): G2 Residual tumor (R): R0 - None     Cancer of right colon (HCC)  04/08/2023 Initial Diagnosis   Cancer of right colon (HCC)   04/08/2023 Cancer Staging   Staging form: Colon and Rectum, AJCC 8th Edition - Pathologic: Stage IIA (pT3, pN0, cM0) - Signed  by Lanny Callander, MD on 04/08/2023 Total positive nodes: 0 Histologic grading system: 4 grade system Histologic grade (G): G2 Residual tumor (R): R0 - None   11/06/2023 Imaging   CT Chest abdomen and pelvis with contrast IMPRESSION: 1. Status post partial right hemicolectomy and ileocolic anastomosis. 2. Previously noted enlarged mesenteric lymph nodes are resolved. Similarly enlarged portacaval lymph node. 3. Slightly diminished prominence of mediastinal and hilar lymph nodes. Above-noted lymph nodes were not previously FDG avid. 4. No evidence of new lymphadenopathy or metastatic disease in the chest, abdomen, or pelvis. 5. 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 6. Cardiomegaly and coronary artery disease.       REVIEW OF SYSTEMS:   Constitutional: Denies fevers, chills or abnormal weight loss Eyes: Denies blurriness of vision Ears, nose, mouth, throat, and face: Denies mucositis or sore throat Respiratory: Denies cough, dyspnea or wheezes Cardiovascular: Denies palpitation, chest discomfort or lower extremity swelling Gastrointestinal:  Denies nausea, heartburn or change in bowel habits Skin: Denies abnormal skin rashes Lymphatics: Denies new lymphadenopathy or easy bruising Neurological:Denies numbness, tingling or new weaknesses Behavioral/Psych: Mood is stable, no new changes  Musculoskeletal: patient has pain in bilateral knees. He sees orthopedics for this.  All other systems were reviewed with the patient and  are negative.   VITALS:   Today's Vitals   08/07/24 0919 08/07/24 0944  BP: 138/88   Pulse: 66   Resp: 17   Temp: 98.3 F (36.8 C)   SpO2: 97%   Weight: 210 lb 1.6 oz (95.3 kg)   PainSc:  0-No pain   Body mass index is 28.49 kg/m.   Wt Readings from Last 3 Encounters:  08/07/24 210 lb 1.6 oz (95.3 kg)  04/07/24 211 lb 3.2 oz (95.8 kg)  01/11/24 210 lb 12.8 oz (95.6 kg)    Body mass index is 28.49 kg/m.  Performance status  (ECOG): 0 - Asymptomatic  PHYSICAL EXAM:   GENERAL:alert, no distress and comfortable SKIN: skin color, texture, turgor are normal, no rashes or significant lesions EYES: normal, Conjunctiva are pink and non-injected, sclera clear OROPHARYNX:no exudate, no erythema and lips, buccal mucosa, and tongue normal  NECK: supple, thyroid  normal size, non-tender, without nodularity LYMPH:  no palpable lymphadenopathy in the cervical, axillary or inguinal LUNGS: clear to auscultation and percussion with normal breathing effort HEART: regular rate & rhythm and no murmurs and no lower extremity edema ABDOMEN:abdomen soft, non-tender and normal bowel sounds Musculoskeletal:no cyanosis of digits and no clubbing  NEURO: alert & oriented x 3 with fluent speech, no focal motor/sensory deficits  LABORATORY DATA:  I have reviewed the data as listed    Component Value Date/Time   NA 136 08/07/2024 0833   NA 138 01/12/2019 0852   K 4.4 08/07/2024 0833   CL 104 08/07/2024 0833   CO2 26 08/07/2024 0833   GLUCOSE 93 08/07/2024 0833   BUN 19 08/07/2024 0833   BUN 20 01/12/2019 0852   CREATININE 1.35 (H) 08/07/2024 0833   CALCIUM  9.4 08/07/2024 0833   PROT 7.0 08/07/2024 0833   ALBUMIN 3.8 08/07/2024 0833   AST 24 08/07/2024 0833   ALT 21 08/07/2024 0833   ALKPHOS 77 08/07/2024 0833   BILITOT 0.9 08/07/2024 0833   GFRNONAA 52 (L) 08/07/2024 0833   GFRAA 63 01/12/2019 0852    Lab Results  Component Value Date   WBC 7.3 08/07/2024   NEUTROABS 4.9 08/07/2024   HGB 16.9 08/07/2024   HCT 50.3 08/07/2024   MCV 95.4 08/07/2024   PLT 168 08/07/2024

## 2024-08-06 NOTE — Assessment & Plan Note (Signed)
 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  Found to have a right colon mass on imaging -Colonoscopy 12/11/22 by Dr. Rollin confirmed adenocarcinoma.  Baseline CEA was normal 4.5 and CT negative for distant metastasis -S/p ap converted to open right hemicolectomy by Dr. Dasie 12/15/2022, surgical path showed invasive grade 2-3 adenocarcinoma measuring 6.0 x 4.0 x 2.7 cm with negative lymph nodes and clear margins.  Lymphovascular invasion was present, but no PNI or perforation -Due to rising CEA,, he underwent CT scan of abdomen and pelvis with contrast on 11/29/2022, which showed a new mesentery node, concerning for metastatic recurrence.  His enlarged mediastinal and hilar adenopathy are unchanged, likely reactive. -His new mesenteric lymph node is difficult to biopsy, A PET scan was recommended for further evaluation. -reviewed PET scan with radiologist, and discussed the findings with patient and his sister, his hypermetabolic thoracic adenopathy likely reactive, no other definitive evidence of for cancer recurrence, will continue monitoring.   -the treatment option of immunotherapy, such as Keytruda, due to his MSI high disease.  Due to his advanced age and medical comorbidities, he is not a good candidate for cytotoxic chemotherapy. -signaterra test negative CT CAP done 11/06/2023 showing resolution of mesenteric lymph nodes. Stability and slight improvement of mediastinal and hilar lymph nodes. No evidence of new lymphadenopathy or metastatic disease in chest, abdomen, or pelvis.  -repeat CT CAP in one year ( 10/2024). -follow up colonoscopy.  Will request results/progress note from Dr. Kristie. -ctDNA Signatera done 03/09/2024 with negative results. -- Subsequent ctDNA Signatera due in August 2025. Continue colon cancer surveillance with  labs and follow-up in 4 months, sooner if needed.

## 2024-08-07 ENCOUNTER — Inpatient Hospital Stay (HOSPITAL_BASED_OUTPATIENT_CLINIC_OR_DEPARTMENT_OTHER): Admitting: Nurse Practitioner

## 2024-08-07 ENCOUNTER — Encounter: Payer: Self-pay | Admitting: Nurse Practitioner

## 2024-08-07 ENCOUNTER — Ambulatory Visit: Payer: Self-pay | Admitting: Nurse Practitioner

## 2024-08-07 ENCOUNTER — Ambulatory Visit: Admitting: Nurse Practitioner

## 2024-08-07 ENCOUNTER — Inpatient Hospital Stay: Attending: Nurse Practitioner

## 2024-08-07 ENCOUNTER — Other Ambulatory Visit

## 2024-08-07 VITALS — BP 138/88 | HR 66 | Temp 98.3°F | Resp 17 | Wt 210.1 lb

## 2024-08-07 DIAGNOSIS — C182 Malignant neoplasm of ascending colon: Secondary | ICD-10-CM | POA: Diagnosis not present

## 2024-08-07 DIAGNOSIS — Z8546 Personal history of malignant neoplasm of prostate: Secondary | ICD-10-CM | POA: Insufficient documentation

## 2024-08-07 DIAGNOSIS — Z9049 Acquired absence of other specified parts of digestive tract: Secondary | ICD-10-CM | POA: Diagnosis not present

## 2024-08-07 LAB — CBC WITH DIFFERENTIAL (CANCER CENTER ONLY)
Abs Immature Granulocytes: 0.05 K/uL (ref 0.00–0.07)
Basophils Absolute: 0.1 K/uL (ref 0.0–0.1)
Basophils Relative: 1 %
Eosinophils Absolute: 0.1 K/uL (ref 0.0–0.5)
Eosinophils Relative: 1 %
HCT: 50.3 % (ref 39.0–52.0)
Hemoglobin: 16.9 g/dL (ref 13.0–17.0)
Immature Granulocytes: 1 %
Lymphocytes Relative: 19 %
Lymphs Abs: 1.4 K/uL (ref 0.7–4.0)
MCH: 32.1 pg (ref 26.0–34.0)
MCHC: 33.6 g/dL (ref 30.0–36.0)
MCV: 95.4 fL (ref 80.0–100.0)
Monocytes Absolute: 0.8 K/uL (ref 0.1–1.0)
Monocytes Relative: 12 %
Neutro Abs: 4.9 K/uL (ref 1.7–7.7)
Neutrophils Relative %: 66 %
Platelet Count: 168 K/uL (ref 150–400)
RBC: 5.27 MIL/uL (ref 4.22–5.81)
RDW: 13.5 % (ref 11.5–15.5)
WBC Count: 7.3 K/uL (ref 4.0–10.5)
nRBC: 0 % (ref 0.0–0.2)

## 2024-08-07 LAB — CMP (CANCER CENTER ONLY)
ALT: 21 U/L (ref 0–44)
AST: 24 U/L (ref 15–41)
Albumin: 3.8 g/dL (ref 3.5–5.0)
Alkaline Phosphatase: 77 U/L (ref 38–126)
Anion gap: 6 (ref 5–15)
BUN: 19 mg/dL (ref 8–23)
CO2: 26 mmol/L (ref 22–32)
Calcium: 9.4 mg/dL (ref 8.9–10.3)
Chloride: 104 mmol/L (ref 98–111)
Creatinine: 1.35 mg/dL — ABNORMAL HIGH (ref 0.61–1.24)
GFR, Estimated: 52 mL/min — ABNORMAL LOW (ref >60)
Glucose, Bld: 93 mg/dL (ref 70–99)
Potassium: 4.4 mmol/L (ref 3.5–5.1)
Sodium: 136 mmol/L (ref 135–145)
Total Bilirubin: 0.9 mg/dL (ref 0.0–1.2)
Total Protein: 7 g/dL (ref 6.5–8.1)

## 2024-08-07 LAB — CEA (ACCESS): CEA (CHCC): 15.42 ng/mL — ABNORMAL HIGH (ref 0.00–5.00)

## 2024-08-10 ENCOUNTER — Telehealth: Payer: Self-pay

## 2024-08-10 NOTE — Telephone Encounter (Signed)
 Called patient to relay message below as per Powell Lessen NP, family voiced full understanding and had no further questions at this time. Scan has been moved up to 11/04 as per [facility] request and patient is aware.    Will you let the patient know about the message above? Do you know if I have to place a new order if I want the CT CAP to be in the next few weeks, rather than December or January.  Thanks.  Karyle Lessen [name], NP to Ian Estes     08/07/24  2:55 PM Good afternoon.  I received your CEA (tumor marker) is slightly elevated from previous checks. I would like to go ahead and schedule your CT scan for sooner, rather than waiting until December. If you are ok with this, I will place a new order or have the radiology schedulers reach out to you to set this up.  Karyle, NP

## 2024-08-18 ENCOUNTER — Other Ambulatory Visit: Payer: Self-pay

## 2024-08-22 ENCOUNTER — Ambulatory Visit (HOSPITAL_COMMUNITY)
Admission: RE | Admit: 2024-08-22 | Discharge: 2024-08-22 | Disposition: A | Discharge status: Home / Self Care | Source: Ambulatory Visit | Attending: Nurse Practitioner | Admitting: Nurse Practitioner

## 2024-08-22 ENCOUNTER — Encounter (HOSPITAL_COMMUNITY): Payer: Self-pay

## 2024-08-22 DIAGNOSIS — I700 Atherosclerosis of aorta: Secondary | ICD-10-CM | POA: Diagnosis not present

## 2024-08-22 DIAGNOSIS — C189 Malignant neoplasm of colon, unspecified: Secondary | ICD-10-CM | POA: Diagnosis not present

## 2024-08-22 DIAGNOSIS — C182 Malignant neoplasm of ascending colon: Secondary | ICD-10-CM | POA: Insufficient documentation

## 2024-08-22 DIAGNOSIS — K429 Umbilical hernia without obstruction or gangrene: Secondary | ICD-10-CM | POA: Diagnosis not present

## 2024-08-22 DIAGNOSIS — K5730 Diverticulosis of large intestine without perforation or abscess without bleeding: Secondary | ICD-10-CM | POA: Diagnosis not present

## 2024-08-22 MED ORDER — IOHEXOL 9 MG/ML PO SOLN
ORAL | Status: AC
  Filled 2024-08-22: qty 1000

## 2024-08-22 MED ORDER — IOHEXOL 300 MG/ML  SOLN
100.0000 mL | Freq: Once | INTRAMUSCULAR | Status: AC | PRN
  Administered 2024-08-22: 75 mL via INTRAVENOUS

## 2024-08-22 MED ORDER — IOHEXOL 9 MG/ML PO SOLN
1000.0000 mL | ORAL | Status: AC
  Administered 2024-08-22: 1000 mL via ORAL

## 2024-08-28 ENCOUNTER — Other Ambulatory Visit: Payer: Self-pay

## 2024-08-28 NOTE — Assessment & Plan Note (Signed)
 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  Found to have a right colon mass on imaging -Colonoscopy 12/11/22 by Dr. Rollin confirmed adenocarcinoma.  Baseline CEA was normal 4.5 and CT negative for distant metastasis -S/p ap converted to open right hemicolectomy by Dr. Dasie 12/15/2022, surgical path showed invasive grade 2-3 adenocarcinoma measuring 6.0 x 4.0 x 2.7 cm with negative lymph nodes and clear margins.  Lymphovascular invasion was present, but no PNI or perforation -Due to rising CEA,, he underwent CT scan of abdomen and pelvis with contrast on 11/29/2022, which showed a new mesentery node, concerning for metastatic recurrence.  His enlarged mediastinal and hilar adenopathy are unchanged, likely reactive. -His new mesenteric lymph node is difficult to biopsy, I recommend a PET scan for further evaluation. -reviewed PET scan with radiologist, and discussed the findings with patient and his sister, his hypermetabolic thoracic adenopathy likely reactive, no other definitive evidence of for cancer recurrence, will continue monitoring.   -I previously discussed treatment option of immunotherapy if needed, such as Keytruda, due to his MSI high disease.  Due to his advanced age and medical comorbidities, he is not a good candidate for cytotoxic chemotherapy. -continue surveillance. CT 08/22/2024 showed no evidence of recurrence

## 2024-08-29 ENCOUNTER — Inpatient Hospital Stay: Attending: Nurse Practitioner | Admitting: Hematology

## 2024-08-29 DIAGNOSIS — C182 Malignant neoplasm of ascending colon: Secondary | ICD-10-CM

## 2024-08-29 DIAGNOSIS — Z85038 Personal history of other malignant neoplasm of large intestine: Secondary | ICD-10-CM | POA: Diagnosis not present

## 2024-08-29 NOTE — Progress Notes (Signed)
 [facility]   [phone] [phone]   Clinic Follow up Note   Patient Care Team: Debrah Josette ORN., PA-C as PCP - General (Family Medicine) Lavona Agent, MD as PCP - Cardiology (Cardiology) Kristie Lamprey, MD as Consulting Physician (Gastroenterology) Tanda Locus, MD as Consulting Physician (General Surgery) Sheldon Standing, MD as Consulting Physician (Colon and Rectal Surgery) Lanny Callander, MD as Consulting Physician (Hematology and Oncology) 08/29/2024  I connected with Ian Estes on 08/29/24 at  8:15 AM EST by telephone and verified that I am speaking with the correct person using two identifiers.   I discussed the limitations, risks, security and privacy concerns of performing an evaluation and management service by telephone and the availability of in person appointments. I also discussed with the patient that there may be a patient responsible charge related to this service. The patient expressed understanding and agreed to proceed.   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  Found to have a right colon mass on imaging -Colonoscopy 12/11/22 by Dr. Rollin confirmed adenocarcinoma.  Baseline CEA was normal 4.5 and CT negative for distant metastasis -S/p ap converted to open right hemicolectomy by Dr. Dasie 12/15/2022, surgical path showed invasive grade 2-3 adenocarcinoma measuring 6.0 x 4.0 x 2.7 cm with negative lymph nodes and clear margins.  Lymphovascular invasion was present, but no PNI or perforation -Due to rising CEA,, he underwent CT scan of abdomen and pelvis with contrast on 11/29/2022, which  showed a new mesentery node, concerning for metastatic recurrence.  His enlarged mediastinal and hilar adenopathy are unchanged, likely reactive. -His new mesenteric lymph node is difficult to biopsy, I recommend a PET scan for further evaluation. -reviewed PET scan with radiologist, and discussed the findings with patient and his sister, his hypermetabolic thoracic adenopathy likely reactive, no other definitive evidence of for cancer recurrence, will continue monitoring.   -I previously discussed treatment option of immunotherapy if needed, such as Keytruda, due to his MSI high disease.  Due to his advanced age and medical comorbidities, he is not a good candidate for cytotoxic chemotherapy. -continue surveillance. CT 08/22/2024 showed no evidence of recurrence   Assessment & Plan History of stage II colon cancer, status post resection, under active surveillance Stage II colon cancer diagnosed in February 2024, status post resection. Recent CT scan shows no evidence of recurrence, only surgical changes. Previously noted enlarged lymph nodes have improved, likely benign and reactive. Signatera test in September was negative for circulating tumor DNA. Risk of recurrence is mild to moderate, with the highest risk in the first two to three years post-diagnosis. Current surveillance is appropriate given the time elapsed since diagnosis and negative findings. - Continue active surveillance with CT scan at the end of 2026. - Schedule follow-up appointments every four months with lab tests.  Plan - I personally reviewed his surveillance CT scan, which showed no evidence of cancer recurrence. - Will continue cancer surveillance.  Lab and follow-up in 3 months   SUMMARY OF ONCOLOGIC HISTORY: Oncology History Overview Note   Cancer Staging  Cancer of right colon [facility]) Staging form: Colon and Rectum, AJCC 8th Edition - Pathologic: Stage IIA (pT3, pN0, cM0) -  Signed by Lanny Callander, MD on 04/08/2023 Total  positive nodes: 0 Histologic grading system: 4 grade system Histologic grade (G): G2 Residual tumor (R): R0 - None     Cancer of right colon (HCC)  04/08/2023 Initial Diagnosis   Cancer of right colon (HCC)   04/08/2023 Cancer Staging   Staging form: Colon and Rectum, AJCC 8th Edition - Pathologic: Stage IIA (pT3, pN0, cM0) - Signed by Lanny Callander, MD on 04/08/2023 Total positive nodes: 0 Histologic grading system: 4 grade system Histologic grade (G): G2 Residual tumor (R): R0 - None   11/06/2023 Imaging   CT Chest abdomen and pelvis with contrast IMPRESSION: 1. Status post partial right hemicolectomy and ileocolic anastomosis. 2. Previously noted enlarged mesenteric lymph nodes are resolved. Similarly enlarged portacaval lymph node. 3. Slightly diminished prominence of mediastinal and hilar lymph nodes. Above-noted lymph nodes were not previously FDG avid. 4. No evidence of new lymphadenopathy or metastatic disease in the chest, abdomen, or pelvis. 5. 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 6. Cardiomegaly and coronary artery disease.     Discussed the use of AI scribe software for clinical note transcription with the patient, who gave verbal consent to proceed.  History of Present Illness Ian Estes is an 83 year old male with colon cancer who presents for follow-up and review of surveillance CT scan. He is accompanied by his sister, Ian Estes.  He was diagnosed with stage II colon cancer in February 2024 and underwent colon surgery. He has been under surveillance since then. A recent CT scan showed some surgical changes, and previously enlarged lymph nodes were observed last year. A PET scan last year showed no concerning findings. A Signatera test in September was negative for tumor DNA.  He feels well overall with no new symptoms in the past month. He is not on any medications related to his cancer treatment.     REVIEW OF SYSTEMS:   Constitutional:  Denies fevers, chills or abnormal weight loss Eyes: Denies blurriness of vision Ears, nose, mouth, throat, and face: Denies mucositis or sore throat Respiratory: Denies cough, dyspnea or wheezes Cardiovascular: Denies palpitation, chest discomfort or lower extremity swelling Gastrointestinal:  Denies nausea, heartburn or change in bowel habits Skin: Denies abnormal skin rashes Lymphatics: Denies new lymphadenopathy or easy bruising Neurological:Denies numbness, tingling or new weaknesses Behavioral/Psych: Mood is stable, no new changes  All other systems were reviewed with the patient and are negative.  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   Diverticulitis    Gout    High cholesterol    Hypertension    Obesity    Pneumonia 2014 X 1   Prostate cancer (HCC)    Type II diabetes mellitus (HCC)     SURGICAL HISTORY: Past Surgical History:  Procedure Laterality Date   BIOPSY  12/11/2022   Procedure: BIOPSY;  Surgeon: Rollin Dover, MD;  Location: [facility];  Service: Gastroenterology;;   COLON RESECTION N/A 12/15/2022   Procedure: LAPAROSCOPIC HAND ASSISTED RIGHT HEMI COLECTOMY, POSSIBLE OPEN;  Surgeon: Dasie Leonor CROME, MD;  Location: [facility];  Service: General;  Laterality: N/A;   COLONOSCOPY WITH PROPOFOL  N/A 12/11/2022   Procedure: COLONOSCOPY WITH PROPOFOL ;  Surgeon: Rollin Dover, MD;  Location: [facility];  Service: Gastroenterology;  Laterality: N/A;   INCISION AND DRAINAGE PERIRECTAL ABSCESS  1990's  hemorrhoid   KNEE ARTHROSCOPY Right ~ 2011   POLYPECTOMY  12/11/2022   Procedure: POLYPECTOMY;  Surgeon: Rollin Dover, MD;  Location: [facility];  Service: Gastroenterology;;   PROSTATE SURGERY  1996   SKIN CANCER EXCISION  ~ 2005 X 2   left foot   SUBMUCOSAL TATTOO INJECTION  12/11/2022   Procedure: SUBMUCOSAL TATTOO INJECTION;   Surgeon: Rollin Dover, MD;  Location: [facility];  Service: Gastroenterology;;   SUBXYPHOID PERICARDIAL WINDOW N/A 12/21/2018   Procedure: SUBXYPHOID PERICARDIAL WINDOW;  Surgeon: Fleeta Hanford Coy, MD;  Location: [facility];  Service: Thoracic;  Laterality: N/A;   TEE WITHOUT CARDIOVERSION N/A 12/21/2018   Procedure: TRANSESOPHAGEAL ECHOCARDIOGRAM (TEE);  Surgeon: Fleeta Hanford, Coy, MD;  Location: [facility];  Service: Thoracic;  Laterality: N/A;    I have reviewed the social history and family history with the patient and they are unchanged from previous note.  ALLERGIES:  has no known allergies.  MEDICATIONS:  Current Outpatient Medications  Medication Sig Dispense Refill   ELIQUIS  5 MG TABS tablet TAKE 1 TABLET BY MOUTH TWICE A DAY 60 tablet 5   metoprolol  succinate (TOPROL -XL) 25 MG 24 hr tablet Take 0.5 tablets (12.5 mg total) by mouth daily.     polyethylene glycol (MIRALAX  / GLYCOLAX ) 17 g packet Take 17 g by mouth daily. 14 each 0   senna-docusate (SENOKOT-S) 8.6-50 MG tablet Take 1 tablet by mouth 2 (two) times daily. 30 tablet 0   TYLENOL  500 MG tablet Take 1,000 mg by mouth every 6 (six) hours as needed for mild pain.     No current facility-administered medications for this visit.    PHYSICAL EXAMINATION: Not performed   LABORATORY DATA:  I have reviewed the data as listed    Latest Ref Rng & Units 08/07/2024    8:33 AM 04/07/2024    7:48 AM 11/16/2023    7:58 AM  CBC  WBC 4.0 - 10.5 K/uL 7.3  6.1  6.8   Hemoglobin 13.0 - 17.0 g/dL 83.0  82.8  83.9   Hematocrit 39.0 - 52.0 % 50.3  51.0  48.8   Platelets 150 - 400 K/uL 168  193  146         Latest Ref Rng & Units 08/07/2024    8:33 AM 04/07/2024    7:48 AM 11/16/2023    7:58 AM  CMP  Glucose 70 - 99 mg/dL 93  898  94   BUN 8 - 23 mg/dL 19  18  19    Creatinine 0.61 - 1.24 mg/dL 8.64  8.54  8.66   Sodium 135 - 145 mmol/L 136  136  137   Potassium 3.5 - 5.1 mmol/L 4.4  5.0  4.7   Chloride 98 - 111 mmol/L 104  104  103    CO2 22 - 32 mmol/L 26  26  26    Calcium  8.9 - 10.3 mg/dL 9.4  9.5  9.3   Total Protein 6.5 - 8.1 g/dL 7.0  7.5  7.0   Total Bilirubin 0.0 - 1.2 mg/dL 0.9  0.9  0.8   Alkaline Phos 38 - 126 U/L 77  99  84   AST 15 - 41 U/L 24  24  28    ALT 0 - 44 U/L 21  15  27        RADIOGRAPHIC STUDIES: I have personally reviewed the radiological images as listed and agreed with the findings in the report. No results found.     I  discussed the assessment and treatment plan with the patient. The patient was provided an opportunity to ask questions and all were answered. The patient agreed with the plan and demonstrated an understanding of the instructions.   The patient was advised to call back or seek an in-person evaluation if the symptoms worsen or if the condition fails to improve as anticipated.  I provided 15 minutes of non face-to-face telephone visit time during this encounter, including review of chart and various tests results, discussions about plan of care and coordination of care plan.    Onita Mattock, MD 08/29/24

## 2024-10-09 DIAGNOSIS — E119 Type 2 diabetes mellitus without complications: Secondary | ICD-10-CM | POA: Diagnosis not present

## 2024-10-09 DIAGNOSIS — E7800 Pure hypercholesterolemia, unspecified: Secondary | ICD-10-CM | POA: Diagnosis not present

## 2024-10-16 ENCOUNTER — Other Ambulatory Visit (HOSPITAL_COMMUNITY)

## 2024-11-29 ENCOUNTER — Inpatient Hospital Stay: Admitting: Hematology

## 2024-11-29 ENCOUNTER — Inpatient Hospital Stay: Attending: Nurse Practitioner

## 2024-11-29 ENCOUNTER — Inpatient Hospital Stay
